# Patient Record
Sex: Male | Born: 1940 | Race: White | Hispanic: No | Smoking: Former smoker
Health system: Southern US, Community
[De-identification: ages and names within clinical notes are randomized; demographics above are authoritative.]

## PROBLEM LIST (undated history)

## (undated) DIAGNOSIS — I2510 Atherosclerotic heart disease of native coronary artery without angina pectoris: Secondary | ICD-10-CM

## (undated) DIAGNOSIS — G609 Hereditary and idiopathic neuropathy, unspecified: Secondary | ICD-10-CM

## (undated) DIAGNOSIS — G4700 Insomnia, unspecified: Secondary | ICD-10-CM

## (undated) DIAGNOSIS — I480 Paroxysmal atrial fibrillation: Secondary | ICD-10-CM

## (undated) DIAGNOSIS — N400 Enlarged prostate without lower urinary tract symptoms: Secondary | ICD-10-CM

## (undated) DIAGNOSIS — I119 Hypertensive heart disease without heart failure: Secondary | ICD-10-CM

## (undated) DIAGNOSIS — E559 Vitamin D deficiency, unspecified: Secondary | ICD-10-CM

## (undated) DIAGNOSIS — E785 Hyperlipidemia, unspecified: Secondary | ICD-10-CM

## (undated) DIAGNOSIS — I10 Essential (primary) hypertension: Secondary | ICD-10-CM

## (undated) HISTORY — DX: Essential (primary) hypertension: I10

## (undated) HISTORY — DX: Insomnia, unspecified: G47.00

## (undated) HISTORY — DX: Hereditary and idiopathic neuropathy, unspecified: G60.9

## (undated) HISTORY — DX: Hypertensive heart disease without heart failure: I11.9

## (undated) HISTORY — DX: Paroxysmal atrial fibrillation: I48.0

## (undated) HISTORY — PX: JOINT REPLACEMENT: SHX530

## (undated) HISTORY — DX: Atherosclerotic heart disease of native coronary artery without angina pectoris: I25.10

## (undated) HISTORY — DX: Hyperlipidemia, unspecified: E78.5

## (undated) HISTORY — DX: Vitamin D deficiency, unspecified: E55.9

## (undated) HISTORY — DX: Benign prostatic hyperplasia without lower urinary tract symptoms: N40.0

## (undated) HISTORY — PX: NECK SURGERY: SHX720

---

## 1998-05-02 ENCOUNTER — Inpatient Hospital Stay (HOSPITAL_COMMUNITY): Admission: EM | Admit: 1998-05-02 | Discharge: 1998-05-04 | Payer: Self-pay | Admitting: Emergency Medicine

## 2001-09-03 HISTORY — PX: CARDIAC CATHETERIZATION: SHX172

## 2009-04-26 ENCOUNTER — Emergency Department: Payer: Self-pay | Admitting: Emergency Medicine

## 2011-09-15 ENCOUNTER — Observation Stay: Payer: Self-pay | Admitting: Internal Medicine

## 2011-09-15 DIAGNOSIS — G609 Hereditary and idiopathic neuropathy, unspecified: Secondary | ICD-10-CM | POA: Diagnosis not present

## 2011-09-15 DIAGNOSIS — Z8249 Family history of ischemic heart disease and other diseases of the circulatory system: Secondary | ICD-10-CM | POA: Diagnosis not present

## 2011-09-15 DIAGNOSIS — S79919A Unspecified injury of unspecified hip, initial encounter: Secondary | ICD-10-CM | POA: Diagnosis not present

## 2011-09-15 DIAGNOSIS — R6889 Other general symptoms and signs: Secondary | ICD-10-CM | POA: Diagnosis not present

## 2011-09-15 DIAGNOSIS — E785 Hyperlipidemia, unspecified: Secondary | ICD-10-CM | POA: Diagnosis not present

## 2011-09-15 DIAGNOSIS — I2510 Atherosclerotic heart disease of native coronary artery without angina pectoris: Secondary | ICD-10-CM | POA: Diagnosis not present

## 2011-09-15 DIAGNOSIS — I10 Essential (primary) hypertension: Secondary | ICD-10-CM | POA: Diagnosis not present

## 2011-09-15 DIAGNOSIS — S7000XA Contusion of unspecified hip, initial encounter: Secondary | ICD-10-CM | POA: Diagnosis not present

## 2011-09-15 DIAGNOSIS — N400 Enlarged prostate without lower urinary tract symptoms: Secondary | ICD-10-CM | POA: Diagnosis not present

## 2011-09-15 DIAGNOSIS — S79929A Unspecified injury of unspecified thigh, initial encounter: Secondary | ICD-10-CM | POA: Diagnosis not present

## 2011-09-15 DIAGNOSIS — Z7902 Long term (current) use of antithrombotics/antiplatelets: Secondary | ICD-10-CM | POA: Diagnosis not present

## 2011-09-15 DIAGNOSIS — R7309 Other abnormal glucose: Secondary | ICD-10-CM | POA: Diagnosis not present

## 2011-09-15 DIAGNOSIS — M79609 Pain in unspecified limb: Secondary | ICD-10-CM | POA: Diagnosis not present

## 2011-09-15 DIAGNOSIS — Z888 Allergy status to other drugs, medicaments and biological substances status: Secondary | ICD-10-CM | POA: Diagnosis not present

## 2011-09-15 DIAGNOSIS — IMO0002 Reserved for concepts with insufficient information to code with codable children: Secondary | ICD-10-CM | POA: Diagnosis not present

## 2011-09-15 DIAGNOSIS — R079 Chest pain, unspecified: Secondary | ICD-10-CM | POA: Diagnosis not present

## 2011-09-15 DIAGNOSIS — Z9861 Coronary angioplasty status: Secondary | ICD-10-CM | POA: Diagnosis not present

## 2011-09-15 DIAGNOSIS — M25559 Pain in unspecified hip: Secondary | ICD-10-CM | POA: Diagnosis not present

## 2011-09-15 DIAGNOSIS — Z79899 Other long term (current) drug therapy: Secondary | ICD-10-CM | POA: Diagnosis not present

## 2011-09-15 LAB — URINALYSIS, COMPLETE
Bacteria: NONE SEEN
Glucose,UR: NEGATIVE mg/dL (ref 0–75)
Ketone: NEGATIVE
Nitrite: NEGATIVE
Protein: NEGATIVE
RBC,UR: 2 /HPF (ref 0–5)
Specific Gravity: 1.019 (ref 1.003–1.030)
Squamous Epithelial: NONE SEEN

## 2011-09-15 LAB — COMPREHENSIVE METABOLIC PANEL
Alkaline Phosphatase: 41 U/L — ABNORMAL LOW (ref 50–136)
Anion Gap: 10 (ref 7–16)
BUN: 18 mg/dL (ref 7–18)
Bilirubin,Total: 1 mg/dL (ref 0.2–1.0)
Creatinine: 1.16 mg/dL (ref 0.60–1.30)
EGFR (African American): >60
EGFR (Non-African Amer.): >60
Glucose: 101 mg/dL — ABNORMAL HIGH (ref 65–99)
Potassium: 4 mmol/L (ref 3.5–5.1)
SGOT(AST): 25 U/L (ref 15–37)
SGPT (ALT): 25 U/L
Sodium: 137 mmol/L (ref 136–145)
Total Protein: 7.8 g/dL (ref 6.4–8.2)

## 2011-09-15 LAB — CK TOTAL AND CKMB (NOT AT ARMC): CK, Total: 133 U/L (ref 35–232)

## 2011-09-15 LAB — CBC
HCT: 41 % (ref 40.0–52.0)
MCH: 32.8 pg (ref 26.0–34.0)
MCV: 96 fL (ref 80–100)
Platelet: 201 10*3/uL (ref 150–440)
RBC: 4.27 10*6/uL — ABNORMAL LOW (ref 4.40–5.90)

## 2011-09-15 LAB — TROPONIN I: Troponin-I: <0.02 ng/mL

## 2011-09-16 DIAGNOSIS — I2510 Atherosclerotic heart disease of native coronary artery without angina pectoris: Secondary | ICD-10-CM | POA: Diagnosis not present

## 2011-09-16 DIAGNOSIS — G609 Hereditary and idiopathic neuropathy, unspecified: Secondary | ICD-10-CM | POA: Diagnosis not present

## 2011-09-16 DIAGNOSIS — M25559 Pain in unspecified hip: Secondary | ICD-10-CM | POA: Diagnosis not present

## 2011-09-16 DIAGNOSIS — I10 Essential (primary) hypertension: Secondary | ICD-10-CM | POA: Diagnosis not present

## 2011-09-17 DIAGNOSIS — M25559 Pain in unspecified hip: Secondary | ICD-10-CM | POA: Diagnosis not present

## 2011-09-17 DIAGNOSIS — I10 Essential (primary) hypertension: Secondary | ICD-10-CM | POA: Diagnosis not present

## 2011-09-17 DIAGNOSIS — I2510 Atherosclerotic heart disease of native coronary artery without angina pectoris: Secondary | ICD-10-CM | POA: Diagnosis not present

## 2011-09-17 DIAGNOSIS — G609 Hereditary and idiopathic neuropathy, unspecified: Secondary | ICD-10-CM | POA: Diagnosis not present

## 2011-09-17 LAB — HEMOGLOBIN A1C: Hemoglobin A1C: 6 % (ref 4.2–6.3)

## 2011-09-17 LAB — HEMOGLOBIN: HGB: 13.2 g/dL (ref 13.0–18.0)

## 2011-09-20 DIAGNOSIS — Z9181 History of falling: Secondary | ICD-10-CM | POA: Diagnosis not present

## 2011-09-20 DIAGNOSIS — M6281 Muscle weakness (generalized): Secondary | ICD-10-CM | POA: Diagnosis not present

## 2011-09-20 DIAGNOSIS — M25559 Pain in unspecified hip: Secondary | ICD-10-CM | POA: Diagnosis not present

## 2011-09-20 DIAGNOSIS — I10 Essential (primary) hypertension: Secondary | ICD-10-CM | POA: Diagnosis not present

## 2011-09-20 DIAGNOSIS — G609 Hereditary and idiopathic neuropathy, unspecified: Secondary | ICD-10-CM | POA: Diagnosis not present

## 2011-09-20 DIAGNOSIS — K5900 Constipation, unspecified: Secondary | ICD-10-CM | POA: Diagnosis not present

## 2011-09-20 DIAGNOSIS — I2510 Atherosclerotic heart disease of native coronary artery without angina pectoris: Secondary | ICD-10-CM | POA: Diagnosis not present

## 2011-09-20 DIAGNOSIS — R269 Unspecified abnormalities of gait and mobility: Secondary | ICD-10-CM | POA: Diagnosis not present

## 2011-09-24 DIAGNOSIS — Z9181 History of falling: Secondary | ICD-10-CM | POA: Diagnosis not present

## 2011-09-24 DIAGNOSIS — I10 Essential (primary) hypertension: Secondary | ICD-10-CM | POA: Diagnosis not present

## 2011-09-24 DIAGNOSIS — I2510 Atherosclerotic heart disease of native coronary artery without angina pectoris: Secondary | ICD-10-CM | POA: Diagnosis not present

## 2011-09-24 DIAGNOSIS — R269 Unspecified abnormalities of gait and mobility: Secondary | ICD-10-CM | POA: Diagnosis not present

## 2011-09-24 DIAGNOSIS — M25559 Pain in unspecified hip: Secondary | ICD-10-CM | POA: Diagnosis not present

## 2011-09-24 DIAGNOSIS — G609 Hereditary and idiopathic neuropathy, unspecified: Secondary | ICD-10-CM | POA: Diagnosis not present

## 2011-09-25 DIAGNOSIS — Z9181 History of falling: Secondary | ICD-10-CM | POA: Diagnosis not present

## 2011-09-25 DIAGNOSIS — I2510 Atherosclerotic heart disease of native coronary artery without angina pectoris: Secondary | ICD-10-CM | POA: Diagnosis not present

## 2011-09-25 DIAGNOSIS — M79609 Pain in unspecified limb: Secondary | ICD-10-CM | POA: Diagnosis not present

## 2011-09-25 DIAGNOSIS — M25559 Pain in unspecified hip: Secondary | ICD-10-CM | POA: Diagnosis not present

## 2011-09-25 DIAGNOSIS — G609 Hereditary and idiopathic neuropathy, unspecified: Secondary | ICD-10-CM | POA: Diagnosis not present

## 2011-09-25 DIAGNOSIS — B351 Tinea unguium: Secondary | ICD-10-CM | POA: Diagnosis not present

## 2011-09-25 DIAGNOSIS — I10 Essential (primary) hypertension: Secondary | ICD-10-CM | POA: Diagnosis not present

## 2011-09-25 DIAGNOSIS — R269 Unspecified abnormalities of gait and mobility: Secondary | ICD-10-CM | POA: Diagnosis not present

## 2011-09-26 DIAGNOSIS — I10 Essential (primary) hypertension: Secondary | ICD-10-CM | POA: Diagnosis not present

## 2011-09-26 DIAGNOSIS — M25559 Pain in unspecified hip: Secondary | ICD-10-CM | POA: Diagnosis not present

## 2011-09-26 DIAGNOSIS — Z9181 History of falling: Secondary | ICD-10-CM | POA: Diagnosis not present

## 2011-09-26 DIAGNOSIS — R269 Unspecified abnormalities of gait and mobility: Secondary | ICD-10-CM | POA: Diagnosis not present

## 2011-09-26 DIAGNOSIS — G609 Hereditary and idiopathic neuropathy, unspecified: Secondary | ICD-10-CM | POA: Diagnosis not present

## 2011-09-26 DIAGNOSIS — I2510 Atherosclerotic heart disease of native coronary artery without angina pectoris: Secondary | ICD-10-CM | POA: Diagnosis not present

## 2011-09-27 DIAGNOSIS — G609 Hereditary and idiopathic neuropathy, unspecified: Secondary | ICD-10-CM | POA: Diagnosis not present

## 2011-09-27 DIAGNOSIS — I10 Essential (primary) hypertension: Secondary | ICD-10-CM | POA: Diagnosis not present

## 2011-09-27 DIAGNOSIS — I2510 Atherosclerotic heart disease of native coronary artery without angina pectoris: Secondary | ICD-10-CM | POA: Diagnosis not present

## 2011-09-27 DIAGNOSIS — R269 Unspecified abnormalities of gait and mobility: Secondary | ICD-10-CM | POA: Diagnosis not present

## 2011-09-27 DIAGNOSIS — Z9181 History of falling: Secondary | ICD-10-CM | POA: Diagnosis not present

## 2011-09-27 DIAGNOSIS — M25559 Pain in unspecified hip: Secondary | ICD-10-CM | POA: Diagnosis not present

## 2011-09-30 DIAGNOSIS — I2510 Atherosclerotic heart disease of native coronary artery without angina pectoris: Secondary | ICD-10-CM | POA: Diagnosis not present

## 2011-09-30 DIAGNOSIS — I10 Essential (primary) hypertension: Secondary | ICD-10-CM | POA: Diagnosis not present

## 2011-09-30 DIAGNOSIS — M25559 Pain in unspecified hip: Secondary | ICD-10-CM | POA: Diagnosis not present

## 2011-09-30 DIAGNOSIS — Z9181 History of falling: Secondary | ICD-10-CM | POA: Diagnosis not present

## 2011-09-30 DIAGNOSIS — R269 Unspecified abnormalities of gait and mobility: Secondary | ICD-10-CM | POA: Diagnosis not present

## 2011-09-30 DIAGNOSIS — G609 Hereditary and idiopathic neuropathy, unspecified: Secondary | ICD-10-CM | POA: Diagnosis not present

## 2011-10-01 DIAGNOSIS — G609 Hereditary and idiopathic neuropathy, unspecified: Secondary | ICD-10-CM | POA: Diagnosis not present

## 2011-10-01 DIAGNOSIS — R269 Unspecified abnormalities of gait and mobility: Secondary | ICD-10-CM | POA: Diagnosis not present

## 2011-10-01 DIAGNOSIS — M25559 Pain in unspecified hip: Secondary | ICD-10-CM | POA: Diagnosis not present

## 2011-10-01 DIAGNOSIS — I2510 Atherosclerotic heart disease of native coronary artery without angina pectoris: Secondary | ICD-10-CM | POA: Diagnosis not present

## 2011-10-01 DIAGNOSIS — Z9181 History of falling: Secondary | ICD-10-CM | POA: Diagnosis not present

## 2011-10-01 DIAGNOSIS — I10 Essential (primary) hypertension: Secondary | ICD-10-CM | POA: Diagnosis not present

## 2011-10-02 DIAGNOSIS — I2510 Atherosclerotic heart disease of native coronary artery without angina pectoris: Secondary | ICD-10-CM | POA: Diagnosis not present

## 2011-10-02 DIAGNOSIS — M25559 Pain in unspecified hip: Secondary | ICD-10-CM | POA: Diagnosis not present

## 2011-10-02 DIAGNOSIS — Z9181 History of falling: Secondary | ICD-10-CM | POA: Diagnosis not present

## 2011-10-02 DIAGNOSIS — R269 Unspecified abnormalities of gait and mobility: Secondary | ICD-10-CM | POA: Diagnosis not present

## 2011-10-02 DIAGNOSIS — I10 Essential (primary) hypertension: Secondary | ICD-10-CM | POA: Diagnosis not present

## 2011-10-02 DIAGNOSIS — G609 Hereditary and idiopathic neuropathy, unspecified: Secondary | ICD-10-CM | POA: Diagnosis not present

## 2011-10-04 DIAGNOSIS — G609 Hereditary and idiopathic neuropathy, unspecified: Secondary | ICD-10-CM | POA: Diagnosis not present

## 2011-10-04 DIAGNOSIS — R269 Unspecified abnormalities of gait and mobility: Secondary | ICD-10-CM | POA: Diagnosis not present

## 2011-10-04 DIAGNOSIS — I10 Essential (primary) hypertension: Secondary | ICD-10-CM | POA: Diagnosis not present

## 2011-10-04 DIAGNOSIS — M25559 Pain in unspecified hip: Secondary | ICD-10-CM | POA: Diagnosis not present

## 2011-10-04 DIAGNOSIS — Z9181 History of falling: Secondary | ICD-10-CM | POA: Diagnosis not present

## 2011-10-04 DIAGNOSIS — I2510 Atherosclerotic heart disease of native coronary artery without angina pectoris: Secondary | ICD-10-CM | POA: Diagnosis not present

## 2011-10-08 DIAGNOSIS — R269 Unspecified abnormalities of gait and mobility: Secondary | ICD-10-CM | POA: Diagnosis not present

## 2011-10-08 DIAGNOSIS — I10 Essential (primary) hypertension: Secondary | ICD-10-CM | POA: Diagnosis not present

## 2011-10-08 DIAGNOSIS — I2510 Atherosclerotic heart disease of native coronary artery without angina pectoris: Secondary | ICD-10-CM | POA: Diagnosis not present

## 2011-10-08 DIAGNOSIS — Z9181 History of falling: Secondary | ICD-10-CM | POA: Diagnosis not present

## 2011-10-08 DIAGNOSIS — G609 Hereditary and idiopathic neuropathy, unspecified: Secondary | ICD-10-CM | POA: Diagnosis not present

## 2011-10-08 DIAGNOSIS — M25559 Pain in unspecified hip: Secondary | ICD-10-CM | POA: Diagnosis not present

## 2011-10-10 DIAGNOSIS — I2510 Atherosclerotic heart disease of native coronary artery without angina pectoris: Secondary | ICD-10-CM | POA: Diagnosis not present

## 2011-10-10 DIAGNOSIS — G609 Hereditary and idiopathic neuropathy, unspecified: Secondary | ICD-10-CM | POA: Diagnosis not present

## 2011-10-10 DIAGNOSIS — M25559 Pain in unspecified hip: Secondary | ICD-10-CM | POA: Diagnosis not present

## 2011-10-10 DIAGNOSIS — Z9181 History of falling: Secondary | ICD-10-CM | POA: Diagnosis not present

## 2011-10-10 DIAGNOSIS — R269 Unspecified abnormalities of gait and mobility: Secondary | ICD-10-CM | POA: Diagnosis not present

## 2011-10-10 DIAGNOSIS — I10 Essential (primary) hypertension: Secondary | ICD-10-CM | POA: Diagnosis not present

## 2011-10-11 DIAGNOSIS — M25559 Pain in unspecified hip: Secondary | ICD-10-CM | POA: Diagnosis not present

## 2011-10-11 DIAGNOSIS — G609 Hereditary and idiopathic neuropathy, unspecified: Secondary | ICD-10-CM | POA: Diagnosis not present

## 2011-10-11 DIAGNOSIS — I2510 Atherosclerotic heart disease of native coronary artery without angina pectoris: Secondary | ICD-10-CM | POA: Diagnosis not present

## 2011-10-11 DIAGNOSIS — I10 Essential (primary) hypertension: Secondary | ICD-10-CM | POA: Diagnosis not present

## 2011-10-11 DIAGNOSIS — R269 Unspecified abnormalities of gait and mobility: Secondary | ICD-10-CM | POA: Diagnosis not present

## 2011-10-11 DIAGNOSIS — Z9181 History of falling: Secondary | ICD-10-CM | POA: Diagnosis not present

## 2011-10-16 DIAGNOSIS — R269 Unspecified abnormalities of gait and mobility: Secondary | ICD-10-CM | POA: Diagnosis not present

## 2011-10-16 DIAGNOSIS — G609 Hereditary and idiopathic neuropathy, unspecified: Secondary | ICD-10-CM | POA: Diagnosis not present

## 2011-10-16 DIAGNOSIS — M25559 Pain in unspecified hip: Secondary | ICD-10-CM | POA: Diagnosis not present

## 2011-10-16 DIAGNOSIS — I2510 Atherosclerotic heart disease of native coronary artery without angina pectoris: Secondary | ICD-10-CM | POA: Diagnosis not present

## 2011-10-16 DIAGNOSIS — I10 Essential (primary) hypertension: Secondary | ICD-10-CM | POA: Diagnosis not present

## 2011-10-16 DIAGNOSIS — Z9181 History of falling: Secondary | ICD-10-CM | POA: Diagnosis not present

## 2011-10-18 DIAGNOSIS — Z9181 History of falling: Secondary | ICD-10-CM | POA: Diagnosis not present

## 2011-10-18 DIAGNOSIS — I2510 Atherosclerotic heart disease of native coronary artery without angina pectoris: Secondary | ICD-10-CM | POA: Diagnosis not present

## 2011-10-18 DIAGNOSIS — R269 Unspecified abnormalities of gait and mobility: Secondary | ICD-10-CM | POA: Diagnosis not present

## 2011-10-18 DIAGNOSIS — M25559 Pain in unspecified hip: Secondary | ICD-10-CM | POA: Diagnosis not present

## 2011-10-18 DIAGNOSIS — I10 Essential (primary) hypertension: Secondary | ICD-10-CM | POA: Diagnosis not present

## 2011-10-18 DIAGNOSIS — G609 Hereditary and idiopathic neuropathy, unspecified: Secondary | ICD-10-CM | POA: Diagnosis not present

## 2011-10-23 DIAGNOSIS — Z9181 History of falling: Secondary | ICD-10-CM | POA: Diagnosis not present

## 2011-10-23 DIAGNOSIS — G609 Hereditary and idiopathic neuropathy, unspecified: Secondary | ICD-10-CM | POA: Diagnosis not present

## 2011-10-23 DIAGNOSIS — R269 Unspecified abnormalities of gait and mobility: Secondary | ICD-10-CM | POA: Diagnosis not present

## 2011-10-23 DIAGNOSIS — I10 Essential (primary) hypertension: Secondary | ICD-10-CM | POA: Diagnosis not present

## 2011-10-23 DIAGNOSIS — I2510 Atherosclerotic heart disease of native coronary artery without angina pectoris: Secondary | ICD-10-CM | POA: Diagnosis not present

## 2011-10-23 DIAGNOSIS — M25559 Pain in unspecified hip: Secondary | ICD-10-CM | POA: Diagnosis not present

## 2011-10-24 DIAGNOSIS — I10 Essential (primary) hypertension: Secondary | ICD-10-CM | POA: Diagnosis not present

## 2011-10-24 DIAGNOSIS — G609 Hereditary and idiopathic neuropathy, unspecified: Secondary | ICD-10-CM | POA: Diagnosis not present

## 2011-10-24 DIAGNOSIS — M25559 Pain in unspecified hip: Secondary | ICD-10-CM | POA: Diagnosis not present

## 2011-10-24 DIAGNOSIS — I2510 Atherosclerotic heart disease of native coronary artery without angina pectoris: Secondary | ICD-10-CM | POA: Diagnosis not present

## 2011-10-24 DIAGNOSIS — R269 Unspecified abnormalities of gait and mobility: Secondary | ICD-10-CM | POA: Diagnosis not present

## 2011-10-24 DIAGNOSIS — Z9181 History of falling: Secondary | ICD-10-CM | POA: Diagnosis not present

## 2011-10-30 DIAGNOSIS — Z9181 History of falling: Secondary | ICD-10-CM | POA: Diagnosis not present

## 2011-10-30 DIAGNOSIS — M25559 Pain in unspecified hip: Secondary | ICD-10-CM | POA: Diagnosis not present

## 2011-10-30 DIAGNOSIS — I10 Essential (primary) hypertension: Secondary | ICD-10-CM | POA: Diagnosis not present

## 2011-10-30 DIAGNOSIS — I2510 Atherosclerotic heart disease of native coronary artery without angina pectoris: Secondary | ICD-10-CM | POA: Diagnosis not present

## 2011-10-30 DIAGNOSIS — R269 Unspecified abnormalities of gait and mobility: Secondary | ICD-10-CM | POA: Diagnosis not present

## 2011-10-30 DIAGNOSIS — G609 Hereditary and idiopathic neuropathy, unspecified: Secondary | ICD-10-CM | POA: Diagnosis not present

## 2011-10-31 DIAGNOSIS — M25559 Pain in unspecified hip: Secondary | ICD-10-CM | POA: Diagnosis not present

## 2011-10-31 DIAGNOSIS — I2510 Atherosclerotic heart disease of native coronary artery without angina pectoris: Secondary | ICD-10-CM | POA: Diagnosis not present

## 2011-10-31 DIAGNOSIS — Z9181 History of falling: Secondary | ICD-10-CM | POA: Diagnosis not present

## 2011-10-31 DIAGNOSIS — G609 Hereditary and idiopathic neuropathy, unspecified: Secondary | ICD-10-CM | POA: Diagnosis not present

## 2011-10-31 DIAGNOSIS — R269 Unspecified abnormalities of gait and mobility: Secondary | ICD-10-CM | POA: Diagnosis not present

## 2011-10-31 DIAGNOSIS — I10 Essential (primary) hypertension: Secondary | ICD-10-CM | POA: Diagnosis not present

## 2011-11-01 DIAGNOSIS — G609 Hereditary and idiopathic neuropathy, unspecified: Secondary | ICD-10-CM | POA: Diagnosis not present

## 2011-11-01 DIAGNOSIS — R269 Unspecified abnormalities of gait and mobility: Secondary | ICD-10-CM | POA: Diagnosis not present

## 2011-11-01 DIAGNOSIS — I10 Essential (primary) hypertension: Secondary | ICD-10-CM | POA: Diagnosis not present

## 2011-11-01 DIAGNOSIS — M25559 Pain in unspecified hip: Secondary | ICD-10-CM | POA: Diagnosis not present

## 2011-11-01 DIAGNOSIS — Z9181 History of falling: Secondary | ICD-10-CM | POA: Diagnosis not present

## 2011-11-01 DIAGNOSIS — I2510 Atherosclerotic heart disease of native coronary artery without angina pectoris: Secondary | ICD-10-CM | POA: Diagnosis not present

## 2011-11-06 DIAGNOSIS — M25559 Pain in unspecified hip: Secondary | ICD-10-CM | POA: Diagnosis not present

## 2011-11-06 DIAGNOSIS — G609 Hereditary and idiopathic neuropathy, unspecified: Secondary | ICD-10-CM | POA: Diagnosis not present

## 2011-11-06 DIAGNOSIS — Z9181 History of falling: Secondary | ICD-10-CM | POA: Diagnosis not present

## 2011-11-06 DIAGNOSIS — I10 Essential (primary) hypertension: Secondary | ICD-10-CM | POA: Diagnosis not present

## 2011-11-06 DIAGNOSIS — I2510 Atherosclerotic heart disease of native coronary artery without angina pectoris: Secondary | ICD-10-CM | POA: Diagnosis not present

## 2011-11-06 DIAGNOSIS — R269 Unspecified abnormalities of gait and mobility: Secondary | ICD-10-CM | POA: Diagnosis not present

## 2011-11-07 DIAGNOSIS — I2510 Atherosclerotic heart disease of native coronary artery without angina pectoris: Secondary | ICD-10-CM | POA: Diagnosis not present

## 2011-11-07 DIAGNOSIS — G609 Hereditary and idiopathic neuropathy, unspecified: Secondary | ICD-10-CM | POA: Diagnosis not present

## 2011-11-07 DIAGNOSIS — Z9181 History of falling: Secondary | ICD-10-CM | POA: Diagnosis not present

## 2011-11-07 DIAGNOSIS — R269 Unspecified abnormalities of gait and mobility: Secondary | ICD-10-CM | POA: Diagnosis not present

## 2011-11-07 DIAGNOSIS — M25559 Pain in unspecified hip: Secondary | ICD-10-CM | POA: Diagnosis not present

## 2011-11-07 DIAGNOSIS — I10 Essential (primary) hypertension: Secondary | ICD-10-CM | POA: Diagnosis not present

## 2011-11-12 DIAGNOSIS — I10 Essential (primary) hypertension: Secondary | ICD-10-CM | POA: Diagnosis not present

## 2011-11-12 DIAGNOSIS — Z9181 History of falling: Secondary | ICD-10-CM | POA: Diagnosis not present

## 2011-11-12 DIAGNOSIS — G609 Hereditary and idiopathic neuropathy, unspecified: Secondary | ICD-10-CM | POA: Diagnosis not present

## 2011-11-12 DIAGNOSIS — R269 Unspecified abnormalities of gait and mobility: Secondary | ICD-10-CM | POA: Diagnosis not present

## 2011-11-12 DIAGNOSIS — M25559 Pain in unspecified hip: Secondary | ICD-10-CM | POA: Diagnosis not present

## 2011-11-12 DIAGNOSIS — I2510 Atherosclerotic heart disease of native coronary artery without angina pectoris: Secondary | ICD-10-CM | POA: Diagnosis not present

## 2011-11-13 DIAGNOSIS — Z9181 History of falling: Secondary | ICD-10-CM | POA: Diagnosis not present

## 2011-11-13 DIAGNOSIS — M25559 Pain in unspecified hip: Secondary | ICD-10-CM | POA: Diagnosis not present

## 2011-11-13 DIAGNOSIS — G609 Hereditary and idiopathic neuropathy, unspecified: Secondary | ICD-10-CM | POA: Diagnosis not present

## 2011-11-13 DIAGNOSIS — I2510 Atherosclerotic heart disease of native coronary artery without angina pectoris: Secondary | ICD-10-CM | POA: Diagnosis not present

## 2011-11-13 DIAGNOSIS — R269 Unspecified abnormalities of gait and mobility: Secondary | ICD-10-CM | POA: Diagnosis not present

## 2011-11-13 DIAGNOSIS — I10 Essential (primary) hypertension: Secondary | ICD-10-CM | POA: Diagnosis not present

## 2011-11-15 DIAGNOSIS — G609 Hereditary and idiopathic neuropathy, unspecified: Secondary | ICD-10-CM | POA: Diagnosis not present

## 2011-11-15 DIAGNOSIS — I10 Essential (primary) hypertension: Secondary | ICD-10-CM | POA: Diagnosis not present

## 2011-11-15 DIAGNOSIS — M25559 Pain in unspecified hip: Secondary | ICD-10-CM | POA: Diagnosis not present

## 2011-11-15 DIAGNOSIS — I2510 Atherosclerotic heart disease of native coronary artery without angina pectoris: Secondary | ICD-10-CM | POA: Diagnosis not present

## 2011-11-15 DIAGNOSIS — R269 Unspecified abnormalities of gait and mobility: Secondary | ICD-10-CM | POA: Diagnosis not present

## 2011-11-15 DIAGNOSIS — Z9181 History of falling: Secondary | ICD-10-CM | POA: Diagnosis not present

## 2012-05-08 DIAGNOSIS — B351 Tinea unguium: Secondary | ICD-10-CM | POA: Diagnosis not present

## 2012-05-08 DIAGNOSIS — M79609 Pain in unspecified limb: Secondary | ICD-10-CM | POA: Diagnosis not present

## 2012-11-06 DIAGNOSIS — M79609 Pain in unspecified limb: Secondary | ICD-10-CM | POA: Diagnosis not present

## 2012-11-06 DIAGNOSIS — B351 Tinea unguium: Secondary | ICD-10-CM | POA: Diagnosis not present

## 2012-11-11 DIAGNOSIS — R079 Chest pain, unspecified: Secondary | ICD-10-CM | POA: Diagnosis not present

## 2012-11-11 DIAGNOSIS — R6889 Other general symptoms and signs: Secondary | ICD-10-CM | POA: Diagnosis not present

## 2012-11-11 LAB — COMPREHENSIVE METABOLIC PANEL
Albumin: 3.2 g/dL — ABNORMAL LOW (ref 3.4–5.0)
BUN: 29 mg/dL — ABNORMAL HIGH (ref 7–18)
Co2: 24 mmol/L (ref 21–32)
Creatinine: 1.34 mg/dL — ABNORMAL HIGH (ref 0.60–1.30)
EGFR (African American): >60
EGFR (Non-African Amer.): 53 — ABNORMAL LOW
SGOT(AST): 26 U/L (ref 15–37)
SGPT (ALT): 27 U/L (ref 12–78)
Sodium: 138 mmol/L (ref 136–145)
Total Protein: 7.4 g/dL (ref 6.4–8.2)

## 2012-11-11 LAB — CBC
HGB: 13 g/dL (ref 13.0–18.0)
MCH: 31.5 pg (ref 26.0–34.0)
MCHC: 34.1 g/dL (ref 32.0–36.0)
Platelet: 212 10*3/uL (ref 150–440)
WBC: 7.1 10*3/uL (ref 3.8–10.6)

## 2012-11-11 LAB — CK TOTAL AND CKMB (NOT AT ARMC)
CK, Total: 280 U/L — ABNORMAL HIGH (ref 35–232)
CK-MB: 4.3 ng/mL — ABNORMAL HIGH (ref 0.5–3.6)
CK-MB: 3.1 ng/mL (ref 0.5–3.6)

## 2012-11-11 LAB — TROPONIN I
Troponin-I: <0.02 ng/mL
Troponin-I: <0.02 ng/mL

## 2012-11-11 LAB — PROTIME-INR: Prothrombin Time: 13.6 secs (ref 11.5–14.7)

## 2012-11-11 LAB — APTT: Activated PTT: 32 secs (ref 23.6–35.9)

## 2012-11-12 ENCOUNTER — Inpatient Hospital Stay: Payer: Self-pay | Admitting: Internal Medicine

## 2012-11-12 DIAGNOSIS — R9431 Abnormal electrocardiogram [ECG] [EKG]: Secondary | ICD-10-CM | POA: Diagnosis not present

## 2012-11-12 DIAGNOSIS — I200 Unstable angina: Secondary | ICD-10-CM | POA: Diagnosis not present

## 2012-11-12 DIAGNOSIS — R079 Chest pain, unspecified: Secondary | ICD-10-CM

## 2012-11-12 DIAGNOSIS — Z7902 Long term (current) use of antithrombotics/antiplatelets: Secondary | ICD-10-CM | POA: Diagnosis not present

## 2012-11-12 DIAGNOSIS — Z6830 Body mass index (BMI) 30.0-30.9, adult: Secondary | ICD-10-CM | POA: Diagnosis not present

## 2012-11-12 DIAGNOSIS — Z886 Allergy status to analgesic agent status: Secondary | ICD-10-CM | POA: Diagnosis not present

## 2012-11-12 DIAGNOSIS — Z79899 Other long term (current) drug therapy: Secondary | ICD-10-CM | POA: Diagnosis not present

## 2012-11-12 DIAGNOSIS — Z8249 Family history of ischemic heart disease and other diseases of the circulatory system: Secondary | ICD-10-CM | POA: Diagnosis not present

## 2012-11-12 DIAGNOSIS — N400 Enlarged prostate without lower urinary tract symptoms: Secondary | ICD-10-CM | POA: Diagnosis present

## 2012-11-12 DIAGNOSIS — I2510 Atherosclerotic heart disease of native coronary artery without angina pectoris: Secondary | ICD-10-CM | POA: Diagnosis not present

## 2012-11-12 DIAGNOSIS — E785 Hyperlipidemia, unspecified: Secondary | ICD-10-CM | POA: Diagnosis not present

## 2012-11-12 DIAGNOSIS — I10 Essential (primary) hypertension: Secondary | ICD-10-CM | POA: Diagnosis not present

## 2012-11-13 ENCOUNTER — Encounter: Payer: Self-pay | Admitting: Cardiovascular Disease

## 2012-11-13 DIAGNOSIS — I2510 Atherosclerotic heart disease of native coronary artery without angina pectoris: Secondary | ICD-10-CM

## 2012-11-13 HISTORY — PX: CARDIAC CATHETERIZATION: SHX172

## 2012-11-13 LAB — BASIC METABOLIC PANEL
BUN: 24 mg/dL — ABNORMAL HIGH (ref 7–18)
Calcium, Total: 8.9 mg/dL (ref 8.5–10.1)
Chloride: 102 mmol/L (ref 98–107)
Creatinine: 1.36 mg/dL — ABNORMAL HIGH (ref 0.60–1.30)
Glucose: 100 mg/dL — ABNORMAL HIGH (ref 65–99)

## 2012-11-13 LAB — CBC WITH DIFFERENTIAL/PLATELET
Basophil %: 0.6 %
HGB: 13.5 g/dL (ref 13.0–18.0)
Lymphocyte %: 28.2 %
MCH: 30.9 pg (ref 26.0–34.0)
Neutrophil #: 5.4 10*3/uL (ref 1.4–6.5)
Neutrophil %: 60 %
WBC: 9 10*3/uL (ref 3.8–10.6)

## 2012-11-25 ENCOUNTER — Encounter: Payer: Self-pay | Admitting: *Deleted

## 2012-12-04 ENCOUNTER — Encounter: Payer: Self-pay | Admitting: Cardiovascular Disease

## 2012-12-04 ENCOUNTER — Ambulatory Visit (INDEPENDENT_AMBULATORY_CARE_PROVIDER_SITE_OTHER): Payer: Medicare Other | Admitting: Cardiovascular Disease

## 2012-12-04 VITALS — BP 167/76 | HR 75 | Ht 70.0 in | Wt 221.2 lb

## 2012-12-04 DIAGNOSIS — E785 Hyperlipidemia, unspecified: Secondary | ICD-10-CM | POA: Diagnosis not present

## 2012-12-04 DIAGNOSIS — I2510 Atherosclerotic heart disease of native coronary artery without angina pectoris: Secondary | ICD-10-CM | POA: Insufficient documentation

## 2012-12-04 DIAGNOSIS — I10 Essential (primary) hypertension: Secondary | ICD-10-CM

## 2012-12-04 NOTE — Assessment & Plan Note (Signed)
Recent cardiac catheterization showed no evidence of obstructive coronary artery disease. The images were reviewed. He does have mild stenosis in the proximal LAD. He continues to have significant inferior T wave changes but reports no further chest pain. It's possible that he might have small vessel disease. I recommend continuing medical therapy. He is on lifelong treatment with Plavix as he is allergic to aspirin.

## 2012-12-04 NOTE — Patient Instructions (Addendum)
Continue same medication.  Follow up in 6 months.

## 2012-12-04 NOTE — Assessment & Plan Note (Signed)
His blood pressure is elevated today but she reports that her readings at the assisted-living facility. Would continue to monitor and consider increasing the dose of lisinopril and/or hydrochlorothiazide.

## 2012-12-04 NOTE — Assessment & Plan Note (Signed)
Continue treatment with simvastatin. He gets all his medications on labs done at the [facility] in [place].

## 2012-12-04 NOTE — Progress Notes (Signed)
HPI  This is a 72 year old man who is here today for followup visit after recent hospitalization at [facility]. He has known history of coronary artery disease status post stenting of the ramus artery in 2003. He also has known history of hypertension and hyperlipidemia. He lives in [facility] assisted [facility] facility and has known history of back pain with previous neck surgery. He has significant neuropathy and balance problems and thus he uses a walker. He presented recently with chest pain associated with T wave changes in the anterior leads. He ruled out for myocardial infarction. He underwent cardiac catheterization by Dr.Gollan which showed no evidence of obstructive coronary artery disease. The stent in the ramus was patent with mild instent restenosis. There was a mild stenosis in the proximal LAD and ejection fraction was normal. Since hospital discharge, the patient has not had any further chest pain. He denies any dyspnea or palpitations.  Allergies  Allergen Reactions  . Aspirin      Current Outpatient Prescriptions on File Prior to Visit  Medication Sig Dispense Refill  . acetaminophen (TYLENOL) 325 MG tablet Take 650 mg by mouth every 6 (six) hours as needed for pain.      . calcium carbonate (OS-CAL) 600 MG TABS Take 600 mg by mouth daily.      . cholecalciferol (VITAMIN D) 1000 UNITS tablet Take 1,000 Units by mouth daily.      . clopidogrel (PLAVIX) 75 MG tablet Take 75 mg by mouth daily.      . finasteride (PROSCAR) 5 MG tablet Take 5 mg by mouth daily.      Marland Kitchen gabapentin (NEURONTIN) 300 MG capsule Take 300 mg by mouth 3 (three) times daily.      . hydrochlorothiazide (HYDRODIURIL) 12.5 MG tablet Take 12.5 mg by mouth daily.      Marland Kitchen lisinopril (PRINIVIL,ZESTRIL) 20 MG tablet Take 20 mg by mouth daily.      . metoprolol tartrate (LOPRESSOR) 25 MG tablet Take 25 mg by mouth 2 (two) times daily.      . nitroGLYCERIN (NITROSTAT) 0.4 MG SL tablet Place 0.4 mg under the tongue every 5  (five) minutes as needed for chest pain.      Marland Kitchen ondansetron (ZOFRAN) 4 MG tablet Take 4 mg by mouth as needed for nausea.      Marland Kitchen oxycodone (OXY-IR) 5 MG capsule Take 5 mg by mouth every 6 (six) hours as needed.      . SENNA CO by Combination route. Takes 2 tablets twice daily.      . simvastatin (ZOCOR) 80 MG tablet Take 40 mg by mouth at bedtime.      . tamsulosin (FLOMAX) 0.4 MG CAPS Take 0.4 mg by mouth daily.       No current facility-administered medications on file prior to visit.     Past Medical History  Diagnosis Date  . Coronary artery disease     Stent in the proximal ramus artery in 2003. He presented in March of 2014 with possible unstable angina. Cardiac catheterization showed no evidence of obstructive coronary artery disease with only mild instent restenosis. There was 30% stenosis in the mid LAD. Ejection fraction was normal.  . Hypertension   . Hyperlipidemia      Past Surgical History  Procedure Laterality Date  . Neck surgery    . Cardiac catheterization  11/13/12    [facility]; EF >55%  . Cardiac catheterization  2003     History reviewed. No pertinent family  history.   History   Social History  . Marital Status: Married    Spouse Name: N/A    Number of Children: N/A  . Years of Education: N/A   Occupational History  . Not on file.   Social History Main Topics  . Smoking status: Never Smoker   . Smokeless tobacco: Not on file  . Alcohol Use: No  . Drug Use: No  . Sexually Active: Not on file   Other Topics Concern  . Not on file   Social History Narrative  . No narrative on file     PHYSICAL EXAM   BP 167/76  Pulse 75  Ht 5\' 10"  (1.778 m)  Wt 221 lb 4 oz (100.358 kg)  BMI 31.75 kg/m2 Constitutional: He is oriented to person, place, and time. He appears well-developed and well-nourished. No distress.  HENT: No nasal discharge.  Head: Normocephalic and atraumatic.  Eyes: Pupils are equal and round. Right eye exhibits no discharge. Left  eye exhibits no discharge.  Neck: Normal range of motion. Neck supple. No JVD present. No thyromegaly present.  Cardiovascular: Normal rate, regular rhythm, normal heart sounds and. Exam reveals no gallop and no friction rub. No murmur heard.  Pulmonary/Chest: Effort normal and breath sounds normal. No stridor. No respiratory distress. He has no wheezes. He has no rales. He exhibits no tenderness.  Abdominal: Soft. Bowel sounds are normal. He exhibits no distension. There is no tenderness. There is no rebound and no guarding.  Musculoskeletal: Normal range of motion. He exhibits no edema and no tenderness.  Neurological: He is alert and oriented to person, place, and time. Coordination normal.  Skin: Skin is warm and dry. No rash noted. He is not diaphoretic. No erythema. No pallor.  Psychiatric: He has a normal mood and affect. His behavior is normal. Judgment and thought content normal.       [id number]  Rhythm  -Nonspecific ST depression   +   T-abnormality  -Nondiagnostic  -Possible  Anterior/lateral  ischemia.   ABNORMAL    ASSESSMENT AND PLAN

## 2013-06-16 DIAGNOSIS — B351 Tinea unguium: Secondary | ICD-10-CM | POA: Diagnosis not present

## 2013-06-16 DIAGNOSIS — M79609 Pain in unspecified limb: Secondary | ICD-10-CM | POA: Diagnosis not present

## 2013-06-30 DIAGNOSIS — Z23 Encounter for immunization: Secondary | ICD-10-CM | POA: Diagnosis not present

## 2013-08-28 DIAGNOSIS — I4891 Unspecified atrial fibrillation: Secondary | ICD-10-CM | POA: Diagnosis not present

## 2013-08-28 DIAGNOSIS — M25559 Pain in unspecified hip: Secondary | ICD-10-CM | POA: Diagnosis not present

## 2013-08-28 DIAGNOSIS — R55 Syncope and collapse: Secondary | ICD-10-CM | POA: Diagnosis not present

## 2013-08-28 DIAGNOSIS — S79919A Unspecified injury of unspecified hip, initial encounter: Secondary | ICD-10-CM | POA: Diagnosis not present

## 2013-08-28 DIAGNOSIS — S7000XA Contusion of unspecified hip, initial encounter: Secondary | ICD-10-CM | POA: Diagnosis not present

## 2013-08-28 LAB — COMPREHENSIVE METABOLIC PANEL
Anion Gap: 8 (ref 7–16)
Bilirubin,Total: 0.6 mg/dL (ref 0.2–1.0)
Calcium, Total: 9.1 mg/dL (ref 8.5–10.1)
Chloride: 104 mmol/L (ref 98–107)
Creatinine: 1.37 mg/dL — ABNORMAL HIGH (ref 0.60–1.30)
EGFR (African American): 59 — ABNORMAL LOW
Glucose: 133 mg/dL — ABNORMAL HIGH (ref 65–99)
Osmolality: 273 (ref 275–301)
SGOT(AST): 24 U/L (ref 15–37)
SGPT (ALT): 26 U/L (ref 12–78)
Total Protein: 7.8 g/dL (ref 6.4–8.2)

## 2013-08-28 LAB — CBC
HCT: 42.1 % (ref 40.0–52.0)
MCH: 32 pg (ref 26.0–34.0)
Platelet: 183 10*3/uL (ref 150–440)
RBC: 4.49 10*6/uL (ref 4.40–5.90)
RDW: 13.8 % (ref 11.5–14.5)

## 2013-08-28 LAB — TROPONIN I: Troponin-I: <0.02 ng/mL

## 2013-08-29 ENCOUNTER — Inpatient Hospital Stay: Payer: Self-pay | Admitting: Internal Medicine

## 2013-08-29 DIAGNOSIS — M25559 Pain in unspecified hip: Secondary | ICD-10-CM | POA: Diagnosis not present

## 2013-08-29 DIAGNOSIS — I658 Occlusion and stenosis of other precerebral arteries: Secondary | ICD-10-CM | POA: Diagnosis not present

## 2013-08-29 DIAGNOSIS — J9819 Other pulmonary collapse: Secondary | ICD-10-CM | POA: Diagnosis not present

## 2013-08-29 DIAGNOSIS — I10 Essential (primary) hypertension: Secondary | ICD-10-CM | POA: Diagnosis not present

## 2013-08-29 DIAGNOSIS — Z87891 Personal history of nicotine dependence: Secondary | ICD-10-CM | POA: Diagnosis not present

## 2013-08-29 DIAGNOSIS — R6510 Systemic inflammatory response syndrome (SIRS) of non-infectious origin without acute organ dysfunction: Secondary | ICD-10-CM | POA: Diagnosis not present

## 2013-08-29 DIAGNOSIS — Z7902 Long term (current) use of antithrombotics/antiplatelets: Secondary | ICD-10-CM | POA: Diagnosis not present

## 2013-08-29 DIAGNOSIS — J111 Influenza due to unidentified influenza virus with other respiratory manifestations: Secondary | ICD-10-CM | POA: Diagnosis not present

## 2013-08-29 DIAGNOSIS — R42 Dizziness and giddiness: Secondary | ICD-10-CM | POA: Diagnosis not present

## 2013-08-29 DIAGNOSIS — Z9861 Coronary angioplasty status: Secondary | ICD-10-CM | POA: Diagnosis not present

## 2013-08-29 DIAGNOSIS — R5381 Other malaise: Secondary | ICD-10-CM | POA: Diagnosis not present

## 2013-08-29 DIAGNOSIS — A419 Sepsis, unspecified organism: Secondary | ICD-10-CM | POA: Diagnosis not present

## 2013-08-29 DIAGNOSIS — S7000XA Contusion of unspecified hip, initial encounter: Secondary | ICD-10-CM | POA: Diagnosis not present

## 2013-08-29 DIAGNOSIS — S79919A Unspecified injury of unspecified hip, initial encounter: Secondary | ICD-10-CM | POA: Diagnosis not present

## 2013-08-29 DIAGNOSIS — R55 Syncope and collapse: Secondary | ICD-10-CM | POA: Diagnosis not present

## 2013-08-29 DIAGNOSIS — J441 Chronic obstructive pulmonary disease with (acute) exacerbation: Secondary | ICD-10-CM | POA: Diagnosis not present

## 2013-08-29 DIAGNOSIS — I517 Cardiomegaly: Secondary | ICD-10-CM | POA: Diagnosis not present

## 2013-08-29 DIAGNOSIS — I4891 Unspecified atrial fibrillation: Secondary | ICD-10-CM | POA: Diagnosis not present

## 2013-08-29 DIAGNOSIS — I2510 Atherosclerotic heart disease of native coronary artery without angina pectoris: Secondary | ICD-10-CM | POA: Diagnosis not present

## 2013-08-29 LAB — CK TOTAL AND CKMB (NOT AT ARMC)
CK, Total: 111 U/L (ref 35–232)
CK, Total: 136 U/L (ref 35–232)
CK-MB: 2.1 ng/mL (ref 0.5–3.6)
CK-MB: 2.1 ng/mL (ref 0.5–3.6)

## 2013-08-29 LAB — URINALYSIS, COMPLETE
Bacteria: NONE SEEN
Blood: NEGATIVE
Leukocyte Esterase: NEGATIVE
Ph: 5 (ref 4.5–8.0)
Protein: NEGATIVE
Specific Gravity: 1.019 (ref 1.003–1.030)

## 2013-08-29 LAB — CBC WITH DIFFERENTIAL/PLATELET
Basophil #: 0 10*3/uL (ref 0.0–0.1)
Basophil %: 0.4 %
HCT: 38.5 % — ABNORMAL LOW (ref 40.0–52.0)
HGB: 13.1 g/dL (ref 13.0–18.0)
Lymphocyte #: 0.7 10*3/uL — ABNORMAL LOW (ref 1.0–3.6)
MCH: 31.7 pg (ref 26.0–34.0)
MCHC: 34.1 g/dL (ref 32.0–36.0)
MCV: 93 fL (ref 80–100)
Monocyte #: 1.1 x10 3/mm — ABNORMAL HIGH (ref 0.2–1.0)
Monocyte %: 13.5 %
Neutrophil %: 76.8 %
Platelet: 160 10*3/uL (ref 150–440)
WBC: 8 10*3/uL (ref 3.8–10.6)

## 2013-08-29 LAB — COMPREHENSIVE METABOLIC PANEL
Alkaline Phosphatase: 30 U/L — ABNORMAL LOW
Anion Gap: 7 (ref 7–16)
Bilirubin,Total: 0.6 mg/dL (ref 0.2–1.0)
Calcium, Total: 8.3 mg/dL — ABNORMAL LOW (ref 8.5–10.1)
Chloride: 107 mmol/L (ref 98–107)
Co2: 25 mmol/L (ref 21–32)
EGFR (Non-African Amer.): 57 — ABNORMAL LOW
Glucose: 120 mg/dL — ABNORMAL HIGH (ref 65–99)
Osmolality: 279 (ref 275–301)
Sodium: 139 mmol/L (ref 136–145)
Total Protein: 6.9 g/dL (ref 6.4–8.2)

## 2013-08-29 LAB — TSH: Thyroid Stimulating Horm: 0.212 u[IU]/mL — ABNORMAL LOW

## 2013-08-29 LAB — T4, FREE: Free Thyroxine: 1.11 ng/dL (ref 0.76–1.46)

## 2013-08-29 LAB — RAPID INFLUENZA A&B ANTIGENS

## 2013-08-29 LAB — LIPID PANEL: HDL Cholesterol: 28 mg/dL — ABNORMAL LOW (ref 40–60)

## 2013-08-29 LAB — TROPONIN I: Troponin-I: 0.04 ng/mL

## 2013-08-29 LAB — APTT: Activated PTT: 147.6 secs — ABNORMAL HIGH (ref 23.6–35.9)

## 2013-08-30 LAB — BASIC METABOLIC PANEL
Anion Gap: 8 (ref 7–16)
BUN: 17 mg/dL (ref 7–18)
Chloride: 106 mmol/L (ref 98–107)
Co2: 24 mmol/L (ref 21–32)
Creatinine: 1 mg/dL (ref 0.60–1.30)
EGFR (Non-African Amer.): >60
Glucose: 111 mg/dL — ABNORMAL HIGH (ref 65–99)
Osmolality: 278 (ref 275–301)

## 2013-08-30 LAB — CBC WITH DIFFERENTIAL/PLATELET
Basophil #: 0 10*3/uL (ref 0.0–0.1)
Eosinophil #: 0 10*3/uL (ref 0.0–0.7)
Eosinophil %: 0.1 %
HCT: 43.1 % (ref 40.0–52.0)
HGB: 14.4 g/dL (ref 13.0–18.0)
Lymphocyte %: 13.1 %
MCH: 31.1 pg (ref 26.0–34.0)
MCHC: 33.3 g/dL (ref 32.0–36.0)
MCV: 93 fL (ref 80–100)
Monocyte #: 0.9 x10 3/mm (ref 0.2–1.0)
Neutrophil #: 5.6 10*3/uL (ref 1.4–6.5)
Neutrophil %: 74.9 %
Platelet: 163 10*3/uL (ref 150–440)
WBC: 7.4 10*3/uL (ref 3.8–10.6)

## 2013-08-30 LAB — APTT: Activated PTT: 39.2 secs — ABNORMAL HIGH (ref 23.6–35.9)

## 2013-08-30 LAB — PROTIME-INR
INR: 1.5
Prothrombin Time: 17.8 secs — ABNORMAL HIGH (ref 11.5–14.7)

## 2013-08-31 ENCOUNTER — Encounter: Payer: Self-pay | Admitting: *Deleted

## 2013-08-31 LAB — PROTIME-INR
INR: 1.3
Prothrombin Time: 16.2 secs — ABNORMAL HIGH (ref 11.5–14.7)

## 2013-08-31 LAB — APTT: Activated PTT: 110.5 secs — ABNORMAL HIGH (ref 23.6–35.9)

## 2013-09-01 DIAGNOSIS — J111 Influenza due to unidentified influenza virus with other respiratory manifestations: Secondary | ICD-10-CM

## 2013-09-01 LAB — PLATELET COUNT: Platelet: 172 10*3/uL (ref 150–440)

## 2013-09-01 LAB — PROTIME-INR: INR: 1.7

## 2013-09-01 LAB — APTT
Activated PTT: 137.2 secs — ABNORMAL HIGH (ref 23.6–35.9)
Activated PTT: 99.7 secs — ABNORMAL HIGH (ref 23.6–35.9)

## 2013-09-01 LAB — HEMOGLOBIN: HGB: 13.9 g/dL (ref 13.0–18.0)

## 2013-09-02 ENCOUNTER — Telehealth: Payer: Self-pay | Admitting: *Deleted

## 2013-09-02 DIAGNOSIS — E119 Type 2 diabetes mellitus without complications: Secondary | ICD-10-CM | POA: Diagnosis not present

## 2013-09-02 DIAGNOSIS — I4891 Unspecified atrial fibrillation: Secondary | ICD-10-CM | POA: Diagnosis not present

## 2013-09-02 DIAGNOSIS — Z5181 Encounter for therapeutic drug level monitoring: Secondary | ICD-10-CM | POA: Diagnosis not present

## 2013-09-02 DIAGNOSIS — I10 Essential (primary) hypertension: Secondary | ICD-10-CM | POA: Diagnosis not present

## 2013-09-02 DIAGNOSIS — R269 Unspecified abnormalities of gait and mobility: Secondary | ICD-10-CM | POA: Diagnosis not present

## 2013-09-02 DIAGNOSIS — Z7901 Long term (current) use of anticoagulants: Secondary | ICD-10-CM | POA: Diagnosis not present

## 2013-09-02 DIAGNOSIS — I2510 Atherosclerotic heart disease of native coronary artery without angina pectoris: Secondary | ICD-10-CM | POA: Diagnosis not present

## 2013-09-02 DIAGNOSIS — G20 Parkinson's disease: Secondary | ICD-10-CM | POA: Diagnosis not present

## 2013-09-02 DIAGNOSIS — J441 Chronic obstructive pulmonary disease with (acute) exacerbation: Secondary | ICD-10-CM | POA: Diagnosis not present

## 2013-09-02 DIAGNOSIS — Z9181 History of falling: Secondary | ICD-10-CM | POA: Diagnosis not present

## 2013-09-02 LAB — LAB REPORT - SCANNED: INR: 5.8

## 2013-09-02 NOTE — Telephone Encounter (Signed)
Patient contacted regarding discharge from [facility] on 09/01/13.  Patient understands to follow up with provider Dr. Rockey Situ on 09/03/13 at 11:30 at [facility]. Patient understands discharge instructions? yes Patient understands medications and regiment? yes Patient understands to bring all medications to this visit? yes

## 2013-09-03 ENCOUNTER — Telehealth: Payer: Self-pay | Admitting: Nurse Practitioner

## 2013-09-03 DIAGNOSIS — J441 Chronic obstructive pulmonary disease with (acute) exacerbation: Secondary | ICD-10-CM | POA: Diagnosis not present

## 2013-09-03 DIAGNOSIS — R269 Unspecified abnormalities of gait and mobility: Secondary | ICD-10-CM | POA: Diagnosis not present

## 2013-09-03 DIAGNOSIS — E119 Type 2 diabetes mellitus without complications: Secondary | ICD-10-CM | POA: Diagnosis not present

## 2013-09-03 DIAGNOSIS — I2510 Atherosclerotic heart disease of native coronary artery without angina pectoris: Secondary | ICD-10-CM | POA: Diagnosis not present

## 2013-09-03 DIAGNOSIS — I4891 Unspecified atrial fibrillation: Secondary | ICD-10-CM | POA: Diagnosis not present

## 2013-09-03 DIAGNOSIS — G20 Parkinson's disease: Secondary | ICD-10-CM | POA: Diagnosis not present

## 2013-09-03 LAB — CULTURE, BLOOD (SINGLE)

## 2013-09-03 NOTE — Telephone Encounter (Signed)
I received a call from Linward Foster, RN with [facility].  She saw Joel Wilson yesterday as a new account and checked an INR.  He was recently started on coumadin following admission for afib at [facility] earlier this week.  I do not have access to those records.  Regardless, she checked an INR late in the day yesterday and this morning discovered it to be 5.8.  I rec that coumadin be held tonight and that repeat INR be drawn in the AM.  She will call that result in during the day tomorrow so that we can have a game plan going into the weekend.

## 2013-09-04 ENCOUNTER — Ambulatory Visit (INDEPENDENT_AMBULATORY_CARE_PROVIDER_SITE_OTHER): Payer: Medicare Other | Admitting: Cardiovascular Disease

## 2013-09-04 ENCOUNTER — Encounter: Payer: Self-pay | Admitting: Cardiovascular Disease

## 2013-09-04 VITALS — BP 154/80 | HR 79 | Ht 70.0 in | Wt 208.5 lb

## 2013-09-04 DIAGNOSIS — R269 Unspecified abnormalities of gait and mobility: Secondary | ICD-10-CM | POA: Diagnosis not present

## 2013-09-04 DIAGNOSIS — Z79899 Other long term (current) drug therapy: Secondary | ICD-10-CM | POA: Diagnosis not present

## 2013-09-04 DIAGNOSIS — I4891 Unspecified atrial fibrillation: Secondary | ICD-10-CM | POA: Diagnosis not present

## 2013-09-04 DIAGNOSIS — I10 Essential (primary) hypertension: Secondary | ICD-10-CM | POA: Diagnosis not present

## 2013-09-04 DIAGNOSIS — G20 Parkinson's disease: Secondary | ICD-10-CM | POA: Diagnosis not present

## 2013-09-04 DIAGNOSIS — E119 Type 2 diabetes mellitus without complications: Secondary | ICD-10-CM | POA: Diagnosis not present

## 2013-09-04 DIAGNOSIS — Z5181 Encounter for therapeutic drug level monitoring: Secondary | ICD-10-CM | POA: Diagnosis not present

## 2013-09-04 DIAGNOSIS — J069 Acute upper respiratory infection, unspecified: Secondary | ICD-10-CM | POA: Insufficient documentation

## 2013-09-04 DIAGNOSIS — E785 Hyperlipidemia, unspecified: Secondary | ICD-10-CM

## 2013-09-04 DIAGNOSIS — R110 Nausea: Secondary | ICD-10-CM

## 2013-09-04 DIAGNOSIS — J441 Chronic obstructive pulmonary disease with (acute) exacerbation: Secondary | ICD-10-CM | POA: Diagnosis not present

## 2013-09-04 DIAGNOSIS — I2510 Atherosclerotic heart disease of native coronary artery without angina pectoris: Secondary | ICD-10-CM

## 2013-09-04 LAB — POCT INR: INR: 4.7

## 2013-09-04 NOTE — Assessment & Plan Note (Signed)
He was changed to Crestor at discharge. We'll restart simvastatin . Uncertain why this was changed in the first place in the hospital. Hospital records at discharge indicate both Lipitor and Crestor on his discharge list.

## 2013-09-04 NOTE — Assessment & Plan Note (Signed)
Appears to be resolving. Continued cough. Scant crackle in the right base. We'll monitor for now

## 2013-09-04 NOTE — Assessment & Plan Note (Signed)
He does report having nausea since he left the hospital. We will decrease the amiodarone dosing. Change several of his medications to evening dosing. Increase  his nausea medication

## 2013-09-04 NOTE — Assessment & Plan Note (Signed)
Blood pressure is well controlled on today's visit. No changes made to the medications. 

## 2013-09-04 NOTE — Assessment & Plan Note (Signed)
Developed atrial fibrillation on Christmas Day 2014. Started on amiodarone with conversion to normal sinus rhythm. Likely from upper respiratory infection. Viral infection appears to be resolving. We'll decrease the amiodarone down to 200 mg daily at nighttime given his nausea in the morning. Continue diltiazem 240 mg daily

## 2013-09-04 NOTE — Patient Instructions (Addendum)
You are doing well. No medication changes were made.  Please call us if you have new issues that need to be addressed before your next appt.  Your physician wants you to follow-up in: 2 months.    1.  Please restart Metoprolol 25 mg PO BID 2.  Decrease Amiodarone down to 200 mg daily (QHS) 3.  Discontinue Combivent 4.  Increase ondansetron to 8 mg PO TID PRN for nausea 5.  If still having nausea into next week, call our office 6.  Discontinue Crestor 7.  Restart Simvastatin 40mg  PO QHS 8.  Take Diltiazem 240 mg PO QHS (change to night)

## 2013-09-04 NOTE — Progress Notes (Signed)
Patient ID: Marl Schork., male    DOB: [birth year], 73 y.o.   MRN: [id number]  HPI Comments: Mr. Redell is a 73 year old man who lives at [facility] with CAD, hypertension, recent admission to the hospital for near syncope found to be in atrial fibrillation . status post stenting of the ramus artery in 2003, hypertension and hyperlipidemia.  known history of back pain with previous neck surgery. He has significant neuropathy and balance problems and thus he uses a walker.  He was admitted to the hospital 08/29/2013. He reported having cough, fever consistent with upper respiratory infection. On Christmas day had tachycardia, worse on 08/28/2013. He had near syncope. In the hospital he was started on amiodarone and converted to normal sinus rhythm. He was febrile on arrival was 100.5 temperature He was given Tamiflu at discharge.  Since hospital discharge, the patient has not had any further chest pain. He denies any dyspnea or palpitations. He does have residual cough. Cardiac enzymes negative to his hospital course  In followup today he reports feeling better but has nausea. It is uncertain if this is one of the new medications. He was discharged on amiodarone 200 mg twice a day, diltiazem 240 mg daily. He takes off his medications in the morning. Takes Zofran low-dose when necessary for nausea  Carotid ultrasound in the hospital 08/31/2013 showing mild plaque bilaterally less than 50%  Echocardiogram 08/29/2013 showing normal ejection fraction greater than 60%, mild LVH, moderate mitral valve calcification   recent with chest pain associated with T wave changes in the anterior leads. He ruled out for myocardial infarction. He underwent cardiac catheterization  which showed no evidence of obstructive coronary artery disease. The stent in the ramus was patent with mild instent restenosis. There was a mild stenosis in the proximal LAD and ejection fraction was normal.  EKG  shows normal sinus rhythm with rate 79 beats per minute, nonspecific ST and T wave abnormality inferior leads     Outpatient Encounter Prescriptions as of 09/04/2013  Medication Sig  . acetaminophen (TYLENOL) 325 MG tablet Take 650 mg by mouth every 6 (six) hours as needed for pain.  Marland Kitchen amiodarone (PACERONE) 200 MG tablet Take 200 mg by mouth 2 (two) times daily.   . calcium carbonate (OS-CAL) 600 MG TABS Take 600 mg by mouth daily.  . cholecalciferol (VITAMIN D) 1000 UNITS tablet Take 1,000 Units by mouth daily.  . clopidogrel (PLAVIX) 75 MG tablet Take 75 mg by mouth daily.  . CRESTOR 10 MG tablet Take 10 mg by mouth daily.   Marland Kitchen diltiazem (CARDIZEM CD) 240 MG 24 hr capsule Take 240 mg by mouth daily.   . finasteride (PROSCAR) 5 MG tablet Take 5 mg by mouth daily.  . Fluticasone-Salmeterol (ADVAIR) 250-50 MCG/DOSE AEPB Inhale 1 puff into the lungs 2 (two) times daily.  Marland Kitchen gabapentin (NEURONTIN) 300 MG capsule Take 300 mg by mouth 3 (three) times daily.  . hydrochlorothiazide (HYDRODIURIL) 12.5 MG tablet Take 12.5 mg by mouth daily.  Marland Kitchen lisinopril (PRINIVIL,ZESTRIL) 20 MG tablet Take 20 mg by mouth daily.  . metoprolol tartrate (LOPRESSOR) 25 MG tablet Take 25 mg by mouth 2 (two) times daily.  . nitroGLYCERIN (NITROSTAT) 0.4 MG SL tablet Place 0.4 mg under the tongue every 5 (five) minutes as needed for chest pain.  Marland Kitchen ondansetron (ZOFRAN) 4 MG tablet Take 4 mg by mouth as needed for nausea.  Marland Kitchen oxycodone (OXY-IR) 5 MG capsule Take 5 mg by mouth  every 6 (six) hours as needed.  . SENNA CO by Combination route. Takes 2 tablets twice daily.  . simvastatin (ZOCOR) 80 MG tablet Take 40 mg by mouth at bedtime.  Marland Kitchen SPIRIVA HANDIHALER 18 MCG inhalation capsule Place 18 mcg into inhaler and inhale daily.   . tamsulosin (FLOMAX) 0.4 MG CAPS Take 0.4 mg by mouth daily.  . Triamcinolone Acetonide (TRIAMCINOLONE 0.1 % CREAM : EUCERIN) CREA Apply 1 application topically daily.  Marland Kitchen warfarin (COUMADIN) 3 MG tablet      Review of Systems  Constitutional: Negative.   HENT: Negative.   Eyes: Negative.   Respiratory: Negative.   Cardiovascular: Negative.   Gastrointestinal: Negative.   Endocrine: Negative.   Musculoskeletal: Negative.   Skin: Negative.   Allergic/Immunologic: Negative.   Neurological: Negative.   Hematological: Negative.   Psychiatric/Behavioral: Negative.   All other systems reviewed and are negative.    BP 154/80  Pulse 79  Ht 5\' 10"  (1.778 m)  Wt 208 lb 8 oz (94.575 kg)  BMI 29.92 kg/m2  Physical Exam  Nursing note and vitals reviewed. Constitutional: He is oriented to person, place, and time. He appears well-developed and well-nourished.  HENT:  Head: Normocephalic.  Nose: Nose normal.  Mouth/Throat: Oropharynx is clear and moist.  Eyes: Conjunctivae are normal. Pupils are equal, round, and reactive to light.  Neck: Normal range of motion. Neck supple. No JVD present.  Cardiovascular: Normal rate, regular rhythm, S1 normal, S2 normal, normal heart sounds and intact distal pulses.  Exam reveals no gallop and no friction rub.   No murmur heard. Pulmonary/Chest: Effort normal and breath sounds normal. No respiratory distress. He has no wheezes. He has no rales. He exhibits no tenderness.  Scant crackles in the right base  Abdominal: Soft. Bowel sounds are normal. He exhibits no distension. There is no tenderness.  Musculoskeletal: Normal range of motion. He exhibits no edema and no tenderness.  Lymphadenopathy:    He has no cervical adenopathy.  Neurological: He is alert and oriented to person, place, and time. Coordination normal.  Skin: Skin is warm and dry. No rash noted. No erythema.  Psychiatric: He has a normal mood and affect. His behavior is normal. Judgment and thought content normal.      Assessment and Plan

## 2013-09-07 ENCOUNTER — Ambulatory Visit (INDEPENDENT_AMBULATORY_CARE_PROVIDER_SITE_OTHER): Payer: Medicare Other | Admitting: Cardiology

## 2013-09-07 ENCOUNTER — Telehealth: Payer: Self-pay | Admitting: *Deleted

## 2013-09-07 DIAGNOSIS — I4891 Unspecified atrial fibrillation: Secondary | ICD-10-CM | POA: Diagnosis not present

## 2013-09-07 DIAGNOSIS — I2510 Atherosclerotic heart disease of native coronary artery without angina pectoris: Secondary | ICD-10-CM | POA: Diagnosis not present

## 2013-09-07 DIAGNOSIS — R269 Unspecified abnormalities of gait and mobility: Secondary | ICD-10-CM | POA: Diagnosis not present

## 2013-09-07 DIAGNOSIS — Z7901 Long term (current) use of anticoagulants: Secondary | ICD-10-CM

## 2013-09-07 DIAGNOSIS — G20 Parkinson's disease: Secondary | ICD-10-CM | POA: Diagnosis not present

## 2013-09-07 DIAGNOSIS — J441 Chronic obstructive pulmonary disease with (acute) exacerbation: Secondary | ICD-10-CM | POA: Diagnosis not present

## 2013-09-07 DIAGNOSIS — E119 Type 2 diabetes mellitus without complications: Secondary | ICD-10-CM | POA: Diagnosis not present

## 2013-09-07 NOTE — Telephone Encounter (Signed)
Called spoke with Kennyth Lose, care coordinator at [facility] facility.  Pt hasn't taken any Coumadin since 09/03/13 holding secondary to INR 5.8.  INR rechecked on 09/04/12 4.7 results have not been addressed.  See anticoagulation note in Epic from 09/07/13 with dosage instructions and details.

## 2013-09-07 NOTE — Telephone Encounter (Signed)
Kennyth Lose, RN w/ [facility] called w/ pt's INR: 4.7. Reports that pt was d/c'd from [facility] w/ orders to have coumadin checked thru our coumadin clinic.  Reports that per Ignacia Bayley, NP, coumadin dose was held 09/03/13. Kennyth Lose would like any changes in orders faxed to her attn at [facility] @ [phone].

## 2013-09-07 NOTE — Telephone Encounter (Signed)
[facility] called and his INR 4.7.

## 2013-09-08 ENCOUNTER — Telehealth: Payer: Self-pay | Admitting: *Deleted

## 2013-09-08 NOTE — Telephone Encounter (Signed)
Please call Joel Wilson w/ [facility] (Care Coordinator) [facility] wants to change his bp meds.

## 2013-09-09 NOTE — Telephone Encounter (Signed)
Would monitor her blood pressure as you have been doing Perhaps we can look at these numbers before restarting lisinopril

## 2013-09-09 NOTE — Telephone Encounter (Signed)
Spoke w/ Kennyth Lose at [facility].  She reports that pt saw his PCP at [facility] yesterday.   States that pt walked from the parking lot of the [place] and his BP was checked shortly after he walked in the door. Reports that based on this reading, [facility] back on lisinopril, but did not sign order. Kennyth Lose wanted to make sure that this was reviewed by Dr. Rockey Situ before starting back on this, as pt's BP has been well controlled since being d/c'd from [facility]. Kennyth Lose states that she will not make any med changes from [place] visit unless she is instructed to do so by Dr. Rockey Situ, as she does not believe any changes are necessary at this point.  Please advise.  Thank you.

## 2013-09-10 ENCOUNTER — Ambulatory Visit: Payer: Medicare Other | Admitting: Cardiovascular Disease

## 2013-09-10 ENCOUNTER — Telehealth: Payer: Self-pay

## 2013-09-10 DIAGNOSIS — I2510 Atherosclerotic heart disease of native coronary artery without angina pectoris: Secondary | ICD-10-CM | POA: Diagnosis not present

## 2013-09-10 DIAGNOSIS — J441 Chronic obstructive pulmonary disease with (acute) exacerbation: Secondary | ICD-10-CM | POA: Diagnosis not present

## 2013-09-10 DIAGNOSIS — G20 Parkinson's disease: Secondary | ICD-10-CM | POA: Diagnosis not present

## 2013-09-10 DIAGNOSIS — E119 Type 2 diabetes mellitus without complications: Secondary | ICD-10-CM | POA: Diagnosis not present

## 2013-09-10 DIAGNOSIS — I4891 Unspecified atrial fibrillation: Secondary | ICD-10-CM | POA: Diagnosis not present

## 2013-09-10 DIAGNOSIS — R269 Unspecified abnormalities of gait and mobility: Secondary | ICD-10-CM | POA: Diagnosis not present

## 2013-09-10 NOTE — Telephone Encounter (Signed)
Spoke w/ Kennyth Lose.  She is agreeable to this.  Reports pt states that he is fine w/ stopping either of the meds.

## 2013-09-10 NOTE — Telephone Encounter (Signed)
Would probably hold off on Advair or Symbicort See how breathing goes over the next several weeks Can restart Symbicort if needed

## 2013-09-10 NOTE — Telephone Encounter (Signed)
Kennyth Lose from [facility] called stating that pt was d/c'd from hospital on Advair. Reports that pt ran out and when he tried to have it refilled, found that it cost $344. Pharmacy recommend that he be switched to Symbicort, which is cheaper. Kennyth Lose does not feel that pt needs it, as he is feeling better. Pt's PCP is at the [place] [facility] and difficult to get ahold of, so Kennyth Lose would like to know if Dr. Rockey Situ could advise on this.

## 2013-09-10 NOTE — Telephone Encounter (Signed)
Left detailed message for Kennyth Lose and asked for her to call w/ any questions or concerns.

## 2013-09-11 ENCOUNTER — Ambulatory Visit (INDEPENDENT_AMBULATORY_CARE_PROVIDER_SITE_OTHER): Payer: Medicare Other | Admitting: Pharmacist

## 2013-09-11 DIAGNOSIS — I4891 Unspecified atrial fibrillation: Secondary | ICD-10-CM | POA: Diagnosis not present

## 2013-09-11 DIAGNOSIS — J441 Chronic obstructive pulmonary disease with (acute) exacerbation: Secondary | ICD-10-CM | POA: Diagnosis not present

## 2013-09-11 DIAGNOSIS — G20 Parkinson's disease: Secondary | ICD-10-CM | POA: Diagnosis not present

## 2013-09-11 DIAGNOSIS — R269 Unspecified abnormalities of gait and mobility: Secondary | ICD-10-CM | POA: Diagnosis not present

## 2013-09-11 DIAGNOSIS — I2510 Atherosclerotic heart disease of native coronary artery without angina pectoris: Secondary | ICD-10-CM | POA: Diagnosis not present

## 2013-09-11 DIAGNOSIS — E119 Type 2 diabetes mellitus without complications: Secondary | ICD-10-CM | POA: Diagnosis not present

## 2013-09-11 DIAGNOSIS — Z7901 Long term (current) use of anticoagulants: Secondary | ICD-10-CM

## 2013-09-11 DIAGNOSIS — Z79899 Other long term (current) drug therapy: Secondary | ICD-10-CM | POA: Diagnosis not present

## 2013-09-11 LAB — LAB REPORT - SCANNED: INR: 1

## 2013-09-11 LAB — POCT INR: INR: 1

## 2013-09-14 DIAGNOSIS — G20 Parkinson's disease: Secondary | ICD-10-CM | POA: Diagnosis not present

## 2013-09-14 DIAGNOSIS — I2510 Atherosclerotic heart disease of native coronary artery without angina pectoris: Secondary | ICD-10-CM | POA: Diagnosis not present

## 2013-09-14 DIAGNOSIS — E119 Type 2 diabetes mellitus without complications: Secondary | ICD-10-CM | POA: Diagnosis not present

## 2013-09-14 DIAGNOSIS — J441 Chronic obstructive pulmonary disease with (acute) exacerbation: Secondary | ICD-10-CM | POA: Diagnosis not present

## 2013-09-14 DIAGNOSIS — I4891 Unspecified atrial fibrillation: Secondary | ICD-10-CM | POA: Diagnosis not present

## 2013-09-14 DIAGNOSIS — R269 Unspecified abnormalities of gait and mobility: Secondary | ICD-10-CM | POA: Diagnosis not present

## 2013-09-18 ENCOUNTER — Encounter: Payer: Self-pay | Admitting: Cardiovascular Disease

## 2013-09-18 ENCOUNTER — Ambulatory Visit (INDEPENDENT_AMBULATORY_CARE_PROVIDER_SITE_OTHER): Payer: Medicare Other | Admitting: Pharmacist

## 2013-09-18 DIAGNOSIS — I2510 Atherosclerotic heart disease of native coronary artery without angina pectoris: Secondary | ICD-10-CM | POA: Diagnosis not present

## 2013-09-18 DIAGNOSIS — R269 Unspecified abnormalities of gait and mobility: Secondary | ICD-10-CM | POA: Diagnosis not present

## 2013-09-18 DIAGNOSIS — E119 Type 2 diabetes mellitus without complications: Secondary | ICD-10-CM | POA: Diagnosis not present

## 2013-09-18 DIAGNOSIS — G20 Parkinson's disease: Secondary | ICD-10-CM | POA: Diagnosis not present

## 2013-09-18 DIAGNOSIS — I4891 Unspecified atrial fibrillation: Secondary | ICD-10-CM

## 2013-09-18 DIAGNOSIS — J441 Chronic obstructive pulmonary disease with (acute) exacerbation: Secondary | ICD-10-CM | POA: Diagnosis not present

## 2013-09-18 DIAGNOSIS — Z7901 Long term (current) use of anticoagulants: Secondary | ICD-10-CM

## 2013-09-18 DIAGNOSIS — Z79899 Other long term (current) drug therapy: Secondary | ICD-10-CM | POA: Diagnosis not present

## 2013-09-18 LAB — PROTIME-INR: INR: 1.4 — AB (ref 0.9–1.1)

## 2013-09-21 DIAGNOSIS — R269 Unspecified abnormalities of gait and mobility: Secondary | ICD-10-CM | POA: Diagnosis not present

## 2013-09-21 DIAGNOSIS — J441 Chronic obstructive pulmonary disease with (acute) exacerbation: Secondary | ICD-10-CM | POA: Diagnosis not present

## 2013-09-21 DIAGNOSIS — E119 Type 2 diabetes mellitus without complications: Secondary | ICD-10-CM | POA: Diagnosis not present

## 2013-09-21 DIAGNOSIS — G20 Parkinson's disease: Secondary | ICD-10-CM | POA: Diagnosis not present

## 2013-09-21 DIAGNOSIS — I2510 Atherosclerotic heart disease of native coronary artery without angina pectoris: Secondary | ICD-10-CM | POA: Diagnosis not present

## 2013-09-21 DIAGNOSIS — I4891 Unspecified atrial fibrillation: Secondary | ICD-10-CM | POA: Diagnosis not present

## 2013-09-22 DIAGNOSIS — E119 Type 2 diabetes mellitus without complications: Secondary | ICD-10-CM | POA: Diagnosis not present

## 2013-09-22 DIAGNOSIS — R269 Unspecified abnormalities of gait and mobility: Secondary | ICD-10-CM | POA: Diagnosis not present

## 2013-09-22 DIAGNOSIS — I4891 Unspecified atrial fibrillation: Secondary | ICD-10-CM | POA: Diagnosis not present

## 2013-09-22 DIAGNOSIS — J441 Chronic obstructive pulmonary disease with (acute) exacerbation: Secondary | ICD-10-CM | POA: Diagnosis not present

## 2013-09-22 DIAGNOSIS — G20 Parkinson's disease: Secondary | ICD-10-CM | POA: Diagnosis not present

## 2013-09-22 DIAGNOSIS — I2510 Atherosclerotic heart disease of native coronary artery without angina pectoris: Secondary | ICD-10-CM | POA: Diagnosis not present

## 2013-09-24 DIAGNOSIS — J441 Chronic obstructive pulmonary disease with (acute) exacerbation: Secondary | ICD-10-CM | POA: Diagnosis not present

## 2013-09-24 DIAGNOSIS — G20 Parkinson's disease: Secondary | ICD-10-CM | POA: Diagnosis not present

## 2013-09-24 DIAGNOSIS — I2510 Atherosclerotic heart disease of native coronary artery without angina pectoris: Secondary | ICD-10-CM | POA: Diagnosis not present

## 2013-09-24 DIAGNOSIS — E119 Type 2 diabetes mellitus without complications: Secondary | ICD-10-CM | POA: Diagnosis not present

## 2013-09-24 DIAGNOSIS — R269 Unspecified abnormalities of gait and mobility: Secondary | ICD-10-CM | POA: Diagnosis not present

## 2013-09-24 DIAGNOSIS — I4891 Unspecified atrial fibrillation: Secondary | ICD-10-CM | POA: Diagnosis not present

## 2013-09-25 ENCOUNTER — Ambulatory Visit (INDEPENDENT_AMBULATORY_CARE_PROVIDER_SITE_OTHER): Payer: Medicare Other | Admitting: Cardiology

## 2013-09-25 DIAGNOSIS — J441 Chronic obstructive pulmonary disease with (acute) exacerbation: Secondary | ICD-10-CM | POA: Diagnosis not present

## 2013-09-25 DIAGNOSIS — E119 Type 2 diabetes mellitus without complications: Secondary | ICD-10-CM | POA: Diagnosis not present

## 2013-09-25 DIAGNOSIS — Z7901 Long term (current) use of anticoagulants: Secondary | ICD-10-CM

## 2013-09-25 DIAGNOSIS — I4891 Unspecified atrial fibrillation: Secondary | ICD-10-CM | POA: Diagnosis not present

## 2013-09-25 DIAGNOSIS — I2510 Atherosclerotic heart disease of native coronary artery without angina pectoris: Secondary | ICD-10-CM | POA: Diagnosis not present

## 2013-09-25 DIAGNOSIS — G20 Parkinson's disease: Secondary | ICD-10-CM | POA: Diagnosis not present

## 2013-09-25 DIAGNOSIS — Z79899 Other long term (current) drug therapy: Secondary | ICD-10-CM | POA: Diagnosis not present

## 2013-09-25 DIAGNOSIS — R269 Unspecified abnormalities of gait and mobility: Secondary | ICD-10-CM | POA: Diagnosis not present

## 2013-09-25 LAB — PROTIME-INR: INR: 2.3 — AB (ref 0.9–1.1)

## 2013-09-28 DIAGNOSIS — R269 Unspecified abnormalities of gait and mobility: Secondary | ICD-10-CM | POA: Diagnosis not present

## 2013-09-28 DIAGNOSIS — I4891 Unspecified atrial fibrillation: Secondary | ICD-10-CM | POA: Diagnosis not present

## 2013-09-28 DIAGNOSIS — J441 Chronic obstructive pulmonary disease with (acute) exacerbation: Secondary | ICD-10-CM | POA: Diagnosis not present

## 2013-09-28 DIAGNOSIS — G20 Parkinson's disease: Secondary | ICD-10-CM | POA: Diagnosis not present

## 2013-09-28 DIAGNOSIS — I2510 Atherosclerotic heart disease of native coronary artery without angina pectoris: Secondary | ICD-10-CM | POA: Diagnosis not present

## 2013-09-28 DIAGNOSIS — E119 Type 2 diabetes mellitus without complications: Secondary | ICD-10-CM | POA: Diagnosis not present

## 2013-09-29 DIAGNOSIS — G20 Parkinson's disease: Secondary | ICD-10-CM | POA: Diagnosis not present

## 2013-09-29 DIAGNOSIS — J441 Chronic obstructive pulmonary disease with (acute) exacerbation: Secondary | ICD-10-CM | POA: Diagnosis not present

## 2013-09-29 DIAGNOSIS — E119 Type 2 diabetes mellitus without complications: Secondary | ICD-10-CM | POA: Diagnosis not present

## 2013-09-29 DIAGNOSIS — I2510 Atherosclerotic heart disease of native coronary artery without angina pectoris: Secondary | ICD-10-CM | POA: Diagnosis not present

## 2013-09-29 DIAGNOSIS — R269 Unspecified abnormalities of gait and mobility: Secondary | ICD-10-CM | POA: Diagnosis not present

## 2013-09-29 DIAGNOSIS — I4891 Unspecified atrial fibrillation: Secondary | ICD-10-CM | POA: Diagnosis not present

## 2013-09-30 DIAGNOSIS — I4891 Unspecified atrial fibrillation: Secondary | ICD-10-CM | POA: Diagnosis not present

## 2013-09-30 DIAGNOSIS — I2510 Atherosclerotic heart disease of native coronary artery without angina pectoris: Secondary | ICD-10-CM | POA: Diagnosis not present

## 2013-09-30 DIAGNOSIS — I10 Essential (primary) hypertension: Secondary | ICD-10-CM | POA: Diagnosis not present

## 2013-09-30 DIAGNOSIS — E785 Hyperlipidemia, unspecified: Secondary | ICD-10-CM

## 2013-09-30 DIAGNOSIS — R110 Nausea: Secondary | ICD-10-CM

## 2013-09-30 DIAGNOSIS — J069 Acute upper respiratory infection, unspecified: Secondary | ICD-10-CM

## 2013-10-01 DIAGNOSIS — J441 Chronic obstructive pulmonary disease with (acute) exacerbation: Secondary | ICD-10-CM | POA: Diagnosis not present

## 2013-10-01 DIAGNOSIS — I2510 Atherosclerotic heart disease of native coronary artery without angina pectoris: Secondary | ICD-10-CM | POA: Diagnosis not present

## 2013-10-01 DIAGNOSIS — R269 Unspecified abnormalities of gait and mobility: Secondary | ICD-10-CM | POA: Diagnosis not present

## 2013-10-01 DIAGNOSIS — I4891 Unspecified atrial fibrillation: Secondary | ICD-10-CM | POA: Diagnosis not present

## 2013-10-01 DIAGNOSIS — E119 Type 2 diabetes mellitus without complications: Secondary | ICD-10-CM | POA: Diagnosis not present

## 2013-10-01 DIAGNOSIS — G20 Parkinson's disease: Secondary | ICD-10-CM | POA: Diagnosis not present

## 2013-10-02 ENCOUNTER — Ambulatory Visit (INDEPENDENT_AMBULATORY_CARE_PROVIDER_SITE_OTHER): Payer: Medicare Other | Admitting: Pharmacist

## 2013-10-02 ENCOUNTER — Telehealth: Payer: Self-pay

## 2013-10-02 DIAGNOSIS — J441 Chronic obstructive pulmonary disease with (acute) exacerbation: Secondary | ICD-10-CM | POA: Diagnosis not present

## 2013-10-02 DIAGNOSIS — I4891 Unspecified atrial fibrillation: Secondary | ICD-10-CM | POA: Diagnosis not present

## 2013-10-02 DIAGNOSIS — G20 Parkinson's disease: Secondary | ICD-10-CM | POA: Diagnosis not present

## 2013-10-02 DIAGNOSIS — I2510 Atherosclerotic heart disease of native coronary artery without angina pectoris: Secondary | ICD-10-CM | POA: Diagnosis not present

## 2013-10-02 DIAGNOSIS — Z7901 Long term (current) use of anticoagulants: Secondary | ICD-10-CM

## 2013-10-02 DIAGNOSIS — R269 Unspecified abnormalities of gait and mobility: Secondary | ICD-10-CM | POA: Diagnosis not present

## 2013-10-02 DIAGNOSIS — E119 Type 2 diabetes mellitus without complications: Secondary | ICD-10-CM | POA: Diagnosis not present

## 2013-10-02 DIAGNOSIS — Z79899 Other long term (current) drug therapy: Secondary | ICD-10-CM | POA: Diagnosis not present

## 2013-10-02 LAB — PROTIME-INR: INR: 3 — AB (ref 0.9–1.1)

## 2013-10-02 NOTE — Telephone Encounter (Signed)
Faxed order to [facility] to [phone]:  09/18/13:  "D/C previous coumadin orders Increase to 4.5 mg po daily Recheck PT/INR on 09/25/13"

## 2013-10-05 ENCOUNTER — Telehealth: Payer: Self-pay

## 2013-10-05 DIAGNOSIS — J441 Chronic obstructive pulmonary disease with (acute) exacerbation: Secondary | ICD-10-CM | POA: Diagnosis not present

## 2013-10-05 DIAGNOSIS — I2510 Atherosclerotic heart disease of native coronary artery without angina pectoris: Secondary | ICD-10-CM | POA: Diagnosis not present

## 2013-10-05 DIAGNOSIS — G20 Parkinson's disease: Secondary | ICD-10-CM | POA: Diagnosis not present

## 2013-10-05 DIAGNOSIS — E119 Type 2 diabetes mellitus without complications: Secondary | ICD-10-CM | POA: Diagnosis not present

## 2013-10-05 DIAGNOSIS — R269 Unspecified abnormalities of gait and mobility: Secondary | ICD-10-CM | POA: Diagnosis not present

## 2013-10-05 DIAGNOSIS — I4891 Unspecified atrial fibrillation: Secondary | ICD-10-CM | POA: Diagnosis not present

## 2013-10-05 NOTE — Telephone Encounter (Signed)
Faxed signed order to [facility] at [phone]:  "D/c previous coumadin orders Increase to 4.5mg  po daily Recheck PT/INR on 09/25/13"

## 2013-10-06 ENCOUNTER — Encounter: Payer: Self-pay | Admitting: Cardiovascular Disease

## 2013-10-08 DIAGNOSIS — E119 Type 2 diabetes mellitus without complications: Secondary | ICD-10-CM | POA: Diagnosis not present

## 2013-10-08 DIAGNOSIS — G20 Parkinson's disease: Secondary | ICD-10-CM | POA: Diagnosis not present

## 2013-10-08 DIAGNOSIS — R269 Unspecified abnormalities of gait and mobility: Secondary | ICD-10-CM | POA: Diagnosis not present

## 2013-10-08 DIAGNOSIS — J441 Chronic obstructive pulmonary disease with (acute) exacerbation: Secondary | ICD-10-CM | POA: Diagnosis not present

## 2013-10-08 DIAGNOSIS — I2510 Atherosclerotic heart disease of native coronary artery without angina pectoris: Secondary | ICD-10-CM | POA: Diagnosis not present

## 2013-10-08 DIAGNOSIS — I4891 Unspecified atrial fibrillation: Secondary | ICD-10-CM | POA: Diagnosis not present

## 2013-10-09 ENCOUNTER — Ambulatory Visit (INDEPENDENT_AMBULATORY_CARE_PROVIDER_SITE_OTHER): Payer: Medicare Other | Admitting: Pharmacist

## 2013-10-09 ENCOUNTER — Encounter: Payer: Self-pay | Admitting: Cardiovascular Disease

## 2013-10-09 DIAGNOSIS — Z79899 Other long term (current) drug therapy: Secondary | ICD-10-CM | POA: Diagnosis not present

## 2013-10-09 DIAGNOSIS — I4891 Unspecified atrial fibrillation: Secondary | ICD-10-CM | POA: Diagnosis not present

## 2013-10-09 DIAGNOSIS — E119 Type 2 diabetes mellitus without complications: Secondary | ICD-10-CM | POA: Diagnosis not present

## 2013-10-09 DIAGNOSIS — Z7901 Long term (current) use of anticoagulants: Secondary | ICD-10-CM

## 2013-10-09 DIAGNOSIS — G20 Parkinson's disease: Secondary | ICD-10-CM | POA: Diagnosis not present

## 2013-10-09 DIAGNOSIS — R269 Unspecified abnormalities of gait and mobility: Secondary | ICD-10-CM | POA: Diagnosis not present

## 2013-10-09 DIAGNOSIS — J441 Chronic obstructive pulmonary disease with (acute) exacerbation: Secondary | ICD-10-CM | POA: Diagnosis not present

## 2013-10-09 DIAGNOSIS — I2510 Atherosclerotic heart disease of native coronary artery without angina pectoris: Secondary | ICD-10-CM | POA: Diagnosis not present

## 2013-10-09 LAB — PROTIME-INR: INR: 3 — AB (ref 0.9–1.1)

## 2013-10-13 DIAGNOSIS — E119 Type 2 diabetes mellitus without complications: Secondary | ICD-10-CM | POA: Diagnosis not present

## 2013-10-13 DIAGNOSIS — G20 Parkinson's disease: Secondary | ICD-10-CM | POA: Diagnosis not present

## 2013-10-13 DIAGNOSIS — I2510 Atherosclerotic heart disease of native coronary artery without angina pectoris: Secondary | ICD-10-CM | POA: Diagnosis not present

## 2013-10-13 DIAGNOSIS — J441 Chronic obstructive pulmonary disease with (acute) exacerbation: Secondary | ICD-10-CM | POA: Diagnosis not present

## 2013-10-13 DIAGNOSIS — R269 Unspecified abnormalities of gait and mobility: Secondary | ICD-10-CM | POA: Diagnosis not present

## 2013-10-13 DIAGNOSIS — I4891 Unspecified atrial fibrillation: Secondary | ICD-10-CM | POA: Diagnosis not present

## 2013-10-15 DIAGNOSIS — I4891 Unspecified atrial fibrillation: Secondary | ICD-10-CM | POA: Diagnosis not present

## 2013-10-15 DIAGNOSIS — E119 Type 2 diabetes mellitus without complications: Secondary | ICD-10-CM | POA: Diagnosis not present

## 2013-10-15 DIAGNOSIS — R269 Unspecified abnormalities of gait and mobility: Secondary | ICD-10-CM | POA: Diagnosis not present

## 2013-10-15 DIAGNOSIS — I2510 Atherosclerotic heart disease of native coronary artery without angina pectoris: Secondary | ICD-10-CM | POA: Diagnosis not present

## 2013-10-15 DIAGNOSIS — J441 Chronic obstructive pulmonary disease with (acute) exacerbation: Secondary | ICD-10-CM | POA: Diagnosis not present

## 2013-10-15 DIAGNOSIS — G20 Parkinson's disease: Secondary | ICD-10-CM | POA: Diagnosis not present

## 2013-10-19 DIAGNOSIS — G20 Parkinson's disease: Secondary | ICD-10-CM | POA: Diagnosis not present

## 2013-10-19 DIAGNOSIS — I4891 Unspecified atrial fibrillation: Secondary | ICD-10-CM | POA: Diagnosis not present

## 2013-10-19 DIAGNOSIS — J441 Chronic obstructive pulmonary disease with (acute) exacerbation: Secondary | ICD-10-CM | POA: Diagnosis not present

## 2013-10-19 DIAGNOSIS — E119 Type 2 diabetes mellitus without complications: Secondary | ICD-10-CM | POA: Diagnosis not present

## 2013-10-19 DIAGNOSIS — R269 Unspecified abnormalities of gait and mobility: Secondary | ICD-10-CM | POA: Diagnosis not present

## 2013-10-19 DIAGNOSIS — I2510 Atherosclerotic heart disease of native coronary artery without angina pectoris: Secondary | ICD-10-CM | POA: Diagnosis not present

## 2013-10-23 ENCOUNTER — Ambulatory Visit (INDEPENDENT_AMBULATORY_CARE_PROVIDER_SITE_OTHER): Payer: Medicare Other | Admitting: Pharmacist

## 2013-10-23 DIAGNOSIS — I4891 Unspecified atrial fibrillation: Secondary | ICD-10-CM | POA: Diagnosis not present

## 2013-10-23 DIAGNOSIS — Z7901 Long term (current) use of anticoagulants: Secondary | ICD-10-CM | POA: Diagnosis not present

## 2013-10-23 DIAGNOSIS — E119 Type 2 diabetes mellitus without complications: Secondary | ICD-10-CM | POA: Diagnosis not present

## 2013-10-23 DIAGNOSIS — I2510 Atherosclerotic heart disease of native coronary artery without angina pectoris: Secondary | ICD-10-CM | POA: Diagnosis not present

## 2013-10-23 DIAGNOSIS — J441 Chronic obstructive pulmonary disease with (acute) exacerbation: Secondary | ICD-10-CM | POA: Diagnosis not present

## 2013-10-23 DIAGNOSIS — R269 Unspecified abnormalities of gait and mobility: Secondary | ICD-10-CM | POA: Diagnosis not present

## 2013-10-23 DIAGNOSIS — G20 Parkinson's disease: Secondary | ICD-10-CM | POA: Diagnosis not present

## 2013-10-23 LAB — PROTIME-INR: INR: 2.8 — AB (ref 0.9–1.1)

## 2013-10-27 DIAGNOSIS — E119 Type 2 diabetes mellitus without complications: Secondary | ICD-10-CM | POA: Diagnosis not present

## 2013-10-27 DIAGNOSIS — G20 Parkinson's disease: Secondary | ICD-10-CM | POA: Diagnosis not present

## 2013-10-27 DIAGNOSIS — R269 Unspecified abnormalities of gait and mobility: Secondary | ICD-10-CM | POA: Diagnosis not present

## 2013-10-27 DIAGNOSIS — I4891 Unspecified atrial fibrillation: Secondary | ICD-10-CM | POA: Diagnosis not present

## 2013-10-27 DIAGNOSIS — I2510 Atherosclerotic heart disease of native coronary artery without angina pectoris: Secondary | ICD-10-CM | POA: Diagnosis not present

## 2013-10-27 DIAGNOSIS — J441 Chronic obstructive pulmonary disease with (acute) exacerbation: Secondary | ICD-10-CM | POA: Diagnosis not present

## 2013-10-28 DIAGNOSIS — J441 Chronic obstructive pulmonary disease with (acute) exacerbation: Secondary | ICD-10-CM | POA: Diagnosis not present

## 2013-10-28 DIAGNOSIS — R269 Unspecified abnormalities of gait and mobility: Secondary | ICD-10-CM | POA: Diagnosis not present

## 2013-10-28 DIAGNOSIS — G20 Parkinson's disease: Secondary | ICD-10-CM | POA: Diagnosis not present

## 2013-10-28 DIAGNOSIS — I4891 Unspecified atrial fibrillation: Secondary | ICD-10-CM | POA: Diagnosis not present

## 2013-10-28 DIAGNOSIS — I2510 Atherosclerotic heart disease of native coronary artery without angina pectoris: Secondary | ICD-10-CM | POA: Diagnosis not present

## 2013-10-28 DIAGNOSIS — E119 Type 2 diabetes mellitus without complications: Secondary | ICD-10-CM | POA: Diagnosis not present

## 2013-11-06 ENCOUNTER — Ambulatory Visit (INDEPENDENT_AMBULATORY_CARE_PROVIDER_SITE_OTHER): Payer: Medicare Other | Admitting: Cardiovascular Disease

## 2013-11-06 ENCOUNTER — Encounter: Payer: Self-pay | Admitting: Cardiovascular Disease

## 2013-11-06 VITALS — BP 136/72 | HR 62 | Ht 70.0 in | Wt 217.2 lb

## 2013-11-06 DIAGNOSIS — I2510 Atherosclerotic heart disease of native coronary artery without angina pectoris: Secondary | ICD-10-CM | POA: Diagnosis not present

## 2013-11-06 DIAGNOSIS — I10 Essential (primary) hypertension: Secondary | ICD-10-CM

## 2013-11-06 DIAGNOSIS — Z7901 Long term (current) use of anticoagulants: Secondary | ICD-10-CM | POA: Diagnosis not present

## 2013-11-06 DIAGNOSIS — E785 Hyperlipidemia, unspecified: Secondary | ICD-10-CM

## 2013-11-06 DIAGNOSIS — I4891 Unspecified atrial fibrillation: Secondary | ICD-10-CM

## 2013-11-06 NOTE — Patient Instructions (Signed)
You are doing well. Please hold the warfarin OK to d/c the advair and spiriva  Please call us if you have new issues that need to be addressed before your next appt.  Your physician wants you to follow-up in: 6 months.  You will receive a reminder letter in the mail two months in advance. If you don't receive a letter, please call our office to schedule the follow-up appointment.

## 2013-11-06 NOTE — Progress Notes (Signed)
Patient ID: Joel Chalifoux., male    DOB: 1940/12/02, 73 y.o.   MRN: [id number]  HPI Comments: Joel Wilson is a 73 year old man who lives at [facility] with CAD, hypertension, admission to the hospital for near syncope found to be in atrial fibrillation, status post stenting of the ramus artery in 2003, hypertension and hyperlipidemia.  known history of back pain with previous neck surgery. He has significant neuropathy and balance problems and thus he uses a walker.   admitted to the hospital 08/29/2013 with upper respiratory infection. On Christmas day had tachycardia, worse on 08/28/2013. He had near syncope. In the hospital he was started on amiodarone and converted to normal sinus rhythm. He was febrile on arrival was 100.5 temperature He was given Tamiflu at discharge.  Since hospital discharge, the patient has not had any further chest pain.  Cardiac enzymes negative to his hospital course He was discharged on amiodarone 200 mg twice a day, diltiazem 240 mg daily.   On his last clinic visit, we decrease the dose of amiodarone given symptoms of nausea. Symptoms have resolved and he feels well on today's visit. He denies any tachycardia or symptoms concerning for arrhythmia. No lightheadedness or dizziness. Gait is unsteady and he uses a walker. No recent falls. The nursing home is no longer checking his warfarin and he needs to transport to our clinic.  Carotid ultrasound in the hospital 08/31/2013 showing mild plaque bilaterally less than 50%  Echocardiogram 08/29/2013 showing normal ejection fraction greater than 60%, mild LVH, moderate mitral valve calcification   Prior cardiac catheterization  which showed no evidence of obstructive coronary artery disease. The stent in the ramus was patent with mild instent restenosis. There was a mild stenosis in the proximal LAD and ejection fraction was normal.  EKG shows normal sinus rhythm with rate 62 beats per minute,  nonspecific ST and T wave abnormality anterior precordial leads     Outpatient Encounter Prescriptions as of 11/06/2013  Medication Sig  . acetaminophen (TYLENOL) 325 MG tablet Take 650 mg by mouth every 6 (six) hours as needed for pain.  Marland Kitchen amiodarone (PACERONE) 200 MG tablet Take 200 mg by mouth daily.   . calcium carbonate (OS-CAL) 600 MG TABS Take 600 mg by mouth daily.  . cholecalciferol (VITAMIN D) 1000 UNITS tablet Take 1,000 Units by mouth daily.  . clopidogrel (PLAVIX) 75 MG tablet Take 75 mg by mouth daily.  Marland Kitchen diltiazem (CARDIZEM CD) 240 MG 24 hr capsule Take 240 mg by mouth daily.   . finasteride (PROSCAR) 5 MG tablet Take 5 mg by mouth daily.  Marland Kitchen gabapentin (NEURONTIN) 300 MG capsule Take 300 mg by mouth 3 (three) times daily.  . metoprolol tartrate (LOPRESSOR) 25 MG tablet Take 25 mg by mouth 2 (two) times daily.  . nitroGLYCERIN (NITROSTAT) 0.4 MG SL tablet Place 0.4 mg under the tongue every 5 (five) minutes as needed for chest pain.  Marland Kitchen ondansetron (ZOFRAN) 4 MG tablet Take 4 mg by mouth as needed for nausea.  Marland Kitchen oxycodone (OXY-IR) 5 MG capsule Take 5 mg by mouth every 6 (six) hours as needed.  . SENNA CO by Combination route. Takes 2 tablets twice daily.  . simvastatin (ZOCOR) 80 MG tablet Take 40 mg by mouth at bedtime.  Marland Kitchen SPIRIVA HANDIHALER 18 MCG inhalation capsule Place 18 mcg into inhaler and inhale daily.   . tamsulosin (FLOMAX) 0.4 MG CAPS Take 0.4 mg by mouth daily.  . Triamcinolone Acetonide (  TRIAMCINOLONE 0.1 % CREAM : EUCERIN) CREA Apply 1 application topically daily.  Marland Kitchen warfarin (COUMADIN) 3 MG tablet     Review of Systems  Constitutional: Negative.   HENT: Negative.   Eyes: Negative.   Respiratory: Negative.   Cardiovascular: Negative.   Gastrointestinal: Negative.   Endocrine: Negative.   Musculoskeletal: Positive for gait problem.  Skin: Negative.   Allergic/Immunologic: Negative.   Neurological: Negative.   Hematological: Negative.    Psychiatric/Behavioral: Negative.   All other systems reviewed and are negative.    BP 136/72  Pulse 62  Ht 5\' 10"  (1.778 m)  Wt 217 lb 4 oz (98.544 kg)  BMI 31.17 kg/m2  Physical Exam  Nursing note and vitals reviewed. Constitutional: He is oriented to person, place, and time. He appears well-developed and well-nourished.  HENT:  Head: Normocephalic.  Nose: Nose normal.  Mouth/Throat: Oropharynx is clear and moist.  Eyes: Conjunctivae are normal. Pupils are equal, round, and reactive to light.  Neck: Normal range of motion. Neck supple. No JVD present.  Cardiovascular: Normal rate, regular rhythm, S1 normal, S2 normal, normal heart sounds and intact distal pulses.  Exam reveals no gallop and no friction rub.   No murmur heard. Pulmonary/Chest: Effort normal and breath sounds normal. No respiratory distress. He has no wheezes. He has no rales. He exhibits no tenderness.  Scant crackles in the right base  Abdominal: Soft. Bowel sounds are normal. He exhibits no distension. There is no tenderness.  Musculoskeletal: Normal range of motion. He exhibits no edema and no tenderness.  Lymphadenopathy:    He has no cervical adenopathy.  Neurological: He is alert and oriented to person, place, and time. Coordination normal.  Skin: Skin is warm and dry. No rash noted. No erythema.  Psychiatric: He has a normal mood and affect. His behavior is normal. Judgment and thought content normal.      Assessment and Plan

## 2013-11-06 NOTE — Assessment & Plan Note (Signed)
No further arrhythmia. Maintaining normal sinus rhythm. We will hold the warfarin as recent episode was his first in the setting of upper respiratory infection. No further arrhythmia since that time. We'll continue his other medications including low-dose amiodarone.

## 2013-11-06 NOTE — Assessment & Plan Note (Signed)
Currently with no symptoms of angina. No further workup at this time. Continue current medication regimen. 

## 2013-11-06 NOTE — Assessment & Plan Note (Signed)
Blood pressure is well controlled on today's visit. No changes made to the medications. 

## 2013-11-06 NOTE — Assessment & Plan Note (Signed)
Recommended he stay on simvastatin. Goal LDL less than 70

## 2013-11-06 NOTE — Assessment & Plan Note (Signed)
Warfarin has been held. high fall risk. No recurrent arrhythmia

## 2013-11-11 ENCOUNTER — Ambulatory Visit (INDEPENDENT_AMBULATORY_CARE_PROVIDER_SITE_OTHER): Payer: Medicare Other

## 2013-11-11 DIAGNOSIS — Z7901 Long term (current) use of anticoagulants: Secondary | ICD-10-CM | POA: Diagnosis not present

## 2013-11-11 DIAGNOSIS — I4891 Unspecified atrial fibrillation: Secondary | ICD-10-CM

## 2013-11-11 LAB — POCT INR: INR: 1.1

## 2013-12-22 DIAGNOSIS — B351 Tinea unguium: Secondary | ICD-10-CM | POA: Diagnosis not present

## 2013-12-22 DIAGNOSIS — M79609 Pain in unspecified limb: Secondary | ICD-10-CM | POA: Diagnosis not present

## 2014-03-03 NOTE — Telephone Encounter (Signed)
This encounter was created in error - please disregard.

## 2014-03-10 DIAGNOSIS — B351 Tinea unguium: Secondary | ICD-10-CM | POA: Diagnosis not present

## 2014-03-10 DIAGNOSIS — M79609 Pain in unspecified limb: Secondary | ICD-10-CM | POA: Diagnosis not present

## 2014-07-22 DIAGNOSIS — M79672 Pain in left foot: Secondary | ICD-10-CM | POA: Diagnosis not present

## 2014-07-22 DIAGNOSIS — B351 Tinea unguium: Secondary | ICD-10-CM | POA: Diagnosis not present

## 2014-07-22 DIAGNOSIS — M79675 Pain in left toe(s): Secondary | ICD-10-CM | POA: Diagnosis not present

## 2014-11-05 ENCOUNTER — Ambulatory Visit: Payer: Medicare Other | Admitting: Cardiovascular Disease

## 2014-11-16 DIAGNOSIS — M79675 Pain in left toe(s): Secondary | ICD-10-CM | POA: Diagnosis not present

## 2014-11-16 DIAGNOSIS — B351 Tinea unguium: Secondary | ICD-10-CM | POA: Diagnosis not present

## 2014-11-16 DIAGNOSIS — M79674 Pain in right toe(s): Secondary | ICD-10-CM | POA: Diagnosis not present

## 2014-12-03 ENCOUNTER — Ambulatory Visit: Payer: Medicare Other | Admitting: Cardiovascular Disease

## 2014-12-03 ENCOUNTER — Encounter: Payer: Self-pay | Admitting: *Deleted

## 2014-12-24 NOTE — H&P (Signed)
PATIENT NAME:  Joel Wilson, Joel Wilson MR#:  [id number] DATE OF BIRTH:  [birth year]  DATE OF ADMISSION:  11/12/2012  PRESENTING COMPLAINT:  Chest pain.   PRIMARY CARE PHYSICIAN:  [facility] [place]: [place], [place].   The patient is a 74 year old morbidly obese Caucasian gentleman with a history of CAD, status post stent in 2003, history of hypertension, who comes to the Emergency Room from  [place] complaining of chest pain on-and-off for the past two weeks. The patient said it got worse yesterday. It woke him up in the middle of the day, and he decided to come to the Emergency Room. He was kept in the Emergency Room. Three sets of cardiac enzymes were negative, and his EKG was normal. He was supposed to get a Myoview stress test from the ER; however, he started having chest pain this morning which radiated to his left arm, and a repeat EKG was done which showed flipped T waves in the anterolateral leads. Internal Medicine was consulted for further evaluation and management. The patient is hemodynamically stable, is being admitted for chest pain, rule out MI.   PAST MEDICAL HISTORY: 1.  Hypertension.  2.  History of coronary artery disease, status post stent in 2003; details not known. [facility]. 3.  Neck surgery.   MEDICATIONS: 1.  Zofran 4 mg as needed.  2.  Vitamin D3 1000 international units daily.  3.  Tylenol 325 two tablets every 6 hours.  4.  Tamsulosin 0.4 mg 1 capsule daily.  5.  Simvastatin 40 mg daily.  6.  Senna 2 tablets daily.  7.  Plavix 75 mg daily.  8.  Oxycodone 5 mg 1 tablet every 6  hours.  9.  Nitrostat 0.4 mg sublingual every 5 minutes.  10.  Metoprolol 25 mg b.i.d.  11. Lisinopril 20 mg daily.  12.  Hydrochlorothiazide 12.5 p.o. daily.  13.  Gabapentin 300 mg, 3 capsules 3 times a day.  14.  Finasteride 5 mg daily.  15.  Calcium with vitamin D, 1 tablet daily.   FAMILY HISTORY: Positive for coronary artery disease.   SOCIAL  HISTORY: A resident at [facility]. No smoking or alcohol.   ALLERGIES: TO ASPIRIN.   REVIEW OF SYSTEMS:  CONSTITUTIONAL: No fever, fatigue, weakness.  EYES: No blurred or double vision.  ENT: No tinnitus, ear pain, hearing loss.  RESPIRATORY: No cough, wheeze, hemoptysis.  CARDIOVASCULAR: Positive for chest pain and hypertension. No arrhythmia or palpitations. No syncope.  GASTROINTESTINAL: No nausea, vomiting, diarrhea or abdominal pain.  GENITOURINARY: No dysuria or hematuria.  ENDOCRINE: No polyuria or nocturia.  HEMATOLOGY: No anemia or easy bruising.  SKIN: No acne or rash.  MUSCULOSKELETAL: Positive for arthritis.  NEUROLOGIC: No CVA or TIA.  PSYCHIATRIC: No anxiety or depression.   All other systems reviewed and negative.   EKG shows normal sinus rhythm. Repeat EKG from today shows flipped T waves in anterolateral leads. Cardiac enzymes x3 negative. CK total is 280, MB is 4.3.   CBC within normal limits.   Comprehensive metabolic panel within normal limits except creatinine of 1.3, glucose of 125, BUN of 29, and LFTs within normal limits.   CHEST X-RAY: No acute cardiopulmonary abnormality.   ASSESSMENT: 74 year-old patient with history of coronary artery disease, status post stent in 2003, with history of hypertension, comes in with:   1.  Unstable angina, with history of coronary artery disease status post stent x 1 in 2003: The  patient has abnormal EKG with flipped T waves and ongoing chest pain. Will continue aspirin, beta blockers, nitroglycerin paste. Added Lovenox, treatment-dose. Dr. Rockey Situ plans to do a catheterization given continued symptoms and risk factors. The patient will be admitted on telemetry floor. Will  give him prudent diet.  2.  Hypertension: Continue beta blockers.  3.  Hyperlipidemia: On statins.  4.  Benign prostatic hypertrophy: Continue Tamsulosin.  5.  Deep vein thrombosis prophylaxis. Is on Plavix and Lovenox.  6.  Further workup according to the patient's clinical course.   Hospital admission plan was discussed with the patient.   Plan was also discussed with Dr. Rockey Situ, who is going to be consulted from a Cardiology standpoint.    Time spent: 50 minutes.     ____________________________ Hart Rochester Posey Pronto, MD sap:dm D: 11/12/2012 15:15:00 ET T: 11/12/2012 15:40:40 ET JOB#: [id number]  cc: [facility]  [facility]) Sona A. Posey Pronto, MD, <Dictator>  Ilda Basset MD ELECTRONICALLY SIGNED 11/13/2012 15:12

## 2014-12-24 NOTE — Consult Note (Signed)
General Aspect 74 year old Caucasian male residing in [facility] assisted living with a past medical history of coronary artery disease, hypertension,  presenting with a chief complaint of near syncope.Cardiology consulted placed for atrial fibrillation.    For the past two days he has been feeling dizzy with tachycardia, also with some dry cough associated with low-grade fever for the past two days.  racing of his heart started noon on Chrostmas, continued since then. Friday 08/28/13, heartv racing got worse and he almost fell, but denies any loss of consciousness.  Assisted living staff called EMS and the patient is brought into the ER.  The patient denies any chest pain or shortness of breath.  No other complaints.   The patient is from [facility].    In the ER, the patient was found to be in atrial fibrillation with RVR. he was given Cardizem 20 mg IV x 2 and diltiazem 30 mg by mouth once with no significant improvement, then started on Cardizem drip.  The patient also received 1 liter of fluid bolus. He was febrile on arrival with 100.5 degrees. Chest x-ray revealed no acute findings.   Physical Exam:  GEN well developed, well nourished, no acute distress   HEENT hearing intact to voice, moist oral mucosa   NECK supple    RESP normal resp effort  clear BS    CARD Irregular rate and rhythm  Murmur    Murmur Systolic    Systolic Murmur Out flow    ABD soft  normal BS    LYMPH negative neck   EXTR negative edema   SKIN normal to palpation   NEURO motor/sensory function intact   PSYCH alert, A+O to time, place, person, good insight   Review of Systems:  Subjective/Chief Complaint heart5 racing, dizzy/near syncope   General: Fatigue  Weakness    Skin: No Complaints    ENT: No Complaints    Eyes: No Complaints    Neck: No Complaints    Respiratory: No Complaints    Cardiovascular: Palpitations    Gastrointestinal: No Complaints    Genitourinary: No  Complaints    Vascular: No Complaints    Musculoskeletal: No Complaints    Neurologic: Dizzness  Fainting    Hematologic: No Complaints    Endocrine: No Complaints    Psychiatric: No Complaints    Review of Systems: All other systems were reviewed and found to be negative    Medications/Allergies Reviewed Medications/Allergies reviewed      HTN:    cardiac stents:    neck surgery:        Admit Diagnosis:   NEAR SYNCOPE: Onset Date: 29-Aug-2013, Status: Active, Description: NEAR SYNCOPE  Home Medications: Medication Instructions Status  gabapentin 300 mg oral capsule 1 cap(s) orally 3 times a day Active  Plavix 75 mg oral tablet once a day  Active  lisinopril 20 mg oral tablet 1 tab(s) orally once a day Active  Vitamin D3 1000 intl units oral capsule 1 cap(s) orally once a day Active  Tylenol 325 mg oral tablet 2 tab(s) orally every 6 hours, As Needed - for Pain Active  Zofran 4 mg oral tablet 1 tab(s) orally once a day, As Needed - for Nausea, Vomiting Active  Senna 1.2  mg orally 3 times a day Active  finasteride 5 mg oral tablet 1 tab(s) orally once a day Active  Nitrostat 0.4 mg sublingual tablet 1 tab(s) sublingual every 5 minutes, As Needed- for Chest Pain  Active  metoprolol tartrate 25 mg oral tablet 1 tab(s) orally 2 times a day Active  simvastatin 80 mg oral tablet 0.5 tab(s) orally once a day (at bedtime) Active  tamsulosin 0.4 mg oral capsule 1 cap(s) orally once a day Active  oxycodone 5 mg oral tablet 1 tab(s) orally every 6 hours Active  calcium 600+ D 400 1  orally once a day Active  hydrochlorothiazide 12.5 mg oral tablet 1 tab(s) orally once a day Active   Lab Results:  Thyroid:  27-Dec-14 01:15   Thyroid Stimulating Hormone  0.212 (0.45-4.50 (International Unit)  ----------------------- Pregnant patients have  different reference  ranges for TSH:  - - - - - - - - - -  Pregnant, first trimetser:  0.36 - 2.50 uIU/mL)  Hepatic:  27-Dec-14  01:15   Bilirubin, Total 0.6  Alkaline Phosphatase  30 (45-117 NOTE: New Reference Range 07/24/13)  SGPT (ALT) 22  SGOT (AST) 22  Total Protein, Serum 6.9  Albumin, Serum  3.2  Routine Chem:  27-Dec-14 01:15   Glucose, Serum  120  BUN 14  Creatinine (comp) 1.25  Sodium, Serum 139  Potassium, Serum 3.7  Chloride, Serum 107  CO2, Serum 25  Calcium (Total), Serum  8.3  Osmolality (calc) 279  eGFR (African American) >60  eGFR (Non-African American)  57 (eGFR values <50m/min/1.73 m2 may be an indication of chronic kidney disease (CKD). Calculated eGFR is useful in patients with stable renal function. The eGFR calculation will not be reliable in acutely ill patients when serum creatinine is changing rapidly. It is not useful in  patients on dialysis. The eGFR calculation may not be applicable to patients at the low and high extremes of body sizes, pregnant women, and vegetarians.)  Anion Gap 7  Cholesterol, Serum 98  Triglycerides, Serum 163  HDL (INHOUSE)  28  VLDL Cholesterol Calculated 33  LDL Cholesterol Calculated 37 (Result(s) reported on 29 Aug 2013 at 02:06AM.)  Cardiac:  27-Dec-14 01:15   Troponin I 0.04 (0.00-0.05 0.05 ng/mL or less: NEGATIVE  Repeat testing in 3-6 hrs  if clinically indicated. >0.05 ng/mL: POTENTIAL  MYOCARDIAL INJURY. Repeat  testing in 3-6 hrs if  clinically indicated. NOTE: An increase or decrease  of 30% or more on serial  testing suggests a  clinically important change)  CK, Total 111  CPK-MB, Serum 2.1 (Result(s) reported on 29 Aug 2013 at 02:18AM.)  Routine Coag:  27-Dec-14 01:15   Activated PTT (APTT) 29.7 (A HCT value >55% may artifactually increase the APTT. In one study, the increase was an average of 19%. Reference: "Effect on Routine and Special Coagulation Testing Values of Citrate Anticoagulant Adjustment in Patients with High HCT Values." American Journal of Clinical Pathology 2006;126:400-405.)  Routine Hem:  27-Dec-14  01:15   WBC (CBC) 8.0  RBC (CBC)  4.13  Hemoglobin (CBC) 13.1  Hematocrit (CBC)  38.5  Platelet Count (CBC) 160  MCV 93  MCH 31.7  MCHC 34.1  RDW 13.7  Neutrophil % 76.8  Lymphocyte % 9.1  Monocyte % 13.5  Eosinophil % 0.2  Basophil % 0.4  Neutrophil # 6.2  Lymphocyte #  0.7  Monocyte #  1.1  Eosinophil # 0.0  Basophil # 0.0 (Result(s) reported on 29 Aug 2013 at 01:51AM.)   EKG:  Interpretation EKG shows atrial fibrillation at 104 bpm, no significant ST or T wave changes   Radiology Results: XRay:    26-Dec-14 19:22, Chest Portable Single View  Chest Portable Single View  REASON FOR EXAM:    New A-Fib, diaphoresis  COMMENTS:       PROCEDURE: DXR - DXR PORTABLE CHEST SINGLE VIEW  - Aug 28 2013  7:22PM     CLINICAL DATA:  Will atrial fibrillation.  Diaphoresis    EXAM:  PORTABLE CHEST - 1 VIEW    COMPARISON:  11/11/2012    FINDINGS:  The heart size and mediastinal contours are within normal limits.  Both lungs are clear. The visualized skeletal structures are  unremarkable.     IMPRESSION:  No active disease.      Electronically Signed    By: Kerby Moors M.D.    On: 08/28/2013 19:27         Verified By: Angelita Ingles, M.D.,    ASA: Anaphylaxis  ASA: Rash  Vital Signs/Nurse's Notes: **Vital Signs.:   27-Dec-14 05:00  Vital Signs Type Routine  Temperature Temperature (F) 101.3  Celsius 38.5  Temperature Source oral  Pulse Pulse 82  Respirations Respirations 25  Systolic BP Systolic BP 166  Diastolic BP (mmHg) Diastolic BP (mmHg) 59  Mean BP 82  Pulse Ox % Pulse Ox % 97  Pulse Ox Activity Level  At rest  Oxygen Delivery 3L; Nasal Cannula  Pulse Ox Heart Rate 39    Impression 74 year old Caucasian male residing in [place] assisted living with a past medical history of coronary artery disease, hypertension,  presenting with a chief complaint of near syncope.Cardiology consulted placed for atrial fibrillation.    1) atrial  fibrillation: new onset on christmas day at around noon elevated rate on arrival, symptoms at home. Started non anticoagulation on arrival evening of 12/26 (under 48 hour time) this am still in atrial fib with better rate control on metoprolol and diltiazem --will add amiodarone in attempt to convert to NSR, 400 mg po q6 hrs --could change heparin to eliquis 5 mg po BID --echo pending to rule out structural heart disease  2) Near Syncope From atrial fibrillation with RVR on arrival Rate improved,  will try to restore NSR while an inpatient  3)CAD cardiac enz neg continue plavic, no recent chest pain concverning for angina  4)HTN: on diltiazem, b-blocker,  could hold HCTZ   Electronic Signatures: Ida Rogue (MD)  (Signed 27-Dec-14 09:42)  Authored: General Aspect/Present Illness, History and Physical Exam, Review of System, Past Medical History, Health Issues, Home Medications, Labs, EKG , Radiology, Allergies, Vital Signs/Nurse's Notes, Impression/Plan   Last Updated: 27-Dec-14 09:42 by Ida Rogue (MD)

## 2014-12-24 NOTE — Consult Note (Signed)
General Aspect 74 year old morbidly obese Caucasian gentleman with a history of CAD, status post stent in 2003, history of hypertension, who comes to the Emergency Room from  [facility] complaining of chest pain on-and-off for te past month. Cardiology was consulted for chest pain, change in EKG while in the ER.  The patien reports pain got worse yesterday. It woke him up in the middle of the day, and he decided to come to the Emergency Room. Pain has been stuttering on and off with exertion over the past month. Feels similar to when he had a stent placed ten years ago. He was kept in the Emergency Room through the day.   Three sets of cardiac enzymes were negative, and his EKG initially showed nonspecific T wave ABN.  He continued to have chest pain in the ER despite negative cardiac enz and EKG showed progression of his T wave inversions, and he was admitted. He also reported  radiation to his left arm.    PAST MEDICAL HISTORY: 1.  Hypertension.  2.  History of coronary artery disease, status post stent in 2003; details not known. [facility]. 3.  Neck surgery.   Present Illness . FAMILY HISTORY: Positive for coronary artery disease.   SOCIAL HISTORY: A resident at [facility]. No smoking or alcohol.   ALLERGIES: TO ASPIRIN.   Physical Exam:  GEN well developed, well nourished, no acute distress   HEENT red conjunctivae   NECK supple  No masses   RESP normal resp effort  clear BS   CARD Regular rate and rhythm  No murmur   ABD denies tenderness  soft   EXTR negative edema   SKIN normal to palpation   NEURO motor/sensory function intact   PSYCH alert, A+O to time, place, person, good insight   Review of Systems:  Subjective/Chief Complaint chest pain, dull, waxing and waning   General: Fatigue  Weakness   Skin: No Complaints   ENT: No Complaints   Eyes: No Complaints   Neck: No Complaints   Respiratory: No  Complaints   Cardiovascular: Chest pain or discomfort   Gastrointestinal: No Complaints   Genitourinary: No Complaints   Vascular: No Complaints   Musculoskeletal: No Complaints   Neurologic: No Complaints   Hematologic: No Complaints   Endocrine: No Complaints   Psychiatric: No Complaints   Review of Systems: All other systems were reviewed and found to be negative   Medications/Allergies Reviewed Medications/Allergies reviewed     HTN:    cardiac stents:    neck surgery:        Admit Diagnosis:   UNSTABLE ANGINA: Onset Date: 12-Nov-2012, Status: Active, Description: UNSTABLE ANGINA  Home Medications: Medication Instructions Status  Plavix 75 mg oral tablet once a day  Active  Tylenol 325 mg oral tablet 2  orally every 6 hours, As Needed Active  Zofran 4 mg oral tablet 1  orally once a day, As Needed Active  finasteride 5 mg oral tablet 1 tab(s) orally once a day Active  Nitrostat 0.4 mg sublingual tablet 1 tab(s) sublingual every 5 minutes, As Needed- for Chest Pain  Active  metoprolol tartrate 25 mg oral tablet 1 tab(s) orally 2 times a day Active  simvastatin 80 mg oral tablet 0.5 tab(s) orally once a day (at bedtime) Active  tamsulosin 0.4 mg oral capsule 1 cap(s) orally once a day Active  gabapentin 300 mg oral capsule 3 cap(s) orally 3 times a day Active  oxycodone 5 mg oral tablet 1 tab(s) orally every 6 hours Active  calcium 600+ D 400 1  orally once a day Active  hydrochlorothiazide 12.5 mg oral tablet 1 tab(s) orally once a day Active  lisinopril 20 mg oral tablet 1 tab(s) orally once a day Active  Vitamin D3 1000 intl units oral tablet 1 tab(s) orally once a day Active  Senna - oral tablet 0.6 2  orally 2 times a day Active   Lab Results:  Hepatic:  11-Mar-14 05:05   Bilirubin, Total 0.7  Alkaline Phosphatase  40  SGPT (ALT) 27  SGOT (AST) 26  Total Protein, Serum 7.4  Albumin, Serum  3.2  Routine Chem:  11-Mar-14 05:05   Glucose, Serum  125   BUN  29  Creatinine (comp)  1.34  Sodium, Serum 138  Potassium, Serum 3.9  Chloride, Serum 107  CO2, Serum 24  Calcium (Total), Serum 8.8  Osmolality (calc) 283  eGFR (African American) >60  eGFR (Non-African American)  53 (eGFR values <16mL/min/1.73 m2 may be an indication of chronic kidney disease (CKD). Calculated eGFR is useful in patients with stable renal function. The eGFR calculation will not be reliable in acutely ill patients when serum creatinine is changing rapidly. It is not useful in  patients on dialysis. The eGFR calculation may not be applicable to patients at the low and high extremes of body sizes, pregnant women, and vegetarians.)  Anion Gap 7  Cardiac:  11-Mar-14 05:05   Troponin I < 0.02 (0.00-0.05 0.05 ng/mL or less: NEGATIVE  Repeat testing in 3-6 hrs  if clinically indicated. >0.05 ng/mL: POTENTIAL  MYOCARDIAL INJURY. Repeat  testing in 3-6 hrs if  clinically indicated. NOTE: An increase or decrease  of 30% or more on serial  testing suggests a  clinically important change)  CK, Total  280  CPK-MB, Serum  4.3 (Result(s) reported on 11 Nov 2012 at 05:30AM.)  Routine Coag:  11-Mar-14 05:05   Prothrombin 13.6  INR 1.0 (INR reference interval applies to patients on anticoagulant therapy. A single INR therapeutic range for coumarins is not optimal for all indications; however, the suggested range for most indications is 2.0 - 3.0. Exceptions to the INR Reference Range may include: Prosthetic heart valves, acute myocardial infarction, prevention of myocardial infarction, and combinations of aspirin and anticoagulant. The need for a higher or lower target INR must be assessed individually. Reference: The Pharmacology and Management of the Vitamin K  antagonists: the seventh ACCP Conference on Antithrombotic and Thrombolytic Therapy. PIRJJ.8841 Sept:126 (3suppl): [id number]. A HCT value >55% may artifactually increase the PT.  In one study,  the  increase was an average of 25%. Reference:  "Effect on Routine and Special Coagulation Testing Values of Citrate Anticoagulant Adjustment in Patients with High HCT Values." American Journal of Clinical Pathology 2006;126:400-405.)  Activated PTT (APTT) 32.0 (A HCT value >55% may artifactually increase the APTT. In one study, the increase was an average of 19%. Reference: "Effect on Routine and Special Coagulation Testing Values of Citrate Anticoagulant Adjustment in Patients with High HCT Values." American Journal of Clinical Pathology 2006;126:400-405.)  Routine Hem:  11-Mar-14 05:05   WBC (CBC) 7.1  RBC (CBC)  4.14  Hemoglobin (CBC) 13.0  Hematocrit (CBC)  38.2  Platelet Count (CBC) 212 (Result(s) reported on 11 Nov 2012 at 05:20AM.)  MCV 92  MCH 31.5  MCHC 34.1  RDW 13.7   EKG:  Interpretation EKG shows NSR with T wave inversion in V2 to V6,  I an AVL   Radiology Results: XRay:    11-Mar-14 04:45, Chest Portable Single View  Chest Portable Single View   REASON FOR EXAM:    Chest Pain  COMMENTS:       PROCEDURE: DXR - DXR PORTABLE CHEST SINGLE VIEW  - Nov 11 2012  4:45AM     RESULT: Comparison is made to the study of September 15, 2011.    The lungs are adequately inflated. The interstitial markings are somewhat   coarse at the lung bases. There is no alveolar infiltrate. The pulmonary   vascularity is not clearly engorged. The cardiac silhouette is normal in   size. There is mild tortuosity of the descending thoracic aorta. The bony   thorax exhibits no acute abnormality where visualized.    IMPRESSION:  There is no definite evidence of acute cardiopulmonary   abnormality. I cannot exclude minimal basilar atelectasis especially on     the left. A followup PA and lateral chest x-ray would be ofvalue.     Dictation Site: 1        Verified By: DAVID A. Martinique, M.D., MD    ASA: Anaphylaxis  ASA: Rash  Vital Signs/Nurse's Notes: **Vital Signs.:   12-Mar-14 19:44   Vital Signs Type Routine  Temperature Temperature (F) 97.8  Celsius 36.5  Temperature Source oral  Pulse Pulse 77  Respirations Respirations 18  Systolic BP Systolic BP 355  Diastolic BP (mmHg) Diastolic BP (mmHg) 63  Mean BP 78  Pulse Ox % Pulse Ox % 91  Pulse Ox Activity Level  At rest  Oxygen Delivery Room Air/ 21 %    Impression 74 year old morbidly obese Caucasian gentleman with a history of CAD, status post stent in 2003, history of hypertension and falls, who comes to the Emergency Room from  [place] complaining of chest pain on-and-off for the past month. Cardiology was consulted for chest pain, change in EKG while in the ER.  1) Chest pain/abn ekg Concerning for angina, initial CK and MB mildly elevated After a long discussion with the patient, he would like to have a cardiac cath as he reports sx are similar to prior angina episodes 10 years ago. -Dramatic T wave inversions -Would continue on lovenox, hold at 3 am tomorrw AM for cath -Continue asa, b-blocker if tolerated., statin as he is taking as an outpt  2.  Hypertension:  Continue beta blockers.   3.  Hyperlipidemia:  On statins.   4.  Benign prostatic hypertrophy:   5.  Deep vein thrombosis prophylaxis.   on Plavix and Lovenox.   Electronic Signatures: Ida Rogue (MD)  (Signed 12-Mar-14 20:15)  Authored: General Aspect/Present Illness, History and Physical Exam, Review of System, Past Medical History, Health Issues, Home Medications, Labs, EKG , Radiology, Allergies, Vital Signs/Nurse's Notes, Impression/Plan   Last Updated: 12-Mar-14 20:15 by Ida Rogue (MD)

## 2014-12-24 NOTE — H&P (Signed)
PATIENT NAME:  Joel Wilson, Joel Wilson MR#:  [id number] DATE OF BIRTH:  1941-08-24  DATE OF ADMISSION:  08/29/2013  PRIMARY CARE PHYSICIAN:  At  [place]:  Dizziness and racing of the heart.   HISTORY OF PRESENT ILLNESS:  The patient is a 74 year old Caucasian male residing in [place] assisted living with a past medical history of coronary artery disease, hypertension, is presenting to the ER with a chief complaint of near syncope.  The patient is reporting that for the past two days he has been feeling dizzy and his heart was racing up.  Also, he has been having some dry cough associated with low-grade fever for the past two days.  Denies any sick contacts, but he lives in assisted living.  Eventually racing of his heart has gotten worse and today he almost fell, but denies any loss of consciousness.  Assisted living staff has called EMS and the patient is brought into the ER.  The patient's brother initially came into the ER to help the patient.  In the ER, the patient was found to be in atrial fibrillation with RVR.  The patient was given Cardizem 20 mg IV x 2 and diltiazem 30 mg by mouth once with no significant improvement.  Eventually the patient is started on Cardizem drip.  The patient has received 1 liter of fluid bolus also.  As the patient was febrile with 100.5 degrees Fahrenheit Tylenol was given and pan cultures were ordered.  Chest x-ray has revealed no acute findings.  The patient denies any chest pain or shortness of breath during my examination.  No other complaints.  No family members at bedside.  The patient is actually from [facility].  The ER physician tried to transfer the patient from the ER, but [facility] physician has recommended to keep the patient here as he is unstable.   PAST MEDICAL HISTORY:  Hypertension, history of coronary artery disease status post stent in 2003 done in [place].  The patient officially meets criteria for COPD as he had a 40 pack-year  smoking history.   PAST SURGICAL HISTORY:  Coronary artery disease status post stent placement, neck surgery.   ALLERGIES:  HE IS ALLERGIC TO ASPIRIN.   PSYCHOSOCIAL HISTORY:  Lives at assisted living.  Quit smoking, but smoked for 40 years and he used to smoke 1 pack a day.  Denies alcohol or illicit drug use.   FAMILY HISTORY:  Coronary artery disease runs in his family.   HOME MEDICATIONS:  Zofran 4 mg by mouth once a day as needed, Tylenol 325 mg by mouth q. 6 hours, tamsulosin 0.4 mg by mouth once daily, simvastatin 80 mg one half once a day, senna 1.2 mg 3 times a day, Plavix 75 mg once daily, oxycodone 5 mg by mouth q. 6 hours, Nitrostat as needed, metoprolol 25 mg 2 times a day, lisinopril 20 mg by mouth once daily, hydrochlorothiazide 12.5 mg once daily, gabapentin 300 mg 3 times a day, calcium vitamin D, once daily.   REVIEW OF SYSTEMS: CONSTITUTIONAL:  Denies any weight loss or weight gain, but complaining of fever and fatigue.  EYES:  Denies blurry vision, double vision.  EARS, NOSE, THROAT:  Denies epistaxis, discharge.  RESPIRATORY:  Denies hemoptysis, but complaining of dry cough for the past two days.  CARDIOVASCULAR:  Denies any chest pain, but had palpitations.  GASTROINTESTINAL:  Denies nausea, vomiting, diarrhea, abdominal pain.  GENITOURINARY:  No dysuria or hematuria.   ENDOCRINE:  Denies polyuria, nocturia or thyroid problems. HEMATOLOGIC AND LYMPHATIC:  No anemia, easy bruising, bleeding.  INTEGUMENT:  Denies rash, lesions.  MUSCULOSKELETAL:  Denies any neck pain, back pain, shoulder pain.  NEUROLOGIC:  Denies any vertigo, ataxia.  PSYCHIATRIC:  No ADD, OCD.   PHYSICAL EXAMINATION: VITAL SIGNS:  Temperature 100.5, pulse 84, respirations 18, blood pressure 114/64, pulse ox is 95% on 2 liters.  GENERAL APPEARANCE:  Not under acute distress.  Moderately built and nourished.  HEENT:  Normocephalic, atraumatic.  Pupils are equal, reacting light and accommodation.  No  scleral icterus.  No conjunctival injection.  No sinus tenderness.  Nares are congested.  No sinus tenderness.  Moist mucous membranes.  No postnasal drip.  NECK:  Supple.  No JVD.  Range of motion is intact.  LUNGS:  Minimal end expiratory wheezing is present.  No accessory muscle usage.  Moderate air entry is present.  No crackles.  CARDIOVASCULAR:  Irregularly irregular.  No murmurs.  GASTROINTESTINAL:  Soft.  Bowel sounds are positive in all four quadrants.  Nontender, nondistended.  No hepatosplenomegaly.  No masses felt.  NEUROLOGIC:  Awake, alert, oriented x 3.  Cranial nerves II through XII are intact.  Motor and sensory are grossly intact.  Reflexes are 2+.  EXTREMITIES:  No edema.  No cyanosis.  No clubbing.  SKIN:  Warm to touch.  Normal turgor.  No rashes.  No lesions.  PSYCHIATRIC:  Normal mood and affect.   LABORATORY AND IMAGING STUDIES:  Glucose is 133, BUN 15, creatinine 1.37, sodium 135, potassium 3.6, chloride 104, CO2 23, GFR 50.  Anion gap 8, serum osmolality 273.  Calcium is 9.1.  LFTs are normal except alkaline phosphatase which is low.  Troponin less than 0.02.  CBC is normal.  Left hip x-ray, no fracture or dislocation or acute findings.  Chest x-ray, portable:  No active disease.  Influenza test is ordered which is pending at this time.  A 12 lead EKG:  Atrial fibrillation with RVR at 104 beats per minute, nonspecific ST-T wave changes.   ASSESSMENT AND PLAN:  A 74 year old Caucasian male presenting to the ER with a two day history of dizziness and palpitations associated with low-grade fever will be admitted with the following assessment and plan.  1.  Near-syncope from new onset atrial fibrillation with rapid ventricular response.  We will admit him to CCU stepdown.  Continue Cardizem drip.  We will provide him heparin bolus and start him on heparin drip.  The patient will be started on Cardizem by mouth also.  Cardiology consult is placed to Dr. Rockey Situ as the patient was  seen by Dr. Rockey Situ in the past.   Cycle cardiac biomarkers.  THE PATIENT IS ALLERGIC TO ASPIRIN.  We will resume his home medications Plavix and metoprolol.  2.  Acute viral syndrome.  Pan cultures were obtained.  We will provide symptomatic treatment.  White count is not elevated.  At this point I am not considering any prophylactic antibiotics.  3.  Hypertension.  Resume his home medication.  Blood pressure is stable.  4.  Coronary artery disease.  THE PATIENT BEING ALLERGIC TO ASPIRIN, we will give him Plavix. 5.  We will provide him gastrointestinal and deep vein thrombosis prophylaxis with heparin.  6.  HE IS FULL CODE.  Brother is medical power of attorney.   Diagnosis and plan of care was discussed in detail with the patient.  He is aware of the plan.   Total critical care  time spent  is 50 minutes.     ____________________________ Nicholes Mango, MD ag:ea D: 08/29/2013 00:46:34 ET T: 08/29/2013 03:59:24 ET JOB#: [id number]  cc: Nicholes Mango, MD, <Dictator> Nicholes Mango MD ELECTRONICALLY SIGNED 09/02/2013 3:01

## 2014-12-24 NOTE — Discharge Summary (Signed)
PATIENT NAME:  Joel Wilson, WHELAN MR#:  [id number] DATE OF BIRTH:  1940-11-17  DATE OF ADMISSION:  11/12/2012 DATE OF DISCHARGE:  11/13/2012  PRIMARY CARE PHYSICIAN:  [facility].  CARDIOLOGY:  Dr. Rockey Situ.  CHIEF COMPLAINT: Chest pain.   DISCHARGE DIAGNOSES: 1.  Unstable angina, resolved.  2.  Coronary artery disease, status post stent x1 in 2003.  3.  Hypertension.  4.  Obesity.   CONDITION ON DISCHARGE: Fair.   VITALS: Stable.   PROCEDURES: Cardiac cath done by Dr. Rockey Situ showed a patent ramus stent with mild-to-moderate in-stent restenosis estimated at 40%, otherwise mild luminal irregularities in the LAD and left circumflex. Normal EF estimated more than 55%. No MR or AS.  Cardiac enzymes x3 negative.   BUN 24, creatinine 1.36. Troponin x3 negative.   MEDICATIONS AT DISCHARGE: 1.  Plavix 75 mg daily.  2.  Finasteride 5 mg daily.  3.  Nitrostat 0.4 mg sublingual, as needed.  4.  Metoprolol 25 mg b.i.d.  5.  Simvastatin 40 mg daily at bedtime.  6.  Tamsulosin 0.4 mg daily.  7.  Gabapentin 300 mg, 3 capsules 3 times a day.  8.  Oxycodone 5 mg 1 tablet every six hours.  9.  Calcium with vitamin D, 1 tablet daily.  10.  Hydrochlorothiazide 12.5, one tablet daily.  11.  Lisinopril 20 mg daily.  12.  Vitamin D 3000 international units daily.  13.  Senna 2 tablets twice a day.  14.  Tylenol 325, two tablets every 6 hours as needed.  15.  Zofran 4 mg 1 tablet as needed.  16.  Plavix 75 mg daily.   DIET: Low sodium.   FOLLOWUP: With [facility]ologist at [facility] or Dr. Rockey Situ in 2 to 3 weeks. Follow up with your PCP in [facility].   [place] COURSE: The patient is a 74 year old Caucasian gentleman with a history of coronary artery disease, comes in with ongoing chest pain for the last 2 weeks, more so in the last couple of days. He was admitted with:   1.  Unstable angina, with history of CAD, stent x1 in 2003. He did have 3 sets of negative cardiac enzymes.  However, in the Emergency Room the patient had a repeat EKG with chest pain episode which showed flipped T waves and ongoing chest pain. He was started on Lovenox treatment dose, continue aspirin, beta blockers and nitroglycerin paste. Dr. Rockey Situ performed cardiac cath, and it did not show any significant CAD. The patient tolerated the procedure well. He was hemodynamically stable.  2.  Hyperlipidemia: Statins were continued.  3.  BPH: On Tamsulosin.   The patient will be discharged to [facility], and will follow up with  [facility] [facility] in 1 to 2 weeks.   Time spent: 40 minutes.    ____________________________ Hart Rochester Posey Pronto, MD sap:dm D: 11/13/2012 14:41:00 ET T: 11/13/2012 15:20:18 ET JOB#: [id number]  cc: [facility]  [facility]) Sona A. Posey Pronto, MD, <Dictator>   Ilda Basset MD ELECTRONICALLY SIGNED 11/29/2012 13:03

## 2014-12-25 NOTE — Discharge Summary (Signed)
PATIENT NAME:  Joel Wilson, Joel Wilson MR#:  [id number] DATE OF BIRTH:  [birth year]  DATE OF ADMISSION:  08/29/2013 DATE OF DISCHARGE:  09/01/2013  ADMITTING DIAGNOSIS: Near syncope.   DISCHARGE DIAGNOSIS:   1.  Systemic inflammatory response syndrome.  2.  Influenza type A.  3.  Atrial fibrillation with rapid ventricular response, normal sinus rhythm.  4.  Chronic obstructive pulmonary disease exacerbation due to influenza.  5.  Generalized weakness.     DISCHARGE CONDITION: Stable.   DISCHARGE MEDICATIONS: The patient is to resume her Maxzide 5 mg p.o. daily, Nitrostat 0.4 mg sublingually every 5 minutes as needed, tamsulosin 0.4 mg daily, oxycodone 5 mg every six hours as needed, calcium with vitamin D 1 tablet daily, gabapentin 300 mg 3 times daily, vitamin D31000 units once daily, acetaminophen 325 mg 2 tablets every six hours as needed, Zofran 4 mg p.o. daily as needed, Senna 1to 2 tablets 3 times daily, amiodarone 200 mg p.o. twice daily this is a new medication, Coumadin 3 mg p.o. daily, atorvastatin 10 mg p.o. daily, Plavix 75 mg orally daily, Tamiflu 75 mg p.o. twice daily for three more doses, fluticasone salmeterol 250/50 one puff twice daily, ipratropium one inhalation once daily, Combivent Respimat  100/20, one puff 4 times daily, diltiazem 240 mg p.o. daily, guaifenesin 300 mg p.o. twice daily, Tussionex 5 ml twice daily, dexamethasone/guaifenesin 10 mL every 8 hours as needed.   HOME HEALTH: Physical therapy, nurse as well as nurse aide.   Home Oxygen: None.   DIET: 2 grams salt, low fat, low cholesterol, regular consistency.   ACTIVITY LIMITATIONS: As tolerated.   REFERRAL: To home health,  physical therapy, nurse as well as nurse aid.    FOLLOWUP APPOINTMENT: With Dr. Rockey Situ in two days after discharge. The patient was also advised to have his pro-time and INR checked in one day and report this to [facility].    CONSULTANTS: Dr. Rockey Situ,  Dr. Acie Fredrickson,   care management,  social work, Dr. Virl Axe.    RADIOLOGIC STUDIES: Chest x-ray, portable, single view on the 12/26/204, showed no active disease.   Left hip complete x-ray, the 08/28/2013, revealed no fracture or dislocation or any other acute finding.   Chest x-ray PA and lateral, 08/31/2013, revealed subsegmental atelectasis in the left lung base and thoracic spondylosis.   Carotid artery  Doppler 08/31/2013, showed mild partially calcified plaque noted in both proximal internal carotid arteries consistent with less than 50% stenosis based on Doppler criteria.    Echocardiogram 08/29/2013, showed ejection fraction by visual estimation 70% to 75%, normal global left ventricular systolic function, mild left ventricular hypertrophy, mildly increased left ventricular septal thickness, moderate mitral calcification, mildly increased left ventricular posterior wall thickness.   HOSPITAL COURSE:  The patient is a 74 year old Caucasian male with past medical history significant for history of coronary artery disease, history of hypertension, who presents to the hospital with complaints of dizziness as well as racing heart and presents with near syncopal episode. He was also complaining of cough and mild fevers.  On arrival to the Emergency Room his temperature was 100.5, pulse 84, respiratory rate was 18, blood pressure 114/64, saturation was 95% on 2 liters of oxygen through nasal cannula. Physical exam was unremarkable. The patient's EKG showed atrial fibrillation with RVR, at 104 beats per minute and non-specific ST-T changes. Lab data done in the Emergency Room 08/28/2013, revealed creatinine of 1.37, glucose 133, sodium 135, otherwise BMP was unremarkable. The patient's liver enzymes were  unremarkable. Cardiac enzymes x 3 were within normal limits. TSH was low at 0.212; however, the patient's free thyroxine was normal at 1.11. CBC: White blood cell count was normal at 9.4. Hemoglobin was 14.3, platelet count was 183,  absolute neutrophil count was unremarkable. The patient's blood cultures taken on the 08/29/2013, showed no growth. Urinalysis was normal.  EKG as mentioned above revealed atrial fibrillation at rate of 104. The patient was admitted to the hospital for further evaluation. He was initiated on IV Cardizem drip as well as metoprolol orally and then later on the amiodarone. He converted to sinus rhythm; however, after some period of time, he converted back into atrial fibrillation and with addition of high dose of Cardizem, he converted him back to sinus rhythm. He was continued on a heparin IV drip because of risks of thromboembolic process. He was evaluated by cardiologist, Dr. Virl Axe Dr. Rockey Situ as well as Dr.  Acie Fredrickson, who followed the patient along daily. He was loaded with Coumadin. He was also loaded with amiodarone. On the day of discharge, he felt satisfactory, did not complain of any significant discomfort. He was in sinus rhythm. He is to continue on amiodarone orally as well as Cardizem orally.  He is also to continue Coumadin therapy. On the day of discharge, 09/01/2013, his pro-time are 19.7 and 1.7 respectively. The patient was advised to continue Coumadin therapy at 3 mg daily dose. He is to follow up with his cardiologist, Dr. Rockey Situ in the next few days after discharge and have his Coumadin adjusted according to the need. In regards to a near syncope, it was felt that the patient's near syncope could have been related to atrial fibrillation, rapid ventricular response. The patient had ultrasound of his carotids done, which was unremarkable, as well as echocardiogram most of them were unremarkable. No further interventions were done. For influenza, the patient as mentioned above, was febrile when he presented to the hospital. Influenza testing was positive for influenza type A. He was initiated on Tamiflu and was continued on Tamiflu it while he was in the hospital. It was felt that the patient had  systemic inflammatory response reaction due to influenza. He is to continue Tamiflu for the next three doses, through 9:00 p.m. on 09/02/2013 and then stop. This would complete his five-day course. The patient was noted to be in chronic obstructive pulmonary disease exacerbation. It was felt to be due to the flu.  The patient was continued on inhalation therapy while he was in the hospital, which helped him significantly.  The patient was complaining of some generalized weakness while he was in the hospital. Physical therapy recommended for home. Physical therapy saw him in the hospital and although, the patient was concerned about his abilities, still home health physical therapy was only recommended. He did not qualify for rehabilitation placement. He was agreeable to return to home. He is being discharged in stable condition with the above-mentioned medications and follow-up. On the day of discharge temperature is 98.3, pulse was 74, respiratory rate was 18 to 20, blood pressure 138/76, saturation was 93% to 94% up to 96% on room air at rest as well as on exertion.   TIME SPENT: 40 minutes on this patient.   ____________________________ Theodoro Grist, MD rv:cc D: 09/01/2013 18:26:01 ET T: 09/01/2013 23:39:01 ET JOB#: [id number]  cc: Theodoro Grist, MD, <Dictator> Minna Merritts, MD [place] MD ELECTRONICALLY SIGNED 09/15/2013 13:07

## 2014-12-26 NOTE — H&P (Signed)
PATIENT NAME:  Joel Wilson, Joel Wilson MR#:  [id number] DATE OF BIRTH:  [birth year]  DATE OF ADMISSION:  09/15/2011  PRIMARY CARE PHYSICIAN: [place]    CHIEF COMPLAINT: Fall with right hip pain last night.   HISTORY OF PRESENT ILLNESS: Joel Wilson is a 74 year old Caucasian gentleman has history of coronary artery disease status post stent, hypertension, and history of peripheral neuropathy who comes to the Emergency Room after he had a mechanical fall at home while returning back to his bed after using the bathroom in the middle of the night around 1:30 a.m. The patient says he uses a walker at home because of his peripheral neuropathy which is chronic and his right leg gave out. Before he could hold the walker, he landed on his right hip complaining of right hip pain. He received some IV pain medicine. He is not able to ambulate. Neither is he able to move his left hip and is being admitted for further evaluation and management. X-ray of the right hip and pelvis have been negative for any acute fracture.   ALLERGIES: Aspirin causes rash.   MEDICATIONS:  1. Calcium 600 with Vitamin D daily.  2. Finasteride 5 mg daily.  3. Gabapentin 300 mg 3 capsules 3 times a day.  4. Hydrochlorothiazide/lisinopril 12.5/20 p.o. daily.  5. Metoprolol 25 mg b.i.d.  6. Nitrostat 0.4 mg sublingual as needed.  7. Oxycodone 5 mg every six hours.  8. Plavix 75 mg daily.  9. Promethazine 25 mg every eight hours as needed.  10. Simvastatin 40 mg at bedtime.  11. Tamsulosin 0.4 mg p.o. daily.   SOCIAL HISTORY: The patient lives at home by himself. His wife recently passed away. He uses a walker to get around. Nonsmoker. He drives intermittently.    FAMILY HISTORY: Positive for hypertension.   REVIEW OF SYSTEMS: CONSTITUTIONAL: No fever. Positive for weakness. EYES: No blurred or double vision. ENT: No tinnitus, ear pain, hearing loss. RESPIRATORY: No cough, wheeze, hemoptysis. CARDIOVASCULAR: No chest pain, orthopnea,  edema. GI: No nausea, vomiting, diarrhea, abdominal pain. GU: No dysuria or hematuria. ENDOCRINE: No polyuria or nocturia. HEMATOLOGY: No anemia or easy bruising. SKIN: No acne or rash. MUSCULOSKELETAL: Positive for pain in the right hip. NEUROLOGIC: No cerebrovascular accident or transient ischemic attack. The patient does have peripheral neuropathy which is chronic. PSYCH: No anxiety or depression. All other systems reviewed and negative.   PHYSICAL EXAMINATION:   GENERAL: The patient is awake, alert, and oriented x3 not in acute distress.   VITAL SIGNS: Afebrile, pulse 77, blood pressure 137/66. Sats are 96% on room air.   HEENT: Atraumatic, normocephalic. Pupils equal, round, and reactive to light and accommodation. Extraocular movements intact. Oral mucosa is moist.   NECK: Supple. No JVD. No carotid bruit.   RESPIRATORY: Clear to auscultation bilaterally. No rales, rhonchi, respiratory distress, or labored breathing.   CARDIOVASCULAR: Both the heart sounds are normal. Rate, rhythm is regular. PMI not lateralized. Chest nontender.   EXTREMITIES: Good pedal pulses. Good femoral pulses. No lower extremity edema.   ABDOMEN: Soft, benign, nontender. No organomegaly.   MUSCULOSKELETAL: The patient is unable to flex his right hip. It is limited secondary to pain/contusion from the fall. Left lower extremity normal flexion and extended at the hip and knee joint.   SKIN: Warm and dry.   PSYCH: The patient is awake, alert, oriented x3.   LABORATORY, DIAGNOSTIC, AND RADIOLOGICAL DATA: X-ray of the pelvis, AP and right hip, are negative for any fracture. X-ray  of the chest no acute cardiopulmonary disease. Cardiac enzymes negative. Comprehensive metabolic panel within normal limits. CBC within normal limits.   ASSESSMENT: 74 year old Joel Wilson with: 1. Mechanical fall at home with right hip pain, unable to walk, failure to thrive. X-rays in the ER negative for fracture.  2. Right hip  contusion status post fall.  3. Hypertension.  4. Coronary artery disease, status post stent, on Plavix. 5. Suspected benign prostatic hypertrophy.  6. Bilateral peripheral neuropathy, chronic, uses walker to ambulate at home. The patient lives alone.   PLAN:  1. Admit patient to orthopedic floor.  2. Two gram sodium diet.  3. Will continue all home medications.  4. P.r.n. morphine injection q.6 along with oxycodone for pain.  5. Consider Valium for muscle relaxant if needed.  6. Physical therapy for gait ambulation and safety.  7. If the patient's pain does not improve, consider CT of the right hip.  8. Heparin for DVT prophylaxis.  9. Care Management for discharge planning. The patient will benefit from rehab.  10. Further work-up according to the patient's clinical course.   Hospital admission plan was discussed with the patient.   CODE STATUS: The patient is a FULL CODE. No family members are present in the ER.   TIME SPENT: 45 minutes.   ____________________________ Hart Rochester Posey Pronto, MD sap:drc D: 09/15/2011 11:40:34 ET T: 09/15/2011 12:07:09 ET JOB#: [id number]  cc: Shontavia Mickel A. Posey Pronto, MD, <Dictator>, [facility] Ilda Basset MD ELECTRONICALLY SIGNED 09/18/2011 15:25

## 2015-07-19 ENCOUNTER — Emergency Department: Payer: Medicare Other

## 2015-07-19 ENCOUNTER — Encounter: Payer: Self-pay | Admitting: Emergency Medicine

## 2015-07-19 ENCOUNTER — Emergency Department
Admission: EM | Admit: 2015-07-19 | Discharge: 2015-07-19 | Disposition: A | Discharge status: Home / Self Care | Payer: Medicare Other | Attending: Student | Admitting: Student

## 2015-07-19 DIAGNOSIS — R51 Headache: Secondary | ICD-10-CM | POA: Diagnosis not present

## 2015-07-19 DIAGNOSIS — W1839XA Other fall on same level, initial encounter: Secondary | ICD-10-CM | POA: Diagnosis not present

## 2015-07-19 DIAGNOSIS — Z7902 Long term (current) use of antithrombotics/antiplatelets: Secondary | ICD-10-CM | POA: Insufficient documentation

## 2015-07-19 DIAGNOSIS — S0990XA Unspecified injury of head, initial encounter: Secondary | ICD-10-CM | POA: Diagnosis not present

## 2015-07-19 DIAGNOSIS — M542 Cervicalgia: Secondary | ICD-10-CM | POA: Diagnosis not present

## 2015-07-19 DIAGNOSIS — W19XXXA Unspecified fall, initial encounter: Secondary | ICD-10-CM | POA: Diagnosis not present

## 2015-07-19 DIAGNOSIS — Y9389 Activity, other specified: Secondary | ICD-10-CM | POA: Diagnosis not present

## 2015-07-19 DIAGNOSIS — M545 Low back pain: Secondary | ICD-10-CM | POA: Diagnosis not present

## 2015-07-19 DIAGNOSIS — Y9289 Other specified places as the place of occurrence of the external cause: Secondary | ICD-10-CM | POA: Insufficient documentation

## 2015-07-19 DIAGNOSIS — Y998 Other external cause status: Secondary | ICD-10-CM | POA: Diagnosis not present

## 2015-07-19 DIAGNOSIS — S34139A Unspecified injury to sacral spinal cord, initial encounter: Secondary | ICD-10-CM | POA: Diagnosis not present

## 2015-07-19 DIAGNOSIS — M533 Sacrococcygeal disorders, not elsewhere classified: Secondary | ICD-10-CM | POA: Diagnosis not present

## 2015-07-19 DIAGNOSIS — T148XXA Other injury of unspecified body region, initial encounter: Secondary | ICD-10-CM

## 2015-07-19 DIAGNOSIS — Z79899 Other long term (current) drug therapy: Secondary | ICD-10-CM | POA: Diagnosis not present

## 2015-07-19 DIAGNOSIS — S300XXA Contusion of lower back and pelvis, initial encounter: Secondary | ICD-10-CM | POA: Diagnosis not present

## 2015-07-19 DIAGNOSIS — I10 Essential (primary) hypertension: Secondary | ICD-10-CM | POA: Diagnosis not present

## 2015-07-19 DIAGNOSIS — T148 Other injury of unspecified body region: Secondary | ICD-10-CM | POA: Insufficient documentation

## 2015-07-19 DIAGNOSIS — R52 Pain, unspecified: Secondary | ICD-10-CM | POA: Diagnosis not present

## 2015-07-19 MED ORDER — MORPHINE SULFATE (PF) 4 MG/ML IV SOLN
4.0000 mg | Freq: Once | INTRAVENOUS | Status: AC
  Administered 2015-07-19: 4 mg via INTRAMUSCULAR
  Filled 2015-07-19: qty 1

## 2015-07-19 MED ORDER — OXYCODONE HCL 5 MG PO TABS: 5.0000 mg | ORAL_TABLET | Freq: Four times a day (QID) | ORAL | Status: DC | PRN

## 2015-07-19 MED ORDER — ONDANSETRON 4 MG PO TBDP
4.0000 mg | ORAL_TABLET | Freq: Once | ORAL | Status: AC
  Administered 2015-07-19: 4 mg via ORAL
  Filled 2015-07-19: qty 1

## 2015-07-19 NOTE — ED Notes (Addendum)
Called and spoke to [place], will call back if they are able to pick up the patient.

## 2015-07-19 NOTE — ED Provider Notes (Signed)
[facility] Emergency Department Provider Note  ____________________________________________  Time seen: Approximately 8:37 AM  I have reviewed the triage vital signs and the nursing notes.   HISTORY  Chief Complaint Fall and Tailbone Pain    HPI Joel Wilson. is a 74 y.o. male with coronary artery disease on Plavix, hypertension, hyperlipidemia results for evaluation of head pain as well as back and coccyx pain sudden onset, constant since onset, worsening, worse with movement x 3 days. Patient reports that he was ambulating with his walker 3 days ago when he suddenly lost his balance and fell backwards hitting his head. No loss of consciousness. He reports the walker landed on top of him. He has been having pain that is only intermittently improved with muscle relaxants. No chest pain or difficulty breathing. No vomiting, diarrhea. No numbness or weakness.   Past Medical History  Diagnosis Date  . Coronary artery disease     Stent in the proximal ramus artery in 2003. He presented in March of 2014 with possible unstable angina. Cardiac catheterization showed no evidence of obstructive coronary artery disease with only mild instent restenosis. There was 30% stenosis in the mid LAD. Ejection fraction was normal.  . Hypertension   . Hyperlipidemia     Patient Active Problem List   Diagnosis Date Noted  . Long term (current) use of anticoagulants 09/07/2013  . Atrial fibrillation ([place]) 09/04/2013  . Viral URI 09/04/2013  . Nausea 09/04/2013  . Coronary artery disease   . Hypertension   . Hyperlipidemia     Past Surgical History  Procedure Laterality Date  . Neck surgery    . Cardiac catheterization  11/13/12    [facility]; EF >55%  . Cardiac catheterization  2003  . Joint replacement      left hip    Current Outpatient Rx  Name  Route  Sig  Dispense  Refill  . acetaminophen (TYLENOL) 325 MG tablet   Oral   Take 650 mg by mouth every 6 (six)  hours as needed for mild pain or moderate pain.          Marland Kitchen amiodarone (PACERONE) 200 MG tablet   Oral   Take 200 mg by mouth at bedtime.         Marland Kitchen atorvastatin (LIPITOR) 80 MG tablet   Oral   Take 80 mg by mouth at bedtime.         . Calcium Carbonate-Vitamin D (CALCIUM 600+D) 600-400 MG-UNIT tablet   Oral   Take 1 tablet by mouth daily.         . chlorpheniramine-HYDROcodone (TUSSIONEX PENNKINETIC ER) 10-8 MG/5ML SUER   Oral   Take 5 mLs by mouth every 12 (twelve) hours as needed for cough.         . cholecalciferol (VITAMIN D) 1000 UNITS tablet   Oral   Take 1,000 Units by mouth daily.         . clopidogrel (PLAVIX) 75 MG tablet   Oral   Take 75 mg by mouth daily.         . cyclobenzaprine (FLEXERIL) 5 MG tablet   Oral   Take 5 mg by mouth at bedtime as needed for muscle spasms.         Marland Kitchen diltiazem (DILACOR XR) 240 MG 24 hr capsule   Oral   Take 240 mg by mouth at bedtime.         . finasteride (PROSCAR) 5 MG tablet  Oral   Take 5 mg by mouth daily.         . fluticasone (FLONASE) 50 MCG/ACT nasal spray   Each Nare   Place 1 spray into both nostrils daily.         Marland Kitchen gabapentin (NEURONTIN) 600 MG tablet   Oral   Take 1,200 mg by mouth 3 (three) times daily.         Marland Kitchen guaiFENesin-dextromethorphan (ROBITUSSIN DM) 100-10 MG/5ML syrup   Oral   Take 10 mLs by mouth every 8 (eight) hours as needed for cough.         Marland Kitchen lisinopril (PRINIVIL,ZESTRIL) 20 MG tablet   Oral   Take 20 mg by mouth daily.         . magnesium hydroxide (MILK OF MAGNESIA) 400 MG/5ML suspension   Oral   Take 30 mLs by mouth daily as needed for mild constipation or moderate constipation.         . metoprolol tartrate (LOPRESSOR) 25 MG tablet   Oral   Take 25 mg by mouth 2 (two) times daily.         . nitroGLYCERIN (NITROSTAT) 0.4 MG SL tablet   Sublingual   Place 0.4 mg under the tongue every 5 (five) minutes as needed for chest pain.         Marland Kitchen  ondansetron (ZOFRAN) 8 MG tablet   Oral   Take 8 mg by mouth 3 (three) times daily as needed for nausea.         Marland Kitchen oxyCODONE (OXY IR/ROXICODONE) 5 MG immediate release tablet   Oral   Take 5 mg by mouth every 6 (six) hours as needed for moderate pain or severe pain.         Marland Kitchen senna (SENOKOT) 8.6 MG tablet   Oral   Take 2 tablets by mouth 2 (two) times daily.         . Skin Protectants, Misc. (MINERIN) CREA   Topical   Apply 1 application topically daily. To legs and arms         . tamsulosin (FLOMAX) 0.4 MG CAPS   Oral   Take 0.4 mg by mouth daily.           Allergies Aspirin  Family History  Problem Relation Age of Onset  . Family history unknown: Yes    Social History Social History  Substance Use Topics  . Smoking status: Never Smoker   . Smokeless tobacco: None  . Alcohol Use: No    Review of Systems Constitutional: No fever/chills Eyes: No visual changes. ENT: No sore throat. Cardiovascular: Denies chest pain. Respiratory: Denies shortness of breath. Gastrointestinal: No abdominal pain.  No nausea, no vomiting.  No diarrhea.  No constipation. Genitourinary: Negative for dysuria. Musculoskeletal: + coccyx pain Skin: Negative for rash. Neurological: Negative for headaches, focal weakness or numbness.  10-point ROS otherwise negative.  ____________________________________________   PHYSICAL EXAM:  Filed Vitals:   07/19/15 0840  BP: 171/85  Pulse: 73  Temp: 97.4 F (36.3 C)  TempSrc: Oral  Resp: 20  Height: 5\' 11"  (1.803 m)  Weight: 210 lb (95.255 kg)  SpO2: 99%       Constitutional: Alert and oriented. In mild distress secondary to pain.  Eyes: Conjunctivae are normal. PERRL. EOMI. Head: Atraumatic. No bony step-off or deformity. Mild tenderness at the occiput. Nose: No congestion/rhinnorhea. Mouth/Throat: Mucous membranes are moist.  Oropharynx non-erythematous. Neck: No stridor. No midline C-spine tenderness to  palpation. Cardiovascular:  Normal rate, regular rhythm. Grossly normal heart sounds.  Good peripheral circulation. Respiratory: Normal respiratory effort.  No retractions. Lungs CTAB. Gastrointestinal: Soft and nontender. No distention. No abdominal bruits. No CVA tenderness. Genitourinary: deferred Musculoskeletal: No lower extremity tenderness nor edema.  No joint effusions. Moderate tenderness to palpation at the midline of the L-spine, sacrum and the coccyx. Neurologic:  Normal speech and language. No gross focal neurologic deficits are appreciated. No gait instability. Skin:  Skin is warm, dry and intact. No rash noted. Psychiatric: Mood and affect are normal. Speech and behavior are normal.  ____________________________________________   LABS (all labs ordered are listed, but only abnormal results are displayed)  Labs Reviewed - No data to display ____________________________________________  EKG  none ____________________________________________  RADIOLOGY  CT head and c-spine  IMPRESSION: No acute abnormality head or cervical spine.  Mild atrophy and chronic microvascular ischemic change.  Status post C4-5 and C6-7 fusion. Degenerative disease C3-4 and C5-6 does not appear changed.  Xray coccyx  IMPRESSION: Negative.  Xray lumbar spine  IMPRESSION: Multilevel degenerative disc disease. No acute bone abnormality.   ____________________________________________   PROCEDURES  Procedure(s) performed: None  Critical Care performed: No  ____________________________________________   INITIAL IMPRESSION / ASSESSMENT AND PLAN / ED COURSE  Pertinent labs & imaging results that were available during my care of the patient were reviewed by me and considered in my medical decision making (see chart for details).   Joel Wilson. is a 74 y.o. male with coronary artery disease on Plavix, hypertension, hyperlipidemia results for evaluation of head pain as  well as back and coccyx pain sudden onset, constant since onset, worsening, worse with movement x 3 days. On arrival, he appeared to be in pain however this is improved significantly with one dose of intramuscular morphine. He is neurologically intact. Imaging negative for any acute traumatic pathology. Suspect contusion. He reports he feels much better at this time. I discussed return precautions and need for close PCP follow-up and he is comfortable with the discharge plan. Will discharge with very short course of oxycodone which he takes chronically but has not received his mail-order prescription from the [place] yet. ____________________________________________   FINAL CLINICAL IMPRESSION(S) / ED DIAGNOSES  Final diagnoses:  Fall, initial encounter  Contusion  Pain, coccyx  Head injury, closed, initial encounter      Joanne Gavel, MD 07/19/15 1031

## 2015-07-19 NOTE — ED Notes (Signed)
Pt to CT/x-ray.

## 2015-07-19 NOTE — ED Notes (Signed)
Pt here via [facility] from [place], reports fell backwards on Saturday, hit back of head and landed on bottom. Pt denies LOC, reports he lost his balance. Pt alert and oriented, reports pain to lower back and bottom of neck.

## 2015-08-22 ENCOUNTER — Telehealth: Payer: Self-pay | Admitting: Cardiovascular Disease

## 2015-08-22 ENCOUNTER — Other Ambulatory Visit: Payer: Self-pay | Admitting: *Deleted

## 2015-08-22 MED ORDER — AMIODARONE HCL 200 MG PO TABS: 200.0000 mg | ORAL_TABLET | Freq: Every day | ORAL | Status: DC

## 2015-08-22 NOTE — Telephone Encounter (Signed)
°*  STAT* If patient is at the pharmacy, call can be transferred to refill team.   1. Which medications need to be refilled? Amiodarone 200 mg po daily at bedtime  2. Which pharmacy/location (including street and city if local pharmacy) is medication to be sent to?  [place]. [phone]  3. Do they need a 30 day or 90 day supply? [place]

## 2015-08-22 NOTE — Telephone Encounter (Signed)
Requested Prescriptions   Signed Prescriptions Disp Refills  . amiodarone (PACERONE) 200 MG tablet 90 tablet 0    Sig: Take 1 tablet (200 mg total) by mouth at bedtime.    Authorizing Provider: Minna Merritts    Ordering User: Britt Bottom

## 2015-08-22 NOTE — Telephone Encounter (Signed)
*  STAT* If patient is at the pharmacy, call can be transferred to refill team.   1. Which medications need to be refilled? Amiodarone 200 mg po daily at bedtime  2. Which pharmacy/location (including street and city if local pharmacy) is medication to be sent to? [place]. [phone]  3. Do they need a 30 day or 90 day supply? [place]

## 2015-08-23 DIAGNOSIS — B351 Tinea unguium: Secondary | ICD-10-CM | POA: Diagnosis not present

## 2015-08-23 DIAGNOSIS — M79675 Pain in left toe(s): Secondary | ICD-10-CM | POA: Diagnosis not present

## 2015-08-23 DIAGNOSIS — M79674 Pain in right toe(s): Secondary | ICD-10-CM | POA: Diagnosis not present

## 2015-09-30 ENCOUNTER — Encounter: Payer: Self-pay | Admitting: Nurse Practitioner

## 2015-09-30 ENCOUNTER — Ambulatory Visit (INDEPENDENT_AMBULATORY_CARE_PROVIDER_SITE_OTHER): Payer: Medicare Other | Admitting: Nurse Practitioner

## 2015-09-30 VITALS — BP 140/58 | HR 71 | Ht 71.0 in | Wt 216.5 lb

## 2015-09-30 DIAGNOSIS — I159 Secondary hypertension, unspecified: Secondary | ICD-10-CM | POA: Diagnosis not present

## 2015-09-30 DIAGNOSIS — E785 Hyperlipidemia, unspecified: Secondary | ICD-10-CM | POA: Diagnosis not present

## 2015-09-30 DIAGNOSIS — I4891 Unspecified atrial fibrillation: Secondary | ICD-10-CM | POA: Diagnosis not present

## 2015-09-30 DIAGNOSIS — I119 Hypertensive heart disease without heart failure: Secondary | ICD-10-CM | POA: Diagnosis not present

## 2015-09-30 DIAGNOSIS — I25119 Atherosclerotic heart disease of native coronary artery with unspecified angina pectoris: Secondary | ICD-10-CM

## 2015-09-30 DIAGNOSIS — I2510 Atherosclerotic heart disease of native coronary artery without angina pectoris: Secondary | ICD-10-CM

## 2015-09-30 DIAGNOSIS — I480 Paroxysmal atrial fibrillation: Secondary | ICD-10-CM | POA: Diagnosis not present

## 2015-09-30 MED ORDER — AMIODARONE HCL 200 MG PO TABS: 200.0000 mg | ORAL_TABLET | Freq: Every day | ORAL | Status: DC

## 2015-09-30 NOTE — Patient Instructions (Signed)
Medication Instructions:  Please continue your current medications  Labwork: None  Testing/Procedures: None  Follow-Up: Your physician wants you to follow-up in: 1 year with Dr. Cathie Hoops will receive a reminder letter in the mail two months in advance.  If you don't receive a letter, please call our office to schedule the follow-up appointment.   If you need a refill on your cardiac medications before your next appointment, please call your pharmacy.

## 2015-09-30 NOTE — Progress Notes (Signed)
Office Visit    Patient Name: Joel Wilson. Date of Encounter: 09/30/2015  Primary Care Provider:  [facility] Primary Cardiologist:  Johnny Bridge, MD   Chief Complaint    75 year old male with history of CAD and paroxysmal atrial fibrillation who presents for follow-up.  Past Medical History    Past Medical History  Diagnosis Date  . Coronary artery disease     a. 2003 PCI to Ramus;  b. 11/2012 Cath: LM nl, LAD 30, LCX nl, RI 40 ISR, RCA nl.  . Hypertensive heart disease     a. 08/2013 Echo: EF 70-75%, mild LVH.  Marland Kitchen Hyperlipidemia   . Paroxysmal atrial fibrillation ([facility])     a.08/2013-->amio;  b. CHA2DS2VASc = 4-->no longer on coumadin.   Past Surgical History  Procedure Laterality Date  . Neck surgery    . Cardiac catheterization  11/13/12    [facility]; EF >55%  . Cardiac catheterization  2003  . Joint replacement      left hip    Allergies  Allergies  Allergen Reactions  . Aspirin     History of Present Illness    75 year old male with the above complex past medical history. He has a history of CAD status post stenting of the ramus intermedius 2003. Subsequent catheterization in 2014 showed patency of that stent with otherwise nonobstructive disease. He also has a history of paroxysmal atrial fibrillation dating back to December 2014. He has been managed with amiodarone and has been in sinus rhythm since then. He had previously been on Coumadin but this was discontinued in 2015 as transportation to and from his assisted living for INR checks was an issue.  Since his last visit in 2015, he reports doing well. He continues to live in an assisted living and is sedentary. He says he spends most of his day in his recliner with his feet up and watching sports. He uses a walker to walk and notes that this is done on a limited basis. He does not express chest pain or dyspnea and further denies PND, orthopnea, dizziness, syncope, edema, or early satiety.  Home  Medications    Prior to Admission medications   Medication Sig Start Date End Date Taking? Authorizing Provider  acetaminophen (TYLENOL) 325 MG tablet Take 650 mg by mouth every 6 (six) hours as needed for mild pain or moderate pain.    Yes Historical Provider, MD  amiodarone (PACERONE) 200 MG tablet Take 1 tablet (200 mg total) by mouth at bedtime. 09/30/15  Yes Rogelia Mire, NP  atorvastatin (LIPITOR) 80 MG tablet Take 80 mg by mouth at bedtime.   Yes Historical Provider, MD  Calcium Carbonate-Vitamin D (CALCIUM 600+D) 600-400 MG-UNIT tablet Take 1 tablet by mouth daily.   Yes Historical Provider, MD  chlorpheniramine-HYDROcodone (TUSSIONEX PENNKINETIC ER) 10-8 MG/5ML SUER Take 5 mLs by mouth every 12 (twelve) hours as needed for cough.   Yes Historical Provider, MD  cholecalciferol (VITAMIN D) 1000 UNITS tablet Take 1,000 Units by mouth daily.   Yes Historical Provider, MD  clopidogrel (PLAVIX) 75 MG tablet Take 75 mg by mouth daily.   Yes Historical Provider, MD  diltiazem (DILACOR XR) 240 MG 24 hr capsule Take 240 mg by mouth at bedtime.   Yes Historical Provider, MD  finasteride (PROSCAR) 5 MG tablet Take 5 mg by mouth daily.   Yes Historical Provider, MD  fluticasone (FLONASE) 50 MCG/ACT nasal spray Place 1 spray into both nostrils daily.  Yes Historical Provider, MD  gabapentin (NEURONTIN) 600 MG tablet Take 1,200 mg by mouth 3 (three) times daily.   Yes Historical Provider, MD  guaiFENesin-dextromethorphan (ROBITUSSIN DM) 100-10 MG/5ML syrup Take 10 mLs by mouth every 8 (eight) hours as needed for cough.   Yes Historical Provider, MD  lisinopril (PRINIVIL,ZESTRIL) 20 MG tablet Take 20 mg by mouth daily.   Yes Historical Provider, MD  magnesium hydroxide (MILK OF MAGNESIA) 400 MG/5ML suspension Take 30 mLs by mouth daily as needed for mild constipation or moderate constipation.   Yes Historical Provider, MD  metoprolol tartrate (LOPRESSOR) 25 MG tablet Take 25 mg by mouth 2 (two)  times daily.   Yes Historical Provider, MD  nitroGLYCERIN (NITROSTAT) 0.4 MG SL tablet Place 0.4 mg under the tongue every 5 (five) minutes as needed for chest pain.   Yes Historical Provider, MD  ondansetron (ZOFRAN) 8 MG tablet Take 8 mg by mouth 3 (three) times daily as needed for nausea.   Yes Historical Provider, MD  oxyCODONE (OXY IR/ROXICODONE) 5 MG immediate release tablet Take 5 mg by mouth every 6 (six) hours as needed for moderate pain or severe pain.   Yes Historical Provider, MD  senna (SENOKOT) 8.6 MG tablet Take 2 tablets by mouth 2 (two) times daily.   Yes Historical Provider, MD  tamsulosin (FLOMAX) 0.4 MG CAPS Take 0.4 mg by mouth daily.   Yes Historical Provider, MD    Review of Systems    Overall does well though activity is limited. He denies chest pain, palpitations, dyspnea, pnd, orthopnea, n, v, dizziness, syncope, edema, weight gain, or early satiety. .  All other systems reviewed and are otherwise negative except as noted above.  Physical Exam    VS:  BP 140/58 mmHg  Pulse 71  Ht 5\' 11"  (1.803 m)  Wt 216 lb 8 oz (98.204 kg)  BMI 30.21 kg/m2 , BMI Body mass index is 30.21 kg/(m^2). GEN: Well nourished, well developed, in no acute distress. HEENT: normal.Poor dentition. Neck: Supple, no JVD, carotid bruits, or masses. Cardiac: RRR, no murmurs, rubs, or gallops. No clubbing, cyanosis, trace bilateral ankle edema.  Radials/DP/PT 2+ and equal bilaterally.  Respiratory:  Respirations regular and unlabored, clear to auscultation bilaterally. GI: Soft, nontender, nondistended, BS + x 4. MS: no deformity or atrophy. Skin: warm and dry, no rash. Neuro:  Strength and sensation are intact. Psych: Normal affect.  Accessory Clinical Findings    ECG - regular sinus rhythm, first-degree AV block, borderline LVH. No acute ST or T changes.  Assessment & Plan    1.  Coronary artery disease: Status post prior stenting of the ramus intermedius in 2003 with patency of that  stent in 2014. He otherwise has nonobstructive disease. He has not been having any chest pain or dyspnea on exertion. He remains on statin, Plavix, beta blocker, and when necessary nitrate therapy. He is not on aspirin secondary to allergy.  2. Paroxysmal atrial fibrillation: This was noted during admission in 2014. He has been on amiodarone since and this has been quiescent.  Continue beta blocker and calcium channel blocker. He is no longer on anticoagulation as this was stopped in 2015. We discussed that he will require surveillance LFTs, thyroid function testing, pulmonary function testing, and eye exams. He says that these are carried out at the [place] in [place].  3. Hypertensive heart disease: Blood pressure is moderately elevated today at 140/58. The assistant from the assisted living facility is here today and says  that pressures are typically in the 130s there. No changes today.  4. Hyperlipidemia: On high potency Lipitor therapy. Lipids are followed at the [place].  5. Disposition: Follow-up with Dr. Rockey Situ in one year or sooner if necessary.   Murray Hodgkins, NP 09/30/2015, 2:47 PM

## 2015-10-10 ENCOUNTER — Emergency Department: Payer: Medicare Other

## 2015-10-10 ENCOUNTER — Observation Stay
Admission: EM | Admit: 2015-10-10 | Discharge: 2015-10-12 | Disposition: A | Discharge status: Skilled Nursing Facility | Payer: Medicare Other | Attending: Internal Medicine | Admitting: Internal Medicine

## 2015-10-10 ENCOUNTER — Encounter: Payer: Self-pay | Admitting: Emergency Medicine

## 2015-10-10 DIAGNOSIS — R51 Headache: Secondary | ICD-10-CM | POA: Diagnosis not present

## 2015-10-10 DIAGNOSIS — M549 Dorsalgia, unspecified: Secondary | ICD-10-CM | POA: Insufficient documentation

## 2015-10-10 DIAGNOSIS — M4802 Spinal stenosis, cervical region: Secondary | ICD-10-CM | POA: Insufficient documentation

## 2015-10-10 DIAGNOSIS — R531 Weakness: Secondary | ICD-10-CM | POA: Insufficient documentation

## 2015-10-10 DIAGNOSIS — R918 Other nonspecific abnormal finding of lung field: Secondary | ICD-10-CM | POA: Diagnosis not present

## 2015-10-10 DIAGNOSIS — G319 Degenerative disease of nervous system, unspecified: Secondary | ICD-10-CM | POA: Insufficient documentation

## 2015-10-10 DIAGNOSIS — I160 Hypertensive urgency: Principal | ICD-10-CM | POA: Diagnosis present

## 2015-10-10 DIAGNOSIS — N400 Enlarged prostate without lower urinary tract symptoms: Secondary | ICD-10-CM | POA: Diagnosis not present

## 2015-10-10 DIAGNOSIS — Z886 Allergy status to analgesic agent status: Secondary | ICD-10-CM | POA: Diagnosis not present

## 2015-10-10 DIAGNOSIS — I440 Atrioventricular block, first degree: Secondary | ICD-10-CM | POA: Insufficient documentation

## 2015-10-10 DIAGNOSIS — M79605 Pain in left leg: Secondary | ICD-10-CM | POA: Insufficient documentation

## 2015-10-10 DIAGNOSIS — M542 Cervicalgia: Secondary | ICD-10-CM | POA: Diagnosis not present

## 2015-10-10 DIAGNOSIS — E785 Hyperlipidemia, unspecified: Secondary | ICD-10-CM | POA: Diagnosis not present

## 2015-10-10 DIAGNOSIS — M4806 Spinal stenosis, lumbar region: Secondary | ICD-10-CM | POA: Insufficient documentation

## 2015-10-10 DIAGNOSIS — I480 Paroxysmal atrial fibrillation: Secondary | ICD-10-CM | POA: Diagnosis not present

## 2015-10-10 DIAGNOSIS — Z7901 Long term (current) use of anticoagulants: Secondary | ICD-10-CM | POA: Diagnosis not present

## 2015-10-10 DIAGNOSIS — M5127 Other intervertebral disc displacement, lumbosacral region: Secondary | ICD-10-CM | POA: Insufficient documentation

## 2015-10-10 DIAGNOSIS — Z96642 Presence of left artificial hip joint: Secondary | ICD-10-CM | POA: Diagnosis not present

## 2015-10-10 DIAGNOSIS — M2578 Osteophyte, vertebrae: Secondary | ICD-10-CM | POA: Insufficient documentation

## 2015-10-10 DIAGNOSIS — M79602 Pain in left arm: Secondary | ICD-10-CM | POA: Insufficient documentation

## 2015-10-10 DIAGNOSIS — R29898 Other symptoms and signs involving the musculoskeletal system: Secondary | ICD-10-CM | POA: Diagnosis not present

## 2015-10-10 DIAGNOSIS — I119 Hypertensive heart disease without heart failure: Secondary | ICD-10-CM | POA: Insufficient documentation

## 2015-10-10 DIAGNOSIS — R110 Nausea: Secondary | ICD-10-CM | POA: Diagnosis not present

## 2015-10-10 DIAGNOSIS — G629 Polyneuropathy, unspecified: Secondary | ICD-10-CM | POA: Diagnosis not present

## 2015-10-10 DIAGNOSIS — I2510 Atherosclerotic heart disease of native coronary artery without angina pectoris: Secondary | ICD-10-CM | POA: Diagnosis not present

## 2015-10-10 DIAGNOSIS — M5489 Other dorsalgia: Secondary | ICD-10-CM

## 2015-10-10 DIAGNOSIS — Z8673 Personal history of transient ischemic attack (TIA), and cerebral infarction without residual deficits: Secondary | ICD-10-CM | POA: Diagnosis not present

## 2015-10-10 DIAGNOSIS — Z79899 Other long term (current) drug therapy: Secondary | ICD-10-CM | POA: Insufficient documentation

## 2015-10-10 DIAGNOSIS — M25512 Pain in left shoulder: Secondary | ICD-10-CM | POA: Diagnosis not present

## 2015-10-10 LAB — CBC AND DIFFERENTIAL: WBC: 11.6 10*3/mL

## 2015-10-10 LAB — URINALYSIS COMPLETE WITH MICROSCOPIC (ARMC ONLY)
BILIRUBIN URINE: NEGATIVE
Bacteria, UA: NONE SEEN
GLUCOSE, UA: NEGATIVE mg/dL
HGB URINE DIPSTICK: NEGATIVE
Ketones, ur: NEGATIVE mg/dL
LEUKOCYTES UA: NEGATIVE
NITRITE: NEGATIVE
Protein, ur: NEGATIVE mg/dL
RBC / HPF: NONE SEEN RBC/hpf (ref 0–5)
SPECIFIC GRAVITY, URINE: 1.006 (ref 1.005–1.030)
pH: 5 (ref 5.0–8.0)

## 2015-10-10 LAB — CK: CK TOTAL: 508 U/L — AB (ref 49–397)

## 2015-10-10 LAB — COMPREHENSIVE METABOLIC PANEL
ALBUMIN: 4.2 g/dL (ref 3.5–5.0)
ALT: 38 U/L (ref 17–63)
ANION GAP: 7 (ref 5–15)
AST: 31 U/L (ref 15–41)
Alkaline Phosphatase: 45 U/L (ref 38–126)
BILIRUBIN TOTAL: 0.8 mg/dL (ref 0.3–1.2)
BUN: 12 mg/dL (ref 6–20)
CO2: 26 mmol/L (ref 22–32)
Calcium: 9.3 mg/dL (ref 8.9–10.3)
Chloride: 106 mmol/L (ref 101–111)
Creatinine, Ser: 1.09 mg/dL (ref 0.61–1.24)
Glucose, Bld: 105 mg/dL — ABNORMAL HIGH (ref 65–99)
POTASSIUM: 4.2 mmol/L (ref 3.5–5.1)
Sodium: 139 mmol/L (ref 135–145)
TOTAL PROTEIN: 8 g/dL (ref 6.5–8.1)

## 2015-10-10 LAB — CBC WITH DIFFERENTIAL/PLATELET
BASOS PCT: 1 %
Basophils Absolute: 0.1 10*3/uL (ref 0–0.1)
Eosinophils Absolute: 0.1 10*3/uL (ref 0–0.7)
Eosinophils Relative: 1 %
HEMATOCRIT: 45.7 % (ref 40.0–52.0)
Hemoglobin: 15.4 g/dL (ref 13.0–18.0)
Lymphocytes Relative: 19 %
Lymphs Abs: 2.2 10*3/uL (ref 1.0–3.6)
MCH: 32.1 pg (ref 26.0–34.0)
MCHC: 33.7 g/dL (ref 32.0–36.0)
MCV: 95.3 fL (ref 80.0–100.0)
MONO ABS: 0.9 10*3/uL (ref 0.2–1.0)
MONOS PCT: 8 %
NEUTROS ABS: 8.4 10*3/uL — AB (ref 1.4–6.5)
Neutrophils Relative %: 71 %
Platelets: 207 10*3/uL (ref 150–440)
RBC: 4.79 MIL/uL (ref 4.40–5.90)
RDW: 14 % (ref 11.5–14.5)
WBC: 11.6 10*3/uL — ABNORMAL HIGH (ref 3.8–10.6)

## 2015-10-10 LAB — TSH: TSH: 1.246 u[IU]/mL (ref 0.350–4.500)

## 2015-10-10 LAB — BASIC METABOLIC PANEL
BUN: 12 mg/dL (ref 4–21)
CREATININE: 1.1 mg/dL (ref 0.6–1.3)
GLUCOSE: 105 mg/dL
SODIUM: 139 mmol/L (ref 137–147)

## 2015-10-10 LAB — TROPONIN I: Troponin I: <0.03 ng/mL (ref <0.031)

## 2015-10-10 LAB — MRSA PCR SCREENING: MRSA BY PCR: NEGATIVE

## 2015-10-10 LAB — LIPASE, BLOOD: LIPASE: 29 U/L (ref 11–51)

## 2015-10-10 LAB — HEPATIC FUNCTION PANEL: Bilirubin, Total: 0.8 mg/dL

## 2015-10-10 MED ORDER — METOPROLOL TARTRATE 25 MG PO TABS
25.0000 mg | ORAL_TABLET | Freq: Two times a day (BID) | ORAL | Status: DC
  Administered 2015-10-10 – 2015-10-12 (×4): 25 mg via ORAL
  Filled 2015-10-10 (×4): qty 1

## 2015-10-10 MED ORDER — OXYCODONE-ACETAMINOPHEN 5-325 MG PO TABS
1.0000 | ORAL_TABLET | Freq: Once | ORAL | Status: AC
Start: 2015-10-10 — End: 2015-10-10
  Administered 2015-10-10: 1 via ORAL
  Filled 2015-10-10: qty 1

## 2015-10-10 MED ORDER — MAGNESIUM HYDROXIDE 400 MG/5ML PO SUSP: 30.0000 mL | Freq: Every day | ORAL | Status: DC | PRN

## 2015-10-10 MED ORDER — GUAIFENESIN-DM 100-10 MG/5ML PO SYRP: 10.0000 mL | ORAL_SOLUTION | Freq: Three times a day (TID) | ORAL | Status: DC | PRN

## 2015-10-10 MED ORDER — AMIODARONE HCL 200 MG PO TABS
200.0000 mg | ORAL_TABLET | Freq: Every day | ORAL | Status: DC
  Administered 2015-10-10 – 2015-10-11 (×2): 200 mg via ORAL
  Filled 2015-10-10 (×2): qty 1

## 2015-10-10 MED ORDER — LISINOPRIL 20 MG PO TABS
20.0000 mg | ORAL_TABLET | Freq: Every day | ORAL | Status: DC
  Administered 2015-10-10 – 2015-10-12 (×3): 20 mg via ORAL
  Filled 2015-10-10 (×3): qty 1

## 2015-10-10 MED ORDER — CLOPIDOGREL BISULFATE 75 MG PO TABS
75.0000 mg | ORAL_TABLET | Freq: Every day | ORAL | Status: DC
  Administered 2015-10-10 – 2015-10-11 (×2): 75 mg via ORAL
  Filled 2015-10-10 (×2): qty 1

## 2015-10-10 MED ORDER — ONDANSETRON HCL 4 MG/2ML IJ SOLN
4.0000 mg | Freq: Four times a day (QID) | INTRAMUSCULAR | Status: DC | PRN
Start: 2015-10-10 — End: 2015-10-12
  Administered 2015-10-10 – 2015-10-11 (×2): 4 mg via INTRAVENOUS
  Filled 2015-10-10 (×2): qty 2

## 2015-10-10 MED ORDER — FINASTERIDE 5 MG PO TABS
5.0000 mg | ORAL_TABLET | Freq: Every day | ORAL | Status: DC
  Administered 2015-10-10 – 2015-10-12 (×3): 5 mg via ORAL
  Filled 2015-10-10 (×3): qty 1

## 2015-10-10 MED ORDER — DILTIAZEM HCL ER COATED BEADS 240 MG PO CP24
240.0000 mg | ORAL_CAPSULE | Freq: Every day | ORAL | Status: DC
  Administered 2015-10-10 – 2015-10-11 (×2): 240 mg via ORAL
  Filled 2015-10-10 (×5): qty 1

## 2015-10-10 MED ORDER — VITAMIN D 1000 UNITS PO TABS
1000.0000 [IU] | ORAL_TABLET | Freq: Every day | ORAL | Status: DC
  Administered 2015-10-10 – 2015-10-12 (×3): 1000 [IU] via ORAL
  Filled 2015-10-10 (×3): qty 1

## 2015-10-10 MED ORDER — ONDANSETRON HCL 4 MG PO TABS
4.0000 mg | ORAL_TABLET | Freq: Four times a day (QID) | ORAL | Status: DC | PRN
  Administered 2015-10-11: 4 mg via ORAL
  Filled 2015-10-10: qty 1

## 2015-10-10 MED ORDER — GABAPENTIN 400 MG PO CAPS
1200.0000 mg | ORAL_CAPSULE | Freq: Once | ORAL | Status: AC
  Administered 2015-10-10: 1200 mg via ORAL
  Filled 2015-10-10: qty 3

## 2015-10-10 MED ORDER — ACETAMINOPHEN 325 MG PO TABS
650.0000 mg | ORAL_TABLET | Freq: Four times a day (QID) | ORAL | Status: DC | PRN
  Administered 2015-10-10 – 2015-10-11 (×4): 650 mg via ORAL
  Filled 2015-10-10 (×4): qty 2

## 2015-10-10 MED ORDER — HYDRALAZINE HCL 20 MG/ML IJ SOLN
10.0000 mg | INTRAMUSCULAR | Status: DC | PRN
  Administered 2015-10-10 – 2015-10-12 (×2): 10 mg via INTRAVENOUS
  Filled 2015-10-10 (×3): qty 1

## 2015-10-10 MED ORDER — OXYCODONE HCL 5 MG PO TABS
5.0000 mg | ORAL_TABLET | ORAL | Status: DC | PRN
  Administered 2015-10-10 – 2015-10-12 (×8): 5 mg via ORAL
  Filled 2015-10-10 (×8): qty 1

## 2015-10-10 MED ORDER — MORPHINE SULFATE (PF) 2 MG/ML IV SOLN: 2.0000 mg | INTRAVENOUS | Status: DC | PRN

## 2015-10-10 MED ORDER — SENNA 8.6 MG PO TABS
2.0000 | ORAL_TABLET | Freq: Two times a day (BID) | ORAL | Status: DC
Start: 2015-10-10 — End: 2015-10-12
  Administered 2015-10-10 – 2015-10-12 (×4): 17.2 mg via ORAL
  Filled 2015-10-10 (×6): qty 2

## 2015-10-10 MED ORDER — TAMSULOSIN HCL 0.4 MG PO CAPS
0.4000 mg | ORAL_CAPSULE | Freq: Every day | ORAL | Status: DC
  Administered 2015-10-10 – 2015-10-12 (×3): 0.4 mg via ORAL
  Filled 2015-10-10 (×3): qty 1

## 2015-10-10 MED ORDER — GABAPENTIN 600 MG PO TABS
1200.0000 mg | ORAL_TABLET | Freq: Three times a day (TID) | ORAL | Status: DC
  Administered 2015-10-10 – 2015-10-12 (×5): 1200 mg via ORAL
  Filled 2015-10-10 (×6): qty 2

## 2015-10-10 MED ORDER — CALCIUM CARBONATE-VITAMIN D 500-200 MG-UNIT PO TABS
1.0000 | ORAL_TABLET | Freq: Every day | ORAL | Status: DC
  Administered 2015-10-10 – 2015-10-12 (×3): 1 via ORAL
  Filled 2015-10-10 (×5): qty 1

## 2015-10-10 MED ORDER — OXYCODONE-ACETAMINOPHEN 5-325 MG PO TABS
1.0000 | ORAL_TABLET | Freq: Once | ORAL | Status: AC
  Administered 2015-10-10: 1 via ORAL
  Filled 2015-10-10: qty 1

## 2015-10-10 MED ORDER — HEPARIN SODIUM (PORCINE) 5000 UNIT/ML IJ SOLN
5000.0000 [IU] | Freq: Three times a day (TID) | INTRAMUSCULAR | Status: DC
  Administered 2015-10-10 – 2015-10-12 (×5): 5000 [IU] via SUBCUTANEOUS
  Filled 2015-10-10 (×7): qty 1

## 2015-10-10 MED ORDER — NITROGLYCERIN 0.4 MG SL SUBL: 0.4000 mg | SUBLINGUAL_TABLET | SUBLINGUAL | Status: DC | PRN

## 2015-10-10 MED ORDER — SODIUM CHLORIDE 0.9% FLUSH
3.0000 mL | Freq: Two times a day (BID) | INTRAVENOUS | Status: DC
  Administered 2015-10-10 – 2015-10-12 (×5): 3 mL via INTRAVENOUS

## 2015-10-10 MED ORDER — FLUTICASONE PROPIONATE 50 MCG/ACT NA SUSP
1.0000 | Freq: Every day | NASAL | Status: DC
  Administered 2015-10-10 – 2015-10-12 (×3): 1 via NASAL
  Filled 2015-10-10: qty 16

## 2015-10-10 MED ORDER — ATORVASTATIN CALCIUM 20 MG PO TABS
80.0000 mg | ORAL_TABLET | Freq: Every day | ORAL | Status: DC
  Administered 2015-10-10: 80 mg via ORAL
  Filled 2015-10-10: qty 4

## 2015-10-10 MED ORDER — ACETAMINOPHEN 650 MG RE SUPP: 650.0000 mg | Freq: Four times a day (QID) | RECTAL | Status: DC | PRN

## 2015-10-10 NOTE — ED Notes (Signed)
Pt to MRI

## 2015-10-10 NOTE — ED Notes (Signed)
Spoke with son, Spenser Moczygemba.

## 2015-10-10 NOTE — ED Notes (Signed)
Pt here from [facility] assisted living, c/o neck pain, left arm pain and left leg pain. Pt reports pain started 45 min PTA. Pt weak on left side, woke up with weakness this morning.

## 2015-10-10 NOTE — ED Notes (Addendum)
Pt back from CT

## 2015-10-10 NOTE — H&P (Signed)
[facility] NAME: Joel Wilson    MR#:  [id number]  DATE OF BIRTH:  [birth year]   DATE OF ADMISSION:  10/10/2015  PRIMARY CARE PHYSICIAN: [place]   REQUESTING/REFERRING PHYSICIAN: [place]  CHIEF COMPLAINT:   Leg weakness  HISTORY OF PRESENT ILLNESS:  Joel Wilson  is a 75 y.o. male with a known history of coronary artery disease, essential hypertension, known cervical stenosis presenting with leg weakness. Patient states he has progressively worsening bilateral leg weakness with associated neuropathy. He also complains of neck pain left shoulder pain which are chronic. States that when he is attempting to use the bathroom at the assisted living he was so weak he could not pull his pants back on, or ambulate. Given the symptoms of weakness sent present to Hospital further workup and evaluation. Emergency department course CT as well as MRI performed multiple areas of spinal stenosis noted case discussed with neurosurgery no acute findings/no acute intervention required.  PAST MEDICAL HISTORY:   Past Medical History  Diagnosis Date  . Coronary artery disease     a. 2003 PCI to Ramus;  b. 11/2012 Cath: LM nl, LAD 30, LCX nl, RI 40 ISR, RCA nl.  . Hypertensive heart disease     a. 08/2013 Echo: EF 70-75%, mild LVH.  Marland Kitchen Hyperlipidemia   . Paroxysmal atrial fibrillation (HCC)     a.08/2013-->amio;  b. CHA2DS2VASc = 4-->no longer on coumadin.    PAST SURGICAL HISTORY:   Past Surgical History  Procedure Laterality Date  . Neck surgery    . Cardiac catheterization  11/13/12    [facility]; EF >55%  . Cardiac catheterization  2003  . Joint replacement      left hip    SOCIAL HISTORY:   Social History  Substance Use Topics  . Smoking status: Never Smoker   . Smokeless tobacco: Not on file  . Alcohol Use: No    FAMILY HISTORY:   Family History  Problem Relation Age of Onset  . Diabetes Neg Hx      DRUG ALLERGIES:   Allergies  Allergen Reactions  . Aspirin     REVIEW OF SYSTEMS:  REVIEW OF SYSTEMS:  CONSTITUTIONAL: Denies fevers, chills, fatigue, weakness.  EYES: Denies blurred vision, double vision, or eye pain.  EARS, NOSE, THROAT: Denies tinnitus, ear pain, hearing loss.  RESPIRATORY: denies cough, shortness of breath, wheezing  CARDIOVASCULAR: Denies chest pain, palpitations, edema.  GASTROINTESTINAL: Denies nausea, vomiting, diarrhea, abdominal pain.  GENITOURINARY: Denies dysuria, hematuria.  ENDOCRINE: Denies nocturia or thyroid problems. HEMATOLOGIC AND LYMPHATIC: Denies easy bruising or bleeding.  SKIN: Denies rash or lesions.  MUSCULOSKELETAL: Denies pain in neck, back, shoulder, knees, hips, or further arthritic symptoms. Generalized weakness most prominent bilateral legs NEUROLOGIC: Denies paralysis, paresthesias.  PSYCHIATRIC: Denies anxiety or depressive symptoms. Otherwise full review of systems performed by me is negative.   MEDICATIONS AT HOME:   Prior to Admission medications   Medication Sig Start Date End Date Taking? Authorizing Provider  acetaminophen (TYLENOL) 325 MG tablet Take 650 mg by mouth every 6 (six) hours as needed for mild pain or moderate pain.    Yes Historical Provider, MD  amiodarone (PACERONE) 200 MG tablet Take 1 tablet (200 mg total) by mouth at bedtime. 09/30/15  Yes Rogelia Mire, NP  atorvastatin (LIPITOR) 80 MG tablet Take 80 mg by mouth at bedtime.   Yes Historical Provider, MD  Calcium Carbonate-Vitamin D (  CALCIUM 600+D) 600-400 MG-UNIT tablet Take 1 tablet by mouth daily.   Yes Historical Provider, MD  chlorpheniramine-HYDROcodone (TUSSIONEX PENNKINETIC ER) 10-8 MG/5ML SUER Take 5 mLs by mouth every 12 (twelve) hours as needed for cough.   Yes Historical Provider, MD  cholecalciferol (VITAMIN D) 1000 UNITS tablet Take 1,000 Units by mouth daily.   Yes Historical Provider, MD  clopidogrel (PLAVIX) 75 MG tablet Take 75  mg by mouth daily.   Yes Historical Provider, MD  diltiazem (DILACOR XR) 240 MG 24 hr capsule Take 240 mg by mouth at bedtime.   Yes Historical Provider, MD  finasteride (PROSCAR) 5 MG tablet Take 5 mg by mouth daily.   Yes Historical Provider, MD  fluticasone (FLONASE) 50 MCG/ACT nasal spray Place 1 spray into both nostrils daily.   Yes Historical Provider, MD  gabapentin (NEURONTIN) 600 MG tablet Take 1,200 mg by mouth 3 (three) times daily.   Yes Historical Provider, MD  guaiFENesin-dextromethorphan (ROBITUSSIN DM) 100-10 MG/5ML syrup Take 10 mLs by mouth every 8 (eight) hours as needed for cough.   Yes Historical Provider, MD  lisinopril (PRINIVIL,ZESTRIL) 20 MG tablet Take 20 mg by mouth daily.   Yes Historical Provider, MD  magnesium hydroxide (MILK OF MAGNESIA) 400 MG/5ML suspension Take 30 mLs by mouth daily as needed for mild constipation or moderate constipation.   Yes Historical Provider, MD  metoprolol tartrate (LOPRESSOR) 25 MG tablet Take 25 mg by mouth 2 (two) times daily.   Yes Historical Provider, MD  nitroGLYCERIN (NITROSTAT) 0.4 MG SL tablet Place 0.4 mg under the tongue every 5 (five) minutes as needed for chest pain.   Yes Historical Provider, MD  ondansetron (ZOFRAN) 8 MG tablet Take 8 mg by mouth 3 (three) times daily as needed for nausea.   Yes Historical Provider, MD  oxyCODONE (OXY IR/ROXICODONE) 5 MG immediate release tablet Take 5 mg by mouth every 6 (six) hours as needed for moderate pain or severe pain.   Yes Historical Provider, MD  senna (SENOKOT) 8.6 MG tablet Take 2 tablets by mouth 2 (two) times daily.   Yes Historical Provider, MD  tamsulosin (FLOMAX) 0.4 MG CAPS Take 0.4 mg by mouth daily.   Yes Historical Provider, MD      VITAL SIGNS:  Blood pressure 178/73, pulse 66, temperature 98 F (36.7 C), temperature source Oral, resp. rate 16, weight 216 lb (97.977 kg), SpO2 97 %.  PHYSICAL EXAMINATION:  VITAL SIGNS: Filed Vitals:   10/10/15 1245 10/10/15 1300   BP:  178/73  Pulse: 65 66  Temp:    Resp: 14 16   GENERAL:74 y.o.male currently in no acute distress.  HEAD: Normocephalic, atraumatic.  EYES: Pupils equal, round, reactive to light. Extraocular muscles intact. No scleral icterus.  MOUTH: Moist mucosal membrane. Dentition poor. No abscess noted.  EAR, NOSE, THROAT: Clear without exudates. No external lesions.  NECK: Supple. No thyromegaly. No nodules. No JVD.  PULMONARY: Clear to ascultation, without wheeze rails or rhonci. No use of accessory muscles, Good respiratory effort. good air entry bilaterally CHEST: Nontender to palpation.  CARDIOVASCULAR: S1 and S2. Regular rate and rhythm. No murmurs, rubs, or gallops. No edema. Pedal pulses 2+ bilaterally.  GASTROINTESTINAL: Soft, nontender, nondistended. No masses. Positive bowel sounds. No hepatosplenomegaly.  MUSCULOSKELETAL: No swelling, clubbing, or edema. Range of motion limited in lower extremities secondary to weakness.  NEUROLOGIC: Cranial nerves II through XII are intact. Pronator drift within normal limits strength 4/5 upper extremities bilaterally including proximal distal flexion and  extension lower extremities 3/5 proximal 4/5 distal flexion and extension.  Sensation intact. Reflexes intact.  SKIN: No ulceration, lesions, rashes, or cyanosis. Skin warm and dry. Turgor intact.  PSYCHIATRIC: Mood, affect within normal limits. The patient is awake, alert and oriented x 3. Insight, judgment intact.    LABORATORY PANEL:   CBC  Recent Labs Lab 10/10/15 0849  WBC 11.6*  HGB 15.4  HCT 45.7  PLT 207   ------------------------------------------------------------------------------------------------------------------  Chemistries   Recent Labs Lab 10/10/15 0849  NA 139  K 4.2  CL 106  CO2 26  GLUCOSE 105*  BUN 12  CREATININE 1.09  CALCIUM 9.3  AST 31  ALT 38  ALKPHOS 45  BILITOT 0.8    ------------------------------------------------------------------------------------------------------------------  Cardiac Enzymes  Recent Labs Lab 10/10/15 0849  TROPONINI <0.03   ------------------------------------------------------------------------------------------------------------------  RADIOLOGY:  Dg Chest 2 View  10/10/2015  CLINICAL DATA:  Sudden onset of generalized weakness last night; history of coronary artery disease, hypertension, hyperlipidemia. EXAM: CHEST  2 VIEW COMPARISON:  PA and lateral chest x-ray of February 29, 2014 FINDINGS: The lungs are adequately inflated. There is no focal infiltrate. There are minimally increased interstitial markings in the left lower hemi thorax which are not new. The heart and pulmonary vascularity are normal. The mediastinum is normal in width. There is no pleural effusion or pneumothorax. There is mild tortuosity of the descending thoracic aorta. There are mild degenerative changes of both shoulders. The thoracic vertebral bodies are preserved in height. IMPRESSION: There is no active cardiopulmonary disease. Stable scarring in the left lower lobe. Electronically Signed   By: Jia Mohamed  [place] M.D.   On: 10/10/2015 09:23   Ct Head Wo Contrast  10/10/2015  CLINICAL DATA:  Neck pain and headache, left arm pain and left leg pain. Weakness. EXAM: CT HEAD WITHOUT CONTRAST TECHNIQUE: Contiguous axial images were obtained from the base of the skull through the vertex without intravenous contrast. COMPARISON:  Head CT dated 07/19/2015. FINDINGS: Again noted is mild generalized brain atrophy with commensurate dilatation of the ventricles and sulci. Tiny old lacunar infarct again noted within the left basal ganglia. Minimal chronic small vessel ischemic change again appreciated within the deep periventricular white matter. There is no mass, hemorrhage, edema or other evidence of acute parenchymal abnormality. No extra-axial hemorrhage. Osseous structures  are unremarkable. Visualized upper paranasal sinuses are clear. Superficial soft tissues are unremarkable. IMPRESSION: No acute findings.  No intracranial mass, hemorrhage or edema. Electronically Signed   By: Franki Cabot M.D.   On: 10/10/2015 11:38   Mr Cervical Spine Wo Contrast  10/10/2015  CLINICAL DATA:  Neck pain. Left arm pain. Left leg pain. Unable to walk. Symptoms began today. EXAM: MRI CERVICAL SPINE WITHOUT CONTRAST TECHNIQUE: Multiplanar, multisequence MR imaging of the cervical spine was performed. No intravenous contrast was administered. COMPARISON:  CT of the new cervical spine 07/19/2015. FINDINGS: Increased T2 signal is present within the cervical spine from C3-4 through C5-6. Normal cord signal is evident above and below these levels. Remote anterior fusion is present at C4-5 and C5-6. There is straightening of the normal cervical lordosis. There is diffuse fatty infiltration of the marrow. Craniocervical junction is within normal limits. The visualized intracranial contents are normal. C2-3: Asymmetric right-sided uncovertebral spurring and mild right foraminal narrowing is present. C3-4: A moderate right paramedian disc protrusion is present. This distorts the cord and narrows the canal to 7 mm. Moderate foraminal stenosis is worse on the right. C4-5: Solid osseous fusion is present.  T2 signal in the cord is evident within the gray matter. Moderate residual uncovertebral spurring is present bilaterally with moderate foraminal stenosis bilaterally. C5-6: A leftward disc osteophyte complex is present. This results in severe left central and left foraminal stenosis. Moderate right foraminal narrowing is present as well. C6-7: Solid anterior fusion is present. There is no significant residual or recurrent stenosis. C7-T1: Mild facet hypertrophy is present bilaterally. There is no significant stenosis. IMPRESSION: 1. Solid anterior fusion at C4-5 and C6-7 2. Moderate foraminal stenosis remains  bilaterally at C4-5 secondary to moderate residual uncovertebral disease. 3. Leftward disc osteophyte complex with severe left central and foraminal stenosis at C5-6. Moderate right foraminal narrowing is present as well. 4. Moderate to severe central and moderate foraminal stenosis bilaterally at C3-4, worse on the right. 5. Mild facet hypertrophy bilaterally at C7-T1 without significant focal protrusion or stenosis. Electronically Signed   By: San Morelle M.D.   On: 10/10/2015 14:24   Mr Lumbar Spine Wo Contrast  10/10/2015  CLINICAL DATA:  Severe left leg pain. Unable to walk since this morning. EXAM: MRI LUMBAR SPINE WITHOUT CONTRAST TECHNIQUE: Multiplanar, multisequence MR imaging of the lumbar spine was performed. No intravenous contrast was administered. COMPARISON:  Radiography 07/19/2015 FINDINGS: Anatomy is transitional. S1 is being described as a transitional vertebra. Correlation with this numbering scheme would be important should intervention be contemplated. T12-L1:  Mild bulging of the disc.  No stenosis.  Conus tip at L1. L1-2: Endplate osteophytes and mild bulging of the disc. No significant stenosis. L2-3:  Normal interspace. L3-4: Shallow protrusion of disc material and both posterior lateral directions. Mild stenosis of both lateral recesses. Some potential to affect the L4 nerve roots in this location. L4-5: Severe multifactorial stenosis because of endplate osteophytes and chronic broad-based protrusion of disc material with slight caudal down turning. Facet and ligamentous hypertrophy. Neural compression could occur on either side at this level. L5-S1: Broad-based disc herniation more prominent in the right posterior lateral direction. Facet and ligamentous hypertrophy. Stenosis of the subarticular lateral recesses right more than left. Foraminal narrowing right more than left. Neural compression is possible at this level, particularly affecting the right L5 and S1 nerve roots.  S1-2: Transitional and normal. 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 L3-4: Shallow bilateral posterior lateral disc protrusions. Narrowing of both lateral recesses that would have some potential to affect the L4 nerve roots. L4-5: Severe multifactorial spinal stenosis because of endplate osteophytes and circumferential protrusion of disc material with caudal down turning. Facet and ligamentous hypertrophy. Neural compression is likely at this level. L5-S1: Shallow broad-based disc protrusion more prominent in the right posterior lateral direction. Stenosis of both subarticular lateral recesses and foramina, right more than left. Neural compression is possible this level, particularly affecting the right L5 and S1 nerve roots. Electronically Signed   By: Nelson Chimes M.D.   On: 10/10/2015 14:22    EKG:   Orders placed or performed during the hospital encounter of 10/10/15  . ED EKG  . ED EKG  . EKG 12-Lead  . EKG 12-Lead    IMPRESSION AND PLAN:   75 year old Caucasian gentleman history of hyperlipidemia unspecified coronary artery disease without angina present weakness.  1. Hypertensive urgency: Restart home medications add as needed hydralazine: Blood pressure less than 180 2. Lower extremity weakness: Physical therapy consult will check CK levels given Mrs. proximal weakness and he is on a high-dose statin, check vitamin D as well 3. Hyperlipidemia unspecified: She is statin therapy  check CK level IV. Paroxysmal atrial fibrillation: Amiodarone, currently normal sinus rhythm 5. Venous thrombus embolism prophylactic: Heparin subcutaneous    All the records are reviewed and case discussed with ED provider. Management plans discussed with the patient, family and they are in agreement.  CODE STATUS: Full  TOTAL TIME TAKING CARE OF THIS PATIENT: 35 minutes.    Eligh Rybacki,  Karenann Cai.D on 10/10/2015 at 3:41 PM  Between 7am to 6pm - Pager - [phone]  After 6pm: House Pager: -  [phone]  [facility]  Office  [phone]  CC: Primary care physician; [facility]

## 2015-10-10 NOTE — ED Notes (Signed)
Pt to xray

## 2015-10-10 NOTE — ED Notes (Signed)
Pt to CT

## 2015-10-10 NOTE — ED Provider Notes (Addendum)
[facility] Emergency Department Provider Note  ____________________________________________  Time seen: Seen upon arrival to the emergency department  I have reviewed the triage vital signs and the nursing notes.   HISTORY  Chief Complaint Neck Pain; Arm Pain; and Leg Pain    HPI Joel Wilson. is a 75 y.o. male with a history of coronary artery disease and atrial fibrillation who is presenting today with diffuse weakness which is worsening since yesterday. He also has left-sided neck and arm pain which is chronic. Triage note does report weakness on the left side but the patient denies any focal weakness and just says that he feels weak all over. He says that he has gotten weak to the point where he is having difficulty walking. Says that the left-sided arm pain as well as leg pain is chronic and has been ongoing for years ever since he had a surgery on his neck. Denies any difficulty urinating or burning with urination. Says the pain to his left arm is worse with motion. Denies any nausea or vomiting or diarrhea. Denies any chest pain.   Past Medical History  Diagnosis Date  . Coronary artery disease     a. 2003 PCI to Ramus;  b. 11/2012 Cath: LM nl, LAD 30, LCX nl, RI 40 ISR, RCA nl.  . Hypertensive heart disease     a. 08/2013 Echo: EF 70-75%, mild LVH.  Marland Kitchen Hyperlipidemia   . Paroxysmal atrial fibrillation (HCC)     a.08/2013-->amio;  b. CHA2DS2VASc = 4-->no longer on coumadin.    Patient Active Problem List   Diagnosis Date Noted  . Hypertensive heart disease   . Paroxysmal atrial fibrillation (HCC)   . Long term (current) use of anticoagulants 09/07/2013  . Atrial fibrillation ([place]) 09/04/2013  . Viral URI 09/04/2013  . Nausea 09/04/2013  . Coronary artery disease   . Hypertension   . Hyperlipidemia     Past Surgical History  Procedure Laterality Date  . Neck surgery    . Cardiac catheterization  11/13/12    [facility]; EF >55%  . Cardiac  catheterization  2003  . Joint replacement      left hip    Current Outpatient Rx  Name  Route  Sig  Dispense  Refill  . acetaminophen (TYLENOL) 325 MG tablet   Oral   Take 650 mg by mouth every 6 (six) hours as needed for mild pain or moderate pain.          Marland Kitchen amiodarone (PACERONE) 200 MG tablet   Oral   Take 1 tablet (200 mg total) by mouth at bedtime.   90 tablet   3   . atorvastatin (LIPITOR) 80 MG tablet   Oral   Take 80 mg by mouth at bedtime.         . Calcium Carbonate-Vitamin D (CALCIUM 600+D) 600-400 MG-UNIT tablet   Oral   Take 1 tablet by mouth daily.         . chlorpheniramine-HYDROcodone (TUSSIONEX PENNKINETIC ER) 10-8 MG/5ML SUER   Oral   Take 5 mLs by mouth every 12 (twelve) hours as needed for cough.         . cholecalciferol (VITAMIN D) 1000 UNITS tablet   Oral   Take 1,000 Units by mouth daily.         . clopidogrel (PLAVIX) 75 MG tablet   Oral   Take 75 mg by mouth daily.         Marland Kitchen diltiazem (  DILACOR XR) 240 MG 24 hr capsule   Oral   Take 240 mg by mouth at bedtime.         . finasteride (PROSCAR) 5 MG tablet   Oral   Take 5 mg by mouth daily.         . fluticasone (FLONASE) 50 MCG/ACT nasal spray   Each Nare   Place 1 spray into both nostrils daily.         Marland Kitchen gabapentin (NEURONTIN) 600 MG tablet   Oral   Take 1,200 mg by mouth 3 (three) times daily.         Marland Kitchen guaiFENesin-dextromethorphan (ROBITUSSIN DM) 100-10 MG/5ML syrup   Oral   Take 10 mLs by mouth every 8 (eight) hours as needed for cough.         Marland Kitchen lisinopril (PRINIVIL,ZESTRIL) 20 MG tablet   Oral   Take 20 mg by mouth daily.         . magnesium hydroxide (MILK OF MAGNESIA) 400 MG/5ML suspension   Oral   Take 30 mLs by mouth daily as needed for mild constipation or moderate constipation.         . metoprolol tartrate (LOPRESSOR) 25 MG tablet   Oral   Take 25 mg by mouth 2 (two) times daily.         . nitroGLYCERIN (NITROSTAT) 0.4 MG SL tablet    Sublingual   Place 0.4 mg under the tongue every 5 (five) minutes as needed for chest pain.         Marland Kitchen ondansetron (ZOFRAN) 8 MG tablet   Oral   Take 8 mg by mouth 3 (three) times daily as needed for nausea.         Marland Kitchen oxyCODONE (OXY IR/ROXICODONE) 5 MG immediate release tablet   Oral   Take 5 mg by mouth every 6 (six) hours as needed for moderate pain or severe pain.         Marland Kitchen senna (SENOKOT) 8.6 MG tablet   Oral   Take 2 tablets by mouth 2 (two) times daily.         . tamsulosin (FLOMAX) 0.4 MG CAPS   Oral   Take 0.4 mg by mouth daily.           Allergies Aspirin  Family History  Problem Relation Age of Onset  . Family history unknown: Yes    Social History Social History  Substance Use Topics  . Smoking status: Never Smoker   . Smokeless tobacco: None  . Alcohol Use: No    Review of Systems Constitutional: No fever/chills Eyes: No visual changes. ENT: No sore throat. Cardiovascular: Denies chest pain. Respiratory: Denies shortness of breath. Gastrointestinal: No abdominal pain.  No nausea, no vomiting.  No diarrhea.  No constipation. Genitourinary: Negative for dysuria. Musculoskeletal: Negative for back pain. Skin: Negative for rash. Neurological: Negative for headaches, focal weakness or numbness.  10-point ROS otherwise negative.  ____________________________________________   PHYSICAL EXAM:  VITAL SIGNS: ED Triage Vitals  Enc Vitals Group     BP 10/10/15 0849 190/78 mmHg     Pulse Rate 10/10/15 0847 68     Resp 10/10/15 0847 20     Temp 10/10/15 0847 98 F (36.7 C)     Temp Source 10/10/15 0847 Oral     SpO2 10/10/15 0847 97 %     Weight 10/10/15 0847 216 lb (97.977 kg)     Height --      Head Cir --  Peak Flow --      Pain Score 10/10/15 0848 9     Pain Loc --      Pain Edu? --      Excl. in [place]? --     Constitutional: Alert and oriented. Well appearing and in no acute distress. Eyes: Conjunctivae are normal. PERRL.  EOMI. Head: Atraumatic. Nose: No congestion/rhinnorhea. Mouth/Throat: Mucous membranes are moist.  Oropharynx non-erythematous. Neck: No stridor.  Left-sided trapezius tenderness to palpation. No midline cervical spine tenderness to palpation or step-off or deformity. Cardiovascular: Normal rate, regular rhythm. Grossly normal heart sounds.  Good peripheral circulation with equal and bilateral radial as well as dorsalis pedis pulses.  Respiratory: Normal respiratory effort.  No retractions. Lungs CTAB. Gastrointestinal: Soft with mild diffuse tenderness. No distention. No abdominal bruits. No CVA tenderness. Musculoskeletal: No lower extremity tenderness nor edema.  No joint effusions. Neurologic:  Normal speech and language. 3/5 strength to the bilateral lower extremities. 5 out of 5 strength to bilateral upper extremities but with painful range of motion to the left upper extremity due to the pain in the left trapezius. Skin:  Skin is warm, dry and intact. No rash noted. Psychiatric: Mood and affect are normal. Speech and behavior are normal.  ____________________________________________   LABS (all labs ordered are listed, but only abnormal results are displayed)  Labs Reviewed  CBC WITH DIFFERENTIAL/PLATELET - Abnormal; Notable for the following:    WBC 11.6 (*)    Neutro Abs 8.4 (*)    All other components within normal limits  COMPREHENSIVE METABOLIC PANEL - Abnormal; Notable for the following:    Glucose, Bld 105 (*)    All other components within normal limits  URINALYSIS COMPLETEWITH MICROSCOPIC ([facility] ONLY) - Abnormal; Notable for the following:    Color, Urine YELLOW (*)    APPearance CLEAR (*)    Squamous Epithelial / LPF 0-5 (*)    All other components within normal limits  TSH  TROPONIN I  LIPASE, BLOOD   ____________________________________________  EKG  ED ECG REPORT I, Doran Stabler, the attending physician, personally viewed and interpreted this  ECG.   Date: 10/10/2015  EKG Time: 846  Rate: 69  Rhythm: normal sinus rhythm  Axis: Normal axis  Intervals:first-degree A-V block   ST&T Change: No ST segment elevation or depression. No abnormal T-wave inversion.  ____________________________________________  RADIOLOGY   ____________________________________________   PROCEDURES ____________________________________________   INITIAL IMPRESSION / ASSESSMENT AND PLAN / ED COURSE  Pertinent labs & imaging results that were available during my care of the patient were reviewed by me and considered in my medical decision making (see chart for details).  ----------------------------------------- 3:06 PM on 10/10/2015 -----------------------------------------  Discussed MRI findings with Earle Gell, the neurosurgeon from [facility]. Dr. Arnoldo Morale does not believe there is any acute finding to cause the patient's weakness on the MRIs. Unclear cause of the patient's weakness at this time. I asked him specifically about any recent viral illnesses such as runny nose cough or diarrhea and he denies. He says that he also has not had any recent vaccinations. He says that he even perfuse a flu shot this year because of having vomiting last year afterwards. This makes for a much lower suspicion for this to [facility].  The patient will be admitted to the hospital. He understands the need for [place] and is willing to comply. Signed out to Dr. Anselm Jungling. ____________________________________________   FINAL CLINICAL IMPRESSION(S) / ED DIAGNOSES  Final diagnoses:  Weakness   back pain.    Orbie Pyo, MD 10/10/15 1511  IMPRESSION: 1. Solid anterior fusion at C4-5 and C6-7 2. Moderate foraminal stenosis remains bilaterally at C4-5 secondary to moderate residual uncovertebral disease. 3. Leftward disc osteophyte complex with severe left central and foraminal stenosis at C5-6. Moderate right foraminal narrowing  is present as well. 4. Moderate to severe central and moderate foraminal stenosis bilaterally at C3-4, worse on the right. 5. Mild facet hypertrophy bilaterally at C7-T1 without significant focal protrusion or stenosis.  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  L3-4: Shallow bilateral posterior lateral disc protrusions. Narrowing of both lateral recesses that would have some potential to affect the L4 nerve roots.  L4-5: Severe multifactorial spinal stenosis because of endplate osteophytes and circumferential protrusion of disc material with caudal down turning. Facet and ligamentous hypertrophy. Neural compression is likely at this level.  L5-S1: Shallow broad-based disc protrusion more prominent in the right posterior lateral direction. Stenosis of both subarticular lateral recesses and foramina, right more than left. Neural compression is possible this level, particularly affecting the right L5 and S1 nerve roots.  No acute findings of the CT of the brain. No acute cardiopulmonary disease on the chest x-ray.   Reexamined the patient's abdomen and he is diffusely nontender at this time.      Orbie Pyo, MD 10/10/15 [phone]

## 2015-10-10 NOTE — ED Notes (Signed)
Pt back from MRI 

## 2015-10-11 DIAGNOSIS — I160 Hypertensive urgency: Secondary | ICD-10-CM | POA: Diagnosis not present

## 2015-10-11 DIAGNOSIS — E785 Hyperlipidemia, unspecified: Secondary | ICD-10-CM | POA: Diagnosis not present

## 2015-10-11 DIAGNOSIS — R29898 Other symptoms and signs involving the musculoskeletal system: Secondary | ICD-10-CM

## 2015-10-11 DIAGNOSIS — I480 Paroxysmal atrial fibrillation: Secondary | ICD-10-CM | POA: Diagnosis not present

## 2015-10-11 LAB — VITAMIN D 25 HYDROXY (VIT D DEFICIENCY, FRACTURES): VIT D 25 HYDROXY: 33.3 ng/mL (ref 30.0–100.0)

## 2015-10-11 MED ORDER — METHYLPREDNISOLONE SODIUM SUCC 40 MG IJ SOLR
40.0000 mg | Freq: Four times a day (QID) | INTRAMUSCULAR | Status: AC
  Administered 2015-10-11 (×3): 40 mg via INTRAVENOUS
  Filled 2015-10-11 (×3): qty 1

## 2015-10-11 NOTE — Progress Notes (Signed)
Patient rested quietly tonight. Pain managed with PRN Oxy and Tylenol. One complaint of nausea managed with PRN Zofran. A&Ox4 and VSS. Required one dose of PRN hydralazine at start of shift for BP 182/73; BP this morning much better at 120/53. Nursing staff will continue to monitor. Earleen Reaper, RN

## 2015-10-11 NOTE — NC FL2 (Signed)
[place] LEVEL OF CARE SCREENING TOOL     IDENTIFICATION  Patient Name: Joel Wilson. Birthdate: 1941-07-23 Sex: male Admission Date (Current Location): 10/10/2015  [place] and [place] Number:  Selena Lesser  ([id number]) Facility and Address:  [facility], [address], [place], [place] [address]      Provider Number: [id number]  Attending Physician Name and Address:  Loletha Grayer, MD  Relative Name and Phone Number:       Current Level of Care: Hospital Recommended Level of Care: [place] Prior Approval Number:    Date Approved/Denied:   PASRR Number:  ([id number])  Discharge Plan: SNF   Current Diagnoses: Patient Active Problem List   Diagnosis Date Noted  . Hypertensive urgency 10/10/2015  . Hypertensive heart disease   . Paroxysmal atrial fibrillation (HCC)   . Long term (current) use of anticoagulants 09/07/2013  . Atrial fibrillation ([place]) 09/04/2013  . Nausea 09/04/2013  . Coronary artery disease   . Hypertension   . Hyperlipidemia     Orientation RESPIRATION BLADDER Height & Weight     Self, Time, Situation, Place  O2 (Nasal Cannula 2 (L/min)) Incontinent Weight: 216 lb (97.977 kg) Height:  5\' 11"  (180.3 cm)  BEHAVIORAL SYMPTOMS/MOOD NEUROLOGICAL BOWEL NUTRITION STATUS   (None )  (None ) Continent Diet (Heart )  AMBULATORY STATUS COMMUNICATION OF NEEDS Skin   Limited Assist Verbally Normal                       Personal Care Assistance Level of Assistance  Bathing, Feeding, Dressing Bathing Assistance: Limited assistance Feeding assistance: Independent Dressing Assistance: Limited assistance     Functional Limitations Info  Sight, Hearing, Speech Sight Info: Impaired (Patient wears prescription glasses.) Hearing Info: Adequate Speech Info: Adequate    SPECIAL CARE FACTORS FREQUENCY                       Contractures      Additional Factors Info  Code Status,  Allergies Code Status Info:  (Full Code ) Allergies Info:  (Aspirin )           Current Medications (10/11/2015):  This is the current hospital active medication list Current Facility-Administered Medications  Medication Dose Route Frequency Provider Last Rate Last Dose  . acetaminophen (TYLENOL) tablet 650 mg  650 mg Oral Q6H PRN Lytle Butte, MD   650 mg at 10/11/15 0547   Or  . acetaminophen (TYLENOL) suppository 650 mg  650 mg Rectal Q6H PRN Lytle Butte, MD      . amiodarone (PACERONE) tablet 200 mg  200 mg Oral QHS Lytle Butte, MD   200 mg at 10/10/15 2125  . calcium-vitamin D (OSCAL WITH D) 500-200 MG-UNIT per tablet 1 tablet  1 tablet Oral Daily Lytle Butte, MD   1 tablet at 10/11/15 0831  . cholecalciferol (VITAMIN D) tablet 1,000 Units  1,000 Units Oral Daily Lytle Butte, MD   1,000 Units at 10/11/15 0831  . diltiazem (DILACOR XR) 24 hr capsule 240 mg  240 mg Oral QHS Lytle Butte, MD   240 mg at 10/10/15 2125  . finasteride (PROSCAR) tablet 5 mg  5 mg Oral Daily Lytle Butte, MD   5 mg at 10/11/15 0831  . fluticasone (FLONASE) 50 MCG/ACT nasal spray 1 spray  1 spray Each Nare Daily Lytle Butte, MD   1 spray at 10/11/15  0831  . gabapentin (NEURONTIN) tablet 1,200 mg  1,200 mg Oral TID Lytle Butte, MD   1,200 mg at 10/11/15 0830  . guaiFENesin-dextromethorphan (ROBITUSSIN DM) 100-10 MG/5ML syrup 10 mL  10 mL Oral Q8H PRN Lytle Butte, MD      . heparin injection 5,000 Units  5,000 Units Subcutaneous 3 times per day Lytle Butte, MD   5,000 Units at 10/11/15 0547  . hydrALAZINE (APRESOLINE) injection 10 mg  10 mg Intravenous Q4H PRN Lytle Butte, MD   10 mg at 10/10/15 2013  . lisinopril (PRINIVIL,ZESTRIL) tablet 20 mg  20 mg Oral Daily Lytle Butte, MD   20 mg at 10/11/15 0831  . magnesium hydroxide (MILK OF MAGNESIA) suspension 30 mL  30 mL Oral Daily PRN Lytle Butte, MD      . methylPREDNISolone sodium succinate (SOLU-MEDROL) 40 mg/mL injection 40 mg  40 mg  Intravenous Q6H Loletha Grayer, MD   40 mg at 10/11/15 0830  . metoprolol tartrate (LOPRESSOR) tablet 25 mg  25 mg Oral BID Lytle Butte, MD   25 mg at 10/11/15 0830  . morphine 2 MG/ML injection 2 mg  2 mg Intravenous Q4H PRN Lytle Butte, MD      . nitroGLYCERIN (NITROSTAT) SL tablet 0.4 mg  0.4 mg Sublingual Q5 min PRN Lytle Butte, MD      . ondansetron [facility]) tablet 4 mg  4 mg Oral Q6H PRN Lytle Butte, MD       Or  . ondansetron [facility]) injection 4 mg  4 mg Intravenous Q6H PRN Lytle Butte, MD   4 mg at 10/11/15 0547  . oxyCODONE (Oxy IR/ROXICODONE) immediate release tablet 5 mg  5 mg Oral Q4H PRN Lytle Butte, MD   5 mg at 10/11/15 0547  . senna (SENOKOT) tablet 17.2 mg  2 tablet Oral BID Lytle Butte, MD   17.2 mg at 10/11/15 0830  . sodium chloride flush (NS) 0.9 % injection 3 mL  3 mL Intravenous Q12H Lytle Butte, MD   3 mL at 10/11/15 [id number]  . tamsulosin (FLOMAX) capsule 0.4 mg  0.4 mg Oral Daily Lytle Butte, MD   0.4 mg at 10/11/15 [id number]     Discharge Medications: Please see discharge summary for a list of discharge medications.  Relevant Imaging Results:  Relevant Lab Results:   Additional Information  (SSN [id number])  Lorenso Quarry Trenna Kiely, LCSW

## 2015-10-11 NOTE — Evaluation (Signed)
Physical Therapy Evaluation Patient Details Name: Joel Wilson. MRN: [id number] DOB: 1940-12-22 Today's Date: 10/11/2015   History of Present Illness  Joel Wilson  is a 75 y.o. male with a known history of coronary artery disease, essential hypertension, known cervical stenosis presenting with leg weakness. Patient states he has progressively worsening bilateral leg weakness with associated neuropathy. He also complains of neck pain left shoulder pain which are chronic. States that when he is attempting to use the bathroom at the assisted living he was so weak he could not pull his pants back on, or ambulate. Given the symptoms of weakness sent present to Hospital further workup and evaluation. Emergency department course CT as well as MRI performed multiple areas of spinal stenosis noted case discussed with neurosurgery no acute findings/no acute intervention required. Neurology consulted prior to PT arrival. Pt states that he is pending transfer to [facility]. He plans to discharge to [facility] and does not desire to return to [facility]. One reported fall in the last 12 months  Clinical Impression  Pt demonstrates weakness with bed mobility and transfers. Pt is too weak to attempt ambulation at this time. Poor postural stability in sitting with ability to accept only minimal challenge to sitting balance. Once upright in standing pt is tremulous with LE buckling and heavy UE support. MMT reveals generalized UE/LE weakness without observed focal deficits. 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 Negative Hoffman UE bilateral, DTRs deferred. Pt would benefit from SNF placement. Plan currently is to discharge to [facility] for neurosurgery consult. Pt will benefit from skilled PT services to address deficits in strength, balance, and mobility in order to return to full function at home.     Follow Up Recommendations SNF;Other (comment) (Plan is for pt to transfer to [facility] hospital for  neurosurgery)    Equipment Recommendations  None recommended by PT    Recommendations for Other Services Rehab consult     Precautions / Restrictions Precautions Precautions: Fall Restrictions Weight Bearing Restrictions: No      Mobility  Bed Mobility Overal bed mobility: Needs Assistance Bed Mobility: Sit to Supine;Supine to Sit     Supine to sit: Mod assist Sit to supine: Min assist   General bed mobility comments: Pt with decreased strength during bed mobility. HOB elevated and use of bed rails. Pt reports increase in neck pain with bed mobility  Transfers Overall transfer level: Needs assistance Equipment used: Rolling walker (2 wheeled) Transfers: Sit to/from Stand Sit to Stand: Min assist         General transfer comment: Pt with cues for hand placement. Requires multiple attempts and minA+1 to come to standing. Once upright pt is very tremulous in bilateral UE/LE with bilateral LE buckling present. Gradually improves with extended time standing. Pt performs standing marches with therapist with notable LE buckling and excessive UE use. Pt reports increase in neck pain  Ambulation/Gait             General Gait Details: Unable to perform at this time  Stairs            Wheelchair Mobility    Modified Rankin (Stroke Patients Only)       Balance Overall balance assessment: Needs assistance Sitting-balance support: No upper extremity supported Sitting balance-Leahy Scale: Fair Sitting balance - Comments: Pt initially requires UE support to remain upright in sitting. Able to progress to no UE support however only able to resist minimal challenge to seated balance in  all directions. Poor trunk control noted   Standing balance support: Bilateral upper extremity supported Standing balance-Leahy Scale: Poor Standing balance comment: Unable to attempt standing balance without UE support due to severe LE weakness                              Pertinent Vitals/Pain Pain Assessment: 0-10 Pain Score: 9  Pain Location: Posterior neck however pt reports "I'm hurting all over my body." Pain Descriptors / Indicators: Sharp Pain Intervention(s): Limited activity within patient's tolerance;Monitored during session    Home Living Family/patient expects to be discharged to:: Assisted living               Home Equipment: Walker - 4 wheels;Cane - single point;Wheelchair - manual;Shower seat      Prior Function Level of Independence: Needs assistance   Gait / Transfers Assistance Needed: Ambulates facility distances with rollator. Progressive decline in mobility over the last 12 months.  ADL's / Homemaking Assistance Needed: Required assist for bathing/dressing over the last month with progressive weakness. Prior to that was independent with ADLs        Hand Dominance   Dominant Hand: Left    Extremity/Trunk Assessment   Upper Extremity Assessment: Generalized weakness;RUE deficits/detail RUE Deficits / Details: Bilateral shoulder flexion limited to approximately 100 degrees. 4-/5 bilateral shoulder flexion, 4+/5 bilateral elbow flexion, 4-/5 bilateral elbow extension, decreased grip strength bilaterally         Lower Extremity Assessment: Generalized weakness;RLE deficits/detail RLE Deficits / Details: 4-/5 bilateral hip flexion, knee extension. 4+/5 knee flexion. 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 reported       Communication   Communication: No difficulties  Cognition Arousal/Alertness: Awake/alert Behavior During Therapy: WFL for tasks assessed/performed Overall Cognitive Status: Within Functional Limits for tasks assessed                      General Comments      Exercises        Assessment/Plan    PT Assessment Patient needs continued PT services  PT Diagnosis Difficulty walking;Generalized weakness;Acute pain   PT Problem List Decreased  activity tolerance;Decreased balance;Decreased strength;Decreased range of motion;Decreased mobility;Obesity;Pain  PT Treatment Interventions DME instruction;Gait training;Stair training;Functional mobility training;Therapeutic activities;Therapeutic exercise;Balance training;Neuromuscular re-education;Patient/family education;Wheelchair mobility training   PT Goals (Current goals can be found in the Care Plan section) Acute Rehab PT Goals Patient Stated Goal: "I need to go have surgery." Pt wants to transfer to [facility] where he can undergo neurosurgery PT Goal Formulation: With patient Time For Goal Achievement: 10/25/15 Potential to Achieve Goals: Good    Frequency Min 2X/week   Barriers to discharge        Co-evaluation               End of Session Equipment Utilized During Treatment: Gait belt;Oxygen Activity Tolerance: Patient limited by fatigue;Patient limited by pain Patient left: in bed;with call bell/phone within reach;with bed alarm set;with family/visitor present      Functional Assessment Tool Used: clinical judgement, MMT Functional Limitation: Mobility: Walking and moving around Mobility: Walking and Moving Around Current Status [id number]): At least 80 percent but less than 100 percent impaired, limited or restricted Mobility: Walking and Moving Around Goal Status [phone]): At least 40 percent but less than 60 percent impaired, limited or restricted    Time: 1120-1150 PT Time Calculation (min) (ACUTE ONLY): 30  min   Charges:   PT Evaluation $PT Eval Moderate Complexity: 1 Procedure     PT G Codes:   PT G-Codes **NOT FOR INPATIENT CLASS** Functional Assessment Tool Used: clinical judgement, MMT Functional Limitation: Mobility: Walking and moving around Mobility: Walking and Moving Around Current Status [id number]): At least 80 percent but less than 100 percent impaired, limited or restricted Mobility: Walking and Moving Around Goal Status [phone]): At least 40  percent but less than 60 percent impaired, limited or restricted   Phillips Grout PT, DPT   Majed Pellegrin 10/11/2015, 12:03 PM

## 2015-10-11 NOTE — Consult Note (Addendum)
Reason for Consult:Lower extremity weakness  Referring Physician: Leslye Peer  CC: Lower extremity weakness  HPI: Joel Wilson. is an 75 y.o. male with a history of spinal stenosis s/p surgical intervention who reports progressive weakness over the past 6 months.  Patient reports that he walks with a walker but has been unable to stop the walker from getting away from him recently.  Was at his facility in the bathroom and was unable to get his pants on after using the bathroom and was unable to ambulate.  Patient was brought in for evaluation at that time.  Patient today does not wish to even attempt ambulation and reports that he just needs surgery for pain.  Reports that he has been unable to walk since Wednesday of last week.  Reports that at his current facility he is getting no therapy and was to be changed to a facility where he could get therapy at request of the [place].   Reports no bowel or bladder incontinence.    Past Medical History  Diagnosis Date  . Coronary artery disease     a. 2003 PCI to Ramus;  b. 11/2012 Cath: LM nl, LAD 30, LCX nl, RI 40 ISR, RCA nl.  . Hypertensive heart disease     a. 08/2013 Echo: EF 70-75%, mild LVH.  Marland Kitchen Hyperlipidemia   . Paroxysmal atrial fibrillation (HCC)     a.08/2013-->amio;  b. CHA2DS2VASc = 4-->no longer on coumadin.    Past Surgical History  Procedure Laterality Date  . Neck surgery    . Cardiac catheterization  11/13/12    [facility]; EF >55%  . Cardiac catheterization  2003  . Joint replacement      left hip    Family History  Problem Relation Age of Onset  . Diabetes Neg Hx     Social History:  reports that he has never smoked. He does not have any smokeless tobacco history on file. He reports that he does not drink alcohol or use illicit drugs.  Allergies  Allergen Reactions  . Aspirin     Medications:  I have reviewed the patient's current medications. Prior to Admission:  Prescriptions prior to admission  Medication Sig  Dispense Refill Last Dose  . acetaminophen (TYLENOL) 325 MG tablet Take 650 mg by mouth every 6 (six) hours as needed for mild pain or moderate pain.    10/09/2015 at Unknown time  . amiodarone (PACERONE) 200 MG tablet Take 1 tablet (200 mg total) by mouth at bedtime. 90 tablet 3 10/09/2015 at Unknown time  . atorvastatin (LIPITOR) 80 MG tablet Take 80 mg by mouth at bedtime.   10/09/2015 at Unknown time  . Calcium Carbonate-Vitamin D (CALCIUM 600+D) 600-400 MG-UNIT tablet Take 1 tablet by mouth daily.   10/09/2015 at Unknown time  . chlorpheniramine-HYDROcodone (TUSSIONEX PENNKINETIC ER) 10-8 MG/5ML SUER Take 5 mLs by mouth every 12 (twelve) hours as needed for cough.   prn at prn  . cholecalciferol (VITAMIN D) 1000 UNITS tablet Take 1,000 Units by mouth daily.   10/09/2015 at Unknown time  . clopidogrel (PLAVIX) 75 MG tablet Take 75 mg by mouth daily.   10/09/2015 at Unknown time  . diltiazem (DILACOR XR) 240 MG 24 hr capsule Take 240 mg by mouth at bedtime.   10/09/2015 at Unknown time  . finasteride (PROSCAR) 5 MG tablet Take 5 mg by mouth daily.   10/09/2015 at Unknown time  . fluticasone (FLONASE) 50 MCG/ACT nasal spray Place 1 spray into both  nostrils daily.   10/09/2015 at Unknown time  . gabapentin (NEURONTIN) 600 MG tablet Take 1,200 mg by mouth 3 (three) times daily.   10/09/2015 at Unknown time  . guaiFENesin-dextromethorphan (ROBITUSSIN DM) 100-10 MG/5ML syrup Take 10 mLs by mouth every 8 (eight) hours as needed for cough.   prn at prn  . lisinopril (PRINIVIL,ZESTRIL) 20 MG tablet Take 20 mg by mouth daily.   10/09/2015 at Unknown time  . magnesium hydroxide (MILK OF MAGNESIA) 400 MG/5ML suspension Take 30 mLs by mouth daily as needed for mild constipation or moderate constipation.   prn at prn  . metoprolol tartrate (LOPRESSOR) 25 MG tablet Take 25 mg by mouth 2 (two) times daily.   10/09/2015 at Unknown time  . nitroGLYCERIN (NITROSTAT) 0.4 MG SL tablet Place 0.4 mg under the tongue every 5 (five) minutes as  needed for chest pain.   prn at prn  . ondansetron (ZOFRAN) 8 MG tablet Take 8 mg by mouth 3 (three) times daily as needed for nausea.   prn at prn  . oxyCODONE (OXY IR/ROXICODONE) 5 MG immediate release tablet Take 5 mg by mouth every 6 (six) hours as needed for moderate pain or severe pain.   10/09/2015 at Unknown time  . senna (SENOKOT) 8.6 MG tablet Take 2 tablets by mouth 2 (two) times daily.   10/09/2015 at Unknown time  . tamsulosin (FLOMAX) 0.4 MG CAPS Take 0.4 mg by mouth daily.   10/09/2015 at Unknown time   Scheduled: . amiodarone  200 mg Oral QHS  . calcium-vitamin D  1 tablet Oral Daily  . cholecalciferol  1,000 Units Oral Daily  . diltiazem  240 mg Oral QHS  . finasteride  5 mg Oral Daily  . fluticasone  1 spray Each Nare Daily  . gabapentin  1,200 mg Oral TID  . heparin  5,000 Units Subcutaneous 3 times per day  . lisinopril  20 mg Oral Daily  . methylPREDNISolone (SOLU-MEDROL) injection  40 mg Intravenous Q6H  . metoprolol tartrate  25 mg Oral BID  . senna  2 tablet Oral BID  . sodium chloride flush  3 mL Intravenous Q12H  . tamsulosin  0.4 mg Oral Daily    ROS: History obtained from the patient  General ROS: negative for - chills, fatigue, fever, night sweats, weight gain or weight loss Psychological ROS: negative for - behavioral disorder, hallucinations, memory difficulties, mood swings or suicidal ideation Ophthalmic ROS: negative for - blurry vision, double vision, eye pain or loss of vision ENT ROS: negative for - epistaxis, nasal discharge, oral lesions, sore throat, tinnitus or vertigo Allergy and Immunology ROS: negative for - hives or itchy/watery eyes Hematological and Lymphatic ROS: negative for - bleeding problems, bruising or swollen lymph nodes Endocrine ROS: negative for - galactorrhea, hair pattern changes, polydipsia/polyuria or temperature intolerance Respiratory ROS: negative for - cough, hemoptysis, shortness of breath or wheezing Cardiovascular ROS:  lower extremity swelling Gastrointestinal ROS: negative for - abdominal pain, diarrhea, hematemesis, nausea/vomiting or stool incontinence Genito-Urinary ROS: negative for - dysuria, hematuria, incontinence or urinary frequency/urgency Musculoskeletal ROS: back and neck pain Neurological ROS: as noted in HPI Dermatological ROS: negative for rash and skin lesion changes  Physical Examination: Blood pressure 122/52, pulse 70, temperature 98.2 F (36.8 C), temperature source Oral, resp. rate 18, height 5\' 11"  (1.803 m), weight 97.977 kg (216 lb), SpO2 95 %.  HEENT-  Normocephalic, no lesions, without obvious abnormality.  Normal external eye and conjunctiva.  Normal TM's  bilaterally.  Normal auditory canals and external ears. Normal external nose, mucus membranes and septum.  Normal pharynx. Cardiovascular- S1, S2 normal, pulses palpable throughout   Lungs- chest clear, no wheezing, rales, normal symmetric air entry Abdomen- soft, non-tender; bowel sounds normal; no masses,  no organomegaly Extremities- no edema Lymph-no adenopathy palpable Musculoskeletal-no joint tenderness, deformity or swelling Skin-warm and dry, no hyperpigmentation, vitiligo, or suspicious lesions  Neurological Examination Mental Status: Alert, oriented, thought content appropriate.  Speech fluent without evidence of aphasia.  Able to follow 3 step commands without difficulty. Cranial Nerves: II: Discs flat bilaterally; Visual fields grossly normal, pupils equal, round, reactive to light and accommodation III,IV, VI: ptosis not present, extra-ocular motions intact bilaterally V,VII: smile symmetric, facial light touch sensation normal bilaterally VIII: hearing normal bilaterally IX,X: gag reflex present XI: bilateral shoulder shrug XII: midline tongue extension Motor: 5-/5 in the RUE, 4/5 in the LUE proximally with 5-/5 hand grip bilaterally.  No drift.  4-/5 hip flexor strength bilaterally with patient able to get  his legs off the bed a few inches bilaterally.  Quad strength 5-/5.  5/5 strength distally bilaterally.   Sensory: Pinprick and light touch intact throughout, bilaterally Deep Tendon Reflexes: 2+ in the upper extremities and absent in the lower extremities.   Plantars: Right: mute   Left: mute Cerebellar: Normal finger-to-nose testing bilaterally Gait: not tested due to safety concerns   Laboratory Studies:   Basic Metabolic Panel:  Recent Labs Lab 10/10/15 0849  NA 139  K 4.2  CL 106  CO2 26  GLUCOSE 105*  BUN 12  CREATININE 1.09  CALCIUM 9.3    Liver Function Tests:  Recent Labs Lab 10/10/15 0849  AST 31  ALT 38  ALKPHOS 45  BILITOT 0.8  PROT 8.0  ALBUMIN 4.2    Recent Labs Lab 10/10/15 0849  LIPASE 29   No results for input(s): AMMONIA in the last 168 hours.  CBC:  Recent Labs Lab 10/10/15 0849  WBC 11.6*  NEUTROABS 8.4*  HGB 15.4  HCT 45.7  MCV 95.3  PLT 207    Cardiac Enzymes:  Recent Labs Lab 10/10/15 0849 10/10/15 1625  CKTOTAL  --  508*  TROPONINI <0.03  --     BNP: Invalid input(s): POCBNP  CBG: No results for input(s): GLUCAP in the last 168 hours.  Microbiology: Results for orders placed or performed during the hospital encounter of 10/10/15  MRSA PCR Screening     Status: None   Collection Time: 10/10/15  3:39 PM  Result Value Ref Range Status   MRSA by PCR NEGATIVE NEGATIVE Final    Comment:        The GeneXpert MRSA Assay (FDA approved for NASAL specimens only), is one component of a comprehensive MRSA colonization surveillance program. It is not intended to diagnose MRSA infection nor to guide or monitor treatment for MRSA infections.     Coagulation Studies: No results for input(s): LABPROT, INR in the last 72 hours.  Urinalysis:  Recent Labs Lab 10/10/15 0932  COLORURINE YELLOW*  LABSPEC 1.006  PHURINE 5.0  GLUCOSEU NEGATIVE  HGBUR NEGATIVE  BILIRUBINUR NEGATIVE  KETONESUR NEGATIVE  PROTEINUR  NEGATIVE  NITRITE NEGATIVE  LEUKOCYTESUR NEGATIVE    Lipid Panel:     Component Value Date/Time   CHOL 98 08/29/2013 0115   TRIG 163 08/29/2013 0115   HDL 28* 08/29/2013 0115   VLDL 33 08/29/2013 0115   LDLCALC 37 08/29/2013 0115    HgbA1C:  Lab  Results  Component Value Date   HGBA1C 6.0 09/17/2011    Urine Drug Screen:  No results found for: LABOPIA, COCAINSCRNUR, LABBENZ, AMPHETMU, THCU, LABBARB  Alcohol Level: No results for input(s): ETH in the last 168 hours.  Other results: EKG: sinus rhythm at 69 bpm.  Imaging: Dg Chest 2 View  10/10/2015  CLINICAL DATA:  Sudden onset of generalized weakness last night; history of coronary artery disease, hypertension, hyperlipidemia. EXAM: CHEST  2 VIEW COMPARISON:  PA and lateral chest x-ray of February 29, 2014 FINDINGS: The lungs are adequately inflated. There is no focal infiltrate. There are minimally increased interstitial markings in the left lower hemi thorax which are not new. The heart and pulmonary vascularity are normal. The mediastinum is normal in width. There is no pleural effusion or pneumothorax. There is mild tortuosity of the descending thoracic aorta. There are mild degenerative changes of both shoulders. The thoracic vertebral bodies are preserved in height. IMPRESSION: There is no active cardiopulmonary disease. Stable scarring in the left lower lobe. Electronically Signed   By: David  Martinique M.D.   On: 10/10/2015 09:23   Ct Head Wo Contrast  10/10/2015  CLINICAL DATA:  Neck pain and headache, left arm pain and left leg pain. Weakness. EXAM: CT HEAD WITHOUT CONTRAST TECHNIQUE: Contiguous axial images were obtained from the base of the skull through the vertex without intravenous contrast. COMPARISON:  Head CT dated 07/19/2015. FINDINGS: Again noted is mild generalized brain atrophy with commensurate dilatation of the ventricles and sulci. Tiny old lacunar infarct again noted within the left basal ganglia. Minimal chronic  small vessel ischemic change again appreciated within the deep periventricular white matter. There is no mass, hemorrhage, edema or other evidence of acute parenchymal abnormality. No extra-axial hemorrhage. Osseous structures are unremarkable. Visualized upper paranasal sinuses are clear. Superficial soft tissues are unremarkable. IMPRESSION: No acute findings.  No intracranial mass, hemorrhage or edema. Electronically Signed   By: Franki Cabot M.D.   On: 10/10/2015 11:38   Mr Cervical Spine Wo Contrast  10/10/2015  CLINICAL DATA:  Neck pain. Left arm pain. Left leg pain. Unable to walk. Symptoms began today. EXAM: MRI CERVICAL SPINE WITHOUT CONTRAST TECHNIQUE: Multiplanar, multisequence MR imaging of the cervical spine was performed. No intravenous contrast was administered. COMPARISON:  CT of the new cervical spine 07/19/2015. FINDINGS: Increased T2 signal is present within the cervical spine from C3-4 through C5-6. Normal cord signal is evident above and below these levels. Remote anterior fusion is present at C4-5 and C5-6. There is straightening of the normal cervical lordosis. There is diffuse fatty infiltration of the marrow. Craniocervical junction is within normal limits. The visualized intracranial contents are normal. C2-3: Asymmetric right-sided uncovertebral spurring and mild right foraminal narrowing is present. C3-4: A moderate right paramedian disc protrusion is present. This distorts the cord and narrows the canal to 7 mm. Moderate foraminal stenosis is worse on the right. C4-5: Solid osseous fusion is present. T2 signal in the cord is evident within the gray matter. Moderate residual uncovertebral spurring is present bilaterally with moderate foraminal stenosis bilaterally. C5-6: A leftward disc osteophyte complex is present. This results in severe left central and left foraminal stenosis. Moderate right foraminal narrowing is present as well. C6-7: Solid anterior fusion is present. There is no  significant residual or recurrent stenosis. C7-T1: Mild facet hypertrophy is present bilaterally. There is no significant stenosis. IMPRESSION: 1. Solid anterior fusion at C4-5 and C6-7 2. Moderate foraminal stenosis remains bilaterally at C4-5  secondary to moderate residual uncovertebral disease. 3. Leftward disc osteophyte complex with severe left central and foraminal stenosis at C5-6. Moderate right foraminal narrowing is present as well. 4. Moderate to severe central and moderate foraminal stenosis bilaterally at C3-4, worse on the right. 5. Mild facet hypertrophy bilaterally at C7-T1 without significant focal protrusion or stenosis. Electronically Signed   By: San Morelle M.D.   On: 10/10/2015 14:24   Mr Lumbar Spine Wo Contrast  10/10/2015  CLINICAL DATA:  Severe left leg pain. Unable to walk since this morning. EXAM: MRI LUMBAR SPINE WITHOUT CONTRAST TECHNIQUE: Multiplanar, multisequence MR imaging of the lumbar spine was performed. No intravenous contrast was administered. COMPARISON:  Radiography 07/19/2015 FINDINGS: Anatomy is transitional. S1 is being described as a transitional vertebra. Correlation with this numbering scheme would be important should intervention be contemplated. T12-L1:  Mild bulging of the disc.  No stenosis.  Conus tip at L1. L1-2: Endplate osteophytes and mild bulging of the disc. No significant stenosis. L2-3:  Normal interspace. L3-4: Shallow protrusion of disc material and both posterior lateral directions. Mild stenosis of both lateral recesses. Some potential to affect the L4 nerve roots in this location. L4-5: Severe multifactorial stenosis because of endplate osteophytes and chronic broad-based protrusion of disc material with slight caudal down turning. Facet and ligamentous hypertrophy. Neural compression could occur on either side at this level. L5-S1: Broad-based disc herniation more prominent in the right posterior lateral direction. Facet and ligamentous  hypertrophy. Stenosis of the subarticular lateral recesses right more than left. Foraminal narrowing right more than left. Neural compression is possible at this level, particularly affecting the right L5 and S1 nerve roots. S1-2: Transitional and normal. 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 L3-4: Shallow bilateral posterior lateral disc protrusions. Narrowing of both lateral recesses that would have some potential to affect the L4 nerve roots. L4-5: Severe multifactorial spinal stenosis because of endplate osteophytes and circumferential protrusion of disc material with caudal down turning. Facet and ligamentous hypertrophy. Neural compression is likely at this level. L5-S1: Shallow broad-based disc protrusion more prominent in the right posterior lateral direction. Stenosis of both subarticular lateral recesses and foramina, right more than left. Neural compression is possible this level, particularly affecting the right L5 and S1 nerve roots. Electronically Signed   By: Nelson Chimes M.D.   On: 10/10/2015 14:22     Assessment/Plan: 75 year old male with a history of spinal stenosis followed by the [place] who presents with progressive lower extremity weakness.  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  He was referred for therapy by the [place].  Has had MRI of the cervical and lumbar spine that have been reviewed not only by me but by neurosurgery as well.  I am in agreement with surgery that there does not appear to be any acute changes noted.  There is quite a bit of multilevel pathology-most lower than what would explain his proximal lower extremity weakness.     Recommendations: 1.  Agree with therapy 2.  Patient to return to the [place] for further follow up  3.  Recommend discontinuation of steroids  Case discussed with Dr. Burnett Sheng,  MD Neurology [phone] 10/11/2015, 12:37 PM

## 2015-10-11 NOTE — Progress Notes (Signed)
Patient ID: Joel Wilson., male   DOB: [birth year], 75 y.o.   MRN: [id number] [facility] PROGRESS NOTE  Eziel Payant. [id number] DOB: [birth year] DOA: 10/10/2015 PCP: [place]  HPI/Subjective: Patient is having weakness all extremities going on for a while and worsening over the last couple weeks. He feels like he is in a fall over to the left or to the right when he's sitting or standing or walking. He normally follows at the [place].  Objective: Filed Vitals:   10/11/15 0437 10/11/15 0829  BP: 120/53 131/59  Pulse: 64 72  Temp: 97.8 F (36.6 C) 98.2 F (36.8 C)  Resp: 20 17    Filed Weights   10/10/15 0847 10/10/15 1817  Weight: 97.977 kg (216 lb) 97.977 kg (216 lb)    ROS: Review of Systems  Constitutional: Negative for fever and chills.  Eyes: Negative for blurred vision.  Respiratory: Negative for cough and shortness of breath.   Cardiovascular: Negative for chest pain.  Gastrointestinal: Negative for nausea, vomiting, diarrhea and constipation.  Genitourinary: Negative for dysuria.  Musculoskeletal: Negative for joint pain.  Neurological: Positive for focal weakness and weakness. Negative for dizziness and headaches.   Exam: Physical Exam  Constitutional: He is oriented to person, place, and time.  HENT:  Nose: No mucosal edema.  Mouth/Throat: No oropharyngeal exudate or posterior oropharyngeal edema.  Eyes: Conjunctivae, EOM and lids are normal. Pupils are equal, round, and reactive to light.  Neck: No JVD present. Carotid bruit is not present. No edema present. No thyroid mass and no thyromegaly present.  Cardiovascular: S1 normal and S2 normal.  Exam reveals no gallop.   No murmur heard. Pulses:      Dorsalis pedis pulses are 2+ on the right side, and 2+ on the left side.  Respiratory: No respiratory distress. He has no wheezes. He has no rhonchi. He has no rales.  GI: Soft. Bowel sounds are normal. There is no tenderness.   Musculoskeletal:       Right shoulder: He exhibits no swelling.  Lymphadenopathy:    He has no cervical adenopathy.  Neurological: He is alert and oriented to person, place, and time. No cranial nerve deficit.  Patient with bilateral upper arm weakness including grip strength bilaterally 4 out of 5. Patient with bilateral lower extremity weakness at the foot 3 out of 5 and lower extremity legs 4 out of 5 bilaterally  Skin: Skin is warm. No rash noted. Nails show no clubbing.  Psychiatric: He has a normal mood and affect.      Data Reviewed: Basic Metabolic Panel:  Recent Labs Lab 10/10/15 0849  NA 139  K 4.2  CL 106  CO2 26  GLUCOSE 105*  BUN 12  CREATININE 1.09  CALCIUM 9.3   Liver Function Tests:  Recent Labs Lab 10/10/15 0849  AST 31  ALT 38  ALKPHOS 45  BILITOT 0.8  PROT 8.0  ALBUMIN 4.2    Recent Labs Lab 10/10/15 0849  LIPASE 29   CBC:  Recent Labs Lab 10/10/15 0849  WBC 11.6*  NEUTROABS 8.4*  HGB 15.4  HCT 45.7  MCV 95.3  PLT 207   Cardiac Enzymes:  Recent Labs Lab 10/10/15 0849 10/10/15 1625  CKTOTAL  --  508*  TROPONINI <0.03  --      Recent Results (from the past 240 hour(s))  MRSA PCR Screening     Status: None   Collection Time: 10/10/15  3:39 PM  Result Value Ref Range Status   MRSA by PCR NEGATIVE NEGATIVE Final    Comment:        The GeneXpert MRSA Assay (FDA approved for NASAL specimens only), is one component of a comprehensive MRSA colonization surveillance program. It is not intended to diagnose MRSA infection nor to guide or monitor treatment for MRSA infections.      Studies: Dg Chest 2 View  10/10/2015  CLINICAL DATA:  Sudden onset of generalized weakness last night; history of coronary artery disease, hypertension, hyperlipidemia. EXAM: CHEST  2 VIEW COMPARISON:  PA and lateral chest x-ray of February 29, 2014 FINDINGS: The lungs are adequately inflated. There is no focal infiltrate. There are minimally  increased interstitial markings in the left lower hemi thorax which are not new. The heart and pulmonary vascularity are normal. The mediastinum is normal in width. There is no pleural effusion or pneumothorax. There is mild tortuosity of the descending thoracic aorta. There are mild degenerative changes of both shoulders. The thoracic vertebral bodies are preserved in height. IMPRESSION: There is no active cardiopulmonary disease. Stable scarring in the left lower lobe. Electronically Signed   By: David  Martinique M.D.   On: 10/10/2015 09:23   Ct Head Wo Contrast  10/10/2015  CLINICAL DATA:  Neck pain and headache, left arm pain and left leg pain. Weakness. EXAM: CT HEAD WITHOUT CONTRAST TECHNIQUE: Contiguous axial images were obtained from the base of the skull through the vertex without intravenous contrast. COMPARISON:  Head CT dated 07/19/2015. FINDINGS: Again noted is mild generalized brain atrophy with commensurate dilatation of the ventricles and sulci. Tiny old lacunar infarct again noted within the left basal ganglia. Minimal chronic small vessel ischemic change again appreciated within the deep periventricular white matter. There is no mass, hemorrhage, edema or other evidence of acute parenchymal abnormality. No extra-axial hemorrhage. Osseous structures are unremarkable. Visualized upper paranasal sinuses are clear. Superficial soft tissues are unremarkable. IMPRESSION: No acute findings.  No intracranial mass, hemorrhage or edema. Electronically Signed   By: Franki Cabot M.D.   On: 10/10/2015 11:38   Mr Cervical Spine Wo Contrast  10/10/2015  CLINICAL DATA:  Neck pain. Left arm pain. Left leg pain. Unable to walk. Symptoms began today. EXAM: MRI CERVICAL SPINE WITHOUT CONTRAST TECHNIQUE: Multiplanar, multisequence MR imaging of the cervical spine was performed. No intravenous contrast was administered. COMPARISON:  CT of the new cervical spine 07/19/2015. FINDINGS: Increased T2 signal is present  within the cervical spine from C3-4 through C5-6. Normal cord signal is evident above and below these levels. Remote anterior fusion is present at C4-5 and C5-6. There is straightening of the normal cervical lordosis. There is diffuse fatty infiltration of the marrow. Craniocervical junction is within normal limits. The visualized intracranial contents are normal. C2-3: Asymmetric right-sided uncovertebral spurring and mild right foraminal narrowing is present. C3-4: A moderate right paramedian disc protrusion is present. This distorts the cord and narrows the canal to 7 mm. Moderate foraminal stenosis is worse on the right. C4-5: Solid osseous fusion is present. T2 signal in the cord is evident within the gray matter. Moderate residual uncovertebral spurring is present bilaterally with moderate foraminal stenosis bilaterally. C5-6: A leftward disc osteophyte complex is present. This results in severe left central and left foraminal stenosis. Moderate right foraminal narrowing is present as well. C6-7: Solid anterior fusion is present. There is no significant residual or recurrent stenosis. C7-T1: Mild facet hypertrophy is present bilaterally. There is no significant stenosis. IMPRESSION:  1. Solid anterior fusion at C4-5 and C6-7 2. Moderate foraminal stenosis remains bilaterally at C4-5 secondary to moderate residual uncovertebral disease. 3. Leftward disc osteophyte complex with severe left central and foraminal stenosis at C5-6. Moderate right foraminal narrowing is present as well. 4. Moderate to severe central and moderate foraminal stenosis bilaterally at C3-4, worse on the right. 5. Mild facet hypertrophy bilaterally at C7-T1 without significant focal protrusion or stenosis. Electronically Signed   By: San Morelle M.D.   On: 10/10/2015 14:24   Mr Lumbar Spine Wo Contrast  10/10/2015  CLINICAL DATA:  Severe left leg pain. Unable to walk since this morning. EXAM: MRI LUMBAR SPINE WITHOUT CONTRAST  TECHNIQUE: Multiplanar, multisequence MR imaging of the lumbar spine was performed. No intravenous contrast was administered. COMPARISON:  Radiography 07/19/2015 FINDINGS: Anatomy is transitional. S1 is being described as a transitional vertebra. Correlation with this numbering scheme would be important should intervention be contemplated. T12-L1:  Mild bulging of the disc.  No stenosis.  Conus tip at L1. L1-2: Endplate osteophytes and mild bulging of the disc. No significant stenosis. L2-3:  Normal interspace. L3-4: Shallow protrusion of disc material and both posterior lateral directions. Mild stenosis of both lateral recesses. Some potential to affect the L4 nerve roots in this location. L4-5: Severe multifactorial stenosis because of endplate osteophytes and chronic broad-based protrusion of disc material with slight caudal down turning. Facet and ligamentous hypertrophy. Neural compression could occur on either side at this level. L5-S1: Broad-based disc herniation more prominent in the right posterior lateral direction. Facet and ligamentous hypertrophy. Stenosis of the subarticular lateral recesses right more than left. Foraminal narrowing right more than left. Neural compression is possible at this level, particularly affecting the right L5 and S1 nerve roots. S1-2: Transitional and normal. 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 L3-4: Shallow bilateral posterior lateral disc protrusions. Narrowing of both lateral recesses that would have some potential to affect the L4 nerve roots. L4-5: Severe multifactorial spinal stenosis because of endplate osteophytes and circumferential protrusion of disc material with caudal down turning. Facet and ligamentous hypertrophy. Neural compression is likely at this level. L5-S1: Shallow broad-based disc protrusion more prominent in the right posterior lateral direction. Stenosis of both subarticular lateral recesses and foramina, right more than left. Neural compression  is possible this level, particularly affecting the right L5 and S1 nerve roots. Electronically Signed   By: Nelson Chimes M.D.   On: 10/10/2015 14:22    Scheduled Meds: . amiodarone  200 mg Oral QHS  . atorvastatin  80 mg Oral QHS  . calcium-vitamin D  1 tablet Oral Daily  . cholecalciferol  1,000 Units Oral Daily  . diltiazem  240 mg Oral QHS  . finasteride  5 mg Oral Daily  . fluticasone  1 spray Each Nare Daily  . gabapentin  1,200 mg Oral TID  . heparin  5,000 Units Subcutaneous 3 times per day  . lisinopril  20 mg Oral Daily  . methylPREDNISolone (SOLU-MEDROL) injection  40 mg Intravenous Q6H  . metoprolol tartrate  25 mg Oral BID  . senna  2 tablet Oral BID  . sodium chloride flush  3 mL Intravenous Q12H  . tamsulosin  0.4 mg Oral Daily    Assessment/Plan:  1. Bilateral lower and upper extremity weakness. MRIs of the cervical spine and lumbar spine reviewed and discussed with the patient. I will consult neurology to see if patient needs a neurosurgical evaluation. [facility]. I will empirically give steroids. Physical therapy evaluation. Continue gabapentin. Statin on hold. 2. History of CAD. Hold Plavix just in case neurosurgery needed. Hold statin. 3. Hypertensive urgency- blood pressure much better today 4. Hyperlipidemia unspecified hold statin. 5. Paroxysmal atrial fibrillation- on amiodarone, metoprolol and Cardizem 6. BPH on Flomax and finasteride  Code Status:     Code Status Orders        Start     Ordered   10/10/15 1524  Full code   Continuous     10/10/15 1524    Code Status History    Date Active Date Inactive Code Status Order ID Comments User Context   This patient has a current code status but no historical code status.     Disposition Plan: If neurosurgery needed, will have care manager look into [place]  Consultants:  Neurology  Time spent: 30 minutes  [place], [place]  Hospitalists

## 2015-10-11 NOTE — Clinical Social Work Note (Signed)
Clinical Social Work Assessment  Patient Details  Name: Joel Wilson. MRN: [id number] Date of Birth: [birth year]  Date of referral:  10/11/15               Reason for consult:  Discharge Planning                Permission sought to share information with:  Facility Sport and exercise psychologist, Family Supports Permission granted to share information::  Yes, Verbal Permission Granted  Name::        Agency::   ([facility] and [facility])  Relationship::   Joel Wilson (Brother) [phone])  Contact Information:     Housing/Transportation Living arrangements for the past 2 months:  [place] ([place] ) Source of Information:  Patient Patient Interpreter Needed:  None Criminal Activity/Legal Involvement Pertinent to Current Situation/Hospitalization:  No - Comment as needed Significant Relationships:  Siblings Joel Wilson (Brother) [phone]) Lives with:  Facility Resident ([place] ) Do you feel safe going back to the place where you live?  Yes Need for family participation in patient care:  Yes (Comment) Joel Wilson (Brother) [phone])  Care giving concerns:  Patient is from [facility].    Social Worker assessment / plan:  CSW was consulted because patient is from [facility]. CSW met with patient at bed side. Patient was alert and oriented. CSW explained role. Per patient he is a resident at [facility]. He stated he's been there for about 2-3 years. Per patient he is supposed to get back surgery at the [facility]. He stated that he would like to be transferred to [facility] to get this procedure done because he was informed [facility] "does not do neurological surgeries". Patient reports that his primary care physician is through the [facility] and that they are currently "working on getting me into [facility]". Per patient paperwork has been submitted to [facility] and he's waiting until after his surgery to go to rehab. Patient reports that he has not spoken to anyone at  [facility] about his possible transfer to [facility] because he does not want to "burn any bridges". Patient granted CSW verbal permission contact [facility], [facility] and his brother Joel Wilson. CSW informed RN Case Manager that patient is requesting to be transferred to the [place].   CSW contacted [facility]- admissions coordinator with [place]. She provided CSW [phone] and requested CSW to call Joel Wilson. CSW called Joel Wilson at [facility]. Per Joel Wilson patient uses a walker. She reports that he does not use O2. She stated that patient needs limited assistance with dressing, feeding and eating. Per Joel Wilson patient does not have an Onset at [facility]. She reports that patient is his own guardian and HPOA. She reports that patient's brother Joel Wilson) is very active in patient's care. Lastly, Joel Wilson reports that patient has been a resident at [facility] since Jan. 15, 2013. She reports that patient can return at discharge.   CSW contacted [facility]- Admissions Coordinator at [facility]. Joel Wilson reports that he has not heard of patient or his referral. He stated he will speak to his administrator (Joel Wilson) and follow up with CSW. CSW is awaiting phone call back.   FL2/ PASRR completed and faxed to [place]. CSW will continue to follow and assist.   Employment status:  Retired Forensic scientist:  Information systems manager, Medicaid In [place] PT Recommendations:  [place] / Referral to community resources:     Patient/Family's Response to care:  Patient is requesting to be transferred to [facility] for his back  surgery. He reports that he will return to [facility] and wait until [facility] has an available bed.   Patient/Family's Understanding of and Emotional Response to Diagnosis, Current Treatment, and Prognosis:  Patient understands CSW's role and is appreciative of assistance.   Emotional Assessment Appearance:  Appears stated age Attitude/Demeanor/Rapport:   (None ) Affect (typically observed):  Accepting, Calm,  Pleasant Orientation:  Oriented to Self, Oriented to Place, Oriented to  Time, Oriented to Situation Alcohol / Substance use:  Not Applicable Psych involvement (Current and /or in the community):  No (Comment)  Discharge Needs  Concerns to be addressed:  Discharge Planning Concerns Readmission within the last 30 days:  No Current discharge risk:  Chronically ill Barriers to Discharge:  Continued Medical Work up   [employer], LCSW 10/11/2015, 11:40 AM

## 2015-10-11 NOTE — Care Management (Addendum)
Patient placed in observation for weakness all extremities over the last coupe of weeks.  He presents from [facility].  Patient says that his doctor at the [facility] has filled out forms so he can go to [facility].  Says "there is nothing to do at [facility] but watch tv.  No exercise or anything.  I need to be in a nursing home."  patient says that he was offered the choice to transfer to [facility] in the ED.  He would wish to transfer.  Neuro consult is pending and if findings indicate the need to transfer for services not provided at [facility] such as neuro surgery, then would initiate transfer to [facility].  Information faxed to [facility].  Patient mentions later that he is going to [facility] snf after a planned back surgery.

## 2015-10-11 NOTE — Progress Notes (Addendum)
CSW was informed by MD Wieting that patient will be medically stable for discharge tomorrow to SNF. Informed CSW that PT recommended SNF for patient. CSW contacted Janice at [facility]. Per Janice patient cannot return to [facility] because he is not able to walk. CSW informed MD of above.   CSW met with patient at bedside. Informed patient that he cannot return to [facility] because he could not walk and needed more intense rehab. Patient reported that he did not want to return to [facility]. Gained verbal permission to send SNF referrals to [place] and surrounding county to obtain a Medicaid SNF bed. Patient requested CSW to contacted his brother Gerry [phone] and daughter Angela [phone] to inform them of above.  CSW updated patient's FL2 and faxed his to SNF's in [place] and surrounding County. Received bed offers at [facility] and [facility]. Contacted [facility]- Carla to ensure that she understands that patient will enter under his Medicaid benefits. She confirmed bed offer.   CSW presented bed offers to patient. Patient accepted [facility]. Patient is aware that he has to stay at [facility] for 30 days under his Medicaid benefits. Patient requested that CSW informed his brother and daughter to discuss arrangements for his things at [facility].   CSW informed Carla that patient accepted the bed offer and to expected discharge tomorrow 10/12/15. Carla reports that she'll contact CSW with room number and report number tomorrow morning.   CSW informed patient's daughter of above. She reports that she'll speak to Janice at [facility] and arrange to get patient's personal belongings. Agreed that patient will stay at [facility] for 30 days. Left voicemail for patient's brother Gerry.  Received phone call from Janice at [facility]. Informed her that patient's family would like to speak to her about his bed hold, etc. She reported she'll contact the family  CSW will continue to  follow and assist.    , MSW, LCSW-A Clinical Social Work Department [phone]   

## 2015-10-11 NOTE — Care Management Obs Status (Signed)
[place] NOTIFICATION   Patient Details  Name: Joel Wilson. MRN: [id number] Date of Birth: Sep 23, 1940   Medicare Observation Status Notification Given:  Yes  Late entry Notice given and signed around 11:30a.    Katrina Stack, RN 10/11/2015, 3:45 PM

## 2015-10-12 DIAGNOSIS — I160 Hypertensive urgency: Secondary | ICD-10-CM | POA: Diagnosis not present

## 2015-10-12 DIAGNOSIS — R29898 Other symptoms and signs involving the musculoskeletal system: Secondary | ICD-10-CM | POA: Diagnosis not present

## 2015-10-12 DIAGNOSIS — I480 Paroxysmal atrial fibrillation: Secondary | ICD-10-CM | POA: Diagnosis not present

## 2015-10-12 DIAGNOSIS — E785 Hyperlipidemia, unspecified: Secondary | ICD-10-CM | POA: Diagnosis not present

## 2015-10-12 DIAGNOSIS — M6281 Muscle weakness (generalized): Secondary | ICD-10-CM | POA: Diagnosis not present

## 2015-10-12 MED ORDER — PREDNISONE 5 MG PO TABS: ORAL_TABLET | ORAL | Status: DC

## 2015-10-12 MED ORDER — OXYCODONE HCL 5 MG PO TABS: 5.0000 mg | ORAL_TABLET | Freq: Four times a day (QID) | ORAL | Status: DC | PRN

## 2015-10-12 NOTE — Progress Notes (Signed)
Hydralazine 10mg  iv given for BP 137/121, will recheck.

## 2015-10-12 NOTE — Clinical Social Work Placement (Signed)
   CLINICAL SOCIAL WORK PLACEMENT  NOTE  Date:  10/12/2015  Patient Details  Name: Joel Wilson. MRN: [id number] Date of Birth: 22-Feb-1941  Clinical Social Work is seeking post-discharge placement for this patient at the [place] level of care (*CSW will initial, date and re-position this form in  chart as items are completed):  Yes   Patient/family provided with [place] Work Department's list of facilities offering this level of care within the geographic area requested by the patient (or if unable, by the patient's family).  Yes   Patient/family informed of their freedom to choose among providers that offer the needed level of care, that participate in Medicare, Medicaid or managed care program needed by the patient, have an available bed and are willing to accept the patient.  Yes   Patient/family informed of [place]'s ownership interest in [facility] and [facility], as well as of the fact that they are under no obligation to receive care at these facilities.  PASRR submitted to EDS on       PASRR number received on       Existing PASRR number confirmed on 10/11/15     FL2 transmitted to all facilities in geographic area requested by pt/family on 10/11/15     FL2 transmitted to all facilities within larger geographic area on 10/11/15     Patient informed that his/her managed care company has contracts with or will negotiate with certain facilities, including the following:        Yes   Patient/family informed of bed offers received.  Patient chooses bed at  [facility] )     Physician recommends and patient chooses bed at      Patient to be transferred to  [facility]) on 10/12/15.  Patient to be transferred to facility by  [facility] )     Patient family notified on 10/12/15 of transfer.  Name of family member notified:   Sales promotion account executive (Daughter) )     PHYSICIAN       Additional Comment:     _______________________________________________ Baldemar Lenis, LCSW 10/12/2015, 1:28 PM

## 2015-10-12 NOTE — Progress Notes (Signed)
BP 147/76 after Hydralazine

## 2015-10-12 NOTE — Discharge Summary (Signed)
[facility] NAME: Joel Wilson    MR#:  [id number]  DATE OF BIRTH:  03/04/1941  DATE OF ADMISSION:  10/10/2015 ADMITTING PHYSICIAN: Lytle Butte, MD  DATE OF DISCHARGE: 10/12/2015  PRIMARY CARE PHYSICIAN: [place]    ADMISSION DIAGNOSIS:  Weakness [R53.1] Midline back pain, unspecified location [M54.89]  DISCHARGE DIAGNOSIS:  Active Problems:   Hypertensive urgency   SECONDARY DIAGNOSIS:   Past Medical History  Diagnosis Date  . Coronary artery disease     a. 2003 PCI to Ramus;  b. 11/2012 Cath: LM nl, LAD 30, LCX nl, RI 40 ISR, RCA nl.  . Hypertensive heart disease     a. 08/2013 Echo: EF 70-75%, mild LVH.  Marland Kitchen Hyperlipidemia   . Paroxysmal atrial fibrillation (HCC)     a.08/2013-->amio;  b. CHA2DS2VASc = 4-->no longer on coumadin.    HOSPITAL COURSE:   1. Bilateral weakness upper and lower extremities. Patient has numerous findings on the cervical spine MRI and lumbar spine MRI. The patient does feel better today and a little bit stronger. He was given empiric steroids and statin was stopped. I will continue to hold his statin and give a prednisone taper. Patient will be discharged to rehabilitation today. Recommend following up at the [facility] with his back surgeon or neurosurgery there. 2. Accelerated hypertension. Blood pressure was high when he came in and again this morning. The blood pressures prior to this morning's blood pressure been good. The patient will get his usual routine medications and then go from there. 3. History of coronary artery disease continue Plavix 4. Paroxysmal atrial fibrillation- on amiodarone and metoprolol and Cardizem 5. BPH on Flomax and finasteride 6. Hyperlipidemia unspecified - hold statin with his weakness.  DISCHARGE CONDITIONS:   satisfactory  CONSULTS OBTAINED:  Treatment Team:  Lytle Butte, MD Catarina Hartshorn, MD Alexis Goodell, MD  DRUG ALLERGIES:    Allergies  Allergen Reactions  . Aspirin     DISCHARGE MEDICATIONS:   Current Discharge Medication List    START taking these medications   Details  predniSONE (DELTASONE) 5 MG tablet Take 3 tabs day1; take 2 tabs day2; take one tab day3,4 then stop Qty: 7 tablet, Refills: 0      CONTINUE these medications which have CHANGED   Details  oxyCODONE (OXY IR/ROXICODONE) 5 MG immediate release tablet Take 1 tablet (5 mg total) by mouth every 6 (six) hours as needed for moderate pain or severe pain. Qty: 30 tablet, Refills: 0      CONTINUE these medications which have NOT CHANGED   Details  acetaminophen (TYLENOL) 325 MG tablet Take 650 mg by mouth every 6 (six) hours as needed for mild pain or moderate pain.     amiodarone (PACERONE) 200 MG tablet Take 1 tablet (200 mg total) by mouth at bedtime. Qty: 90 tablet, Refills: 3    Calcium Carbonate-Vitamin D (CALCIUM 600+D) 600-400 MG-UNIT tablet Take 1 tablet by mouth daily.    cholecalciferol (VITAMIN D) 1000 UNITS tablet Take 1,000 Units by mouth daily.    clopidogrel (PLAVIX) 75 MG tablet Take 75 mg by mouth daily.    diltiazem (DILACOR XR) 240 MG 24 hr capsule Take 240 mg by mouth at bedtime.    finasteride (PROSCAR) 5 MG tablet Take 5 mg by mouth daily.    fluticasone (FLONASE) 50 MCG/ACT nasal spray Place 1 spray into both nostrils daily.    gabapentin (NEURONTIN) 600  MG tablet Take 1,200 mg by mouth 3 (three) times daily.    lisinopril (PRINIVIL,ZESTRIL) 20 MG tablet Take 20 mg by mouth daily.    magnesium hydroxide (MILK OF MAGNESIA) 400 MG/5ML suspension Take 30 mLs by mouth daily as needed for mild constipation or moderate constipation.    metoprolol tartrate (LOPRESSOR) 25 MG tablet Take 25 mg by mouth 2 (two) times daily.    nitroGLYCERIN (NITROSTAT) 0.4 MG SL tablet Place 0.4 mg under the tongue every 5 (five) minutes as needed for chest pain.    senna (SENOKOT) 8.6 MG tablet Take 2 tablets by mouth 2 (two)  times daily.    tamsulosin (FLOMAX) 0.4 MG CAPS Take 0.4 mg by mouth daily.      STOP taking these medications     atorvastatin (LIPITOR) 80 MG tablet      chlorpheniramine-HYDROcodone (TUSSIONEX PENNKINETIC ER) 10-8 MG/5ML SUER      guaiFENesin-dextromethorphan (ROBITUSSIN DM) 100-10 MG/5ML syrup      ondansetron (ZOFRAN) 8 MG tablet          DISCHARGE INSTRUCTIONS:   Follow up with doctor at rehabilitation 1 day Follow up at physical therapy one day [facility]- 2 weeks  If you experience worsening of your admission symptoms, develop shortness of breath, life threatening emergency, suicidal or homicidal thoughts you must seek medical attention immediately by calling 911 or calling your MD immediately  if symptoms less severe.  You Must read complete instructions/literature along with all the possible adverse reactions/side effects for all the Medicines you take and that have been prescribed to you. Take any new Medicines after you have completely understood and accept all the possible adverse reactions/side effects.   Please note  You were cared for by a hospitalist during your hospital stay. If you have any questions about your discharge medications or the care you received while you were in the hospital after you are discharged, you can call the unit and asked to speak with the hospitalist on call if the hospitalist that took care of you is not available. Once you are discharged, your primary care physician will handle any further medical issues. Please note that NO REFILLS for any discharge medications will be authorized once you are discharged, as it is imperative that you return to your primary care physician (or establish a relationship with a primary care physician if you do not have one) for your aftercare needs so that they can reassess your need for medications and monitor your lab values.    Today   CHIEF COMPLAINT:   Chief Complaint   Patient presents with  . Neck Pain  . Arm Pain  . Leg Pain    HISTORY OF PRESENT ILLNESS:  Joel Wilson  is a 75 y.o. male presented with bilateral upper and lower extremity weakness   VITAL SIGNS:  Blood pressure 137/121, pulse 66, temperature 98 F (36.7 C), temperature source Oral, resp. rate 17, height 5\' 11"  (1.803 m), weight 97.977 kg (216 lb), SpO2 96 %.  Blood pressure will be rechecked after monitoring medications. Previous blood pressures have been good.    PHYSICAL EXAMINATION:  GENERAL:  75 y.o.-year-old patient lying in the bed with no acute distress.  EYES: Pupils equal, round, reactive to light and accommodation. No scleral icterus. Extraocular muscles intact.  HEENT: Head atraumatic, normocephalic. Oropharynx and nasopharynx clear.  NECK:  Supple, no jugular venous distention. No thyroid enlargement, no tenderness.  LUNGS: Normal breath sounds bilaterally, no  wheezing, rales,rhonchi or crepitation. No use of accessory muscles of respiration.  CARDIOVASCULAR: S1, S2 normal. No murmurs, rubs, or gallops.  ABDOMEN: Soft, non-tender, non-distended. Bowel sounds present. No organomegaly or mass.  EXTREMITIES: No pedal edema, cyanosis, or clubbing.  NEUROLOGIC: Cranial nerves II through XII are intact. Muscle strength 4+/5 in all extremities. Sensation intact. Gait not checked.  PSYCHIATRIC: The patient is alert and oriented x 3.  SKIN: No obvious rash, lesion, or ulcer.   DATA REVIEW:   CBC  Recent Labs Lab 10/10/15 0849  WBC 11.6*  HGB 15.4  HCT 45.7  PLT 207    Chemistries   Recent Labs Lab 10/10/15 0849  NA 139  K 4.2  CL 106  CO2 26  GLUCOSE 105*  BUN 12  CREATININE 1.09  CALCIUM 9.3  AST 31  ALT 38  ALKPHOS 45  BILITOT 0.8    Cardiac Enzymes  Recent Labs Lab 10/10/15 0849  TROPONINI <0.03    Microbiology Results  Results for orders placed or performed during the hospital encounter of 10/10/15  MRSA PCR Screening     Status:  None   Collection Time: 10/10/15  3:39 PM  Result Value Ref Range Status   MRSA by PCR NEGATIVE NEGATIVE Final    Comment:        The GeneXpert MRSA Assay (FDA approved for NASAL specimens only), is one component of a comprehensive MRSA colonization surveillance program. It is not intended to diagnose MRSA infection nor to guide or monitor treatment for MRSA infections.     RADIOLOGY:  Dg Chest 2 View  10/10/2015  CLINICAL DATA:  Sudden onset of generalized weakness last night; history of coronary artery disease, hypertension, hyperlipidemia. EXAM: CHEST  2 VIEW COMPARISON:  PA and lateral chest x-ray of February 29, 2014 FINDINGS: The lungs are adequately inflated. There is no focal infiltrate. There are minimally increased interstitial markings in the left lower hemi thorax which are not new. The heart and pulmonary vascularity are normal. The mediastinum is normal in width. There is no pleural effusion or pneumothorax. There is mild tortuosity of the descending thoracic aorta. There are mild degenerative changes of both shoulders. The thoracic vertebral bodies are preserved in height. IMPRESSION: There is no active cardiopulmonary disease. Stable scarring in the left lower lobe. Electronically Signed   By: David  Martinique M.D.   On: 10/10/2015 09:23   Ct Head Wo Contrast  10/10/2015  CLINICAL DATA:  Neck pain and headache, left arm pain and left leg pain. Weakness. EXAM: CT HEAD WITHOUT CONTRAST TECHNIQUE: Contiguous axial images were obtained from the base of the skull through the vertex without intravenous contrast. COMPARISON:  Head CT dated 07/19/2015. FINDINGS: Again noted is mild generalized brain atrophy with commensurate dilatation of the ventricles and sulci. Tiny old lacunar infarct again noted within the left basal ganglia. Minimal chronic small vessel ischemic change again appreciated within the deep periventricular white matter. There is no mass, hemorrhage, edema or other evidence  of acute parenchymal abnormality. No extra-axial hemorrhage. Osseous structures are unremarkable. Visualized upper paranasal sinuses are clear. Superficial soft tissues are unremarkable. IMPRESSION: No acute findings.  No intracranial mass, hemorrhage or edema. Electronically Signed   By: Franki Cabot M.D.   On: 10/10/2015 11:38   Mr Cervical Spine Wo Contrast  10/10/2015  CLINICAL DATA:  Neck pain. Left arm pain. Left leg pain. Unable to walk. Symptoms began today. EXAM: MRI CERVICAL SPINE WITHOUT CONTRAST TECHNIQUE: Multiplanar, multisequence MR imaging of  the cervical spine was performed. No intravenous contrast was administered. COMPARISON:  CT of the new cervical spine 07/19/2015. FINDINGS: Increased T2 signal is present within the cervical spine from C3-4 through C5-6. Normal cord signal is evident above and below these levels. Remote anterior fusion is present at C4-5 and C5-6. There is straightening of the normal cervical lordosis. There is diffuse fatty infiltration of the marrow. Craniocervical junction is within normal limits. The visualized intracranial contents are normal. C2-3: Asymmetric right-sided uncovertebral spurring and mild right foraminal narrowing is present. C3-4: A moderate right paramedian disc protrusion is present. This distorts the cord and narrows the canal to 7 mm. Moderate foraminal stenosis is worse on the right. C4-5: Solid osseous fusion is present. T2 signal in the cord is evident within the gray matter. Moderate residual uncovertebral spurring is present bilaterally with moderate foraminal stenosis bilaterally. C5-6: A leftward disc osteophyte complex is present. This results in severe left central and left foraminal stenosis. Moderate right foraminal narrowing is present as well. C6-7: Solid anterior fusion is present. There is no significant residual or recurrent stenosis. C7-T1: Mild facet hypertrophy is present bilaterally. There is no significant stenosis. IMPRESSION: 1.  Solid anterior fusion at C4-5 and C6-7 2. Moderate foraminal stenosis remains bilaterally at C4-5 secondary to moderate residual uncovertebral disease. 3. Leftward disc osteophyte complex with severe left central and foraminal stenosis at C5-6. Moderate right foraminal narrowing is present as well. 4. Moderate to severe central and moderate foraminal stenosis bilaterally at C3-4, worse on the right. 5. Mild facet hypertrophy bilaterally at C7-T1 without significant focal protrusion or stenosis. Electronically Signed   By: San Morelle M.D.   On: 10/10/2015 14:24   Mr Lumbar Spine Wo Contrast  10/10/2015  CLINICAL DATA:  Severe left leg pain. Unable to walk since this morning. EXAM: MRI LUMBAR SPINE WITHOUT CONTRAST TECHNIQUE: Multiplanar, multisequence MR imaging of the lumbar spine was performed. No intravenous contrast was administered. COMPARISON:  Radiography 07/19/2015 FINDINGS: Anatomy is transitional. S1 is being described as a transitional vertebra. Correlation with this numbering scheme would be important should intervention be contemplated. T12-L1:  Mild bulging of the disc.  No stenosis.  Conus tip at L1. L1-2: Endplate osteophytes and mild bulging of the disc. No significant stenosis. L2-3:  Normal interspace. L3-4: Shallow protrusion of disc material and both posterior lateral directions. Mild stenosis of both lateral recesses. Some potential to affect the L4 nerve roots in this location. L4-5: Severe multifactorial stenosis because of endplate osteophytes and chronic broad-based protrusion of disc material with slight caudal down turning. Facet and ligamentous hypertrophy. Neural compression could occur on either side at this level. L5-S1: Broad-based disc herniation more prominent in the right posterior lateral direction. Facet and ligamentous hypertrophy. Stenosis of the subarticular lateral recesses right more than left. Foraminal narrowing right more than left. Neural compression is  possible at this level, particularly affecting the right L5 and S1 nerve roots. S1-2: Transitional and normal. 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 L3-4: Shallow bilateral posterior lateral disc protrusions. Narrowing of both lateral recesses that would have some potential to affect the L4 nerve roots. L4-5: Severe multifactorial spinal stenosis because of endplate osteophytes and circumferential protrusion of disc material with caudal down turning. Facet and ligamentous hypertrophy. Neural compression is likely at this level. L5-S1: Shallow broad-based disc protrusion more prominent in the right posterior lateral direction. Stenosis of both subarticular lateral recesses and foramina, right more than left. Neural compression is possible this level, particularly  affecting the right L5 and S1 nerve roots. Electronically Signed   By: Nelson Chimes M.D.   On: 10/10/2015 14:22    Management plans discussed with the patient, and he is in agreement.  CODE STATUS:     Code Status Orders        Start     Ordered   10/10/15 1524  Full code   Continuous     10/10/15 1524    Code Status History    Date Active Date Inactive Code Status Order ID Comments User Context   This patient has a current code status but no historical code status.      TOTAL TIME TAKING CARE OF THIS PATIENT: 35 minutes.    Loletha Grayer M.D on 10/12/2015 at 8:06 AM  Between 7am to 6pm - Pager - [phone]  After 6pm go to [email or web address] - password [email or web address] [place] [facility]  Office  [phone]  CC: Primary care physician; [facility]

## 2015-10-12 NOTE — Progress Notes (Signed)
EMS here for non emergent transport to [facility] and rehab

## 2015-10-12 NOTE — Progress Notes (Signed)
Clinical Social Worker was informed that patient will be medically ready to discharge to [facility]. Patient is in a agreement with plan. CSW called [facility] to confirm that patient's bed is ready. Provided patient's room number [id number] and number to call for report [phone] .Per Rosalie Gums will come to the hospital to complete admissions paperwork with patient before he's transported. All discharge information faxed to [facility]   Call to patient's daughter, left message to inform her patient would discharge to [facility].RN will call report and patient will discharge to [facility] via [facility].  Ernest Pine, MSW, [place] Social Work Department [phone]

## 2015-10-12 NOTE — Progress Notes (Signed)
Report called to Timmothy Sours, Therapist, sports at [facility]

## 2015-10-12 NOTE — Progress Notes (Signed)
EMS called for non emergent transport to [facility]

## 2015-10-12 NOTE — Progress Notes (Signed)
Attempted to call report to [facility], no one available

## 2015-10-12 NOTE — Plan of Care (Signed)
Problem: Safety: Goal: Ability to remain free from injury will improve Outcome: Progressing Fall precautions in place  Problem: Pain Managment: Goal: General experience of comfort will improve Outcome: Not Progressing Chronic pain, prn medications  Problem: Physical Regulation: Goal: Ability to maintain clinical measurements within normal limits will improve Outcome: Progressing PT working with pt

## 2015-10-13 ENCOUNTER — Encounter: Payer: Self-pay | Admitting: Internal Medicine

## 2015-10-13 ENCOUNTER — Non-Acute Institutional Stay (SKILLED_NURSING_FACILITY): Payer: Medicare Other | Admitting: Internal Medicine

## 2015-10-13 DIAGNOSIS — M48061 Spinal stenosis, lumbar region without neurogenic claudication: Secondary | ICD-10-CM

## 2015-10-13 DIAGNOSIS — G629 Polyneuropathy, unspecified: Secondary | ICD-10-CM | POA: Diagnosis not present

## 2015-10-13 DIAGNOSIS — Z9181 History of falling: Secondary | ICD-10-CM | POA: Diagnosis not present

## 2015-10-13 DIAGNOSIS — M4806 Spinal stenosis, lumbar region: Secondary | ICD-10-CM

## 2015-10-13 DIAGNOSIS — R2681 Unsteadiness on feet: Secondary | ICD-10-CM | POA: Diagnosis not present

## 2015-10-13 DIAGNOSIS — R278 Other lack of coordination: Secondary | ICD-10-CM | POA: Diagnosis not present

## 2015-10-13 DIAGNOSIS — M4802 Spinal stenosis, cervical region: Secondary | ICD-10-CM | POA: Diagnosis not present

## 2015-10-13 DIAGNOSIS — R531 Weakness: Secondary | ICD-10-CM | POA: Diagnosis not present

## 2015-10-13 DIAGNOSIS — N400 Enlarged prostate without lower urinary tract symptoms: Secondary | ICD-10-CM | POA: Diagnosis not present

## 2015-10-13 DIAGNOSIS — E785 Hyperlipidemia, unspecified: Secondary | ICD-10-CM | POA: Diagnosis not present

## 2015-10-13 DIAGNOSIS — I10 Essential (primary) hypertension: Secondary | ICD-10-CM

## 2015-10-13 DIAGNOSIS — Z629 Problem related to upbringing, unspecified: Secondary | ICD-10-CM | POA: Diagnosis not present

## 2015-10-13 DIAGNOSIS — M5416 Radiculopathy, lumbar region: Secondary | ICD-10-CM

## 2015-10-13 DIAGNOSIS — I480 Paroxysmal atrial fibrillation: Secondary | ICD-10-CM | POA: Diagnosis not present

## 2015-10-13 DIAGNOSIS — I2510 Atherosclerotic heart disease of native coronary artery without angina pectoris: Secondary | ICD-10-CM | POA: Diagnosis not present

## 2015-10-13 NOTE — Progress Notes (Signed)
Patient ID: Joel Wilson., male   DOB: 1940/09/20, 75 y.o.   MRN: [id number]    LOCATION: [name]  PCP: [facility]   Code Status: Full Code  Goals of care: Advanced Directive information Advanced Directives 10/10/2015  Does patient have an advance directive? No  Would patient like information on creating an advanced directive? No - patient declined information    Extended Emergency Contact Information Primary Emergency Contact: Levander Campion [place] of [place] Phone: [phone] Relation: Brother Secondary Emergency Contact: Hampton Abbot Address: [address]          [place], [place] [address] Johnnette Litter of [place] Phone: [phone] Relation: Sister   Allergies  Allergen Reactions  . Aspirin     Chief Complaint  Patient presents with  . New Admit To SNF    New Admissions     HPI:  Patient is a 75 y.o. male seen today for short term rehabilitation post hospital admission from 10/10/15-10/12/15 with weakness with mid to low back pain. His imaging was suggestive of stenosis at multiple level and disc protrusion in lumbar area. He was started on prednisone and pain medication. His statin was discontinued. He also had elevated BP on admission. His medication was adjsuted. He has PMH of HTN, CAD, HLD, afib, BPH among others. He is seen in his room today. His pain medication has been of some help. He complaints of some dizziness with position change. He has trouble with balance and has weakness to his legs. He had a bowel movement yesterday.   Review of Systems:  Constitutional: Negative for fever, chills, diaphoresis.  HENT: Negative for headache, congestion, nasal discharge Eyes: Negative for blurred vision, double vision and discharge.  Respiratory: Negative for cough, shortness of breath and wheezing.   Cardiovascular: Negative for chest pain, palpitations, leg swelling.  Gastrointestinal: Negative for heartburn, nausea, vomiting,  abdominal pain. Genitourinary: Negative for dysuria Musculoskeletal: Negative for fall in the facility. Skin: Negative for itching, rash.  Neurological: Negative for numbness and tingling. Psychiatric/Behavioral: Negative for depression   Past Medical History  Diagnosis Date  . Coronary artery disease     a. 2003 PCI to Ramus;  b. 11/2012 Cath: LM nl, LAD 30, LCX nl, RI 40 ISR, RCA nl.  . Hypertensive heart disease     a. 08/2013 Echo: EF 70-75%, mild LVH.  Marland Kitchen Hyperlipidemia   . Paroxysmal atrial fibrillation (HCC)     a.08/2013-->amio;  b. CHA2DS2VASc = 4-->no longer on coumadin.   Past Surgical History  Procedure Laterality Date  . Neck surgery    . Cardiac catheterization  11/13/12    [facility]; EF >55%  . Cardiac catheterization  2003  . Joint replacement      left hip   Social History:   reports that he has never smoked. He does not have any smokeless tobacco history on file. He reports that he does not drink alcohol or use illicit drugs.  Family History  Problem Relation Age of Onset  . Diabetes Neg Hx     Medications:   Medication List       This list is accurate as of: 10/13/15 11:03 AM.  Always use your most recent med list.               acetaminophen 325 MG tablet  Commonly known as:  TYLENOL  Take 650 mg by mouth every 6 (six) hours as needed for mild pain or moderate pain.  amiodarone 200 MG tablet  Commonly known as:  PACERONE  Take 1 tablet (200 mg total) by mouth at bedtime.     CALCIUM 600+D 600-400 MG-UNIT tablet  Generic drug:  Calcium Carbonate-Vitamin D  Take 1 tablet by mouth daily.     cholecalciferol 1000 units tablet  Commonly known as:  VITAMIN D  Take 1,000 Units by mouth daily.     clopidogrel 75 MG tablet  Commonly known as:  PLAVIX  Take 75 mg by mouth daily.     diltiazem 240 MG 24 hr capsule  Commonly known as:  DILACOR XR  Take 240 mg by mouth at bedtime.     finasteride 5 MG tablet  Commonly known as:  PROSCAR  Take 5  mg by mouth daily.     fluticasone 50 MCG/ACT nasal spray  Commonly known as:  FLONASE  Place 1 spray into both nostrils daily.     gabapentin 600 MG tablet  Commonly known as:  NEURONTIN  Take 1,200 mg by mouth 3 (three) times daily.     lisinopril 20 MG tablet  Commonly known as:  PRINIVIL,ZESTRIL  Take 20 mg by mouth daily.     magnesium hydroxide 400 MG/5ML suspension  Commonly known as:  MILK OF MAGNESIA  Take 30 mLs by mouth daily as needed for mild constipation or moderate constipation.     metoprolol tartrate 25 MG tablet  Commonly known as:  LOPRESSOR  Take 25 mg by mouth 2 (two) times daily.     nitroGLYCERIN 0.4 MG SL tablet  Commonly known as:  NITROSTAT  Place 0.4 mg under the tongue every 5 (five) minutes as needed for chest pain.     oxyCODONE 5 MG immediate release tablet  Commonly known as:  Oxy IR/ROXICODONE  Take 1 tablet (5 mg total) by mouth every 6 (six) hours as needed for moderate pain or severe pain.     predniSONE 5 MG tablet  Commonly known as:  DELTASONE  Take 3 tabs day1; take 2 tabs day2; take one tab day3,4 then stop     senna 8.6 MG tablet  Commonly known as:  SENOKOT  Take 2 tablets by mouth 2 (two) times daily.     tamsulosin 0.4 MG Caps capsule  Commonly known as:  FLOMAX  Take 0.4 mg by mouth daily.        Immunizations: Immunization History  Administered Date(s) Administered  . PPD Test 10/12/2015     Physical Exam: Filed Vitals:   10/13/15 1051  BP: 119/72  Pulse: 68  Temp: 97.3 F (36.3 C)  TempSrc: Oral  Resp: 20  Height: 5\' 11"  (1.803 m)  Weight: 216 lb (97.977 kg)  SpO2: 90%   Body mass index is 30.14 kg/(m^2).  General- elderly male, well built, in no acute distress Head- normocephalic, atraumatic Nose-  no maxillary or frontal sinus tenderness, no nasal discharge Throat- moist mucus membrane, poor dentition  Eyes- PERRLA, EOMI, no pallor, no icterus, no discharge, normal conjunctiva, normal  sclera Neck- no cervical lymphadenopathy Cardiovascular- normal s1,s2, no murmurs, palpable dorsalis pedis and radial pulses, no leg edema Respiratory- bilateral clear to auscultation, no wheeze, no rhonchi, no crackles, no use of accessory muscles Abdomen- bowel sounds present, soft, non tender Musculoskeletal- able to move all 4 extremities, lower extremity weakness Neurological- alert and oriented to person, place and time Skin- warm and dry Psychiatry- normal mood and affect    Labs reviewed: Basic Metabolic Panel:  Recent Labs  10/10/15 10/10/15 0849  NA 139 139  K  --  4.2  CL  --  106  CO2  --  26  GLUCOSE  --  105*  BUN 12 12  CREATININE 1.1 1.09  CALCIUM  --  9.3   Liver Function Tests:  Recent Labs  10/10/15 0849  AST 31  ALT 38  ALKPHOS 45  BILITOT 0.8  PROT 8.0  ALBUMIN 4.2    Recent Labs  10/10/15 0849  LIPASE 29   No results for input(s): AMMONIA in the last 8760 hours. CBC:  Recent Labs  10/10/15 10/10/15 0849  WBC 11.6 11.6*  NEUTROABS  --  8.4*  HGB  --  15.4  HCT  --  45.7  MCV  --  95.3  PLT  --  207   Cardiac Enzymes:  Recent Labs  10/10/15 0849 10/10/15 1625  CKTOTAL  --  508*  TROPONINI <0.03  --    BNP: Invalid input(s): POCBNP CBG: No results for input(s): GLUCAP in the last 8760 hours.  Radiological Exams: Dg Chest 2 View  10/10/2015  CLINICAL DATA:  Sudden onset of generalized weakness last night; history of coronary artery disease, hypertension, hyperlipidemia. EXAM: CHEST  2 VIEW COMPARISON:  PA and lateral chest x-ray of February 29, 2014 FINDINGS: The lungs are adequately inflated. There is no focal infiltrate. There are minimally increased interstitial markings in the left lower hemi thorax which are not new. The heart and pulmonary vascularity are normal. The mediastinum is normal in width. There is no pleural effusion or pneumothorax. There is mild tortuosity of the descending thoracic aorta. There are mild  degenerative changes of both shoulders. The thoracic vertebral bodies are preserved in height. IMPRESSION: There is no active cardiopulmonary disease. Stable scarring in the left lower lobe. Electronically Signed   By: David  Martinique M.D.   On: 10/10/2015 09:23   Ct Head Wo Contrast  10/10/2015  CLINICAL DATA:  Neck pain and headache, left arm pain and left leg pain. Weakness. EXAM: CT HEAD WITHOUT CONTRAST TECHNIQUE: Contiguous axial images were obtained from the base of the skull through the vertex without intravenous contrast. COMPARISON:  Head CT dated 07/19/2015. FINDINGS: Again noted is mild generalized brain atrophy with commensurate dilatation of the ventricles and sulci. Tiny old lacunar infarct again noted within the left basal ganglia. Minimal chronic small vessel ischemic change again appreciated within the deep periventricular white matter. There is no mass, hemorrhage, edema or other evidence of acute parenchymal abnormality. No extra-axial hemorrhage. Osseous structures are unremarkable. Visualized upper paranasal sinuses are clear. Superficial soft tissues are unremarkable. IMPRESSION: No acute findings.  No intracranial mass, hemorrhage or edema. Electronically Signed   By: Franki Cabot M.D.   On: 10/10/2015 11:38   Mr Cervical Spine Wo Contrast  10/10/2015  CLINICAL DATA:  Neck pain. Left arm pain. Left leg pain. Unable to walk. Symptoms began today. EXAM: MRI CERVICAL SPINE WITHOUT CONTRAST TECHNIQUE: Multiplanar, multisequence MR imaging of the cervical spine was performed. No intravenous contrast was administered. COMPARISON:  CT of the new cervical spine 07/19/2015. FINDINGS: Increased T2 signal is present within the cervical spine from C3-4 through C5-6. Normal cord signal is evident above and below these levels. Remote anterior fusion is present at C4-5 and C5-6. There is straightening of the normal cervical lordosis. There is diffuse fatty infiltration of the marrow. Craniocervical  junction is within normal limits. The visualized intracranial contents are normal. C2-3: Asymmetric right-sided uncovertebral spurring and  mild right foraminal narrowing is present. C3-4: A moderate right paramedian disc protrusion is present. This distorts the cord and narrows the canal to 7 mm. Moderate foraminal stenosis is worse on the right. C4-5: Solid osseous fusion is present. T2 signal in the cord is evident within the gray matter. Moderate residual uncovertebral spurring is present bilaterally with moderate foraminal stenosis bilaterally. C5-6: A leftward disc osteophyte complex is present. This results in severe left central and left foraminal stenosis. Moderate right foraminal narrowing is present as well. C6-7: Solid anterior fusion is present. There is no significant residual or recurrent stenosis. C7-T1: Mild facet hypertrophy is present bilaterally. There is no significant stenosis. IMPRESSION: 1. Solid anterior fusion at C4-5 and C6-7 2. Moderate foraminal stenosis remains bilaterally at C4-5 secondary to moderate residual uncovertebral disease. 3. Leftward disc osteophyte complex with severe left central and foraminal stenosis at C5-6. Moderate right foraminal narrowing is present as well. 4. Moderate to severe central and moderate foraminal stenosis bilaterally at C3-4, worse on the right. 5. Mild facet hypertrophy bilaterally at C7-T1 without significant focal protrusion or stenosis. Electronically Signed   By: San Morelle M.D.   On: 10/10/2015 14:24   Mr Lumbar Spine Wo Contrast  10/10/2015  CLINICAL DATA:  Severe left leg pain. Unable to walk since this morning. EXAM: MRI LUMBAR SPINE WITHOUT CONTRAST TECHNIQUE: Multiplanar, multisequence MR imaging of the lumbar spine was performed. No intravenous contrast was administered. COMPARISON:  Radiography 07/19/2015 FINDINGS: Anatomy is transitional. S1 is being described as a transitional vertebra. Correlation with this numbering scheme  would be important should intervention be contemplated. T12-L1:  Mild bulging of the disc.  No stenosis.  Conus tip at L1. L1-2: Endplate osteophytes and mild bulging of the disc. No significant stenosis. L2-3:  Normal interspace. L3-4: Shallow protrusion of disc material and both posterior lateral directions. Mild stenosis of both lateral recesses. Some potential to affect the L4 nerve roots in this location. L4-5: Severe multifactorial stenosis because of endplate osteophytes and chronic broad-based protrusion of disc material with slight caudal down turning. Facet and ligamentous hypertrophy. Neural compression could occur on either side at this level. L5-S1: Broad-based disc herniation more prominent in the right posterior lateral direction. Facet and ligamentous hypertrophy. Stenosis of the subarticular lateral recesses right more than left. Foraminal narrowing right more than left. Neural compression is possible at this level, particularly affecting the right L5 and S1 nerve roots. S1-2: Transitional and normal. 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 L3-4: Shallow bilateral posterior lateral disc protrusions. Narrowing of both lateral recesses that would have some potential to affect the L4 nerve roots. L4-5: Severe multifactorial spinal stenosis because of endplate osteophytes and circumferential protrusion of disc material with caudal down turning. Facet and ligamentous hypertrophy. Neural compression is likely at this level. L5-S1: Shallow broad-based disc protrusion more prominent in the right posterior lateral direction. Stenosis of both subarticular lateral recesses and foramina, right more than left. Neural compression is possible this level, particularly affecting the right L5 and S1 nerve roots. Electronically Signed   By: Nelson Chimes M.D.   On: 10/10/2015 14:22    Assessment/Plan  Generalized weakness Will have him work with physical therapy and occupational therapy team to help with gait  training and muscle strengthening exercises.fall precautions. Skin care. Encourage to be out of bed.   Spinal stenosis with radiculopathy Will have patient work with PT/OT as tolerated to regain strength and restore function.  Fall precautions are in place. Continue and  complete tapering course of prednisone. Continue oxyIR 5 mg 1 tab q6h prn pain. Get neurosurgery consult. Continue gabapentin  HTN Stable bp, monitor bp reading, check bmp. Continue lisinopril and lopressor  afib Rate controlled. Continue amiodarone 200 mg daily and diltiazem  CAD Chest pain free. Continue plavix, lisinopril, lopressor and prn NTG  BPH Stable, continue finasteride and tamsulosin  HLD Currently off statin with leg weakness. Monitor clinically   Goals of care: short term rehabilitation   Labs/tests ordered:  Family/ staff Communication: reviewed care plan with patient and nursing supervisor    Blanchie Serve, MD Internal Medicine [place], [place] [address] Cell Phone (Monday-Friday 8 am - 5 pm): [phone] On Call: [phone] and follow prompts after 5 pm and on weekends Office Phone: [phone] Office Fax: [phone]

## 2015-10-14 DIAGNOSIS — I10 Essential (primary) hypertension: Secondary | ICD-10-CM | POA: Diagnosis not present

## 2015-10-14 DIAGNOSIS — Z629 Problem related to upbringing, unspecified: Secondary | ICD-10-CM | POA: Diagnosis not present

## 2015-10-14 DIAGNOSIS — G629 Polyneuropathy, unspecified: Secondary | ICD-10-CM | POA: Diagnosis not present

## 2015-10-14 DIAGNOSIS — R2681 Unsteadiness on feet: Secondary | ICD-10-CM | POA: Diagnosis not present

## 2015-10-14 DIAGNOSIS — M4802 Spinal stenosis, cervical region: Secondary | ICD-10-CM | POA: Diagnosis not present

## 2015-10-14 DIAGNOSIS — Z9181 History of falling: Secondary | ICD-10-CM | POA: Diagnosis not present

## 2015-10-17 DIAGNOSIS — Z79899 Other long term (current) drug therapy: Secondary | ICD-10-CM | POA: Diagnosis not present

## 2015-10-17 DIAGNOSIS — M4802 Spinal stenosis, cervical region: Secondary | ICD-10-CM | POA: Diagnosis not present

## 2015-10-17 DIAGNOSIS — R2681 Unsteadiness on feet: Secondary | ICD-10-CM | POA: Diagnosis not present

## 2015-10-17 DIAGNOSIS — G629 Polyneuropathy, unspecified: Secondary | ICD-10-CM | POA: Diagnosis not present

## 2015-10-17 DIAGNOSIS — I10 Essential (primary) hypertension: Secondary | ICD-10-CM | POA: Diagnosis not present

## 2015-10-17 DIAGNOSIS — Z9181 History of falling: Secondary | ICD-10-CM | POA: Diagnosis not present

## 2015-10-17 DIAGNOSIS — Z629 Problem related to upbringing, unspecified: Secondary | ICD-10-CM | POA: Diagnosis not present

## 2015-10-17 LAB — CBC AND DIFFERENTIAL
HEMATOCRIT: 44 % (ref 41–53)
HEMOGLOBIN: 15.1 g/dL (ref 13.5–17.5)
PLATELETS: 231 10*3/uL (ref 150–399)
WBC: 10 10*3/mL

## 2015-10-17 LAB — HEPATIC FUNCTION PANEL
ALT: 41 U/L — AB (ref 10–40)
AST: 26 U/L (ref 14–40)
Alkaline Phosphatase: 37 U/L (ref 25–125)
BILIRUBIN, TOTAL: 1.1 mg/dL

## 2015-10-17 LAB — BASIC METABOLIC PANEL
BUN: 20 mg/dL (ref 4–21)
Creatinine: 1 mg/dL (ref 0.6–1.3)
GLUCOSE: 82 mg/dL
Potassium: 4.6 mmol/L (ref 3.4–5.3)
Sodium: 136 mmol/L — AB (ref 137–147)

## 2015-10-18 DIAGNOSIS — Z9181 History of falling: Secondary | ICD-10-CM | POA: Diagnosis not present

## 2015-10-18 DIAGNOSIS — I10 Essential (primary) hypertension: Secondary | ICD-10-CM | POA: Diagnosis not present

## 2015-10-18 DIAGNOSIS — M4802 Spinal stenosis, cervical region: Secondary | ICD-10-CM | POA: Diagnosis not present

## 2015-10-18 DIAGNOSIS — G629 Polyneuropathy, unspecified: Secondary | ICD-10-CM | POA: Diagnosis not present

## 2015-10-18 DIAGNOSIS — R2681 Unsteadiness on feet: Secondary | ICD-10-CM | POA: Diagnosis not present

## 2015-10-18 DIAGNOSIS — Z629 Problem related to upbringing, unspecified: Secondary | ICD-10-CM | POA: Diagnosis not present

## 2015-10-19 DIAGNOSIS — Z629 Problem related to upbringing, unspecified: Secondary | ICD-10-CM | POA: Diagnosis not present

## 2015-10-19 DIAGNOSIS — R2681 Unsteadiness on feet: Secondary | ICD-10-CM | POA: Diagnosis not present

## 2015-10-19 DIAGNOSIS — I10 Essential (primary) hypertension: Secondary | ICD-10-CM | POA: Diagnosis not present

## 2015-10-19 DIAGNOSIS — G629 Polyneuropathy, unspecified: Secondary | ICD-10-CM | POA: Diagnosis not present

## 2015-10-19 DIAGNOSIS — Z9181 History of falling: Secondary | ICD-10-CM | POA: Diagnosis not present

## 2015-10-19 DIAGNOSIS — M4802 Spinal stenosis, cervical region: Secondary | ICD-10-CM | POA: Diagnosis not present

## 2015-10-20 ENCOUNTER — Other Ambulatory Visit: Payer: Self-pay

## 2015-10-20 DIAGNOSIS — I10 Essential (primary) hypertension: Secondary | ICD-10-CM | POA: Diagnosis not present

## 2015-10-20 DIAGNOSIS — Z629 Problem related to upbringing, unspecified: Secondary | ICD-10-CM | POA: Diagnosis not present

## 2015-10-20 DIAGNOSIS — M4802 Spinal stenosis, cervical region: Secondary | ICD-10-CM | POA: Diagnosis not present

## 2015-10-20 DIAGNOSIS — G629 Polyneuropathy, unspecified: Secondary | ICD-10-CM | POA: Diagnosis not present

## 2015-10-20 DIAGNOSIS — R2681 Unsteadiness on feet: Secondary | ICD-10-CM | POA: Diagnosis not present

## 2015-10-20 DIAGNOSIS — Z9181 History of falling: Secondary | ICD-10-CM | POA: Diagnosis not present

## 2015-10-20 MED ORDER — OXYCODONE HCL 5 MG PO TABS: 5.0000 mg | ORAL_TABLET | Freq: Four times a day (QID) | ORAL | Status: DC | PRN

## 2015-10-20 NOTE — Telephone Encounter (Signed)
Rx for [facility] fax number [phone], phone number [phone]

## 2015-10-21 DIAGNOSIS — M4802 Spinal stenosis, cervical region: Secondary | ICD-10-CM | POA: Diagnosis not present

## 2015-10-21 DIAGNOSIS — Z629 Problem related to upbringing, unspecified: Secondary | ICD-10-CM | POA: Diagnosis not present

## 2015-10-21 DIAGNOSIS — I10 Essential (primary) hypertension: Secondary | ICD-10-CM | POA: Diagnosis not present

## 2015-10-21 DIAGNOSIS — G629 Polyneuropathy, unspecified: Secondary | ICD-10-CM | POA: Diagnosis not present

## 2015-10-21 DIAGNOSIS — R2681 Unsteadiness on feet: Secondary | ICD-10-CM | POA: Diagnosis not present

## 2015-10-21 DIAGNOSIS — Z9181 History of falling: Secondary | ICD-10-CM | POA: Diagnosis not present

## 2015-10-24 DIAGNOSIS — G629 Polyneuropathy, unspecified: Secondary | ICD-10-CM | POA: Diagnosis not present

## 2015-10-24 DIAGNOSIS — I10 Essential (primary) hypertension: Secondary | ICD-10-CM | POA: Diagnosis not present

## 2015-10-24 DIAGNOSIS — Z9181 History of falling: Secondary | ICD-10-CM | POA: Diagnosis not present

## 2015-10-24 DIAGNOSIS — Z629 Problem related to upbringing, unspecified: Secondary | ICD-10-CM | POA: Diagnosis not present

## 2015-10-24 DIAGNOSIS — R2681 Unsteadiness on feet: Secondary | ICD-10-CM | POA: Diagnosis not present

## 2015-10-24 DIAGNOSIS — M4802 Spinal stenosis, cervical region: Secondary | ICD-10-CM | POA: Diagnosis not present

## 2015-10-25 DIAGNOSIS — Z9181 History of falling: Secondary | ICD-10-CM | POA: Diagnosis not present

## 2015-10-25 DIAGNOSIS — M4802 Spinal stenosis, cervical region: Secondary | ICD-10-CM | POA: Diagnosis not present

## 2015-10-25 DIAGNOSIS — I10 Essential (primary) hypertension: Secondary | ICD-10-CM | POA: Diagnosis not present

## 2015-10-25 DIAGNOSIS — Z629 Problem related to upbringing, unspecified: Secondary | ICD-10-CM | POA: Diagnosis not present

## 2015-10-25 DIAGNOSIS — G629 Polyneuropathy, unspecified: Secondary | ICD-10-CM | POA: Diagnosis not present

## 2015-10-25 DIAGNOSIS — R2681 Unsteadiness on feet: Secondary | ICD-10-CM | POA: Diagnosis not present

## 2015-10-26 DIAGNOSIS — G629 Polyneuropathy, unspecified: Secondary | ICD-10-CM | POA: Diagnosis not present

## 2015-10-26 DIAGNOSIS — Z9181 History of falling: Secondary | ICD-10-CM | POA: Diagnosis not present

## 2015-10-26 DIAGNOSIS — R2681 Unsteadiness on feet: Secondary | ICD-10-CM | POA: Diagnosis not present

## 2015-10-26 DIAGNOSIS — I10 Essential (primary) hypertension: Secondary | ICD-10-CM | POA: Diagnosis not present

## 2015-10-26 DIAGNOSIS — M4802 Spinal stenosis, cervical region: Secondary | ICD-10-CM | POA: Diagnosis not present

## 2015-10-26 DIAGNOSIS — Z629 Problem related to upbringing, unspecified: Secondary | ICD-10-CM | POA: Diagnosis not present

## 2015-10-27 DIAGNOSIS — G629 Polyneuropathy, unspecified: Secondary | ICD-10-CM | POA: Diagnosis not present

## 2015-10-27 DIAGNOSIS — R2681 Unsteadiness on feet: Secondary | ICD-10-CM | POA: Diagnosis not present

## 2015-10-27 DIAGNOSIS — I10 Essential (primary) hypertension: Secondary | ICD-10-CM | POA: Diagnosis not present

## 2015-10-27 DIAGNOSIS — Z629 Problem related to upbringing, unspecified: Secondary | ICD-10-CM | POA: Diagnosis not present

## 2015-10-27 DIAGNOSIS — M4802 Spinal stenosis, cervical region: Secondary | ICD-10-CM | POA: Diagnosis not present

## 2015-10-27 DIAGNOSIS — Z9181 History of falling: Secondary | ICD-10-CM | POA: Diagnosis not present

## 2015-10-28 DIAGNOSIS — M4802 Spinal stenosis, cervical region: Secondary | ICD-10-CM | POA: Diagnosis not present

## 2015-10-28 DIAGNOSIS — Z629 Problem related to upbringing, unspecified: Secondary | ICD-10-CM | POA: Diagnosis not present

## 2015-10-28 DIAGNOSIS — I10 Essential (primary) hypertension: Secondary | ICD-10-CM | POA: Diagnosis not present

## 2015-10-28 DIAGNOSIS — G629 Polyneuropathy, unspecified: Secondary | ICD-10-CM | POA: Diagnosis not present

## 2015-10-28 DIAGNOSIS — R2681 Unsteadiness on feet: Secondary | ICD-10-CM | POA: Diagnosis not present

## 2015-10-28 DIAGNOSIS — Z9181 History of falling: Secondary | ICD-10-CM | POA: Diagnosis not present

## 2015-10-31 DIAGNOSIS — R2681 Unsteadiness on feet: Secondary | ICD-10-CM | POA: Diagnosis not present

## 2015-10-31 DIAGNOSIS — I10 Essential (primary) hypertension: Secondary | ICD-10-CM | POA: Diagnosis not present

## 2015-10-31 DIAGNOSIS — Z629 Problem related to upbringing, unspecified: Secondary | ICD-10-CM | POA: Diagnosis not present

## 2015-10-31 DIAGNOSIS — M4802 Spinal stenosis, cervical region: Secondary | ICD-10-CM | POA: Diagnosis not present

## 2015-10-31 DIAGNOSIS — G629 Polyneuropathy, unspecified: Secondary | ICD-10-CM | POA: Diagnosis not present

## 2015-10-31 DIAGNOSIS — Z9181 History of falling: Secondary | ICD-10-CM | POA: Diagnosis not present

## 2015-11-01 DIAGNOSIS — Z629 Problem related to upbringing, unspecified: Secondary | ICD-10-CM | POA: Diagnosis not present

## 2015-11-01 DIAGNOSIS — M4802 Spinal stenosis, cervical region: Secondary | ICD-10-CM | POA: Diagnosis not present

## 2015-11-01 DIAGNOSIS — R2681 Unsteadiness on feet: Secondary | ICD-10-CM | POA: Diagnosis not present

## 2015-11-01 DIAGNOSIS — G629 Polyneuropathy, unspecified: Secondary | ICD-10-CM | POA: Diagnosis not present

## 2015-11-01 DIAGNOSIS — Z9181 History of falling: Secondary | ICD-10-CM | POA: Diagnosis not present

## 2015-11-01 DIAGNOSIS — I10 Essential (primary) hypertension: Secondary | ICD-10-CM | POA: Diagnosis not present

## 2015-11-08 ENCOUNTER — Non-Acute Institutional Stay (SKILLED_NURSING_FACILITY): Payer: Medicare Other | Admitting: Family

## 2015-11-08 DIAGNOSIS — E559 Vitamin D deficiency, unspecified: Secondary | ICD-10-CM

## 2015-11-08 DIAGNOSIS — I119 Hypertensive heart disease without heart failure: Secondary | ICD-10-CM | POA: Diagnosis not present

## 2015-11-08 DIAGNOSIS — I480 Paroxysmal atrial fibrillation: Secondary | ICD-10-CM

## 2015-11-08 DIAGNOSIS — G609 Hereditary and idiopathic neuropathy, unspecified: Secondary | ICD-10-CM | POA: Diagnosis not present

## 2015-11-08 DIAGNOSIS — N400 Enlarged prostate without lower urinary tract symptoms: Secondary | ICD-10-CM

## 2015-11-08 DIAGNOSIS — I2510 Atherosclerotic heart disease of native coronary artery without angina pectoris: Secondary | ICD-10-CM

## 2015-11-08 DIAGNOSIS — G629 Polyneuropathy, unspecified: Secondary | ICD-10-CM | POA: Insufficient documentation

## 2015-11-08 MED ORDER — MELATONIN 3 MG PO TABS: 1.0000 | ORAL_TABLET | Freq: Every day | ORAL | Status: DC

## 2015-11-08 NOTE — Progress Notes (Signed)
Patient ID: Joel Wilson., male   DOB: 1941-02-25, 75 y.o.   MRN: [id number]  Location:  [place] of Service:  SNF (31) Provider: Blanchie Serve, MD   [place]  Patient Care Team: [facility] as PCP - General (General Practice)  Extended Emergency Contact Information Primary Emergency Contact: Levander Campion [place] of [place] Phone: [phone] Mobile Phone: [phone] Relation: Brother Secondary Emergency Contact: [place] of [place] Phone: [phone] Mobile Phone: [phone] Relation: Son  Code Status: Full Code  Goals of care: Advanced Directive information Advanced Directives 10/10/2015  Does patient have an advance directive? No  Would patient like information on creating an advanced directive? No - patient declined information     Chief Complaint  Patient presents with  . Medical Management of Chronic Issues    HPI:  Pt is a 75 y.o. male seen today at [facility] for Medical management of chronic issues. He has a medical history of HTN, BPH, Afib , Neuropathy, CAD Vit D def amongst others. He is seen in his room today watching TV. He complains of unable to sleep at night. Request medication to him sleep at night. He denies any feelings of anxiety or depression. Also denies any fever, Chills or cough. Facility staff reports no new concerns.    Past Medical History  Diagnosis Date  . Coronary artery disease     a. 2003 PCI to Ramus;  b. 11/2012 Cath: LM nl, LAD 30, LCX nl, RI 40 ISR, RCA nl.  . Hypertensive heart disease     a. 08/2013 Echo: EF 70-75%, mild LVH.  Marland Kitchen Hyperlipidemia   . Paroxysmal atrial fibrillation (HCC)     a.08/2013-->amio;  b. CHA2DS2VASc = 4-->no longer on coumadin.   Past Surgical History  Procedure Laterality Date  . Neck surgery    . Cardiac catheterization  11/13/12    [facility]; EF >55%  . Cardiac catheterization  2003  .  Joint replacement      left hip    Allergies  Allergen Reactions  . Aspirin       Medication List       This list is accurate as of: 11/08/15  6:19 PM.  Always use your most recent med list.               acetaminophen 325 MG tablet  Commonly known as:  TYLENOL  Take 650 mg by mouth every 6 (six) hours as needed for mild pain or moderate pain.     amiodarone 200 MG tablet  Commonly known as:  PACERONE  Take 1 tablet (200 mg total) by mouth at bedtime.     CALCIUM 600+D 600-400 MG-UNIT tablet  Generic drug:  Calcium Carbonate-Vitamin D  Take 1 tablet by mouth daily.     cholecalciferol 1000 units tablet  Commonly known as:  VITAMIN D  Take 1,000 Units by mouth daily.     clopidogrel 75 MG tablet  Commonly known as:  PLAVIX  Take 75 mg by mouth daily.     diltiazem 240 MG 24 hr capsule  Commonly known as:  DILACOR XR  Take 240 mg by mouth at bedtime.     finasteride 5 MG tablet  Commonly known as:  PROSCAR  Take 5 mg by mouth daily.     fluticasone 50 MCG/ACT nasal spray  Commonly known as:  FLONASE  Place 1 spray  into both nostrils daily.     gabapentin 600 MG tablet  Commonly known as:  NEURONTIN  Take 1,200 mg by mouth 3 (three) times daily.     lisinopril 20 MG tablet  Commonly known as:  PRINIVIL,ZESTRIL  Take 20 mg by mouth daily.     magnesium hydroxide 400 MG/5ML suspension  Commonly known as:  MILK OF MAGNESIA  Take 30 mLs by mouth daily as needed for mild constipation or moderate constipation.     metoprolol tartrate 25 MG tablet  Commonly known as:  LOPRESSOR  Take 25 mg by mouth 2 (two) times daily.     nitroGLYCERIN 0.4 MG SL tablet  Commonly known as:  NITROSTAT  Place 0.4 mg under the tongue every 5 (five) minutes as needed for chest pain.     oxyCODONE 5 MG immediate release tablet  Commonly known as:  Oxy IR/ROXICODONE  Take 1 tablet (5 mg total) by mouth every 6 (six) hours as needed for moderate pain or severe pain.     senna  8.6 MG tablet  Commonly known as:  SENOKOT  Take 2 tablets by mouth 2 (two) times daily.     tamsulosin 0.4 MG Caps capsule  Commonly known as:  FLOMAX  Take 0.4 mg by mouth daily.        Review of Systems  Constitutional: Negative for fever, chills, activity change, appetite change and fatigue.  HENT: Negative.   Eyes: Negative.   Respiratory: Negative.   Cardiovascular: Negative.   Gastrointestinal: Negative.   Endocrine: Negative.   Genitourinary: Negative for dysuria, urgency, frequency, hematuria and flank pain.  Musculoskeletal: Positive for gait problem.  Skin: Negative.   Allergic/Immunologic: Negative.   Neurological: Negative for dizziness, seizures, syncope, light-headedness and headaches.       Unable to seep at night.   Hematological: Negative.   Psychiatric/Behavioral: Negative.     Immunization History  Administered Date(s) Administered  . PPD Test 10/12/2015   Pertinent  Health Maintenance Due  Topic Date Due  . COLONOSCOPY  05/26/1990  . PNA vac Low Risk Adult (1 of 2 - PCV13) 05/26/2005  . INFLUENZA VACCINE  04/04/2015   No flowsheet data found. Functional Status Survey:    Filed Vitals:   11/08/15 1807  BP: 100/55  Pulse: 69  Temp: 97.3 F (36.3 C)  Resp: 20  Weight: 206 lb 6.4 oz (93.622 kg)  SpO2: 95%   Body mass index is 28.8 kg/(m^2). Physical Exam  Constitutional: He is oriented to person, place, and time. He appears well-developed and well-nourished. No distress.  HENT:  Head: Normocephalic.  Right Ear: External ear normal.  Left Ear: External ear normal.  Mouth/Throat: Oropharynx is clear and moist.  Eyes: Conjunctivae and EOM are normal. Pupils are equal, round, and reactive to light. Right eye exhibits no discharge. No scleral icterus.  Neck: Normal range of motion. No JVD present. No thyromegaly present.  Cardiovascular: Normal rate, normal heart sounds and intact distal pulses.  Exam reveals no gallop and no friction rub.     No murmur heard. Pulmonary/Chest: Effort normal and breath sounds normal. No respiratory distress. He has no wheezes. He has no rales.  Abdominal: Soft. Bowel sounds are normal. He exhibits no distension and no mass. There is no tenderness. There is no rebound and no guarding.  Musculoskeletal: Normal range of motion. He exhibits no edema or tenderness.  Generalized weakness   Lymphadenopathy:    He has no cervical adenopathy.  Neurological:  He is oriented to person, place, and time.  Skin: Skin is warm and dry. No rash noted. No erythema. No pallor.  Psychiatric: He has a normal mood and affect.    Labs reviewed:  Recent Labs  10/10/15 10/10/15 0849  NA 139 139  K  --  4.2  CL  --  106  CO2  --  26  GLUCOSE  --  105*  BUN 12 12  CREATININE 1.1 1.09  CALCIUM  --  9.3    Recent Labs  10/10/15 0849  AST 31  ALT 38  ALKPHOS 45  BILITOT 0.8  PROT 8.0  ALBUMIN 4.2    Recent Labs  10/10/15 10/10/15 0849  WBC 11.6 11.6*  NEUTROABS  --  8.4*  HGB  --  15.4  HCT  --  45.7  MCV  --  95.3  PLT  --  207   Lab Results  Component Value Date   TSH 1.246 10/10/2015   Lab Results  Component Value Date   HGBA1C 6.0 09/17/2011   Lab Results  Component Value Date   CHOL 98 08/29/2013   HDL 28* 08/29/2013   LDLCALC 37 08/29/2013   TRIG 163 08/29/2013    Significant Diagnostic Results in last 30 days:  Dg Chest 2 View  10/10/2015  CLINICAL DATA:  Sudden onset of generalized weakness last night; history of coronary artery disease, hypertension, hyperlipidemia. EXAM: CHEST  2 VIEW COMPARISON:  PA and lateral chest x-ray of February 29, 2014 FINDINGS: The lungs are adequately inflated. There is no focal infiltrate. There are minimally increased interstitial markings in the left lower hemi thorax which are not new. The heart and pulmonary vascularity are normal. The mediastinum is normal in width. There is no pleural effusion or pneumothorax. There is mild tortuosity of the  descending thoracic aorta. There are mild degenerative changes of both shoulders. The thoracic vertebral bodies are preserved in height. IMPRESSION: There is no active cardiopulmonary disease. Stable scarring in the left lower lobe. Electronically Signed   By: David  Martinique M.D.   On: 10/10/2015 09:23   Ct Head Wo Contrast  10/10/2015  CLINICAL DATA:  Neck pain and headache, left arm pain and left leg pain. Weakness. EXAM: CT HEAD WITHOUT CONTRAST TECHNIQUE: Contiguous axial images were obtained from the base of the skull through the vertex without intravenous contrast. COMPARISON:  Head CT dated 07/19/2015. FINDINGS: Again noted is mild generalized brain atrophy with commensurate dilatation of the ventricles and sulci. Tiny old lacunar infarct again noted within the left basal ganglia. Minimal chronic small vessel ischemic change again appreciated within the deep periventricular white matter. There is no mass, hemorrhage, edema or other evidence of acute parenchymal abnormality. No extra-axial hemorrhage. Osseous structures are unremarkable. Visualized upper paranasal sinuses are clear. Superficial soft tissues are unremarkable. IMPRESSION: No acute findings.  No intracranial mass, hemorrhage or edema. Electronically Signed   By: Franki Cabot M.D.   On: 10/10/2015 11:38   Mr Cervical Spine Wo Contrast  10/10/2015  CLINICAL DATA:  Neck pain. Left arm pain. Left leg pain. Unable to walk. Symptoms began today. EXAM: MRI CERVICAL SPINE WITHOUT CONTRAST TECHNIQUE: Multiplanar, multisequence MR imaging of the cervical spine was performed. No intravenous contrast was administered. COMPARISON:  CT of the new cervical spine 07/19/2015. FINDINGS: Increased T2 signal is present within the cervical spine from C3-4 through C5-6. Normal cord signal is evident above and below these levels. Remote anterior fusion is present at C4-5 and  C5-6. There is straightening of the normal cervical lordosis. There is diffuse fatty  infiltration of the marrow. Craniocervical junction is within normal limits. The visualized intracranial contents are normal. C2-3: Asymmetric right-sided uncovertebral spurring and mild right foraminal narrowing is present. C3-4: A moderate right paramedian disc protrusion is present. This distorts the cord and narrows the canal to 7 mm. Moderate foraminal stenosis is worse on the right. C4-5: Solid osseous fusion is present. T2 signal in the cord is evident within the gray matter. Moderate residual uncovertebral spurring is present bilaterally with moderate foraminal stenosis bilaterally. C5-6: A leftward disc osteophyte complex is present. This results in severe left central and left foraminal stenosis. Moderate right foraminal narrowing is present as well. C6-7: Solid anterior fusion is present. There is no significant residual or recurrent stenosis. C7-T1: Mild facet hypertrophy is present bilaterally. There is no significant stenosis. IMPRESSION: 1. Solid anterior fusion at C4-5 and C6-7 2. Moderate foraminal stenosis remains bilaterally at C4-5 secondary to moderate residual uncovertebral disease. 3. Leftward disc osteophyte complex with severe left central and foraminal stenosis at C5-6. Moderate right foraminal narrowing is present as well. 4. Moderate to severe central and moderate foraminal stenosis bilaterally at C3-4, worse on the right. 5. Mild facet hypertrophy bilaterally at C7-T1 without significant focal protrusion or stenosis. Electronically Signed   By: San Morelle M.D.   On: 10/10/2015 14:24   Mr Lumbar Spine Wo Contrast  10/10/2015  CLINICAL DATA:  Severe left leg pain. Unable to walk since this morning. EXAM: MRI LUMBAR SPINE WITHOUT CONTRAST TECHNIQUE: Multiplanar, multisequence MR imaging of the lumbar spine was performed. No intravenous contrast was administered. COMPARISON:  Radiography 07/19/2015 FINDINGS: Anatomy is transitional. S1 is being described as a transitional  vertebra. Correlation with this numbering scheme would be important should intervention be contemplated. T12-L1:  Mild bulging of the disc.  No stenosis.  Conus tip at L1. L1-2: Endplate osteophytes and mild bulging of the disc. No significant stenosis. L2-3:  Normal interspace. L3-4: Shallow protrusion of disc material and both posterior lateral directions. Mild stenosis of both lateral recesses. Some potential to affect the L4 nerve roots in this location. L4-5: Severe multifactorial stenosis because of endplate osteophytes and chronic broad-based protrusion of disc material with slight caudal down turning. Facet and ligamentous hypertrophy. Neural compression could occur on either side at this level. L5-S1: Broad-based disc herniation more prominent in the right posterior lateral direction. Facet and ligamentous hypertrophy. Stenosis of the subarticular lateral recesses right more than left. Foraminal narrowing right more than left. Neural compression is possible at this level, particularly affecting the right L5 and S1 nerve roots. S1-2: Transitional and normal. 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 L3-4: Shallow bilateral posterior lateral disc protrusions. Narrowing of both lateral recesses that would have some potential to affect the L4 nerve roots. L4-5: Severe multifactorial spinal stenosis because of endplate osteophytes and circumferential protrusion of disc material with caudal down turning. Facet and ligamentous hypertrophy. Neural compression is likely at this level. L5-S1: Shallow broad-based disc protrusion more prominent in the right posterior lateral direction. Stenosis of both subarticular lateral recesses and foramina, right more than left. Neural compression is possible this level, particularly affecting the right L5 and S1 nerve roots. Electronically Signed   By: Nelson Chimes M.D.   On: 10/10/2015 14:22    Assessment/Plan 1. Paroxysmal atrial fibrillation (HCC) HR controlled. Continue  on Metoprolol and Amiodarone.   2. Coronary artery disease involving native coronary artery of native  heart without angina pectoris No chest pain. Continue on Plavix and control high risk facotrs.   3. Vitamin D deficiency Continue on Calcium and vit D supplement. Monitor Vit D level.   4. Hereditary and idiopathic peripheral neuropathy Worst on LE's. Continue on Gabapentin.   5. Hypertensive heart disease without heart failure B/p stable. Continue on Lisinopril, Diltiazem, Metoprolol and Amiodarone   6. BPH without obstruction/lower urinary tract symptoms Stable. Continue on Tamsulosin and finasteride. Monitor for urinary obstruction.  7. Insomnia  Ordering Melatonin 3 mg Tablet one by mouth at bedtime.    Family/ staff Communication:Reviwed plan of care with Patient and facility Nurse supervisor.   Labs/tests ordered:  None

## 2015-11-18 ENCOUNTER — Non-Acute Institutional Stay (SKILLED_NURSING_FACILITY): Payer: Medicare Other | Admitting: Family

## 2015-11-18 DIAGNOSIS — G894 Chronic pain syndrome: Secondary | ICD-10-CM

## 2015-11-18 MED ORDER — OXYCODONE HCL 5 MG PO TABS: ORAL_TABLET | ORAL | Status: DC

## 2015-11-18 NOTE — Progress Notes (Signed)
Patient ID: Joel Wilson., male   DOB: 11/26/1940, 75 y.o.   MRN: [id number]  Location:  [place] of Service:  SNF (31) Provider: Blanchie Serve , MD   [place]  Patient Care Team: [facility] as PCP - General (General Practice)  Extended Emergency Contact Information Primary Emergency Contact: Levander Campion [place] of [place] Phone: [phone] Mobile Phone: [phone] Relation: Brother Secondary Emergency Contact: [place] of [place] Phone: [phone] Mobile Phone: [phone] Relation: Son  Code Status: Full code  Goals of care: Advanced Directive information Advanced Directives 10/10/2015  Does patient have an advance directive? No  Would patient like information on creating an advanced directive? No - patient declined information     Chief Complaint  Patient presents with  . Acute Visit    Pain     HPI:  Pt is a 75 y.o. male seen today at [facility] for an acute visit for evaluation of pain.He is seen in his room today. He states current pain regimen wears off prior to due time for the next pain medication.He rates leg pain 8/10 on scale. Facility Nurse states patient's pain seems to be worsening.    Past Medical History  Diagnosis Date  . Coronary artery disease     a. 2003 PCI to Ramus;  b. 11/2012 Cath: LM nl, LAD 30, LCX nl, RI 40 ISR, RCA nl.  . Hypertensive heart disease     a. 08/2013 Echo: EF 70-75%, mild LVH.  Marland Kitchen Hyperlipidemia   . Paroxysmal atrial fibrillation (HCC)     a.08/2013-->amio;  b. CHA2DS2VASc = 4-->no longer on coumadin.   Past Surgical History  Procedure Laterality Date  . Neck surgery    . Cardiac catheterization  11/13/12    [facility]; EF >55%  . Cardiac catheterization  2003  . Joint replacement      left hip    Allergies  Allergen Reactions  . Aspirin       Medication List       This list is accurate as of:  11/18/15 12:06 PM.  Always use your most recent med list.               acetaminophen 325 MG tablet  Commonly known as:  TYLENOL  Take 650 mg by mouth every 6 (six) hours as needed for mild pain or moderate pain.     amiodarone 200 MG tablet  Commonly known as:  PACERONE  Take 1 tablet (200 mg total) by mouth at bedtime.     CALCIUM 600+D 600-400 MG-UNIT tablet  Generic drug:  Calcium Carbonate-Vitamin D  Take 1 tablet by mouth daily.     cholecalciferol 1000 units tablet  Commonly known as:  VITAMIN D  Take 1,000 Units by mouth daily.     clopidogrel 75 MG tablet  Commonly known as:  PLAVIX  Take 75 mg by mouth daily.     diltiazem 240 MG 24 hr capsule  Commonly known as:  DILACOR XR  Take 240 mg by mouth at bedtime.     finasteride 5 MG tablet  Commonly known as:  PROSCAR  Take 5 mg by mouth daily.     fluticasone 50 MCG/ACT nasal spray  Commonly known as:  FLONASE  Place 1 spray into both nostrils daily.     gabapentin 600 MG tablet  Commonly known as:  NEURONTIN  Take 1,200  mg by mouth 3 (three) times daily.     lisinopril 20 MG tablet  Commonly known as:  PRINIVIL,ZESTRIL  Take 20 mg by mouth daily.     magnesium hydroxide 400 MG/5ML suspension  Commonly known as:  MILK OF MAGNESIA  Take 30 mLs by mouth daily as needed for mild constipation or moderate constipation.     Melatonin 3 MG Tabs  Take 1 tablet (3 mg total) by mouth at bedtime.     metoprolol tartrate 25 MG tablet  Commonly known as:  LOPRESSOR  Take 25 mg by mouth 2 (two) times daily.     nitroGLYCERIN 0.4 MG SL tablet  Commonly known as:  NITROSTAT  Place 0.4 mg under the tongue every 5 (five) minutes as needed for chest pain.     oxyCODONE 5 MG immediate release tablet  Commonly known as:  Oxy IR/ROXICODONE  Take 1 tablet (5 mg total) by mouth every 6 (six) hours as needed for moderate pain or severe pain.     senna 8.6 MG tablet  Commonly known as:  SENOKOT  Take 2 tablets by mouth  2 (two) times daily.     tamsulosin 0.4 MG Caps capsule  Commonly known as:  FLOMAX  Take 0.4 mg by mouth daily.        Review of Systems  Constitutional: Negative for fever, chills, activity change and appetite change.  HENT: Negative for congestion, rhinorrhea, sinus pressure, sneezing and sore throat.   Eyes: Negative.   Respiratory: Negative for cough, chest tightness, shortness of breath and wheezing.   Cardiovascular: Negative for chest pain, palpitations and leg swelling.  Gastrointestinal: Negative for nausea, vomiting, abdominal pain, diarrhea, constipation and abdominal distention.  Genitourinary: Negative for dysuria, urgency, frequency and flank pain.  Musculoskeletal: Positive for gait problem.       Worsening leg pain.   Skin: Negative.   Neurological:       Tingling and burning sensation on the legs  Psychiatric/Behavioral: Negative for hallucinations, confusion and agitation.    Immunization History  Administered Date(s) Administered  . PPD Test 10/12/2015   Pertinent  Health Maintenance Due  Topic Date Due  . COLONOSCOPY  05/26/1990  . PNA vac Low Risk Adult (1 of 2 - PCV13) 05/26/2005  . INFLUENZA VACCINE  04/04/2015   No flowsheet data found. Functional Status Survey:    Filed Vitals:   11/18/15 1200  BP: 129/55  Pulse: 74  Temp: 97.6 F (36.4 C)  Resp: 18  Weight: 206 lb 6.4 oz (93.622 kg)  SpO2: 98%   Body mass index is 28.8 kg/(m^2). Physical Exam  Labs reviewed:  Recent Labs  10/10/15 10/10/15 0849  NA 139 139  K  --  4.2  CL  --  106  CO2  --  26  GLUCOSE  --  105*  BUN 12 12  CREATININE 1.1 1.09  CALCIUM  --  9.3    Recent Labs  10/10/15 0849  AST 31  ALT 38  ALKPHOS 45  BILITOT 0.8  PROT 8.0  ALBUMIN 4.2    Recent Labs  10/10/15 10/10/15 0849  WBC 11.6 11.6*  NEUTROABS  --  8.4*  HGB  --  15.4  HCT  --  45.7  MCV  --  95.3  PLT  --  207   Lab Results  Component Value Date   TSH 1.246 10/10/2015   Lab  Results  Component Value Date   HGBA1C 6.0 09/17/2011  Lab Results  Component Value Date   CHOL 98 08/29/2013   HDL 28* 08/29/2013   LDLCALC 37 08/29/2013   TRIG 163 08/29/2013    Significant Diagnostic Results in last 30 days:  No results found.  Assessment/Plan  Chronic pain syndrome Bilateral leg pain current medication ineffective. Will change oxycodone IR to 5 mg Tablet one tablet for moderate pain and 2 tablets for severe pain every 6 HRS PRN Continue Gabapentin.    Family/ staff Communication: Reviewed plan with patient and Pharmacist, hospital.   Labs/tests ordered:  None

## 2015-12-09 ENCOUNTER — Non-Acute Institutional Stay (SKILLED_NURSING_FACILITY): Payer: Medicare Other | Admitting: Family

## 2015-12-09 ENCOUNTER — Encounter: Payer: Self-pay | Admitting: Family

## 2015-12-09 DIAGNOSIS — E559 Vitamin D deficiency, unspecified: Secondary | ICD-10-CM | POA: Diagnosis not present

## 2015-12-09 DIAGNOSIS — R269 Unspecified abnormalities of gait and mobility: Secondary | ICD-10-CM

## 2015-12-09 DIAGNOSIS — N400 Enlarged prostate without lower urinary tract symptoms: Secondary | ICD-10-CM | POA: Diagnosis not present

## 2015-12-09 DIAGNOSIS — I119 Hypertensive heart disease without heart failure: Secondary | ICD-10-CM | POA: Diagnosis not present

## 2015-12-09 DIAGNOSIS — G4700 Insomnia, unspecified: Secondary | ICD-10-CM

## 2015-12-09 DIAGNOSIS — I480 Paroxysmal atrial fibrillation: Secondary | ICD-10-CM | POA: Diagnosis not present

## 2015-12-09 NOTE — Progress Notes (Signed)
Location:  [place] Room Number: [id number] Place of Service:  SNF (31) Provider: Marlowe Sax, FNP-C  [place]  Patient Care Team: [facility] as PCP - General (General Practice)  Extended Emergency Contact Information Primary Emergency Contact: Levander [place] of [place] Phone: [phone] Mobile Phone: [phone] Relation: Brother Secondary Emergency Contact: [place] of [place] Phone: [phone] Mobile Phone: [phone] Relation: Son  Code Status:  Full Code  Goals of care: Advanced Directive information Advanced Directives 10/10/2015  Does patient have an advance directive? No  Would patient like information on creating an advanced directive? No - patient declined information     Chief Complaint  Patient presents with  . Medical Management of Chronic Issues    Routine Visit     HPI:  Pt is a 74 y.o. male seen today at [facility] for medical management of chronic diseases. He has a medical History of HTN, Afib, CAD, BPH, insomnia among others. He is seen in his room today. He denies any acute issues. He states had a cough about two weeks ago but has resolved.Facility staff reports no new concerns. He continues to self propel on wheelchair on facility hall ways.   Past Medical History  Diagnosis Date  . Coronary artery disease     a. 2003 PCI to Ramus;  b. 11/2012 Cath: LM nl, LAD 30, LCX nl, RI 40 ISR, RCA nl.  . Hypertensive heart disease     a. 08/2013 Echo: EF 70-75%, mild LVH.  Marland Kitchen Hyperlipidemia   . Paroxysmal atrial fibrillation (HCC)     a.08/2013-->amio;  b. CHA2DS2VASc = 4-->no longer on coumadin.   Past Surgical History  Procedure Laterality Date  . Neck surgery    . Cardiac catheterization  11/13/12    [facility]; EF >55%  . Cardiac catheterization  2003  . Joint replacement      left hip    Allergies  Allergen Reactions  .  Aspirin       Medication List       This list is accurate as of: 12/09/15  9:21 AM.  Always use your most recent med list.               acetaminophen 325 MG tablet  Commonly known as:  TYLENOL  Take 650 mg by mouth every 6 (six) hours as needed for mild pain or moderate pain.     amiodarone 200 MG tablet  Commonly known as:  PACERONE  Take 1 tablet (200 mg total) by mouth at bedtime.     CALCIUM 600+D 600-400 MG-UNIT tablet  Generic drug:  Calcium Carbonate-Vitamin D  Take 1 tablet by mouth daily.     cholecalciferol 1000 units tablet  Commonly known as:  VITAMIN D  Take 1,000 Units by mouth daily.     clopidogrel 75 MG tablet  Commonly known as:  PLAVIX  Take 75 mg by mouth daily.     diltiazem 240 MG 24 hr capsule  Commonly known as:  DILACOR XR  Take 240 mg by mouth at bedtime. Hold if SBP < 110 or pulse < 60     finasteride 5 MG tablet  Commonly known as:  PROSCAR  Take 5 mg by mouth daily.     fluticasone 50 MCG/ACT nasal spray  Commonly known as:  FLONASE  Place 1 spray into both nostrils daily.     gabapentin 600  MG tablet  Commonly known as:  NEURONTIN  Take 1,200 mg by mouth 3 (three) times daily.     lisinopril 20 MG tablet  Commonly known as:  PRINIVIL,ZESTRIL  Take 20 mg by mouth daily.     magnesium hydroxide 400 MG/5ML suspension  Commonly known as:  MILK OF MAGNESIA  Take 30 mLs by mouth daily as needed for mild constipation or moderate constipation.     Melatonin 3 MG Tabs  Take 1 tablet (3 mg total) by mouth at bedtime.     metoprolol tartrate 25 MG tablet  Commonly known as:  LOPRESSOR  Take 25 mg by mouth 2 (two) times daily. Hold for SBP < 110 or heart rate < 60     nitroGLYCERIN 0.4 MG SL tablet  Commonly known as:  NITROSTAT  Place 0.4 mg under the tongue every 5 (five) minutes as needed for chest pain.     oxycodone 5 MG capsule  Commonly known as:  OXY-IR  1 by mouth every 6 hours as needed for moderate pain, 2 by mouth  every 6 hours as needed for severe pain     senna 8.6 MG tablet  Commonly known as:  SENOKOT  Take 2 tablets by mouth 2 (two) times daily.     tamsulosin 0.4 MG Caps capsule  Commonly known as:  FLOMAX  Take 0.4 mg by mouth daily.        Review of Systems  Constitutional: Negative for fever, chills, activity change, appetite change and fatigue.  HENT: Negative for congestion, hearing loss, rhinorrhea, sinus pressure, sneezing and sore throat.   Eyes: Negative.   Respiratory: Negative for cough, chest tightness, shortness of breath and wheezing.   Gastrointestinal: Negative for nausea, vomiting, abdominal pain, diarrhea, constipation, blood in stool and abdominal distention.  Endocrine: Negative.   Genitourinary: Negative for dysuria, urgency, frequency, hematuria and flank pain.  Musculoskeletal: Positive for gait problem.       Uses wheelchair   Skin: Negative.   Neurological: Negative.   Hematological: Negative.   Psychiatric/Behavioral: Negative.     Immunization History  Administered Date(s) Administered  . PPD Test 10/12/2015, 10/29/2015   Pertinent  Health Maintenance Due  Topic Date Due  . COLONOSCOPY  05/26/1990  . PNA vac Low Risk Adult (1 of 2 - PCV13) 05/26/2005  . INFLUENZA VACCINE  04/03/2016   No flowsheet data found. Functional Status Survey:    Filed Vitals:   12/09/15 0908  BP: 128/75  Pulse: 77  Temp: 98.7 F (37.1 C)  Resp: 22  Height: 5\' 11"  (1.803 m)  Weight: 203 lb 6.4 oz (92.262 kg)  SpO2: 96%   Body mass index is 28.38 kg/(m^2). Physical Exam  Constitutional: He is oriented to person, place, and time. He appears well-developed and well-nourished. No distress.  HENT:  Head: Normocephalic.  Mouth/Throat: Oropharynx is clear and moist.  Eyes: Conjunctivae and EOM are normal. Pupils are equal, round, and reactive to light. Right eye exhibits no discharge. Left eye exhibits no discharge. No scleral icterus.  Neck: Normal range of motion.  No JVD present.  Cardiovascular: Normal rate and intact distal pulses.  Exam reveals no gallop and no friction rub.   No murmur heard. Pulmonary/Chest: Effort normal. No respiratory distress. He has no wheezes. He has no rales.  Abdominal: Soft. Bowel sounds are normal. He exhibits no distension. There is no tenderness. There is no rebound and no guarding.  Musculoskeletal: Normal range of motion. He exhibits no  edema or tenderness.  Lower extremity weakness   Lymphadenopathy:    He has no cervical adenopathy.  Neurological: He is oriented to person, place, and time.  Skin: Skin is warm and dry. No erythema.  Bilateral lower extremities dry scaly skin.   Psychiatric: He has a normal mood and affect.    Labs reviewed:  Recent Labs  10/10/15 10/10/15 0849 10/17/15  NA 139 139 136*  K  --  4.2 4.6  CL  --  106  --   CO2  --  26  --   GLUCOSE  --  105*  --   BUN 12 12 20   CREATININE 1.1 1.09 1.0  CALCIUM  --  9.3  --     Recent Labs  10/10/15 0849 10/17/15  AST 31 26  ALT 38 41*  ALKPHOS 45 37  BILITOT 0.8  --   PROT 8.0  --   ALBUMIN 4.2  --     Recent Labs  10/10/15 10/10/15 0849 10/17/15  WBC 11.6 11.6* 10.0  NEUTROABS  --  8.4*  --   HGB  --  15.4 15.1  HCT  --  45.7 44  MCV  --  95.3  --   PLT  --  207 231   Lab Results  Component Value Date   TSH 1.246 10/10/2015   Lab Results  Component Value Date   HGBA1C 6.0 09/17/2011   Lab Results  Component Value Date   CHOL 98 08/29/2013   HDL 28* 08/29/2013   LDLCALC 37 08/29/2013   TRIG 163 08/29/2013    Significant Diagnostic Results in last 30 days:  No results found.  Assessment/Plan 1. Hypertensive Heart disease without HF B/p stable. Continue on Metoprolol and Lisinopril. Monitor CBC,BMP Q 3 months per facility protocol.   2. AFib Heart Rate controlled. Chest pain free. Continue on Diltiazem 240 mg 24 HR capsule, Metoprolol and amiodarone.   3. BPH  Stable. Continue on Finasteride 5 mg  Tablet and Tamsulosin.   4. Insomnia  Melatonin effective. Continue to encourage to participate on facility activities. Sleep Hygiene.   5. Vitamin D deficiency  Continue on Calcium/ Vit D supplements. Vit D level 12/12/2015    Family/ staff Communication: Reviewed plan of care with patient and facility Nurse supervisor.   Labs/tests ordered: Hgb A1C, Lipid panel, Vit D level 12/12/2015 Hgb1C,CBC, BMP, Q 3 Months per facility protocol.

## 2015-12-12 DIAGNOSIS — E559 Vitamin D deficiency, unspecified: Secondary | ICD-10-CM | POA: Diagnosis not present

## 2015-12-12 DIAGNOSIS — E0829 Diabetes mellitus due to underlying condition with other diabetic kidney complication: Secondary | ICD-10-CM | POA: Diagnosis not present

## 2015-12-12 DIAGNOSIS — Z79899 Other long term (current) drug therapy: Secondary | ICD-10-CM | POA: Diagnosis not present

## 2015-12-12 LAB — HEMOGLOBIN A1C: HEMOGLOBIN A1C: 5.9

## 2015-12-12 LAB — LIPID PANEL
Cholesterol: 155 mg/dL (ref 0–200)
HDL: 32 mg/dL — AB (ref 35–70)
LDL CALC: 97 mg/dL
TRIGLYCERIDES: 130 mg/dL (ref 40–160)

## 2015-12-12 LAB — TSH: TSH: 0.5 u[IU]/mL (ref 0.41–5.90)

## 2016-01-06 ENCOUNTER — Non-Acute Institutional Stay (SKILLED_NURSING_FACILITY): Payer: Medicare Other | Admitting: Family

## 2016-01-06 DIAGNOSIS — I119 Hypertensive heart disease without heart failure: Secondary | ICD-10-CM | POA: Diagnosis not present

## 2016-01-06 DIAGNOSIS — I2510 Atherosclerotic heart disease of native coronary artery without angina pectoris: Secondary | ICD-10-CM | POA: Diagnosis not present

## 2016-01-06 DIAGNOSIS — N400 Enlarged prostate without lower urinary tract symptoms: Secondary | ICD-10-CM

## 2016-01-06 DIAGNOSIS — G609 Hereditary and idiopathic neuropathy, unspecified: Secondary | ICD-10-CM

## 2016-01-06 DIAGNOSIS — R269 Unspecified abnormalities of gait and mobility: Secondary | ICD-10-CM | POA: Diagnosis not present

## 2016-01-06 DIAGNOSIS — G4700 Insomnia, unspecified: Secondary | ICD-10-CM

## 2016-01-06 DIAGNOSIS — E559 Vitamin D deficiency, unspecified: Secondary | ICD-10-CM | POA: Diagnosis not present

## 2016-01-06 DIAGNOSIS — I480 Paroxysmal atrial fibrillation: Secondary | ICD-10-CM

## 2016-01-06 NOTE — Progress Notes (Signed)
Location:  [place] Room Number: [place] of Service:  SNF (31) Provider:  Marlowe Sax, FNP-C Blanchie Serve, MD   [place]  Patient Care Team: [facility] as PCP - General (General Practice)  Extended Emergency Contact Information Primary Emergency Contact: Levander Campion [place] of [place] Phone: [phone] Mobile Phone: [phone] Relation: Brother Secondary Emergency Contact: [place] of [place] Phone: [phone] Mobile Phone: [phone] Relation: Son  Code Status: Full Code  Goals of care: Advanced Directive information Advanced Directives 10/10/2015  Does patient have an advance directive? No  Would patient like information on creating an advanced directive? No - patient declined information     Chief Complaint  Patient presents with  . Medical Management of Chronic Issues    Routine Visit    HPI:  Pt is a 75 y.o. male seen today at [facility] for medical management of chronic diseases. He has a medical history of HTN, chronic Afib, Insomnia, BPH among others. He is seen in his room today. He denies any acute issues this visit. He continues to self propel on wheelchair on  Facility Hallways. Facility staff reports no new concerns.   Past Medical History  Diagnosis Date  . Coronary artery disease     a. 2003 PCI to Ramus;  b. 11/2012 Cath: LM nl, LAD 30, LCX nl, RI 40 ISR, RCA nl.  . Hypertensive heart disease     a. 08/2013 Echo: EF 70-75%, mild LVH.  Marland Kitchen Hyperlipidemia   . Paroxysmal atrial fibrillation (HCC)     a.08/2013-->amio;  b. CHA2DS2VASc = 4-->no longer on coumadin.   Past Surgical History  Procedure Laterality Date  . Neck surgery    . Cardiac catheterization  11/13/12    [facility]; EF >55%  . Cardiac catheterization  2003  . Joint replacement      left hip    Allergies  Allergen Reactions  . Aspirin       Medication List        This list is accurate as of: 01/06/16  1:20 PM.  Always use your most recent med list.               acetaminophen 325 MG tablet  Commonly known as:  TYLENOL  Take 650 mg by mouth every 6 (six) hours as needed for mild pain or moderate pain.     amiodarone 200 MG tablet  Commonly known as:  PACERONE  Take 1 tablet (200 mg total) by mouth at bedtime.     CALCIUM 600+D 600-400 MG-UNIT tablet  Generic drug:  Calcium Carbonate-Vitamin D  Take 1 tablet by mouth daily.     cholecalciferol 1000 units tablet  Commonly known as:  VITAMIN D  Take 1,000 Units by mouth daily.     clopidogrel 75 MG tablet  Commonly known as:  PLAVIX  Take 75 mg by mouth daily.     diltiazem 240 MG 24 hr capsule  Commonly known as:  DILACOR XR  Take 240 mg by mouth at bedtime. Hold if SBP < 110 or pulse < 60     finasteride 5 MG tablet  Commonly known as:  PROSCAR  Take 5 mg by mouth daily.     fluticasone 50 MCG/ACT nasal spray  Commonly known as:  FLONASE  Place 1 spray into both nostrils daily.     gabapentin 600 MG tablet  Commonly known  as:  NEURONTIN  Take 1,200 mg by mouth 3 (three) times daily.     lisinopril 20 MG tablet  Commonly known as:  PRINIVIL,ZESTRIL  Take 20 mg by mouth daily. Hold if SBP < 110     magnesium hydroxide 400 MG/5ML suspension  Commonly known as:  MILK OF MAGNESIA  Take 30 mLs by mouth daily as needed for mild constipation or moderate constipation.     Melatonin 3 MG Tabs  Take 1 tablet (3 mg total) by mouth at bedtime.     metoprolol tartrate 25 MG tablet  Commonly known as:  LOPRESSOR  Take 25 mg by mouth 2 (two) times daily. Hold for SBP < 110 or heart rate < 60     mineral oil-hydrophilic petrolatum ointment  Apply ointment to both legs/feet twice daily x 4 weeks for dryness.  Stop Date 01/07/16     nitroGLYCERIN 0.4 MG SL tablet  Commonly known as:  NITROSTAT  Place 0.4 mg under the tongue every 5 (five) minutes as needed for chest pain.      oxycodone 5 MG capsule  Commonly known as:  OXY-IR  1 by mouth every 6 hours as needed for moderate pain, 2 by mouth every 6 hours as needed for severe pain     senna 8.6 MG tablet  Commonly known as:  SENOKOT  Take 2 tablets by mouth 2 (two) times daily.     tamsulosin 0.4 MG Caps capsule  Commonly known as:  FLOMAX  Take 0.4 mg by mouth daily.        Review of Systems  Constitutional: Negative for fever, chills, activity change, appetite change and fatigue.  HENT: Negative for congestion, hearing loss, rhinorrhea, sinus pressure, sneezing and sore throat.   Eyes: Negative.   Respiratory: Negative for cough, chest tightness, shortness of breath and wheezing.   Gastrointestinal: Negative for nausea, vomiting, abdominal pain, diarrhea, constipation, blood in stool and abdominal distention.  Endocrine: Negative.   Genitourinary: Negative for dysuria, urgency, frequency, hematuria and flank pain.  Musculoskeletal: Positive for gait problem.       Uses wheelchair   Skin: Negative.   Neurological: Negative for dizziness, seizures and headaches.       Numbness and tingling to feet   Hematological: Negative.   Psychiatric/Behavioral: Negative.     Immunization History  Administered Date(s) Administered  . PPD Test 10/12/2015, 10/29/2015   Pertinent  Health Maintenance Due  Topic Date Due  . COLONOSCOPY  09/03/2016 (Originally 05/26/1990)  . PNA vac Low Risk Adult (1 of 2 - PCV13) 09/04/2023 (Originally 05/26/2005)  . INFLUENZA VACCINE  04/03/2016   No flowsheet data found. Functional Status Survey:    Filed Vitals:   01/06/16 1254  BP: 125/54  Pulse: 56  Temp: 98.1 F (36.7 C)  Resp: 20  Height: 5\' 11"  (1.803 m)  Weight: 197 lb 6.4 oz (89.54 kg)  SpO2: 99%   Body mass index is 27.54 kg/(m^2). Physical Exam  Constitutional: He is oriented to person, place, and time. He appears well-developed and well-nourished. No distress.  HENT:  Head: Normocephalic.    Mouth/Throat: Oropharynx is clear and moist.  Eyes: Conjunctivae and EOM are normal. Pupils are equal, round, and reactive to light. Right eye exhibits no discharge. Left eye exhibits no discharge. No scleral icterus.  Neck: Normal range of motion. No JVD present. No thyromegaly present.  Cardiovascular: Normal rate, regular rhythm, normal heart sounds and intact distal pulses.  Exam reveals no gallop  and no friction rub.   No murmur heard. Pulmonary/Chest: Effort normal and breath sounds normal. No respiratory distress. He has no wheezes. He has no rales.  Abdominal: Soft. Bowel sounds are normal. He exhibits no distension. There is no tenderness. There is no rebound and no guarding.  Musculoskeletal: Normal range of motion. He exhibits no edema or tenderness.  Bilateral leg weakness.   Lymphadenopathy:    He has no cervical adenopathy.  Neurological: He is oriented to person, place, and time.  Skin: Skin is warm and dry. No rash noted. No erythema. No pallor.  Psychiatric: He has a normal mood and affect.    Labs reviewed:  Recent Labs  10/10/15 10/10/15 0849 10/17/15  NA 139 139 136*  K  --  4.2 4.6  CL  --  106  --   CO2  --  26  --   GLUCOSE  --  105*  --   BUN 12 12 20   CREATININE 1.1 1.09 1.0  CALCIUM  --  9.3  --     Recent Labs  10/10/15 0849 10/17/15  AST 31 26  ALT 38 41*  ALKPHOS 45 37  BILITOT 0.8  --   PROT 8.0  --   ALBUMIN 4.2  --     Recent Labs  10/10/15 10/10/15 0849 10/17/15  WBC 11.6 11.6* 10.0  NEUTROABS  --  8.4*  --   HGB  --  15.4 15.1  HCT  --  45.7 44  MCV  --  95.3  --   PLT  --  207 231   Lab Results  Component Value Date   TSH 0.50 12/12/2015   Lab Results  Component Value Date   HGBA1C 5.9 12/12/2015   Lab Results  Component Value Date   CHOL 155 12/12/2015   HDL 32* 12/12/2015   LDLCALC 97 12/12/2015   TRIG 130 12/12/2015    Significant Diagnostic Results in last 30 days:  No results  found.  Assessment/Plan  Afib HR controlled.Exam finding negative. Continue on Metoprolol 25 mg Tablet and diltiazem XR 240 mg tablet.   HTN B/p stable. Continue on Metoprolol 25 mg Tablet, Amiodrone 200 mg Tablet , Diltiazem XR 240 mg Tablet, Lisinopril 20 mg Tablet. Monitor BMP  CAD Chest pain free. Continue to control high risk factors. Continue on Plavix 75 mg Tablet daily. Nitroglycerin  0.4 mg SL Tablet PRN for chest pain.  BPH Stable. Continue on Flomax 0.4 mg Capsule and Finasteride 5 mg Tablet   Vit D deficiency  Continue on Calcium carbonate -Vit D 600-400 mg Tablet daily. Monitor Vit D level.   Abnormal Gait  Self propels on wheelchair. Fall and safety precautions.   Insomnia  Melatonin 3 mg Tablet effective.   Neuropathy Worse on legs. Gabapentin 600 mg Tablet effective.     Family/ staff Communication: Plan of care reviewed with patient and facility Nurse supervisor.   Labs/tests ordered:  None

## 2016-01-19 ENCOUNTER — Other Ambulatory Visit: Payer: Self-pay | Admitting: *Deleted

## 2016-01-19 MED ORDER — OXYCODONE HCL 5 MG PO CAPS: ORAL_CAPSULE | ORAL | Status: DC

## 2016-01-19 NOTE — Telephone Encounter (Signed)
[facility] 

## 2016-02-02 ENCOUNTER — Encounter: Payer: Self-pay | Admitting: Family

## 2016-02-02 ENCOUNTER — Non-Acute Institutional Stay (SKILLED_NURSING_FACILITY): Payer: Medicare Other | Admitting: Family

## 2016-02-02 DIAGNOSIS — I119 Hypertensive heart disease without heart failure: Secondary | ICD-10-CM

## 2016-02-02 DIAGNOSIS — N400 Enlarged prostate without lower urinary tract symptoms: Secondary | ICD-10-CM | POA: Diagnosis not present

## 2016-02-02 DIAGNOSIS — I482 Chronic atrial fibrillation, unspecified: Secondary | ICD-10-CM

## 2016-02-02 DIAGNOSIS — G609 Hereditary and idiopathic neuropathy, unspecified: Secondary | ICD-10-CM | POA: Diagnosis not present

## 2016-02-02 DIAGNOSIS — I2510 Atherosclerotic heart disease of native coronary artery without angina pectoris: Secondary | ICD-10-CM

## 2016-02-02 NOTE — Progress Notes (Signed)
Patient ID: Joel Snouffer., male   DOB: October 25, 1940, 75 y.o.   MRN: [id number]  Location:  [place] Room Number: [id number] Place of Service:  SNF (31) Provider:  Dinah Ngetich FNP-C   Blanchie Serve, MD  Patient Care Team: Blanchie Serve, MD as PCP - General (Internal Medicine)  Extended Emergency Contact Information Primary Emergency Contact: Joel Wilson [place] Phone: [phone] Mobile Phone: [phone] Relation: Brother Secondary Emergency Contact: Joel Wilson [place] of [place] Phone: [phone] Mobile Phone: [phone] Relation: Son  Code Status:  Full Code  Goals of care: Advanced Directive information Advanced Directives 10/10/2015  Does patient have an advance directive? No  Would patient like information on creating an advanced directive? No - patient declined information     Chief Complaint  Patient presents with  . Medical Management of Chronic Issues    HPI:  Pt is a 75 y.o. male seen today at [facility] for medical management of chronic diseases. He has a medical history of HTN, CAD, Afib, BPH, Neuropathy among others.He is seen in his room today. He complains of feeling tired all the time. He denies any fever, chills, dizziness or Headache. Facility B/p log ranging from 100's/50's to 140's/50's and HR 50's-70's. His metoprolol held most of the time with SB/p <110 and HR < 60 .     Past Medical History  Diagnosis Date  . Coronary artery disease     a. 2003 PCI to Ramus;  b. 11/2012 Cath: LM nl, LAD 30, LCX nl, RI 40 ISR, RCA nl.  . Hypertensive heart disease     a. 08/2013 Echo: EF 70-75%, mild LVH.  Marland Kitchen Hyperlipidemia   . Paroxysmal atrial fibrillation (HCC)     a.08/2013-->amio;  b. CHA2DS2VASc = 4-->no longer on coumadin.  Marland Kitchen BPH without obstruction/lower urinary tract symptoms   . Vitamin D deficiency   . Insomnia   . Hereditary and idiopathic peripheral  neuropathy    Past Surgical History  Procedure Laterality Date  . Neck surgery    . Cardiac catheterization  11/13/12    [facility]; EF >55%  . Cardiac catheterization  2003  . Joint replacement      left hip    Allergies  Allergen Reactions  . Aspirin       Medication List       This list is accurate as of: 02/02/16  6:29 PM.  Always use your most recent med list.               acetaminophen 325 MG tablet  Commonly known as:  TYLENOL  Take 650 mg by mouth every 6 (six) hours as needed for mild pain or moderate pain.     amiodarone 200 MG tablet  Commonly known as:  PACERONE  Take 1 tablet (200 mg total) by mouth at bedtime.     CALCIUM 600+D 600-400 MG-UNIT tablet  Generic drug:  Calcium Carbonate-Vitamin D  Take 1 tablet by mouth daily.     cholecalciferol 1000 units tablet  Commonly known as:  VITAMIN D  Take 1,000 Units by mouth daily.     clopidogrel 75 MG tablet  Commonly known as:  PLAVIX  Take 75 mg by mouth daily.     diltiazem 240 MG 24 hr capsule  Commonly known as:  DILACOR XR  Take 240 mg by mouth at bedtime. Hold if SBP < 110 or pulse < 60  finasteride 5 MG tablet  Commonly known as:  PROSCAR  Take 5 mg by mouth daily.     fluticasone 50 MCG/ACT nasal spray  Commonly known as:  FLONASE  Place 1 spray into both nostrils daily.     gabapentin 600 MG tablet  Commonly known as:  NEURONTIN  Take 1,200 mg by mouth 3 (three) times daily.     lisinopril 20 MG tablet  Commonly known as:  PRINIVIL,ZESTRIL  Take 20 mg by mouth daily. Hold if SBP < 110     magnesium hydroxide 400 MG/5ML suspension  Commonly known as:  MILK OF MAGNESIA  Take 30 mLs by mouth daily as needed for mild constipation or moderate constipation.     Melatonin 3 MG Tabs  Take 1 tablet (3 mg total) by mouth at bedtime.     metoprolol tartrate 25 MG tablet  Commonly known as:  LOPRESSOR  Take 25 mg by mouth 2 (two) times daily. Hold for SBP < 110 or heart rate < 60      nitroGLYCERIN 0.4 MG SL tablet  Commonly known as:  NITROSTAT  Place 0.4 mg under the tongue every 5 (five) minutes as needed for chest pain.     oxycodone 5 MG capsule  Commonly known as:  OXY-IR  Take 1 by mouth every 6 hours as needed for moderate pain; Take 2 tablets by mouth every 6 hours as needed for severe pain     senna 8.6 MG tablet  Commonly known as:  SENOKOT  Take 2 tablets by mouth 2 (two) times daily.     tamsulosin 0.4 MG Caps capsule  Commonly known as:  FLOMAX  Take 0.4 mg by mouth daily.        Review of Systems  Constitutional: Positive for fatigue. Negative for fever, chills, activity change and appetite change.  HENT: Negative for congestion, hearing loss, rhinorrhea, sinus pressure, sneezing and sore throat.   Eyes: Negative.   Respiratory: Negative for cough, chest tightness, shortness of breath and wheezing.   Gastrointestinal: Negative for nausea, vomiting, abdominal pain, diarrhea, constipation, blood in stool and abdominal distention.  Endocrine: Negative.   Genitourinary: Negative for dysuria, urgency, frequency, hematuria and flank pain.  Musculoskeletal: Positive for gait problem.       Uses wheelchair   Skin: Negative.   Neurological: Negative for dizziness, seizures and headaches.       Numbness and tingling to feet   Hematological: Negative.   Psychiatric/Behavioral: Negative.     Immunization History  Administered Date(s) Administered  . PPD Test 10/12/2015, 10/29/2015   Pertinent  Health Maintenance Due  Topic Date Due  . COLONOSCOPY  09/03/2016 (Originally 05/26/1990)  . PNA vac Low Risk Adult (1 of 2 - PCV13) 09/04/2023 (Originally 05/26/2005)  . INFLUENZA VACCINE  04/03/2016   No flowsheet data found. Functional Status Survey:    Filed Vitals:   02/02/16 1106  BP: 142/51  Pulse: 56  Temp: 98.2 F (36.8 C)  TempSrc: Oral  Resp: 18  Height: 5\' 11"  (1.803 m)  Weight: 198 lb 3.2 oz (89.903 kg)  SpO2: 98%   Body mass index  is 27.66 kg/(m^2). Physical Exam  Constitutional: He is oriented to person, place, and time. He appears well-developed and well-nourished. No distress.  HENT:  Head: Normocephalic.  Mouth/Throat: Oropharynx is clear and moist. No oropharyngeal exudate.  Eyes: Conjunctivae and EOM are normal. Pupils are equal, round, and reactive to light. Right eye exhibits no discharge. Left eye  exhibits no discharge. No scleral icterus.  Neck: Normal range of motion. No JVD present. No thyromegaly present.  Cardiovascular: Normal rate, regular rhythm, normal heart sounds and intact distal pulses.  Exam reveals no gallop and no friction rub.   No murmur heard. Pulmonary/Chest: Effort normal and breath sounds normal. No respiratory distress. He has no wheezes. He has no rales.  Abdominal: Soft. Bowel sounds are normal. He exhibits no distension. There is no tenderness. There is no rebound and no guarding.  Musculoskeletal: Normal range of motion. He exhibits no edema or tenderness.  Abnormal gait. Bilateral Lower extremities weakness   Lymphadenopathy:    He has no cervical adenopathy.  Neurological: He is oriented to person, place, and time.  Skin: Skin is warm. No rash noted. No erythema. No pallor.  Psychiatric: He has a normal mood and affect.    Labs reviewed:  Recent Labs  10/10/15 10/10/15 0849 10/17/15  NA 139 139 136*  K  --  4.2 4.6  CL  --  106  --   CO2  --  26  --   GLUCOSE  --  105*  --   BUN 12 12 20   CREATININE 1.1 1.09 1.0  CALCIUM  --  9.3  --     Recent Labs  10/10/15 0849 10/17/15  AST 31 26  ALT 38 41*  ALKPHOS 45 37  BILITOT 0.8  --   PROT 8.0  --   ALBUMIN 4.2  --     Recent Labs  10/10/15 10/10/15 0849 10/17/15  WBC 11.6 11.6* 10.0  NEUTROABS  --  8.4*  --   HGB  --  15.4 15.1  HCT  --  45.7 44  MCV  --  95.3  --   PLT  --  207 231   Lab Results  Component Value Date   TSH 0.50 12/12/2015   Lab Results  Component Value Date   HGBA1C 5.9 12/12/2015    Lab Results  Component Value Date   CHOL 155 12/12/2015   HDL 32* 12/12/2015   LDLCALC 97 12/12/2015   TRIG 130 12/12/2015    Significant Diagnostic Results in last 30 days:  No results found.  Assessment/Plan 1. Hypertensive heart disease without heart failure Facility B/p log ranging from 100's/50's to 140's/50's and HR 50's-70's. His metoprolol held most of the time with SB/p <110 and HR < 60 . Reduce Lisinopril to 10 mg tablet.will reduce Metoprolol to 12.5 mg if continues to run low B/P. Monitor B/p and HR twice daily X 1 week then resume previous orders.    2. Coronary artery disease involving native coronary artery of native heart without angina pectoris Continue Plavix 75 mg tablet.   3. Chronic atrial fibrillation (HCC) HR controlled. Continue Metoprolol 25 mg Tablet and Amiodarone 200 mg tablet.   4. Hereditary and idiopathic peripheral neuropathy Continue on Gabapentin. Fall and safety precautions.   5. BPH without obstruction/lower urinary tract symptoms Stable. Continue on Flomax 0.4 mg Capsule and finasteride 5 mg tablet .     Family/ staff Communication: Reviewed plan of care with patient and facility Nurse supervisor.  Labs/tests ordered: None

## 2016-03-07 ENCOUNTER — Other Ambulatory Visit: Payer: Self-pay

## 2016-03-07 MED ORDER — OXYCODONE HCL 5 MG PO CAPS: ORAL_CAPSULE | ORAL | Status: DC

## 2016-03-07 NOTE — Telephone Encounter (Signed)
Refill request by [facility]

## 2016-03-12 DIAGNOSIS — Z79899 Other long term (current) drug therapy: Secondary | ICD-10-CM | POA: Diagnosis not present

## 2016-03-12 LAB — BASIC METABOLIC PANEL
BUN: 16 mg/dL (ref 4–21)
CREATININE: 1.1 mg/dL (ref 0.6–1.3)
GLUCOSE: 104 mg/dL
POTASSIUM: 4.7 mmol/L (ref 3.4–5.3)
Sodium: 136 mmol/L — AB (ref 137–147)

## 2016-03-12 LAB — CBC AND DIFFERENTIAL
HCT: 47 % (ref 41–53)
HEMOGLOBIN: 15.4 g/dL (ref 13.5–17.5)
Platelets: 235 10*3/uL (ref 150–399)
WBC: 8.1 10*3/mL

## 2016-03-12 LAB — HEMOGLOBIN A1C: Hemoglobin A1C: 5.7

## 2016-03-19 ENCOUNTER — Non-Acute Institutional Stay (SKILLED_NURSING_FACILITY): Payer: Medicare Other | Admitting: Internal Medicine

## 2016-03-19 ENCOUNTER — Encounter: Payer: Self-pay | Admitting: Internal Medicine

## 2016-03-19 DIAGNOSIS — M5416 Radiculopathy, lumbar region: Secondary | ICD-10-CM

## 2016-03-19 DIAGNOSIS — I10 Essential (primary) hypertension: Secondary | ICD-10-CM | POA: Diagnosis not present

## 2016-03-19 DIAGNOSIS — M792 Neuralgia and neuritis, unspecified: Secondary | ICD-10-CM | POA: Diagnosis not present

## 2016-03-19 DIAGNOSIS — M48061 Spinal stenosis, lumbar region without neurogenic claudication: Secondary | ICD-10-CM

## 2016-03-19 DIAGNOSIS — M4806 Spinal stenosis, lumbar region: Secondary | ICD-10-CM

## 2016-03-19 DIAGNOSIS — I2510 Atherosclerotic heart disease of native coronary artery without angina pectoris: Secondary | ICD-10-CM

## 2016-03-19 NOTE — Progress Notes (Signed)
Patient ID: Joel Wilson., male   DOB: [birth year], 75 y.o.   MRN: [id number]    LOCATION: [name]  PCP: Blanchie Serve, MD   Code Status: Full Code  Goals of care: Advanced Directive information Advanced Directives 10/10/2015  Does patient have an advance directive? No  Would patient like information on creating an advanced directive? No - patient declined information    Extended Emergency Contact Information Primary Emergency Contact: Levander Campion [place] of [place] Phone: [phone] Mobile Phone: [phone] Relation: Brother Secondary Emergency Contact: Loel Lofty [place] of [place] Phone: [phone] Mobile Phone: [phone] Relation: Son   Allergies  Allergen Reactions  . Aspirin     Chief Complaint  Patient presents with  . Medical Management of Chronic Issues    Routine Visit     HPI:  Patient is a 75 y.o. male seen today for routine visit. He has been at his baseline. He denies any concern. No new concern from staff. His pain is under control with current regimen. He has chronic back pain.   Review of Systems:  Constitutional: Negative for fever, chills, diaphoresis.  HENT: Negative for headache, congestion, nasal discharge Eyes: Negative for blurred vision, double vision and discharge.  Respiratory: Negative for cough, shortness of breath and wheezing.   Cardiovascular: Negative for chest pain, palpitations, leg swelling.  Gastrointestinal: Negative for heartburn, nausea, vomiting, abdominal pain. Had bowel movement this am Genitourinary: Negative for dysuria Musculoskeletal: Negative for fall in the facility. Skin: Negative for itching, rash.  Neurological: has history of neuropathy Psychiatric/Behavioral: Negative for depression   Past Medical History  Diagnosis Date  . Coronary artery disease     a. 2003 PCI to Ramus;  b. 11/2012 Cath: LM nl, LAD 30, LCX nl, RI 40 ISR, RCA nl.  . Hypertensive heart disease       a. 08/2013 Echo: EF 70-75%, mild LVH.  Marland Kitchen Hyperlipidemia   . Paroxysmal atrial fibrillation (HCC)     a.08/2013-->amio;  b. CHA2DS2VASc = 4-->no longer on coumadin.  Marland Kitchen BPH without obstruction/lower urinary tract symptoms   . Vitamin D deficiency   . Insomnia   . Hereditary and idiopathic peripheral neuropathy     Medications:   Medication List       This list is accurate as of: 03/19/16  2:40 PM.  Always use your most recent med list.               acetaminophen 325 MG tablet  Commonly known as:  TYLENOL  Take 650 mg by mouth every 6 (six) hours as needed for mild pain or moderate pain.     amiodarone 200 MG tablet  Commonly known as:  PACERONE  Take 1 tablet (200 mg total) by mouth at bedtime.     CALCIUM 600+D 600-400 MG-UNIT tablet  Generic drug:  Calcium Carbonate-Vitamin D  Take 1 tablet by mouth daily.     cetirizine 10 MG tablet  Commonly known as:  ZYRTEC  Take 10 mg by mouth daily.     cholecalciferol 1000 units tablet  Commonly known as:  VITAMIN D  Take 1,000 Units by mouth daily.     clopidogrel 75 MG tablet  Commonly known as:  PLAVIX  Take 75 mg by mouth daily.     diltiazem 240 MG 24 hr capsule  Commonly known as:  DILACOR XR  Take 240 mg by mouth at bedtime. Hold if SBP < 110 or pulse < 60  finasteride 5 MG tablet  Commonly known as:  PROSCAR  Take 5 mg by mouth daily.     fluticasone 50 MCG/ACT nasal spray  Commonly known as:  FLONASE  Place 1 spray into both nostrils daily.     gabapentin 600 MG tablet  Commonly known as:  NEURONTIN  Take 1,200 mg by mouth 3 (three) times daily.     lisinopril 10 MG tablet  Commonly known as:  PRINIVIL,ZESTRIL  Take 10 mg by mouth daily.     magnesium hydroxide 400 MG/5ML suspension  Commonly known as:  MILK OF MAGNESIA  Take 30 mLs by mouth daily as needed for mild constipation or moderate constipation.     Melatonin 3 MG Tabs  Take 1 tablet (3 mg total) by mouth at bedtime.      metoprolol tartrate 25 MG tablet  Commonly known as:  LOPRESSOR  Take 25 mg by mouth 2 (two) times daily. Hold for SBP < 110 or heart rate < 60     nitroGLYCERIN 0.4 MG SL tablet  Commonly known as:  NITROSTAT  Place 0.4 mg under the tongue every 5 (five) minutes as needed for chest pain.     oxycodone 5 MG capsule  Commonly known as:  OXY-IR  Take 1 by mouth every 6 hours as needed for moderate pain; Take 2 tablets by mouth every 6 hours as needed for severe pain     senna 8.6 MG tablet  Commonly known as:  SENOKOT  Take 2 tablets by mouth 2 (two) times daily.     tamsulosin 0.4 MG Caps capsule  Commonly known as:  FLOMAX  Take 0.4 mg by mouth daily.        Immunizations: Immunization History  Administered Date(s) Administered  . PPD Test 10/12/2015, 10/29/2015     Physical Exam: Filed Vitals:   03/19/16 1429  BP: 112/57  Pulse: 57  Temp: 97.1 F (36.2 C)  TempSrc: Oral  Resp: 18  Height: 5\' 11"  (1.803 m)  Weight: 198 lb 3.2 oz (89.903 kg)   Body mass index is 27.66 kg/(m^2).  General- elderly male, well built, in no acute distress Head- normocephalic, atraumatic Nose-  no nasal discharge Throat- moist mucus membrane, poor dentition  Eyes- PERRLA, EOMI, no pallor, no icterus, no discharge, normal conjunctiva, normal sclera Neck- no cervical lymphadenopathy Cardiovascular- normal s1,s2, no murmurs, palpable dorsalis pedis and radial pulses, no leg edema Respiratory- bilateral clear to auscultation, no wheeze, no rhonchi, no crackles, no use of accessory muscles Abdomen- bowel sounds present, soft, non tender Musculoskeletal- able to move all 4 extremities, lower extremity weakness Neurological- alert and oriented to person, place and time Skin- warm and dry Psychiatry- normal mood and affect    Labs reviewed: Basic Metabolic Panel:  Recent Labs  10/10/15 0849 10/17/15 03/12/16  NA 139 136* 136*  K 4.2 4.6 4.7  CL 106  --   --   CO2 26  --   --    GLUCOSE 105*  --   --   BUN 12 20 16   CREATININE 1.09 1.0 1.1  CALCIUM 9.3  --   --    Liver Function Tests:  Recent Labs  10/10/15 0849 10/17/15  AST 31 26  ALT 38 41*  ALKPHOS 45 37  BILITOT 0.8  --   PROT 8.0  --   ALBUMIN 4.2  --     Recent Labs  10/10/15 0849  LIPASE 29   No results for input(s):  AMMONIA in the last 8760 hours. CBC:  Recent Labs  10/10/15 0849 10/17/15 03/12/16  WBC 11.6* 10.0 8.1  NEUTROABS 8.4*  --   --   HGB 15.4 15.1 15.4  HCT 45.7 44 47  MCV 95.3  --   --   PLT 207 231 235      Assessment/Plan  Spinal stenosis with radiculopathy Persists but medication has been helpful. Start tylenol 500 mg bid and change oxyIR to 5 mg 1 tab q8h prn pain. Continue gabapentin and tylenol 650 mg q6h prn pain.   CAD Chest pain free. Continue plavix, lisinopril, lopressor and prn NTG. Check lipid panel  HTN BP Readings from Last 3 Encounters:  03/19/16 112/57  02/02/16 142/51  01/06/16 125/54   Stable continue lopressor 25 mg bid with lisinopril 10 mg daily. Monitor bp  Neuropathic pain Continue neurontin 12000 mg tid and monitor    Blanchie Serve, MD Internal Medicine [facility] [address] [place], [place] [address] Cell Phone (Monday-Friday 8 am - 5 pm): [phone] On Call: [phone] and follow prompts after 5 pm and on weekends Office Phone: [phone] Office Fax: [phone]

## 2016-03-20 DIAGNOSIS — E784 Other hyperlipidemia: Secondary | ICD-10-CM | POA: Diagnosis not present

## 2016-03-20 LAB — LIPID PANEL
CHOLESTEROL: 189 mg/dL (ref 0–200)
HDL: 38 mg/dL (ref 35–70)
LDL Cholesterol: 126 mg/dL
TRIGLYCERIDES: 127 mg/dL (ref 40–160)

## 2016-03-23 ENCOUNTER — Encounter: Payer: Self-pay | Admitting: Family

## 2016-03-23 ENCOUNTER — Non-Acute Institutional Stay (SKILLED_NURSING_FACILITY): Payer: Medicare Other | Admitting: Family

## 2016-03-23 DIAGNOSIS — R251 Tremor, unspecified: Secondary | ICD-10-CM

## 2016-03-23 DIAGNOSIS — M25512 Pain in left shoulder: Secondary | ICD-10-CM | POA: Diagnosis not present

## 2016-03-23 DIAGNOSIS — G8929 Other chronic pain: Secondary | ICD-10-CM

## 2016-03-23 DIAGNOSIS — M545 Low back pain, unspecified: Secondary | ICD-10-CM | POA: Insufficient documentation

## 2016-03-23 DIAGNOSIS — M542 Cervicalgia: Secondary | ICD-10-CM

## 2016-03-23 DIAGNOSIS — M79605 Pain in left leg: Secondary | ICD-10-CM

## 2016-03-23 DIAGNOSIS — M25511 Pain in right shoulder: Secondary | ICD-10-CM | POA: Diagnosis not present

## 2016-03-23 DIAGNOSIS — R258 Other abnormal involuntary movements: Secondary | ICD-10-CM | POA: Diagnosis not present

## 2016-03-23 NOTE — Progress Notes (Signed)
Patient ID: Joel Gellert., male   DOB: August 12, 1941, 75 y.o.   MRN: [id number]  Location:    [place] Room Number: [id number] Place of Service:  SNF (31) Provider: Dinah Ngetich FNP-C   Blanchie Serve, MD  Patient Care Team: Blanchie Serve, MD as PCP - General (Internal Medicine)  Extended Emergency Contact Information Primary Emergency Contact: Levander Campion [place] of [place] Phone: [phone] Mobile Phone: [phone] Relation: Brother Secondary Emergency Contact: Loel Lofty [place] of [place] Phone: [phone] Mobile Phone: [phone] Relation: Son  Code Status:  Full Code  Goals of care: Advanced Directive information Advanced Directives 10/10/2015  Does patient have an advance directive? No  Would patient like information on creating an advanced directive? No - patient declined information     Chief Complaint  Patient presents with  . Acute Visit    Pain Management    HPI:  Pt is a 75 y.o. male seen today at [facility]  for an acute visit for evaluation of back pain. He is seen in his room today. He complains of neck, shoulder, lower back pain rating 9/10 on scale. He states worsening hand tremors and numbness on the fingers. He has noticed more difficulties when holding his coffee cup. He states pain medication was changed by MD recently. OXY IR reduced to 5 mg tablet Q 8 HRS PRN and Extra strength Tylenol twice daily added.  He states Extra strength Tylenol does not relief his pain. Pain wakes him up at night. He has also had difficulties getting around on the wheelchair due to pain.  He request pain medication to be switched back to previous dose that was effective. He denies any fever, chills or worsening numbness or tingling on legs. He was following up with Neurologist at the [facility] in [facility]. His Lumbar Spine MRI 10/10/2015 showed multifactorial spinal stenosis. He request follow up with [facility]  neurologist for worsening hand tremors and Numbness. Facility Nurse reports no new concerns.    Past Medical History  Diagnosis Date  . Coronary artery disease     a. 2003 PCI to Ramus;  b. 11/2012 Cath: LM nl, LAD 30, LCX nl, RI 40 ISR, RCA nl.  . Hypertensive heart disease     a. 08/2013 Echo: EF 70-75%, mild LVH.  Marland Kitchen Hyperlipidemia   . Paroxysmal atrial fibrillation (HCC)     a.08/2013-->amio;  b. CHA2DS2VASc = 4-->no longer on coumadin.  Marland Kitchen BPH without obstruction/lower urinary tract symptoms   . Vitamin D deficiency   . Insomnia   . Hereditary and idiopathic peripheral neuropathy    Past Surgical History  Procedure Laterality Date  . Neck surgery    . Cardiac catheterization  11/13/12    [facility]; EF >55%  . Cardiac catheterization  2003  . Joint replacement      left hip    Allergies  Allergen Reactions  . Aspirin       Medication List       This list is accurate as of: 03/23/16  1:45 PM.  Always use your most recent med list.               acetaminophen 500 MG tablet  Commonly known as:  TYLENOL  Take 500 mg by mouth 2 (two) times daily.     amiodarone 200 MG tablet  Commonly known as:  PACERONE  Take 1 tablet (200 mg total) by mouth at bedtime.  CALCIUM 600+D 600-400 MG-UNIT tablet  Generic drug:  Calcium Carbonate-Vitamin D  Take 1 tablet by mouth daily. Reported on 03/23/2016     cetirizine 10 MG tablet  Commonly known as:  ZYRTEC  Take 10 mg by mouth daily.     cholecalciferol 1000 units tablet  Commonly known as:  VITAMIN D  Take 1,000 Units by mouth daily.     clopidogrel 75 MG tablet  Commonly known as:  PLAVIX  Take 75 mg by mouth daily.     diltiazem 240 MG 24 hr capsule  Commonly known as:  DILACOR XR  Take 240 mg by mouth at bedtime. Hold if SBP < 110 or pulse < 60     finasteride 5 MG tablet  Commonly known as:  PROSCAR  Take 5 mg by mouth daily.     fluticasone 50 MCG/ACT nasal spray  Commonly known as:  FLONASE  Place 1 spray  into both nostrils daily.     gabapentin 600 MG tablet  Commonly known as:  NEURONTIN  Take 1,200 mg by mouth 3 (three) times daily.     lisinopril 10 MG tablet  Commonly known as:  PRINIVIL,ZESTRIL  Take 10 mg by mouth daily.     magnesium hydroxide 400 MG/5ML suspension  Commonly known as:  MILK OF MAGNESIA  Take 30 mLs by mouth daily as needed for mild constipation or moderate constipation.     Melatonin 3 MG Tabs  Take 1 tablet (3 mg total) by mouth at bedtime.     metoprolol tartrate 25 MG tablet  Commonly known as:  LOPRESSOR  Take 25 mg by mouth 2 (two) times daily. Hold for SBP < 110 or heart rate < 60     nitroGLYCERIN 0.4 MG SL tablet  Commonly known as:  NITROSTAT  Place 0.4 mg under the tongue every 5 (five) minutes as needed for chest pain.     oxycodone 5 MG capsule  Commonly known as:  OXY-IR  Take 5 mg by mouth every 8 (eight) hours as needed for pain.     senna 8.6 MG tablet  Commonly known as:  SENOKOT  Take 2 tablets by mouth 2 (two) times daily.     tamsulosin 0.4 MG Caps capsule  Commonly known as:  FLOMAX  Take 0.4 mg by mouth daily.        Review of Systems  Constitutional: Negative for fever, chills, activity change, appetite change and fatigue.  HENT: Negative for congestion, hearing loss, rhinorrhea, sinus pressure, sneezing and sore throat.   Eyes: Negative.   Respiratory: Negative for cough, chest tightness, shortness of breath and wheezing.   Gastrointestinal: Negative for nausea, vomiting, abdominal pain, diarrhea, constipation, blood in stool and abdominal distention.  Endocrine: Negative.   Genitourinary: Negative for dysuria, urgency, frequency, hematuria and flank pain.  Musculoskeletal: Positive for back pain, gait problem and neck pain.       Shoulder pain.Uses wheelchair   Skin: Negative.   Neurological: Positive for tremors. Negative for dizziness, seizures and headaches.       Numbness and tingling to hands and  feet     Hematological: Negative.   Psychiatric/Behavioral: Negative.     Immunization History  Administered Date(s) Administered  . PPD Test 10/12/2015, 10/29/2015   Pertinent  Health Maintenance Due  Topic Date Due  . COLONOSCOPY  09/03/2016 (Originally 05/26/1990)  . PNA vac Low Risk Adult (1 of 2 - PCV13) 09/04/2023 (Originally 05/26/2005)  . INFLUENZA VACCINE  04/03/2016   No flowsheet data found. Functional Status Survey:    Filed Vitals:   03/23/16 1208  BP: 138/74  Pulse: 53  Temp: 97.8 F (36.6 C)  TempSrc: Oral  Resp: 17  Height: 5\' 11"  (1.803 m)  Weight: 198 lb 3.2 oz (89.903 kg)  SpO2: 98%   Body mass index is 27.66 kg/(m^2). Physical Exam  Constitutional: He is oriented to person, place, and time. He appears well-developed and well-nourished. No distress.  HENT:  Head: Normocephalic.  Mouth/Throat: Oropharynx is clear and moist. No oropharyngeal exudate.  Eyes: Conjunctivae and EOM are normal. Pupils are equal, round, and reactive to light. Right eye exhibits no discharge. Left eye exhibits no discharge. No scleral icterus.  Neck: Normal range of motion. No JVD present. No thyromegaly present.  Cardiovascular: Normal rate, regular rhythm, normal heart sounds and intact distal pulses.  Exam reveals no gallop and no friction rub.   No murmur heard. Pulmonary/Chest: Effort normal and breath sounds normal. No respiratory distress. He has no wheezes. He has no rales.  Abdominal: Soft. Bowel sounds are normal. He exhibits no distension. There is no tenderness. There is no rebound and no guarding.  Musculoskeletal: Normal range of motion. He exhibits no edema or tenderness.  Abnormal gait, wheelchair bound. Bilateral Lower extremities weakness   Lymphadenopathy:    He has no cervical adenopathy.  Neurological: He is oriented to person, place, and time.  Skin: Skin is warm. No rash noted. No erythema. No pallor.  Psychiatric: He has a normal mood and affect.    Labs  reviewed:  Recent Labs  10/10/15 0849 10/17/15 03/12/16  NA 139 136* 136*  K 4.2 4.6 4.7  CL 106  --   --   CO2 26  --   --   GLUCOSE 105*  --   --   BUN 12 20 16   CREATININE 1.09 1.0 1.1  CALCIUM 9.3  --   --     Recent Labs  10/10/15 0849 10/17/15  AST 31 26  ALT 38 41*  ALKPHOS 45 37  BILITOT 0.8  --   PROT 8.0  --   ALBUMIN 4.2  --     Recent Labs  10/10/15 0849 10/17/15 03/12/16  WBC 11.6* 10.0 8.1  NEUTROABS 8.4*  --   --   HGB 15.4 15.1 15.4  HCT 45.7 44 47  MCV 95.3  --   --   PLT 207 231 235   Lab Results  Component Value Date   TSH 0.50 12/12/2015   Lab Results  Component Value Date   HGBA1C 5.7 03/12/2016   Lab Results  Component Value Date   CHOL 189 03/20/2016   HDL 38 03/20/2016   LDLCALC 126 03/20/2016   TRIG 127 03/20/2016    Significant Diagnostic Results in last 30 days:  No results found.  Assessment/Plan  Chronic lumbar, Neck, shoulder, left leg pain,   Recent attempt to wean off Oxy IR ineffective rates Neck, Shoulder  lower back pain 9/10 on scale keeping him awake at night and affecting ambulation on wheelchair. Extra strength Tylenol ineffective. Will D/C for now.Restart Oxy IR 5 mg Tablet 1-2 Tablets Q 6 HRS PRN  One tablet for  Moderate and two tablets severe pain. With his history of severe multifactorial spinal stenosis will refer to [facility] Neurologist for evaluation.   Tremors  Worsening on right hand affecting ADL's. Will refer to [facility] Neurologist for evaluation.   Neuropathy  Bilateral hand and  lower extremities. Continue on Gabapentin.Refer to [facility] Neurologist in [place] for evaluation.    Abnormality of gait Self propel on wheelchair. Fall and safety precautions.     Family/ staff Communication: Reviewed plan with patient, facility Nurse, Nurse Supervisor.   Labs/tests ordered:  None

## 2016-03-29 DIAGNOSIS — Z79899 Other long term (current) drug therapy: Secondary | ICD-10-CM | POA: Diagnosis not present

## 2016-03-29 LAB — BASIC METABOLIC PANEL
BUN: 15 mg/dL (ref 4–21)
Creatinine: 1.3 mg/dL (ref 0.6–1.3)
Glucose: 102 mg/dL
Potassium: 5 mmol/L (ref 3.4–5.3)
Sodium: 139 mmol/L (ref 137–147)

## 2016-03-29 LAB — HEMOGLOBIN A1C: Hemoglobin A1C: 5.8

## 2016-03-29 LAB — CBC AND DIFFERENTIAL
HEMATOCRIT: 43 % (ref 41–53)
HEMOGLOBIN: 14.4 g/dL (ref 13.5–17.5)
PLATELETS: 220 10*3/uL (ref 150–399)
WBC: 7.5 10*3/mL

## 2016-04-04 DIAGNOSIS — I70203 Unspecified atherosclerosis of native arteries of extremities, bilateral legs: Secondary | ICD-10-CM | POA: Diagnosis not present

## 2016-04-04 DIAGNOSIS — M79674 Pain in right toe(s): Secondary | ICD-10-CM | POA: Diagnosis not present

## 2016-04-04 DIAGNOSIS — M79675 Pain in left toe(s): Secondary | ICD-10-CM | POA: Diagnosis not present

## 2016-04-04 DIAGNOSIS — B351 Tinea unguium: Secondary | ICD-10-CM | POA: Diagnosis not present

## 2016-04-17 ENCOUNTER — Encounter: Payer: Self-pay | Admitting: Family

## 2016-04-17 ENCOUNTER — Other Ambulatory Visit: Payer: Self-pay | Admitting: *Deleted

## 2016-04-17 ENCOUNTER — Non-Acute Institutional Stay (SKILLED_NURSING_FACILITY): Payer: Medicare Other | Admitting: Family

## 2016-04-17 DIAGNOSIS — G8929 Other chronic pain: Secondary | ICD-10-CM | POA: Diagnosis not present

## 2016-04-17 DIAGNOSIS — I119 Hypertensive heart disease without heart failure: Secondary | ICD-10-CM

## 2016-04-17 DIAGNOSIS — M545 Low back pain: Secondary | ICD-10-CM | POA: Diagnosis not present

## 2016-04-17 DIAGNOSIS — G4700 Insomnia, unspecified: Secondary | ICD-10-CM | POA: Diagnosis not present

## 2016-04-17 DIAGNOSIS — I480 Paroxysmal atrial fibrillation: Secondary | ICD-10-CM | POA: Diagnosis not present

## 2016-04-17 DIAGNOSIS — N400 Enlarged prostate without lower urinary tract symptoms: Secondary | ICD-10-CM | POA: Diagnosis not present

## 2016-04-17 NOTE — Progress Notes (Signed)
Patient ID: Joel Wilson., male   DOB: December 15, 1940, 75 y.o.   MRN: [id number]  Location:   [place] Room Number: [place] of Service:  SNF (31) Provider: Charmion Hapke FNP-C   Blanchie Serve, MD  Patient Care Team: Blanchie Serve, MD as PCP - General (Internal Medicine)  Extended Emergency Contact Information Primary Emergency Contact: Levander Campion [place] of [place] Phone: [phone] Mobile Phone: [phone] Relation: Brother Secondary Emergency Contact: Loel Lofty [place] of [place] Phone: [phone] Mobile Phone: [phone] Relation: Son  Code Status:  Full code  Goals of care: Advanced Directive information Advanced Directives 04/17/2016  Does patient have an advance directive? No  Would patient like information on creating an advanced directive? No - patient declined information     Chief Complaint  Patient presents with  . Medical Management of Chronic Issues    Routine Visit    HPI:  Pt is a 75 y.o. male seen today at [facility] for medical management of chronic diseases. He has a medical history of HTN, CAD, Afib, Neuropathy , BPH, chronic lumbar/neck pain among other condition. He is seen in his room today. He denies any acute issues this visit. He states has an appointment with Neurology at the [facility] 05/01/2016 for evaluation of neck and Lumbar pain. He has had no recent fall episodes and no recent hospital visit. Facility staff reports no new concerns.    Past Medical History:  Diagnosis Date  . 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 Coronary artery disease    a. 2003 PCI to Ramus;  b. 11/2012 Cath: LM nl, LAD 30, LCX nl, RI 40 ISR, RCA nl.  . Hereditary and idiopathic peripheral neuropathy   . Hyperlipidemia   . Hypertensive heart disease    a. 08/2013 Echo: EF 70-75%, mild LVH.  Marland Kitchen Insomnia   . Paroxysmal atrial fibrillation (HCC)    a.08/2013-->amio;   b. CHA2DS2VASc = 4-->no longer on coumadin.  . Vitamin D deficiency    Past Surgical History:  Procedure Laterality Date  . CARDIAC CATHETERIZATION  11/13/12   [facility]; EF >55%  . CARDIAC CATHETERIZATION  2003  . JOINT REPLACEMENT     left hip  . NECK SURGERY      Allergies  Allergen Reactions  . Aspirin       Medication List       Accurate as of 04/17/16 11:13 AM. Always use your most recent med list.          acetaminophen 325 MG tablet Commonly known as:  TYLENOL Take 650 mg by mouth every 6 (six) hours as needed for mild pain.   amiodarone 200 MG tablet Commonly known as:  PACERONE Take 1 tablet (200 mg total) by mouth at bedtime.   CALCIUM 600+D 600-400 MG-UNIT tablet Generic drug:  Calcium Carbonate-Vitamin D Take 1 tablet by mouth daily. Reported on 03/23/2016   cetirizine 10 MG tablet Commonly known as:  ZYRTEC Take 10 mg by mouth daily.   chlorhexidine 0.12 % solution Commonly known as:  PERIDEX Use as directed 10 mLs in the mouth or throat 3 (three) times daily. Rinse swish and spit   cholecalciferol 1000 units tablet Commonly known as:  VITAMIN D Take 1,000 Units by mouth daily.   clopidogrel 75 MG tablet Commonly known as:  PLAVIX Take 75 mg by mouth daily.   diltiazem 240 MG 24 hr capsule Commonly  known as:  DILACOR XR Take 240 mg by mouth at bedtime. Hold if SBP < 110 or pulse < 60   finasteride 5 MG tablet Commonly known as:  PROSCAR Take 5 mg by mouth daily.   fluticasone 50 MCG/ACT nasal spray Commonly known as:  FLONASE Place 1 spray into both nostrils daily.   gabapentin 600 MG tablet Commonly known as:  NEURONTIN Take 1,200 mg by mouth 3 (three) times daily.   lisinopril 10 MG tablet Commonly known as:  PRINIVIL,ZESTRIL Take 10 mg by mouth daily.   magnesium hydroxide 400 MG/5ML suspension Commonly known as:  MILK OF MAGNESIA Take 30 mLs by mouth daily as needed for mild constipation or moderate constipation.   Melatonin 3  MG Tabs Take 1 tablet (3 mg total) by mouth at bedtime.   metoprolol tartrate 25 MG tablet Commonly known as:  LOPRESSOR Take 25 mg by mouth 2 (two) times daily. Hold for SBP < 110 or heart rate < 60   nitroGLYCERIN 0.4 MG SL tablet Commonly known as:  NITROSTAT Place 0.4 mg under the tongue every 5 (five) minutes as needed for chest pain.   oxycodone 5 MG capsule Commonly known as:  OXY-IR Take 1-2 mg by mouth every 6 (six) hours as needed for pain (1 tablet by mouth for moderate pain and 2 tablet for severe pain).   senna 8.6 MG tablet Commonly known as:  SENOKOT Take 2 tablets by mouth 2 (two) times daily.   tamsulosin 0.4 MG Caps capsule Commonly known as:  FLOMAX Take 0.4 mg by mouth daily.       Review of Systems  Constitutional: Negative for activity change, appetite change, chills, fatigue and fever.  HENT: Negative for congestion, hearing loss, rhinorrhea, sinus pressure, sneezing and sore throat.   Eyes: Negative.   Respiratory: Negative for cough, chest tightness, shortness of breath and wheezing.   Cardiovascular: Negative for chest pain, palpitations and leg swelling.  Gastrointestinal: Negative for abdominal distention, abdominal pain, blood in stool, constipation, diarrhea, nausea and vomiting.  Endocrine: Negative for cold intolerance, heat intolerance, polydipsia, polyphagia and polyuria.  Genitourinary: Negative for dysuria, flank pain, frequency, hematuria and urgency.  Musculoskeletal: Positive for back pain, gait problem and neck pain. Negative for joint swelling and neck stiffness.       Shoulder pain.Uses wheelchair   Skin: Negative.   Neurological: Positive for tremors. Negative for dizziness, seizures and headaches.       Numbness and tingling to hands and  feet   Hematological: Does not bruise/bleed easily.  Psychiatric/Behavioral: Negative for agitation, confusion and hallucinations. The patient is not nervous/anxious.     Immunization History    Administered Date(s) Administered  . PPD Test 10/12/2015, 10/29/2015   Pertinent  Health Maintenance Due  Topic Date Due  . INFLUENZA VACCINE  04/03/2016  . COLONOSCOPY  09/03/2016 (Originally 05/26/1990)  . PNA vac Low Risk Adult (1 of 2 - PCV13) 09/04/2023 (Originally 05/26/2005)   No flowsheet data found. Functional Status Survey:    Vitals:   04/17/16 1058  BP: (!) 105/42  Pulse: (!) 55  Resp: 20  Temp: 97.8 F (36.6 C)  TempSrc: Oral  SpO2: 100%  Weight: 201 lb 3.2 oz (91.3 kg)  Height: 5\' 11"  (1.803 m)   Body mass index is 28.06 kg/m. Physical Exam  Constitutional: He is oriented to person, place, and time. He appears well-developed and well-nourished. No distress.  Pleasant Elderly  HENT:  Head: Normocephalic.  Mouth/Throat: Oropharynx  is clear and moist. No oropharyngeal exudate.  Several teeth missing   Eyes: Conjunctivae and EOM are normal. Pupils are equal, round, and reactive to light. Right eye exhibits no discharge. Left eye exhibits no discharge. No scleral icterus.  Neck: Normal range of motion. No JVD present. No thyromegaly present.  Cardiovascular: Normal rate, regular rhythm, normal heart sounds and intact distal pulses.  Exam reveals no gallop and no friction rub.   No murmur heard. Pulmonary/Chest: Effort normal and breath sounds normal. No respiratory distress. He has no wheezes. He has no rales.  Abdominal: Soft. Bowel sounds are normal. He exhibits no distension. There is no tenderness. There is no rebound and no guarding.  Musculoskeletal: Normal range of motion. He exhibits no edema or tenderness.  Abnormal gait, wheelchair bound. Bilateral Lower extremities weakness   Lymphadenopathy:    He has no cervical adenopathy.  Neurological: He is oriented to person, place, and time.  Skin: Skin is warm. No rash noted. No erythema. No pallor.  Psychiatric: He has a normal mood and affect.    Labs reviewed:  Recent Labs  10/10/15 0849 10/17/15  03/12/16 03/29/16  NA 139 136* 136* 139  K 4.2 4.6 4.7 5.0  CL 106  --   --   --   CO2 26  --   --   --   GLUCOSE 105*  --   --   --   BUN 12 20 16 15   CREATININE 1.09 1.0 1.1 1.3  CALCIUM 9.3  --   --   --     Recent Labs  10/10/15 0849 10/17/15  AST 31 26  ALT 38 41*  ALKPHOS 45 37  BILITOT 0.8  --   PROT 8.0  --   ALBUMIN 4.2  --     Recent Labs  10/10/15 0849 10/17/15 03/12/16 03/29/16  WBC 11.6* 10.0 8.1 7.5  NEUTROABS 8.4*  --   --   --   HGB 15.4 15.1 15.4 14.4  HCT 45.7 44 47 43  MCV 95.3  --   --   --   PLT 207 231 235 220   Lab Results  Component Value Date   TSH 0.50 12/12/2015   Lab Results  Component Value Date   HGBA1C 5.8 03/29/2016   Lab Results  Component Value Date   CHOL 189 03/20/2016   HDL 38 03/20/2016   LDLCALC 126 03/20/2016   TRIG 127 03/20/2016    Assessment/Plan HTN B/p ranging in 100's/60's-130's/60's episodic 80's/60's noted. HR 50's-70's. Continue Lisinopril 10 mg tablet. Reduce Metoprolol to 12.5 mg Tablet twice daily. Monitor B/P and HR twice daily X 1 week then resume previous orders.   Afib Controlled. Continue on Amiodarone 200 mg Tablet. Reduce metoprolol as above. Continue to monitor.   Chronic Lumbar Pain  Continue current regimen. Follow up with Neurology as scheduled 05/01/2016.   BPH  Stable. Continue on Tamsulosin 0.4 mg Caps and Finasteride 5 mg Tablet.   Insomnia  Melatonin effective.  Family/ staff Communication: Reviewed plan of care with patient and facility Nurse supervisor.   Labs/tests ordered:  None

## 2016-05-02 ENCOUNTER — Ambulatory Visit: Payer: Medicare Other | Admitting: Diagnostic Neuroimaging

## 2016-05-09 ENCOUNTER — Non-Acute Institutional Stay (SKILLED_NURSING_FACILITY): Payer: Medicare Other | Admitting: Family

## 2016-05-09 DIAGNOSIS — G8929 Other chronic pain: Secondary | ICD-10-CM

## 2016-05-09 DIAGNOSIS — M545 Low back pain: Secondary | ICD-10-CM

## 2016-05-09 NOTE — Progress Notes (Signed)
Location:   [facility] Room Number: [place] of Service:  SNF (31) Provider: Dinah Ngetich FNP-C   Blanchie Serve, MD  Patient Care Team: Blanchie Serve, MD as PCP - General (Internal Medicine)  Extended Emergency Contact Information Primary Emergency Contact: Levander Campion [place] Phone: [phone] Mobile Phone: [phone] Relation: Brother Secondary Emergency Contact: Loel Lofty [place] Phone: [phone] Mobile Phone: [phone] Relation: Son  Code Status: Full Code  Goals of care: Advanced Directive information Advanced Directives 04/17/2016  Does patient have an advance directive? No  Would patient like information on creating an advanced directive? No - patient declined information     Chief Complaint  Patient presents with  . Acute Visit    Back Pain     HPI:  Pt is a 75 y.o. male seen today at [facility]  for an acute visit for lower back pain. He is seen in his room today. He was recent seen by Neurology at the [facility] in  [place]. His oxycodone was reduced to 5 mg tablet Q 6 HRS PRN and Extra strength Tylenol added. Patient request oxycodone changed to 1-2 tablets otherwise will figure out how to get "something else" one way or another.Patient advised on use on narcotics and safety. Agreed to continue with current medication.     Past Medical History:  Diagnosis Date  . 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 Coronary artery disease    a. 2003 PCI to Ramus;  b. 11/2012 Cath: LM nl, LAD 30, LCX nl, RI 40 ISR, RCA nl.  . Hereditary and idiopathic peripheral neuropathy   . Hyperlipidemia   . Hypertensive heart disease    a. 08/2013 Echo: EF 70-75%, mild LVH.  Marland Kitchen Insomnia   . Paroxysmal atrial fibrillation (HCC)    a.08/2013-->amio;  b. CHA2DS2VASc = 4-->no longer on coumadin.  . Vitamin D deficiency    Past Surgical History:  Procedure  Laterality Date  . CARDIAC CATHETERIZATION  11/13/12   [facility]; EF >55%  . CARDIAC CATHETERIZATION  2003  . JOINT REPLACEMENT     left hip  . NECK SURGERY      Allergies  Allergen Reactions  . Aspirin       Medication List       Accurate as of 05/09/16  5:45 PM. Always use your most recent med list.          acetaminophen 325 MG tablet Commonly known as:  TYLENOL Take 650 mg by mouth every 6 (six) hours as needed for mild pain.   amiodarone 200 MG tablet Commonly known as:  PACERONE Take 1 tablet (200 mg total) by mouth at bedtime.   CALCIUM 600+D 600-400 MG-UNIT tablet Generic drug:  Calcium Carbonate-Vitamin D Take 1 tablet by mouth daily. Reported on 03/23/2016   cetirizine 10 MG tablet Commonly known as:  ZYRTEC Take 10 mg by mouth daily.   chlorhexidine 0.12 % solution Commonly known as:  PERIDEX Use as directed 10 mLs in the mouth or throat 3 (three) times daily. Rinse swish and spit   cholecalciferol 1000 units tablet Commonly known as:  VITAMIN D Take 1,000 Units by mouth daily.   clopidogrel 75 MG tablet Commonly known as:  PLAVIX Take 75 mg by mouth daily.   diltiazem 240 MG 24 hr capsule Commonly known as:  DILACOR XR Take 240 mg by mouth at bedtime. Hold if  SBP < 110 or pulse < 60   finasteride 5 MG tablet Commonly known as:  PROSCAR Take 5 mg by mouth daily.   fluticasone 50 MCG/ACT nasal spray Commonly known as:  FLONASE Place 1 spray into both nostrils daily.   gabapentin 600 MG tablet Commonly known as:  NEURONTIN Take 1,200 mg by mouth 3 (three) times daily.   lisinopril 10 MG tablet Commonly known as:  PRINIVIL,ZESTRIL Take 10 mg by mouth daily.   magnesium hydroxide 400 MG/5ML suspension Commonly known as:  MILK OF MAGNESIA Take 30 mLs by mouth daily as needed for mild constipation or moderate constipation.   Melatonin 3 MG Tabs Take 1 tablet (3 mg total) by mouth at bedtime.   metoprolol tartrate 25 MG tablet Commonly known  as:  LOPRESSOR Take 25 mg by mouth 2 (two) times daily. Hold for SBP < 110 or heart rate < 60   nitroGLYCERIN 0.4 MG SL tablet Commonly known as:  NITROSTAT Place 0.4 mg under the tongue every 5 (five) minutes as needed for chest pain.   oxycodone 5 MG capsule Commonly known as:  OXY-IR Take 5 mg by mouth every 6 (six) hours as needed for pain (1 tablet by mouth for moderate pain).   senna 8.6 MG tablet Commonly known as:  SENOKOT Take 2 tablets by mouth 2 (two) times daily.   tamsulosin 0.4 MG Caps capsule Commonly known as:  FLOMAX Take 0.4 mg by mouth daily.       Review of Systems  Constitutional: Negative for activity change, appetite change, chills, fatigue and fever.  HENT: Negative for congestion, hearing loss, rhinorrhea, sinus pressure, sneezing and sore throat.   Eyes: Negative.   Respiratory: Negative for cough, chest tightness, shortness of breath and wheezing.   Cardiovascular: Negative for chest pain, palpitations and leg swelling.  Gastrointestinal: Negative for abdominal distention, abdominal pain, blood in stool, constipation, diarrhea, nausea and vomiting.  Endocrine: Negative for cold intolerance, heat intolerance, polydipsia, polyphagia and polyuria.  Genitourinary: Negative for dysuria, flank pain, frequency, hematuria and urgency.  Musculoskeletal: Positive for back pain, gait problem and neck pain. Negative for joint swelling and neck stiffness.       Chronic shoulder pain.Gait unsteady self propel on  wheelchair   Skin: Negative.   Neurological: Positive for tremors. Negative for dizziness, seizures and headaches.       Reports neuropathy to hands and feet   Hematological: Does not bruise/bleed easily.  Psychiatric/Behavioral: Negative for agitation, confusion and hallucinations. The patient is not nervous/anxious.     Immunization History  Administered Date(s) Administered  . PPD Test 10/12/2015, 10/29/2015   Pertinent  Health Maintenance Due    Topic Date Due  . INFLUENZA VACCINE  04/03/2016  . COLONOSCOPY  09/03/2016 (Originally 05/26/1990)  . PNA vac Low Risk Adult (1 of 2 - PCV13) 09/04/2023 (Originally 05/26/2005)      Vitals:   05/09/16 1740  BP: 120/70  Pulse: 72  Resp: 16  Temp: 98.4 F (36.9 C)  SpO2: 96%  Weight: 204 lb 12.8 oz (92.9 kg)  Height: 5\' 11"  (1.803 m)   Body mass index is 28.56 kg/m. Physical Exam  Constitutional: He is oriented to person, place, and time. He appears well-developed and well-nourished. No distress.  HENT:  Head: Normocephalic.  Mouth/Throat: Oropharynx is clear and moist. No oropharyngeal exudate.  Eyes: Conjunctivae and EOM are normal. Pupils are equal, round, and reactive to light. Right eye exhibits no discharge. Left eye exhibits no  discharge. No scleral icterus.  Neck: Normal range of motion. No JVD present. No thyromegaly present.  Cardiovascular: Normal rate, regular rhythm, normal heart sounds and intact distal pulses.  Exam reveals no gallop and no friction rub.   No murmur heard. Pulmonary/Chest: Effort normal and breath sounds normal. No respiratory distress. He has no wheezes. He has no rales.  Abdominal: Soft. Bowel sounds are normal. He exhibits no distension. There is no tenderness. There is no rebound and no guarding.  Musculoskeletal: Normal range of motion. He exhibits no edema or tenderness.  Unsteady gait self propel on wheelchair. Bilateral Lower extremities weakness   Lymphadenopathy:    He has no cervical adenopathy.  Neurological: He is oriented to person, place, and time.  Skin: Skin is warm. No rash noted. No erythema. No pallor.  Psychiatric: He has a normal mood and affect.    Labs reviewed:  Recent Labs  10/10/15 0849 10/17/15 03/12/16 03/29/16  NA 139 136* 136* 139  K 4.2 4.6 4.7 5.0  CL 106  --   --   --   CO2 26  --   --   --   GLUCOSE 105*  --   --   --   BUN 12 20 16 15   CREATININE 1.09 1.0 1.1 1.3  CALCIUM 9.3  --   --   --      Recent Labs  10/10/15 0849 10/17/15  AST 31 26  ALT 38 41*  ALKPHOS 45 37  BILITOT 0.8  --   PROT 8.0  --   ALBUMIN 4.2  --     Recent Labs  10/10/15 0849 10/17/15 03/12/16 03/29/16  WBC 11.6* 10.0 8.1 7.5  NEUTROABS 8.4*  --   --   --   HGB 15.4 15.1 15.4 14.4  HCT 45.7 44 47 43  MCV 95.3  --   --   --   PLT 207 231 235 220   Lab Results  Component Value Date   TSH 0.50 12/12/2015   Lab Results  Component Value Date   HGBA1C 5.8 03/29/2016   Lab Results  Component Value Date   CHOL 189 03/20/2016   HDL 38 03/20/2016   LDLCALC 126 03/20/2016   TRIG 127 03/20/2016    Assessment/Plan   Chronic lumbar pain Evaluated recently by Neuro at the [facility] in [place]. Oxycodone reduce to 5 mg Tablet Q 6 HRS PRN and Extra strength Tylenol added. Patient upset about adjustment of medication.Narcotic use and safety discussed with patient. Will continue current medication. Continue to monitor.      Family/ staff Communication:Reviewed plan of care with patient, facility Nurse and Nurse supervisor.   Labs/tests ordered:  None

## 2016-05-15 DIAGNOSIS — R2681 Unsteadiness on feet: Secondary | ICD-10-CM | POA: Diagnosis not present

## 2016-05-15 DIAGNOSIS — M6281 Muscle weakness (generalized): Secondary | ICD-10-CM | POA: Diagnosis not present

## 2016-05-15 DIAGNOSIS — Z9181 History of falling: Secondary | ICD-10-CM | POA: Diagnosis not present

## 2016-05-16 ENCOUNTER — Encounter: Payer: Self-pay | Admitting: Internal Medicine

## 2016-05-16 ENCOUNTER — Non-Acute Institutional Stay (SKILLED_NURSING_FACILITY): Payer: Medicare Other | Admitting: Internal Medicine

## 2016-05-16 DIAGNOSIS — I482 Chronic atrial fibrillation, unspecified: Secondary | ICD-10-CM

## 2016-05-16 DIAGNOSIS — I119 Hypertensive heart disease without heart failure: Secondary | ICD-10-CM

## 2016-05-16 DIAGNOSIS — G894 Chronic pain syndrome: Secondary | ICD-10-CM | POA: Diagnosis not present

## 2016-05-16 DIAGNOSIS — Z9181 History of falling: Secondary | ICD-10-CM | POA: Diagnosis not present

## 2016-05-16 DIAGNOSIS — N400 Enlarged prostate without lower urinary tract symptoms: Secondary | ICD-10-CM | POA: Diagnosis not present

## 2016-05-16 DIAGNOSIS — G6289 Other specified polyneuropathies: Secondary | ICD-10-CM | POA: Diagnosis not present

## 2016-05-16 DIAGNOSIS — M6281 Muscle weakness (generalized): Secondary | ICD-10-CM | POA: Diagnosis not present

## 2016-05-16 DIAGNOSIS — R2681 Unsteadiness on feet: Secondary | ICD-10-CM | POA: Diagnosis not present

## 2016-05-16 NOTE — Progress Notes (Signed)
Patient ID: Joel Wilson., male   DOB: 08-Jan-1941, 75 y.o.   MRN: [id number]    LOCATION: [name]  PCP: Blanchie Serve, MD   Code Status: Full Code  Goals of care: Advanced Directive information Advanced Directives 04/17/2016  Does patient have an advance directive? No  Would patient like information on creating an advanced directive? No - patient declined information    Extended Emergency Contact Information Primary Emergency Contact: Levander Campion [place] of [place] Phone: [phone] Mobile Phone: [phone] Relation: Brother Secondary Emergency Contact: Loel Lofty [place] Phone: [phone] Mobile Phone: [phone] Relation: Son   Allergies  Allergen Reactions  . Aspirin     Chief Complaint  Patient presents with  . Medical Management of Chronic Issues    Routine Visit     HPI:  Patient is a 75 y.o. male seen today for routine visit. He has been at his baseline. He complaints of pain to his arms, legs and back worsening after reduction of his chronic oxycodone dosing to half by his physician at [facility] hospital. He was on oxyIR 10 mg qid prn before and is now on 5 mg qid prn. He denies any other concern. No new concern from staff.   Review of Systems:  Constitutional: Negative for fever.  HENT: Negative for headache, congestion, nasal discharge Eyes: Negative for blurred vision, double vision and discharge.  Respiratory: Negative for cough, shortness of breath and wheezing.   Cardiovascular: Negative for chest pain, palpitations, leg swelling.  Gastrointestinal: Negative for heartburn, nausea, vomiting, abdominal pain. Had bowel movement this am Genitourinary: Negative for dysuria Musculoskeletal: Negative for fall in the facility. Has chronic pain syndrome.  Skin: Negative for itching, rash.  Neurological: has history of neuropathy Psychiatric/Behavioral: Negative for depression   Past Medical History:    Diagnosis Date  . 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 Coronary artery disease    a. 2003 PCI to Ramus;  b. 11/2012 Cath: LM nl, LAD 30, LCX nl, RI 40 ISR, RCA nl.  . Hereditary and idiopathic peripheral neuropathy   . Hyperlipidemia   . Hypertensive heart disease    a. 08/2013 Echo: EF 70-75%, mild LVH.  Marland Kitchen Insomnia   . Paroxysmal atrial fibrillation (HCC)    a.08/2013-->amio;  b. CHA2DS2VASc = 4-->no longer on coumadin.  . Vitamin D deficiency     Medications:   Medication List       Accurate as of 05/16/16 11:50 AM. Always use your most recent med list.          acetaminophen 325 MG tablet Commonly known as:  TYLENOL Take 650 mg by mouth every 6 (six) hours as needed for mild pain.   acetaminophen 500 MG tablet Commonly known as:  TYLENOL Take 1,000 mg by mouth every 6 (six) hours.   amiodarone 200 MG tablet Commonly known as:  PACERONE Take 1 tablet (200 mg total) by mouth at bedtime.   CALCIUM 600+D 600-400 MG-UNIT tablet Generic drug:  Calcium Carbonate-Vitamin D Take 1 tablet by mouth daily. Reported on 03/23/2016   cetirizine 10 MG tablet Commonly known as:  ZYRTEC Take 10 mg by mouth daily.   chlorhexidine 0.12 % solution Commonly known as:  PERIDEX Use as directed 10 mLs in the mouth or throat 3 (three) times daily. Rinse swish and spit   cholecalciferol 1000 units tablet Commonly known as:  VITAMIN D Take 1,000 Units by mouth daily.   clopidogrel 75 MG  tablet Commonly known as:  PLAVIX Take 75 mg by mouth daily.   diltiazem 240 MG 24 hr capsule Commonly known as:  DILACOR XR Take 240 mg by mouth at bedtime. Hold if SBP < 110 or pulse < 60   finasteride 5 MG tablet Commonly known as:  PROSCAR Take 5 mg by mouth daily.   fluticasone 50 MCG/ACT nasal spray Commonly known as:  FLONASE Place 1 spray into both nostrils daily.   gabapentin 600 MG tablet Commonly known as:  NEURONTIN Take 1,200 mg by mouth 3 (three) times  daily.   lisinopril 10 MG tablet Commonly known as:  PRINIVIL,ZESTRIL Take 10 mg by mouth daily.   magnesium hydroxide 400 MG/5ML suspension Commonly known as:  MILK OF MAGNESIA Take 30 mLs by mouth daily as needed for mild constipation or moderate constipation.   Melatonin 3 MG Tabs Take 1 tablet (3 mg total) by mouth at bedtime.   metoprolol succinate 25 MG 24 hr tablet Commonly known as:  TOPROL-XL Take 12.5 mg by mouth 2 (two) times daily.   nitroGLYCERIN 0.4 MG SL tablet Commonly known as:  NITROSTAT Place 0.4 mg under the tongue every 5 (five) minutes as needed for chest pain.   oxycodone 5 MG capsule Commonly known as:  OXY-IR Take 5 mg by mouth every 6 (six) hours as needed for pain (1 tablet by mouth for moderate pain).   senna 8.6 MG tablet Commonly known as:  SENOKOT Take 2 tablets by mouth 2 (two) times daily.   tamsulosin 0.4 MG Caps capsule Commonly known as:  FLOMAX Take 0.4 mg by mouth daily.       Immunizations: Immunization History  Administered Date(s) Administered  . PPD Test 10/12/2015, 10/29/2015     Physical Exam: Vitals:   05/16/16 1139  BP: (!) 155/69  Pulse: 66  Resp: 19  SpO2: 100%  Weight: 203 lb 12.8 oz (92.4 kg)  Height: 5\' 11"  (1.803 m)   Body mass index is 28.42 kg/m.  General- elderly male, well built, in no acute distress Head- normocephalic, atraumatic Nose-  no nasal discharge Throat- moist mucus membrane, poor dentition  Eyes- PERRLA, EOMI, no pallor, no icterus, no discharge, normal conjunctiva, normal sclera Neck- no cervical lymphadenopathy Cardiovascular- normal s1,s2, no murmurs, palpable dorsalis pedis and radial pulses, no leg edema Respiratory- bilateral clear to auscultation, no wheeze, no rhonchi, no crackles, no use of accessory muscles Abdomen- bowel sounds present, soft, non tender Musculoskeletal- able to move all 4 extremities, lower extremity weakness, limited neck and shoulder ROM Neurological-  alert and oriented to person, place and time Skin- warm and dry Psychiatry- normal mood and affect    Labs reviewed: Basic Metabolic Panel:  Recent Labs  10/10/15 0849 10/17/15 03/12/16 03/29/16  NA 139 136* 136* 139  K 4.2 4.6 4.7 5.0  CL 106  --   --   --   CO2 26  --   --   --   GLUCOSE 105*  --   --   --   BUN 12 20 16 15   CREATININE 1.09 1.0 1.1 1.3  CALCIUM 9.3  --   --   --    Liver Function Tests:  Recent Labs  10/10/15 0849 10/17/15  AST 31 26  ALT 38 41*  ALKPHOS 45 37  BILITOT 0.8  --   PROT 8.0  --   ALBUMIN 4.2  --     Recent Labs  10/10/15 0849  LIPASE 29  No results for input(s): AMMONIA in the last 8760 hours. CBC:  Recent Labs  10/10/15 0849 10/17/15 03/12/16 03/29/16  WBC 11.6* 10.0 8.1 7.5  NEUTROABS 8.4*  --   --   --   HGB 15.4 15.1 15.4 14.4  HCT 45.7 44 47 43  MCV 95.3  --   --   --   PLT 207 231 235 220   IMAGING  10/10/15 MRI CERVICAL SPINE    IMPRESSION: 1. Solid anterior fusion at C4-5 and C6-7 2. Moderate foraminal stenosis remains bilaterally at C4-5 secondary to moderate residual uncovertebral disease. 3. Leftward disc osteophyte complex with severe left central and foraminal stenosis at C5-6. Moderate right foraminal narrowing is present as well. 4. Moderate to severe central and moderate foraminal stenosis bilaterally at C3-4, worse on the right. 5. Mild facet hypertrophy bilaterally at C7-T1 without significant focal protrusion or stenosis.   10/10/15 mri lumbar spine  IMPRESSION: S1 is a transitional vertebra.   L3-4: Shallow bilateral posterior lateral disc protrusions. Narrowing of both lateral recesses that would have some potential to affect the L4 nerve roots.   L4-5: Severe multifactorial spinal stenosis because of endplate osteophytes and circumferential protrusion of disc material with caudal down turning. Facet and ligamentous hypertrophy. Neural compression is likely at this level.   L5-S1:  Shallow broad-based disc protrusion more prominent in the right posterior lateral direction. Stenosis of both subarticular lateral recesses and foramina, right more than left. Neural compression is possible this level, particularly affecting the right L5 and S1 nerve roots.      Assessment/Plan  Chronic pain syndrome Persists.  but medication has been helpful. Currently on tylenol 1000 mg qid with oxyIR 5 mg q6h prn and tylenol 650 mg q6h prn pain. With his pain not under control, change tylenol to 650 mg qid only and oxyIR to 10 mg q8h prn pain x 2 weeks and then 5 mg q6h prn pain and monitor. D/c tylenol 1000 mg order.  Neuropathic pain Continue gabapentin 1200 mg tid  afib Rate controlled. Continue pacerone with diltiazem and metoprolol 12.5 mg bid.  Lab Results  Component Value Date   TSH 0.50 12/12/2015    HTN BP Readings from Last 3 Encounters:  05/16/16 (!) 155/69  05/09/16 120/70  04/17/16 (!) 105/42   Elevated BP, his pain could be contributing to this. continue lopressor 25 mg bid with lisinopril 10 mg daily. Monitor bp  BPH No urinary obstruction. Continue flomax and finasteride.    Blanchie Serve, MD Internal Medicine [facility] [address] [place], [place] [address] Cell Phone (Monday-Friday 8 am - 5 pm): [phone] On Call: [phone] and follow prompts after 5 pm and on weekends Office Phone: [phone] Office Fax: [phone]

## 2016-05-17 DIAGNOSIS — Z9181 History of falling: Secondary | ICD-10-CM | POA: Diagnosis not present

## 2016-05-17 DIAGNOSIS — R2681 Unsteadiness on feet: Secondary | ICD-10-CM | POA: Diagnosis not present

## 2016-05-17 DIAGNOSIS — M6281 Muscle weakness (generalized): Secondary | ICD-10-CM | POA: Diagnosis not present

## 2016-05-18 DIAGNOSIS — R2681 Unsteadiness on feet: Secondary | ICD-10-CM | POA: Diagnosis not present

## 2016-05-18 DIAGNOSIS — M6281 Muscle weakness (generalized): Secondary | ICD-10-CM | POA: Diagnosis not present

## 2016-05-18 DIAGNOSIS — Z9181 History of falling: Secondary | ICD-10-CM | POA: Diagnosis not present

## 2016-05-20 DIAGNOSIS — R2681 Unsteadiness on feet: Secondary | ICD-10-CM | POA: Diagnosis not present

## 2016-05-20 DIAGNOSIS — M6281 Muscle weakness (generalized): Secondary | ICD-10-CM | POA: Diagnosis not present

## 2016-05-20 DIAGNOSIS — Z9181 History of falling: Secondary | ICD-10-CM | POA: Diagnosis not present

## 2016-05-22 DIAGNOSIS — Z7951 Long term (current) use of inhaled steroids: Secondary | ICD-10-CM | POA: Diagnosis not present

## 2016-05-22 DIAGNOSIS — M6281 Muscle weakness (generalized): Secondary | ICD-10-CM | POA: Diagnosis not present

## 2016-05-22 DIAGNOSIS — Z79899 Other long term (current) drug therapy: Secondary | ICD-10-CM | POA: Diagnosis not present

## 2016-05-22 DIAGNOSIS — R2681 Unsteadiness on feet: Secondary | ICD-10-CM | POA: Diagnosis not present

## 2016-05-22 DIAGNOSIS — Z9181 History of falling: Secondary | ICD-10-CM | POA: Diagnosis not present

## 2016-05-22 DIAGNOSIS — H25813 Combined forms of age-related cataract, bilateral: Secondary | ICD-10-CM | POA: Diagnosis not present

## 2016-05-23 DIAGNOSIS — Z9181 History of falling: Secondary | ICD-10-CM | POA: Diagnosis not present

## 2016-05-23 DIAGNOSIS — R2681 Unsteadiness on feet: Secondary | ICD-10-CM | POA: Diagnosis not present

## 2016-05-23 DIAGNOSIS — M6281 Muscle weakness (generalized): Secondary | ICD-10-CM | POA: Diagnosis not present

## 2016-05-24 DIAGNOSIS — Z9181 History of falling: Secondary | ICD-10-CM | POA: Diagnosis not present

## 2016-05-24 DIAGNOSIS — R2681 Unsteadiness on feet: Secondary | ICD-10-CM | POA: Diagnosis not present

## 2016-05-24 DIAGNOSIS — M6281 Muscle weakness (generalized): Secondary | ICD-10-CM | POA: Diagnosis not present

## 2016-05-25 DIAGNOSIS — Z9181 History of falling: Secondary | ICD-10-CM | POA: Diagnosis not present

## 2016-05-25 DIAGNOSIS — R2681 Unsteadiness on feet: Secondary | ICD-10-CM | POA: Diagnosis not present

## 2016-05-25 DIAGNOSIS — M6281 Muscle weakness (generalized): Secondary | ICD-10-CM | POA: Diagnosis not present

## 2016-05-27 DIAGNOSIS — Z9181 History of falling: Secondary | ICD-10-CM | POA: Diagnosis not present

## 2016-05-27 DIAGNOSIS — M6281 Muscle weakness (generalized): Secondary | ICD-10-CM | POA: Diagnosis not present

## 2016-05-27 DIAGNOSIS — R2681 Unsteadiness on feet: Secondary | ICD-10-CM | POA: Diagnosis not present

## 2016-05-29 DIAGNOSIS — Z9181 History of falling: Secondary | ICD-10-CM | POA: Diagnosis not present

## 2016-05-29 DIAGNOSIS — R2681 Unsteadiness on feet: Secondary | ICD-10-CM | POA: Diagnosis not present

## 2016-05-29 DIAGNOSIS — M6281 Muscle weakness (generalized): Secondary | ICD-10-CM | POA: Diagnosis not present

## 2016-05-30 DIAGNOSIS — R2681 Unsteadiness on feet: Secondary | ICD-10-CM | POA: Diagnosis not present

## 2016-05-30 DIAGNOSIS — M6281 Muscle weakness (generalized): Secondary | ICD-10-CM | POA: Diagnosis not present

## 2016-05-30 DIAGNOSIS — Z9181 History of falling: Secondary | ICD-10-CM | POA: Diagnosis not present

## 2016-05-31 DIAGNOSIS — R2681 Unsteadiness on feet: Secondary | ICD-10-CM | POA: Diagnosis not present

## 2016-05-31 DIAGNOSIS — Z9181 History of falling: Secondary | ICD-10-CM | POA: Diagnosis not present

## 2016-05-31 DIAGNOSIS — M6281 Muscle weakness (generalized): Secondary | ICD-10-CM | POA: Diagnosis not present

## 2016-06-03 DIAGNOSIS — Z9181 History of falling: Secondary | ICD-10-CM | POA: Diagnosis not present

## 2016-06-03 DIAGNOSIS — R2681 Unsteadiness on feet: Secondary | ICD-10-CM | POA: Diagnosis not present

## 2016-06-03 DIAGNOSIS — M6281 Muscle weakness (generalized): Secondary | ICD-10-CM | POA: Diagnosis not present

## 2016-06-04 DIAGNOSIS — M6281 Muscle weakness (generalized): Secondary | ICD-10-CM | POA: Diagnosis not present

## 2016-06-04 DIAGNOSIS — Z9181 History of falling: Secondary | ICD-10-CM | POA: Diagnosis not present

## 2016-06-04 DIAGNOSIS — R2681 Unsteadiness on feet: Secondary | ICD-10-CM | POA: Diagnosis not present

## 2016-06-05 DIAGNOSIS — M6281 Muscle weakness (generalized): Secondary | ICD-10-CM | POA: Diagnosis not present

## 2016-06-05 DIAGNOSIS — R2681 Unsteadiness on feet: Secondary | ICD-10-CM | POA: Diagnosis not present

## 2016-06-05 DIAGNOSIS — Z9181 History of falling: Secondary | ICD-10-CM | POA: Diagnosis not present

## 2016-06-06 DIAGNOSIS — M6281 Muscle weakness (generalized): Secondary | ICD-10-CM | POA: Diagnosis not present

## 2016-06-06 DIAGNOSIS — R2681 Unsteadiness on feet: Secondary | ICD-10-CM | POA: Diagnosis not present

## 2016-06-06 DIAGNOSIS — Z9181 History of falling: Secondary | ICD-10-CM | POA: Diagnosis not present

## 2016-06-07 DIAGNOSIS — Z9181 History of falling: Secondary | ICD-10-CM | POA: Diagnosis not present

## 2016-06-07 DIAGNOSIS — M6281 Muscle weakness (generalized): Secondary | ICD-10-CM | POA: Diagnosis not present

## 2016-06-07 DIAGNOSIS — R2681 Unsteadiness on feet: Secondary | ICD-10-CM | POA: Diagnosis not present

## 2016-06-11 ENCOUNTER — Non-Acute Institutional Stay (SKILLED_NURSING_FACILITY): Payer: Medicare Other | Admitting: Family

## 2016-06-11 DIAGNOSIS — I482 Chronic atrial fibrillation, unspecified: Secondary | ICD-10-CM

## 2016-06-11 DIAGNOSIS — L853 Xerosis cutis: Secondary | ICD-10-CM

## 2016-06-11 DIAGNOSIS — G609 Hereditary and idiopathic neuropathy, unspecified: Secondary | ICD-10-CM | POA: Diagnosis not present

## 2016-06-11 DIAGNOSIS — I2510 Atherosclerotic heart disease of native coronary artery without angina pectoris: Secondary | ICD-10-CM

## 2016-06-11 DIAGNOSIS — N400 Enlarged prostate without lower urinary tract symptoms: Secondary | ICD-10-CM | POA: Diagnosis not present

## 2016-06-11 DIAGNOSIS — I119 Hypertensive heart disease without heart failure: Secondary | ICD-10-CM

## 2016-06-11 NOTE — Progress Notes (Addendum)
Location:  [place] Room Number: [place] of Service:  SNF (31) Provider: Dinah Ngetich FNP-C   Blanchie Serve, MD  Patient Care Team: Blanchie Serve, MD as PCP - General (Internal Medicine)  Extended Emergency Contact Information Primary Emergency Contact: Levander Campion [place] of [place] Phone: [phone] Mobile Phone: [phone] Relation: Brother Secondary Emergency Contact: Loel Lofty [place] of [place] Phone: [phone] Mobile Phone: [phone] Relation: Son  Code Status: Full Code  Goals of care: Advanced Directive information Advanced Directives 04/17/2016  Does patient have an advance directive? No  Would patient like information on creating an advanced directive? No - patient declined information     Chief Complaint  Patient presents with  . Medical Management of Chronic Issues    HPI:  Pt is a 75 y.o. male seen today at [facility]  for medical management of chronic diseases.He has a medical history of HTN,Afib, CAD, Neuropathy, BPH among other conditions. He is seen in his room today.He continues to exercise with restorative therapy states with assist without a walker just used a gait belt today. He still complains of chronic back, neck and shoulder pain. No recent fall episodes or hospital admission.    Past Medical History:  Diagnosis Date  . 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 Coronary artery disease    a. 2003 PCI to Ramus;  b. 11/2012 Cath: LM nl, LAD 30, LCX nl, RI 40 ISR, RCA nl.  . Hereditary and idiopathic peripheral neuropathy   . Hyperlipidemia   . Hypertensive heart disease    a. 08/2013 Echo: EF 70-75%, mild LVH.  Marland Kitchen Insomnia   . Paroxysmal atrial fibrillation (HCC)    a.08/2013-->amio;  b. CHA2DS2VASc = 4-->no longer on coumadin.  . Vitamin D deficiency    Past Surgical History:  Procedure Laterality Date  . CARDIAC CATHETERIZATION   11/13/12   [facility]; EF >55%  . CARDIAC CATHETERIZATION  2003  . JOINT REPLACEMENT     left hip  . NECK SURGERY      Allergies  Allergen Reactions  . Aspirin       Medication List       Accurate as of 06/11/16  6:12 PM. Always use your most recent med list.          acetaminophen 325 MG tablet Commonly known as:  TYLENOL Take 650 mg by mouth every 6 (six) hours as needed for mild pain.   acetaminophen 500 MG tablet Commonly known as:  TYLENOL Take 1,000 mg by mouth every 6 (six) hours.   amiodarone 200 MG tablet Commonly known as:  PACERONE Take 1 tablet (200 mg total) by mouth at bedtime.   CALCIUM 600+D 600-400 MG-UNIT tablet Generic drug:  Calcium Carbonate-Vitamin D Take 1 tablet by mouth daily. Reported on 03/23/2016   cetirizine 10 MG tablet Commonly known as:  ZYRTEC Take 10 mg by mouth daily.   chlorhexidine 0.12 % solution Commonly known as:  PERIDEX Use as directed 10 mLs in the mouth or throat 3 (three) times daily. Rinse swish and spit   cholecalciferol 1000 units tablet Commonly known as:  VITAMIN D Take 1,000 Units by mouth daily.   clopidogrel 75 MG tablet Commonly known as:  PLAVIX Take 75 mg by mouth daily.   diltiazem 240 MG 24 hr capsule Commonly known as:  DILACOR XR Take 240 mg by mouth at bedtime. Hold if SBP < 110  or pulse < 60   finasteride 5 MG tablet Commonly known as:  PROSCAR Take 5 mg by mouth daily.   fluticasone 50 MCG/ACT nasal spray Commonly known as:  FLONASE Place 1 spray into both nostrils daily.   gabapentin 600 MG tablet Commonly known as:  NEURONTIN Take 1,200 mg by mouth 3 (three) times daily.   lisinopril 10 MG tablet Commonly known as:  PRINIVIL,ZESTRIL Take 10 mg by mouth daily.   magnesium hydroxide 400 MG/5ML suspension Commonly known as:  MILK OF MAGNESIA Take 30 mLs by mouth daily as needed for mild constipation or moderate constipation.   Melatonin 3 MG Tabs Take 1 tablet (3 mg total) by mouth at  bedtime.   metoprolol succinate 25 MG 24 hr tablet Commonly known as:  TOPROL-XL Take 12.5 mg by mouth 2 (two) times daily.   nitroGLYCERIN 0.4 MG SL tablet Commonly known as:  NITROSTAT Place 0.4 mg under the tongue every 5 (five) minutes as needed for chest pain.   oxycodone 5 MG capsule Commonly known as:  OXY-IR Take 5 mg by mouth every 6 (six) hours as needed for pain (1 tablet by mouth for moderate pain).   senna 8.6 MG tablet Commonly known as:  SENOKOT Take 2 tablets by mouth 2 (two) times daily.   tamsulosin 0.4 MG Caps capsule Commonly known as:  FLOMAX Take 0.4 mg by mouth daily.       Review of Systems  Constitutional: Negative for activity change, appetite change, chills, fatigue and fever.  HENT: Negative for congestion, hearing loss, rhinorrhea, sinus pressure, sneezing and sore throat.   Eyes: Negative.   Respiratory: Negative for cough, chest tightness, shortness of breath and wheezing.   Cardiovascular: Negative for chest pain, palpitations and leg swelling.  Gastrointestinal: Negative for abdominal distention, abdominal pain, blood in stool, constipation, diarrhea, nausea and vomiting.  Endocrine: Negative for cold intolerance, heat intolerance, polydipsia, polyphagia and polyuria.  Genitourinary: Negative for dysuria, flank pain, frequency, hematuria and urgency.  Musculoskeletal: Positive for gait problem. Negative for joint swelling and neck stiffness.       Chronic Neck,shoulder and Back pain  Skin: Negative.   Neurological: Positive for tremors. Negative for dizziness, seizures and headaches.       Reports neuropathy to hands and feet   Hematological: Does not bruise/bleed easily.  Psychiatric/Behavioral: Negative for agitation, confusion, hallucinations and sleep disturbance. The patient is not nervous/anxious.     Immunization History  Administered Date(s) Administered  . PPD Test 10/12/2015, 10/29/2015   Pertinent  Health Maintenance Due    Topic Date Due  . INFLUENZA VACCINE  09/03/2017 (Originally 04/03/2016)  . PNA vac Low Risk Adult (1 of 2 - PCV13) 09/04/2023 (Originally 05/26/2005)      Vitals:   06/11/16 1100  BP: 118/76  Pulse: 70  Resp: 18  Temp: 98 F (36.7 C)  SpO2: 98%  Weight: 209 lb (94.8 kg)  Height: 5\' 11"  (1.803 m)   Body mass index is 29.15 kg/m. Physical Exam  Constitutional: He is oriented to person, place, and time. He appears well-developed and well-nourished. No distress.  HENT:  Head: Normocephalic.  Mouth/Throat: Oropharynx is clear and moist. No oropharyngeal exudate.  Eyes: Conjunctivae and EOM are normal. Pupils are equal, round, and reactive to light. Right eye exhibits no discharge. Left eye exhibits no discharge. No scleral icterus.  Neck: Normal range of motion. No JVD present. No thyromegaly present.  Cardiovascular: Normal rate, regular rhythm, normal heart sounds and  intact distal pulses.  Exam reveals no gallop and no friction rub.   No murmur heard. Pulmonary/Chest: Effort normal and breath sounds normal. No respiratory distress. He has no wheezes. He has no rales.  Abdominal: Soft. Bowel sounds are normal. He exhibits no distension. There is no tenderness. There is no rebound and no guarding.  Musculoskeletal: Normal range of motion. He exhibits no edema or tenderness.  Unsteady gait self propel on wheelchair. Bilateral Lower extremities weakness   Lymphadenopathy:    He has no cervical adenopathy.  Neurological: He is oriented to person, place, and time.  Skin: Skin is warm. No rash noted. No erythema. No pallor.  Bilateral lower extremities flaky dry  skin.   Psychiatric: He has a normal mood and affect.    Labs reviewed:  Recent Labs  10/10/15 0849 10/17/15 03/12/16 03/29/16  NA 139 136* 136* 139  K 4.2 4.6 4.7 5.0  CL 106  --   --   --   CO2 26  --   --   --   GLUCOSE 105*  --   --   --   BUN 12 20 16 15   CREATININE 1.09 1.0 1.1 1.3  CALCIUM 9.3  --   --   --      Recent Labs  10/10/15 0849 10/17/15  AST 31 26  ALT 38 41*  ALKPHOS 45 37  BILITOT 0.8  --   PROT 8.0  --   ALBUMIN 4.2  --     Recent Labs  10/10/15 0849 10/17/15 03/12/16 03/29/16  WBC 11.6* 10.0 8.1 7.5  NEUTROABS 8.4*  --   --   --   HGB 15.4 15.1 15.4 14.4  HCT 45.7 44 47 43  MCV 95.3  --   --   --   PLT 207 231 235 220   Lab Results  Component Value Date   TSH 0.50 12/12/2015   Lab Results  Component Value Date   HGBA1C 5.8 03/29/2016   Lab Results  Component Value Date   CHOL 189 03/20/2016   HDL 38 03/20/2016   LDLCALC 126 03/20/2016   TRIG 127 03/20/2016    Assessment/Plan 1. Hypertensive heart disease without heart failure B/p stable. Continue to monitor BMP.   2. Chronic atrial fibrillation (HCC) Rate controlled. Continue on Metoprolol.    3. Coronary artery disease involving native coronary artery of native heart without angina pectoris Chest pain free. Continue on Plavix.   4. Idiopathic peripheral neuropathy Continue on Neurontin.continue to control high risk factors.   5. BPH without obstruction/lower urinary tract symptoms Continue on Proscar and Flomax.   6. Dry skin   Bilateral lower extremities skin dryness. Apply Aquaphor Ointment twice daily X 8 weeks.    Family/ staff Communication: Reviewed plan of care with patient and facility Nurse supervisor.   Labs/tests ordered: None

## 2016-06-25 ENCOUNTER — Other Ambulatory Visit: Payer: Self-pay | Admitting: *Deleted

## 2016-06-25 MED ORDER — OXYCODONE HCL 5 MG PO CAPS: ORAL_CAPSULE | ORAL | 0 refills | Status: DC

## 2016-06-25 NOTE — Telephone Encounter (Signed)
[facility] [phone] Fax: [phone]  

## 2016-06-29 DIAGNOSIS — Z79899 Other long term (current) drug therapy: Secondary | ICD-10-CM | POA: Diagnosis not present

## 2016-07-11 ENCOUNTER — Non-Acute Institutional Stay (SKILLED_NURSING_FACILITY): Payer: Medicare Other | Admitting: Family

## 2016-07-11 DIAGNOSIS — N400 Enlarged prostate without lower urinary tract symptoms: Secondary | ICD-10-CM | POA: Diagnosis not present

## 2016-07-11 DIAGNOSIS — G609 Hereditary and idiopathic neuropathy, unspecified: Secondary | ICD-10-CM | POA: Diagnosis not present

## 2016-07-11 DIAGNOSIS — I482 Chronic atrial fibrillation, unspecified: Secondary | ICD-10-CM

## 2016-07-11 DIAGNOSIS — G894 Chronic pain syndrome: Secondary | ICD-10-CM

## 2016-07-11 DIAGNOSIS — I119 Hypertensive heart disease without heart failure: Secondary | ICD-10-CM | POA: Diagnosis not present

## 2016-07-11 NOTE — Progress Notes (Signed)
Location:  [place] Room Number: 009 Place of Service:  SNF (31) Provider: Spirit Wernli FNP-C   Blanchie Serve, MD  Patient Care Team: Blanchie Serve, MD as PCP - General (Internal Medicine)  Extended Emergency Contact Information Primary Emergency Contact: Levander Campion [place] of [place] Phone: [phone] Mobile Phone: [phone] Relation: Brother Secondary Emergency Contact: Loel Lofty [place] of [place] Phone: [phone] Mobile Phone: [phone] Relation: Son  Code Status:  Full Code  Goals of care: Advanced Directive information Advanced Directives 04/17/2016  Does patient have an advance directive? No  Would patient like information on creating an advanced directive? No - patient declined information     Chief Complaint  Patient presents with  . Medical Management of Chronic Issues    HPI:  Pt is a 75 y.o. male seen today at [facility] for medical management of chronic diseases. He has a medical history of HTN, AFib, CAD, BPH, hyperlipidemia, chronic pain among other conditions. He is seen in his room resting on his recliner watching TV. He states still has alots of pain on neck, lower back and legs. He is currently on oxycodone 5 mg Tablet every 6 hrs previously weaned off by Neuro and MD at the facility. Also has scheduled Tylenol for pain. He states has taken two oxycodone for very long time. Facility staff reports no new concerns. No recent fall episodes or hospital admission.     Past Medical History:  Diagnosis Date  . 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 Coronary artery disease    a. 2003 PCI to Ramus;  b. 11/2012 Cath: LM nl, LAD 30, LCX nl, RI 40 ISR, RCA nl.  . Hereditary and idiopathic peripheral neuropathy   . Hyperlipidemia   . Hypertensive heart disease    a. 08/2013 Echo: EF 70-75%, mild LVH.  Marland Kitchen Insomnia   . Paroxysmal atrial fibrillation (HCC)      a.08/2013-->amio;  b. CHA2DS2VASc = 4-->no longer on coumadin.  . Vitamin D deficiency    Past Surgical History:  Procedure Laterality Date  . CARDIAC CATHETERIZATION  11/13/12   [facility]; EF >55%  . CARDIAC CATHETERIZATION  2003  . JOINT REPLACEMENT     left hip  . NECK SURGERY      Allergies  Allergen Reactions  . Aspirin       Medication List       Accurate as of 07/11/16  6:06 PM. Always use your most recent med list.          acetaminophen 500 MG tablet Commonly known as:  TYLENOL Take 1,000 mg by mouth every 6 (six) hours.   amiodarone 200 MG tablet Commonly known as:  PACERONE Take 1 tablet (200 mg total) by mouth at bedtime.   CALCIUM 600+D 600-400 MG-UNIT tablet Generic drug:  Calcium Carbonate-Vitamin D Take 1 tablet by mouth daily. Reported on 03/23/2016   cetirizine 10 MG tablet Commonly known as:  ZYRTEC Take 10 mg by mouth daily.   chlorhexidine 0.12 % solution Commonly known as:  PERIDEX Use as directed 10 mLs in the mouth or throat 3 (three) times daily. Rinse swish and spit   cholecalciferol 1000 units tablet Commonly known as:  VITAMIN D Take 1,000 Units by mouth daily.   clopidogrel 75 MG tablet Commonly known as:  PLAVIX Take 75 mg by mouth daily.   diltiazem 240 MG 24 hr capsule Commonly known as:  DILACOR XR  Take 240 mg by mouth at bedtime. Hold if SBP < 110 or pulse < 60   finasteride 5 MG tablet Commonly known as:  PROSCAR Take 5 mg by mouth daily.   fluticasone 50 MCG/ACT nasal spray Commonly known as:  FLONASE Place 1 spray into both nostrils daily.   gabapentin 600 MG tablet Commonly known as:  NEURONTIN Take 1,200 mg by mouth 3 (three) times daily.   lisinopril 10 MG tablet Commonly known as:  PRINIVIL,ZESTRIL Take 10 mg by mouth daily.   magnesium hydroxide 400 MG/5ML suspension Commonly known as:  MILK OF MAGNESIA Take 30 mLs by mouth daily as needed for mild constipation or moderate constipation.   Melatonin 3  MG Tabs Take 1 tablet (3 mg total) by mouth at bedtime.   metoprolol succinate 25 MG 24 hr tablet Commonly known as:  TOPROL-XL Take 12.5 mg by mouth 2 (two) times daily.   nitroGLYCERIN 0.4 MG SL tablet Commonly known as:  NITROSTAT Place 0.4 mg under the tongue every 5 (five) minutes as needed for chest pain.   oxycodone 5 MG capsule Commonly known as:  OXY-IR Take one tablet by mouth every 6 hours as needed for pain   senna 8.6 MG tablet Commonly known as:  SENOKOT Take 2 tablets by mouth 2 (two) times daily.   tamsulosin 0.4 MG Caps capsule Commonly known as:  FLOMAX Take 0.4 mg by mouth daily.       Review of Systems  Constitutional: Negative for activity change, appetite change, chills, fatigue and fever.  HENT: Negative for congestion, hearing loss, rhinorrhea, sinus pressure, sneezing and sore throat.   Eyes: Negative.   Respiratory: Negative for cough, chest tightness, shortness of breath and wheezing.   Cardiovascular: Negative for chest pain, palpitations and leg swelling.  Gastrointestinal: Negative for abdominal distention, abdominal pain, blood in stool, constipation, diarrhea, nausea and vomiting.  Endocrine: Negative for cold intolerance, heat intolerance, polydipsia, polyphagia and polyuria.  Genitourinary: Negative for dysuria, flank pain, frequency, hematuria and urgency.  Musculoskeletal: Positive for back pain, gait problem and neck pain. Negative for joint swelling and neck stiffness.       Chronic shoulder pain  Skin: Negative.   Neurological: Positive for tremors. Negative for dizziness, seizures and headaches.       Reports neuropathy to hands and feet   Hematological: Does not bruise/bleed easily.  Psychiatric/Behavioral: Negative for agitation, confusion and hallucinations. The patient is not nervous/anxious.     Immunization History  Administered Date(s) Administered  . PPD Test 10/12/2015, 10/29/2015   Pertinent  Health Maintenance Due   Topic Date Due  . INFLUENZA VACCINE  09/03/2017 (Originally 04/03/2016)  . PNA vac Low Risk Adult (1 of 2 - PCV13) 09/04/2023 (Originally 05/26/2005)      Vitals:   07/11/16 1145  BP: 130/74  Pulse: 61  Resp: 20  Temp: 97.6 F (36.4 C)  SpO2: 98%  Weight: 208 lb 12.8 oz (94.7 kg)  Height: 5\' 11"  (1.803 m)   Body mass index is 29.12 kg/m. Physical Exam  Constitutional: He is oriented to person, place, and time. He appears well-developed and well-nourished. No distress.  HENT:  Head: Normocephalic.  Mouth/Throat: Oropharynx is clear and moist. No oropharyngeal exudate.  Eyes: Conjunctivae and EOM are normal. Pupils are equal, round, and reactive to light. Right eye exhibits no discharge. Left eye exhibits no discharge. No scleral icterus.  Neck: Normal range of motion. No JVD present. No thyromegaly present.  Cardiovascular: Normal rate,  regular rhythm, normal heart sounds and intact distal pulses.  Exam reveals no gallop and no friction rub.   No murmur heard. Pulmonary/Chest: Effort normal and breath sounds normal. No respiratory distress. He has no wheezes. He has no rales.  Abdominal: Soft. Bowel sounds are normal. He exhibits no distension. There is no tenderness. There is no rebound and no guarding.  Musculoskeletal: Normal range of motion. He exhibits no edema or tenderness.   self propel on wheelchair due to unsteady gait.   Lymphadenopathy:    He has no cervical adenopathy.  Neurological: He is oriented to person, place, and time.  Skin: Skin is warm. No rash noted. No erythema. No pallor.  Psychiatric: He has a normal mood and affect.    Labs reviewed:  Recent Labs  10/10/15 0849 10/17/15 03/12/16 03/29/16  NA 139 136* 136* 139  K 4.2 4.6 4.7 5.0  CL 106  --   --   --   CO2 26  --   --   --   GLUCOSE 105*  --   --   --   BUN 12 20 16 15   CREATININE 1.09 1.0 1.1 1.3  CALCIUM 9.3  --   --   --     Recent Labs  10/10/15 0849 10/17/15  AST 31 26  ALT 38  41*  ALKPHOS 45 37  BILITOT 0.8  --   PROT 8.0  --   ALBUMIN 4.2  --     Recent Labs  10/10/15 0849 10/17/15 03/12/16 03/29/16  WBC 11.6* 10.0 8.1 7.5  NEUTROABS 8.4*  --   --   --   HGB 15.4 15.1 15.4 14.4  HCT 45.7 44 47 43  MCV 95.3  --   --   --   PLT 207 231 235 220   Lab Results  Component Value Date   TSH 0.50 12/12/2015   Lab Results  Component Value Date   HGBA1C 5.8 03/29/2016   Lab Results  Component Value Date   CHOL 189 03/20/2016   HDL 38 03/20/2016   LDLCALC 126 03/20/2016   TRIG 127 03/20/2016   Assessment/Plan 1. Chronic atrial fibrillation (HCC) HR controlled. Continue on metoprolol  2. Hypertensive heart disease without heart failure B/p stable. Continue on Lisinopril, metoprolol. Monitor BMP.   3. BPH without obstruction/lower urinary tract symptoms Continue on Flomax and Proscar  4. Idiopathic peripheral neuropathy Continue on Gabapentin.   5. Chronic pain syndrome Continue on current regimen. Difficult to wean off pain med.     Family/ staff Communication: Reviewed plan of care with patient and facility Nurse supervisor.   Labs/tests ordered: None

## 2016-08-03 ENCOUNTER — Other Ambulatory Visit: Payer: Self-pay | Admitting: *Deleted

## 2016-08-03 ENCOUNTER — Other Ambulatory Visit: Payer: Self-pay

## 2016-08-03 DIAGNOSIS — G894 Chronic pain syndrome: Secondary | ICD-10-CM | POA: Diagnosis not present

## 2016-08-03 MED ORDER — OXYCODONE HCL 5 MG PO CAPS: ORAL_CAPSULE | ORAL | 0 refills | Status: DC

## 2016-08-06 ENCOUNTER — Other Ambulatory Visit: Payer: Self-pay | Admitting: *Deleted

## 2016-08-06 MED ORDER — OXYCODONE HCL 5 MG PO CAPS: ORAL_CAPSULE | ORAL | 0 refills | Status: DC

## 2016-08-06 NOTE — Telephone Encounter (Signed)
[facility] [phone] Fax: [phone]  

## 2016-08-09 ENCOUNTER — Non-Acute Institutional Stay (SKILLED_NURSING_FACILITY): Payer: Medicare Other | Admitting: Family

## 2016-08-09 DIAGNOSIS — G629 Polyneuropathy, unspecified: Secondary | ICD-10-CM | POA: Diagnosis not present

## 2016-08-09 DIAGNOSIS — E782 Mixed hyperlipidemia: Secondary | ICD-10-CM | POA: Diagnosis not present

## 2016-08-09 DIAGNOSIS — G894 Chronic pain syndrome: Secondary | ICD-10-CM

## 2016-08-09 DIAGNOSIS — N400 Enlarged prostate without lower urinary tract symptoms: Secondary | ICD-10-CM | POA: Diagnosis not present

## 2016-08-09 DIAGNOSIS — I482 Chronic atrial fibrillation, unspecified: Secondary | ICD-10-CM

## 2016-08-09 DIAGNOSIS — I119 Hypertensive heart disease without heart failure: Secondary | ICD-10-CM | POA: Diagnosis not present

## 2016-08-09 NOTE — Progress Notes (Signed)
Location:  [place] Room Number: [place] of Service:  SNF (31) Provider:  Dinah Ngetich FNP-C   Blanchie Serve, MD  Patient Care Team: Blanchie Serve, MD as PCP - General (Internal Medicine)  Extended Emergency Contact Information Primary Emergency Contact: Levander Campion [place]  Phone: [phone] Mobile Phone: [phone] Relation: Brother Secondary Emergency Contact: Loel Lofty [place] of [place] Phone: [phone] Mobile Phone: [phone] Relation: Son  Code Status:  Full Code  Goals of care: Advanced Directive information Advanced Directives 04/17/2016  Does Patient Have a Medical Advance Directive? No  Would patient like information on creating a medical advance directive? No - patient declined information     Chief Complaint  Patient presents with  . Medical Management of Chronic Issues    HPI:  Pt is a 75 y.o. male seen today at [facility] for medical management of chronic diseases. He has a medical history of  HTN, Afib, Neuropathy, Hyperlipidemia, BPH, Neuropathy, chronic pain among other conditions. He is seen in his room today. He denies any acute issues this visit. He was recent seen by pain management specialist  Oxycodone increased to 7.5 mg Tablet every 6 HRS and Lidoderm patch ordered. He states pain under control with current medication. He request Lidoderm patch to be discontinued states "Got sick and broke out in a rash whenever patch is applied".    Past Medical History:  Diagnosis Date  . 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 Coronary artery disease    a. 2003 PCI to Ramus;  b. 11/2012 Cath: LM nl, LAD 30, LCX nl, RI 40 ISR, RCA nl.  . Hereditary and idiopathic peripheral neuropathy   . Hyperlipidemia   . Hypertensive heart disease    a. 08/2013 Echo: EF 70-75%, mild LVH.  Marland Kitchen Insomnia   . Paroxysmal atrial fibrillation (HCC)    a.08/2013-->amio;  b. CHA2DS2VASc = 4-->no longer on coumadin.  . Vitamin D deficiency    Past Surgical History:  Procedure Laterality Date  . CARDIAC CATHETERIZATION  11/13/12   [facility]; EF >55%  . CARDIAC CATHETERIZATION  2003  . JOINT REPLACEMENT     left hip  . NECK SURGERY      Allergies  Allergen Reactions  . Aspirin       Medication List       Accurate as of 08/09/16  4:58 PM. Always use your most recent med list.          acetaminophen 500 MG tablet Commonly known as:  TYLENOL Take 1,000 mg by mouth every 6 (six) hours.   amiodarone 200 MG tablet Commonly known as:  PACERONE Take 1 tablet (200 mg total) by mouth at bedtime.   CALCIUM 600+D 600-400 MG-UNIT tablet Generic drug:  Calcium Carbonate-Vitamin D Take 1 tablet by mouth daily. Reported on 03/23/2016   cetirizine 10 MG tablet Commonly known as:  ZYRTEC Take 10 mg by mouth daily.   chlorhexidine 0.12 % solution Commonly known as:  PERIDEX Use as directed 10 mLs in the mouth or throat 3 (three) times daily. Rinse swish and spit   cholecalciferol 1000 units tablet Commonly known as:  VITAMIN D Take 1,000 Units by mouth daily.   clopidogrel 75 MG tablet Commonly known as:  PLAVIX Take 75 mg by mouth daily.   diltiazem 240 MG 24 hr capsule Commonly known as:  DILACOR XR Take 240 mg by mouth at bedtime. Hold  if SBP < 110 or pulse < 60   finasteride 5 MG tablet Commonly known as:  PROSCAR Take 5 mg by mouth daily.   fluticasone 50 MCG/ACT nasal spray Commonly known as:  FLONASE Place 1 spray into both nostrils daily.   gabapentin 600 MG tablet Commonly known as:  NEURONTIN Take 1,200 mg by mouth 3 (three) times daily.   lisinopril 10 MG tablet Commonly known as:  PRINIVIL,ZESTRIL Take 10 mg by mouth daily.   magnesium hydroxide 400 MG/5ML suspension Commonly known as:  MILK OF MAGNESIA Take 30 mLs by mouth daily as needed for mild constipation or moderate constipation.   Melatonin 3 MG  Tabs Take 1 tablet (3 mg total) by mouth at bedtime.   metoprolol succinate 25 MG 24 hr tablet Commonly known as:  TOPROL-XL Take 12.5 mg by mouth 2 (two) times daily.   nitroGLYCERIN 0.4 MG SL tablet Commonly known as:  NITROSTAT Place 0.4 mg under the tongue every 5 (five) minutes as needed for chest pain.   oxycodone 5 MG capsule Commonly known as:  OXY-IR Take one and 1/2 tablet (7.5mg ) by mouth every 6 hours as needed for pain   senna 8.6 MG tablet Commonly known as:  SENOKOT Take 2 tablets by mouth 2 (two) times daily.   tamsulosin 0.4 MG Caps capsule Commonly known as:  FLOMAX Take 0.4 mg by mouth daily.       Review of Systems  Constitutional: Negative for activity change, appetite change, chills, fatigue and fever.  HENT: Negative for congestion, hearing loss, rhinorrhea, sinus pressure, sneezing and sore throat.   Eyes: Negative.   Respiratory: Negative for cough, chest tightness, shortness of breath and wheezing.   Cardiovascular: Negative for chest pain, palpitations and leg swelling.  Gastrointestinal: Negative for abdominal distention, abdominal pain, blood in stool, constipation, diarrhea, nausea and vomiting.  Endocrine: Negative for cold intolerance, heat intolerance, polydipsia, polyphagia and polyuria.  Genitourinary: Negative for dysuria, flank pain, frequency, hematuria and urgency.  Musculoskeletal: Negative for joint swelling and neck stiffness.       Chronic shoulder pain, back pain and neck pain under control with current pain regimen.    Skin: Negative.   Neurological: Positive for tremors. Negative for dizziness, seizures and headaches.       Reports neuropathy to hands and feet   Hematological: Does not bruise/bleed easily.  Psychiatric/Behavioral: Negative for agitation, confusion and hallucinations. The patient is not nervous/anxious.     Immunization History  Administered Date(s) Administered  . PPD Test 10/12/2015, 10/29/2015   Pertinent   Health Maintenance Due  Topic Date Due  . INFLUENZA VACCINE  09/03/2017 (Originally 04/03/2016)  . PNA vac Low Risk Adult (1 of 2 - PCV13) 09/04/2023 (Originally 05/26/2005)      Vitals:   08/09/16 1000  BP: 140/74  Pulse: 60  Resp: 16  Temp: 98.5 F (36.9 C)  SpO2: 93%  Weight: 204 lb (92.5 kg)  Height: 5\' 11"  (1.803 m)   Body mass index is 28.45 kg/m. Physical Exam  Constitutional: He is oriented to person, place, and time. He appears well-developed and well-nourished. No distress.  HENT:  Head: Normocephalic.  Mouth/Throat: Oropharynx is clear and moist. No oropharyngeal exudate.  Eyes: Conjunctivae and EOM are normal. Pupils are equal, round, and reactive to light. Right eye exhibits no discharge. Left eye exhibits no discharge. No scleral icterus.  Neck: Normal range of motion. No JVD present. No thyromegaly present.  Cardiovascular: Normal rate, regular rhythm, normal  heart sounds and intact distal pulses.  Exam reveals no gallop and no friction rub.   No murmur heard. Pulmonary/Chest: Effort normal and breath sounds normal. No respiratory distress. He has no wheezes. He has no rales.  Abdominal: Soft. Bowel sounds are normal. He exhibits no distension. There is no tenderness. There is no rebound and no guarding.  Musculoskeletal: Normal range of motion. He exhibits no edema or tenderness.  unsteady gait. Uses wheelchair   Lymphadenopathy:    He has no cervical adenopathy.  Neurological: He is oriented to person, place, and time.  Skin: Skin is warm. No rash noted. No erythema. No pallor.  Psychiatric: He has a normal mood and affect.    Labs reviewed:  Recent Labs  10/10/15 0849 10/17/15 03/12/16 03/29/16  NA 139 136* 136* 139  K 4.2 4.6 4.7 5.0  CL 106  --   --   --   CO2 26  --   --   --   GLUCOSE 105*  --   --   --   BUN 12 20 16 15   CREATININE 1.09 1.0 1.1 1.3  CALCIUM 9.3  --   --   --     Recent Labs  10/10/15 0849 10/17/15  AST 31 26  ALT 38 41*    ALKPHOS 45 37  BILITOT 0.8  --   PROT 8.0  --   ALBUMIN 4.2  --     Recent Labs  10/10/15 0849 10/17/15 03/12/16 03/29/16  WBC 11.6* 10.0 8.1 7.5  NEUTROABS 8.4*  --   --   --   HGB 15.4 15.1 15.4 14.4  HCT 45.7 44 47 43  MCV 95.3  --   --   --   PLT 207 231 235 220   Lab Results  Component Value Date   TSH 0.50 12/12/2015   Lab Results  Component Value Date   HGBA1C 5.8 03/29/2016   Lab Results  Component Value Date   CHOL 189 03/20/2016   HDL 38 03/20/2016   LDLCALC 126 03/20/2016   TRIG 127 03/20/2016   Assessment/Plan 1. Hypertensive heart disease without heart failure B/p stable. Continue on Lisinopril and Metoprolol. Monitor BMP   2. Chronic atrial fibrillation (HCC) HR controlled. continue on Metoprolol, pacerone and Plavix.   3. Peripheral polyneuropathy (HCC) Continue on Gabapentin.   4. Mixed hyperlipidemia Continue on Statin. Monitor Lipid panel.   5. BPH without obstruction/lower urinary tract symptoms Stable. Continue on Finasteride and Flomax.   6. Chronic pain syndrome Recently seen by pain management specialist  Oxycodone increased to 7.5 mg Tablet every 6 HRS and Lidoderm patch ordered. He states pain under control with current medication. He request Lidoderm patch to be discontinued states "Got sick and broke out in a rash whenever patch is applied".    Family/ staff Communication: Reviewed plan of care with patient and facility Nurse supervisor.   Labs/tests ordered: None

## 2016-08-14 DIAGNOSIS — M6281 Muscle weakness (generalized): Secondary | ICD-10-CM | POA: Diagnosis not present

## 2016-08-14 DIAGNOSIS — R278 Other lack of coordination: Secondary | ICD-10-CM | POA: Diagnosis not present

## 2016-08-15 DIAGNOSIS — M6281 Muscle weakness (generalized): Secondary | ICD-10-CM | POA: Diagnosis not present

## 2016-08-15 DIAGNOSIS — R278 Other lack of coordination: Secondary | ICD-10-CM | POA: Diagnosis not present

## 2016-08-15 DIAGNOSIS — G894 Chronic pain syndrome: Secondary | ICD-10-CM | POA: Diagnosis not present

## 2016-08-16 DIAGNOSIS — R278 Other lack of coordination: Secondary | ICD-10-CM | POA: Diagnosis not present

## 2016-08-16 DIAGNOSIS — M6281 Muscle weakness (generalized): Secondary | ICD-10-CM | POA: Diagnosis not present

## 2016-08-17 DIAGNOSIS — R278 Other lack of coordination: Secondary | ICD-10-CM | POA: Diagnosis not present

## 2016-08-17 DIAGNOSIS — M6281 Muscle weakness (generalized): Secondary | ICD-10-CM | POA: Diagnosis not present

## 2016-08-20 DIAGNOSIS — R278 Other lack of coordination: Secondary | ICD-10-CM | POA: Diagnosis not present

## 2016-08-20 DIAGNOSIS — M6281 Muscle weakness (generalized): Secondary | ICD-10-CM | POA: Diagnosis not present

## 2016-08-21 DIAGNOSIS — R278 Other lack of coordination: Secondary | ICD-10-CM | POA: Diagnosis not present

## 2016-08-21 DIAGNOSIS — M6281 Muscle weakness (generalized): Secondary | ICD-10-CM | POA: Diagnosis not present

## 2016-08-22 DIAGNOSIS — R278 Other lack of coordination: Secondary | ICD-10-CM | POA: Diagnosis not present

## 2016-08-22 DIAGNOSIS — G894 Chronic pain syndrome: Secondary | ICD-10-CM | POA: Diagnosis not present

## 2016-08-22 DIAGNOSIS — M6281 Muscle weakness (generalized): Secondary | ICD-10-CM | POA: Diagnosis not present

## 2016-08-23 DIAGNOSIS — R278 Other lack of coordination: Secondary | ICD-10-CM | POA: Diagnosis not present

## 2016-08-23 DIAGNOSIS — M6281 Muscle weakness (generalized): Secondary | ICD-10-CM | POA: Diagnosis not present

## 2016-08-24 DIAGNOSIS — M6281 Muscle weakness (generalized): Secondary | ICD-10-CM | POA: Diagnosis not present

## 2016-08-24 DIAGNOSIS — R278 Other lack of coordination: Secondary | ICD-10-CM | POA: Diagnosis not present

## 2016-08-25 DIAGNOSIS — M6281 Muscle weakness (generalized): Secondary | ICD-10-CM | POA: Diagnosis not present

## 2016-08-25 DIAGNOSIS — R278 Other lack of coordination: Secondary | ICD-10-CM | POA: Diagnosis not present

## 2016-08-28 DIAGNOSIS — R278 Other lack of coordination: Secondary | ICD-10-CM | POA: Diagnosis not present

## 2016-08-28 DIAGNOSIS — M6281 Muscle weakness (generalized): Secondary | ICD-10-CM | POA: Diagnosis not present

## 2016-08-29 DIAGNOSIS — G894 Chronic pain syndrome: Secondary | ICD-10-CM | POA: Diagnosis not present

## 2016-08-29 DIAGNOSIS — M6281 Muscle weakness (generalized): Secondary | ICD-10-CM | POA: Diagnosis not present

## 2016-08-29 DIAGNOSIS — R278 Other lack of coordination: Secondary | ICD-10-CM | POA: Diagnosis not present

## 2016-08-30 DIAGNOSIS — M6281 Muscle weakness (generalized): Secondary | ICD-10-CM | POA: Diagnosis not present

## 2016-08-30 DIAGNOSIS — R278 Other lack of coordination: Secondary | ICD-10-CM | POA: Diagnosis not present

## 2016-08-31 DIAGNOSIS — M6281 Muscle weakness (generalized): Secondary | ICD-10-CM | POA: Diagnosis not present

## 2016-08-31 DIAGNOSIS — R278 Other lack of coordination: Secondary | ICD-10-CM | POA: Diagnosis not present

## 2016-09-03 DIAGNOSIS — M6281 Muscle weakness (generalized): Secondary | ICD-10-CM | POA: Diagnosis not present

## 2016-09-03 DIAGNOSIS — R278 Other lack of coordination: Secondary | ICD-10-CM | POA: Diagnosis not present

## 2016-09-04 DIAGNOSIS — M6281 Muscle weakness (generalized): Secondary | ICD-10-CM | POA: Diagnosis not present

## 2016-09-04 DIAGNOSIS — R278 Other lack of coordination: Secondary | ICD-10-CM | POA: Diagnosis not present

## 2016-09-05 DIAGNOSIS — M6281 Muscle weakness (generalized): Secondary | ICD-10-CM | POA: Diagnosis not present

## 2016-09-05 DIAGNOSIS — R278 Other lack of coordination: Secondary | ICD-10-CM | POA: Diagnosis not present

## 2016-09-06 DIAGNOSIS — M6281 Muscle weakness (generalized): Secondary | ICD-10-CM | POA: Diagnosis not present

## 2016-09-06 DIAGNOSIS — R278 Other lack of coordination: Secondary | ICD-10-CM | POA: Diagnosis not present

## 2016-09-11 ENCOUNTER — Non-Acute Institutional Stay (SKILLED_NURSING_FACILITY): Payer: Medicare Other | Admitting: Family

## 2016-09-11 DIAGNOSIS — I2510 Atherosclerotic heart disease of native coronary artery without angina pectoris: Secondary | ICD-10-CM | POA: Diagnosis not present

## 2016-09-11 DIAGNOSIS — I119 Hypertensive heart disease without heart failure: Secondary | ICD-10-CM | POA: Diagnosis not present

## 2016-09-11 DIAGNOSIS — E782 Mixed hyperlipidemia: Secondary | ICD-10-CM

## 2016-09-11 DIAGNOSIS — I482 Chronic atrial fibrillation, unspecified: Secondary | ICD-10-CM

## 2016-09-11 DIAGNOSIS — G629 Polyneuropathy, unspecified: Secondary | ICD-10-CM

## 2016-09-11 NOTE — Progress Notes (Signed)
Location:  [place] Room Number: [place] of Service:  SNF (31) Provider:  Shivaay Stormont FNP-C   Blanchie Serve, MD  Patient Care Team: Blanchie Serve, MD as PCP - General (Internal Medicine)  Extended Emergency Contact Information Primary Emergency Contact: Levander Campion [place] of [place] Phone: [phone] Mobile Phone: [phone] Relation: Brother Secondary Emergency Contact: Loel Lofty [place] of [place] Phone: [phone] Mobile Phone: [phone] Relation: Son  Code Status:  Full Code  Goals of care: Advanced Directive information Advanced Directives 04/17/2016  Does Patient Have a Medical Advance Directive? No  Would patient like information on creating a medical advance directive? No - patient declined information     Chief Complaint  Patient presents with  . Medical Management of Chronic Issues    HPI:  Pt is a 76 y.o. male seen today at [facility] for medical management of chronic diseases. He has a medical history of  HTN, Afib,CAD, Neuropathy, Hyperlipidemia, BPH, chronic pain among other conditions. He is seen in his room today. He denies any acute issues this visit.He has had no recent fall episodes, weight changes or hospital admission since prior visit. He continues to self propel on wheelchair. He requires assistance with ADL's.   Past Medical History:  Diagnosis Date  . 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 Coronary artery disease    a. 2003 PCI to Ramus;  b. 11/2012 Cath: LM nl, LAD 30, LCX nl, RI 40 ISR, RCA nl.  . Hereditary and idiopathic peripheral neuropathy   . Hyperlipidemia   . Hypertensive heart disease    a. 08/2013 Echo: EF 70-75%, mild LVH.  Marland Kitchen Insomnia   . Paroxysmal atrial fibrillation (HCC)    a.08/2013-->amio;  b. CHA2DS2VASc = 4-->no longer on coumadin.  . Vitamin D deficiency    Past Surgical History:  Procedure Laterality Date    . CARDIAC CATHETERIZATION  11/13/12   [facility]; EF >55%  . CARDIAC CATHETERIZATION  2003  . JOINT REPLACEMENT     left hip  . NECK SURGERY      Allergies  Allergen Reactions  . Aspirin     Allergies as of 09/11/2016      Reactions   Aspirin       Medication List       Accurate as of 09/11/16  6:18 PM. Always use your most recent med list.          acetaminophen 500 MG tablet Commonly known as:  TYLENOL Take 1,000 mg by mouth every 6 (six) hours.   amiodarone 200 MG tablet Commonly known as:  PACERONE Take 1 tablet (200 mg total) by mouth at bedtime.   CALCIUM 600+D 600-400 MG-UNIT tablet Generic drug:  Calcium Carbonate-Vitamin D Take 1 tablet by mouth daily. Reported on 03/23/2016   cetirizine 10 MG tablet Commonly known as:  ZYRTEC Take 10 mg by mouth daily.   chlorhexidine 0.12 % solution Commonly known as:  PERIDEX Use as directed 10 mLs in the mouth or throat 3 (three) times daily. Rinse swish and spit   cholecalciferol 1000 units tablet Commonly known as:  VITAMIN D Take 1,000 Units by mouth daily.   clopidogrel 75 MG tablet Commonly known as:  PLAVIX Take 75 mg by mouth daily.   diltiazem 240 MG 24 hr capsule Commonly known as:  DILACOR XR Take 240 mg by mouth at bedtime. Hold if SBP < 110 or pulse <  60   finasteride 5 MG tablet Commonly known as:  PROSCAR Take 5 mg by mouth daily.   fluticasone 50 MCG/ACT nasal spray Commonly known as:  FLONASE Place 1 spray into both nostrils daily.   gabapentin 600 MG tablet Commonly known as:  NEURONTIN Take 1,200 mg by mouth 3 (three) times daily.   lisinopril 10 MG tablet Commonly known as:  PRINIVIL,ZESTRIL Take 10 mg by mouth daily.   magnesium hydroxide 400 MG/5ML suspension Commonly known as:  MILK OF MAGNESIA Take 30 mLs by mouth daily as needed for mild constipation or moderate constipation.   Melatonin 3 MG Tabs Take 1 tablet (3 mg total) by mouth at bedtime.   metoprolol succinate 25 MG 24  hr tablet Commonly known as:  TOPROL-XL Take 12.5 mg by mouth 2 (two) times daily.   nitroGLYCERIN 0.4 MG SL tablet Commonly known as:  NITROSTAT Place 0.4 mg under the tongue every 5 (five) minutes as needed for chest pain.   oxycodone 5 MG capsule Commonly known as:  OXY-IR Take one and 1/2 tablet (7.5mg ) by mouth every 6 hours as needed for pain   senna 8.6 MG tablet Commonly known as:  SENOKOT Take 2 tablets by mouth 2 (two) times daily.   tamsulosin 0.4 MG Caps capsule Commonly known as:  FLOMAX Take 0.4 mg by mouth daily.       Review of Systems  Constitutional: Negative for activity change, appetite change, chills, fatigue and fever.  HENT: Negative for congestion, hearing loss, rhinorrhea, sinus pressure, sneezing and sore throat.   Eyes: Negative.   Respiratory: Negative for cough, chest tightness, shortness of breath and wheezing.   Cardiovascular: Negative for chest pain, palpitations and leg swelling.  Gastrointestinal: Negative for abdominal distention, abdominal pain, blood in stool, constipation, diarrhea, nausea and vomiting.  Endocrine: Negative for cold intolerance, heat intolerance, polydipsia, polyphagia and polyuria.  Genitourinary: Negative for dysuria, flank pain, frequency, hematuria and urgency.  Musculoskeletal: Negative for joint swelling and neck stiffness.       Chronic shoulder pain, back pain and neck pain.  Skin: Negative for color change, pallor and rash.  Neurological: Positive for tremors. Negative for dizziness, seizures and headaches.       Reports neuropathy to hands and feet   Hematological: Does not bruise/bleed easily.  Psychiatric/Behavioral: Negative for agitation, confusion and hallucinations. The patient is not nervous/anxious.     Immunization History  Administered Date(s) Administered  . PPD Test 10/12/2015, 10/29/2015   Pertinent  Health Maintenance Due  Topic Date Due  . INFLUENZA VACCINE  09/03/2017 (Originally 04/03/2016)   . PNA vac Low Risk Adult (1 of 2 - PCV13) 09/04/2023 (Originally 05/26/2005)      Vitals:   09/11/16 1145  BP: 124/63  Pulse: (!) 56  Resp: 18  Temp: 97.6 F (36.4 C)  SpO2: 98%  Weight: 206 lb (93.4 kg)  Height: 5\' 11"  (1.803 m)   Body mass index is 28.73 kg/m. Physical Exam  Constitutional: He is oriented to person, place, and time. He appears well-developed and well-nourished. No distress.  HENT:  Head: Normocephalic.  Mouth/Throat: Oropharynx is clear and moist. No oropharyngeal exudate.  Eyes: Conjunctivae and EOM are normal. Pupils are equal, round, and reactive to light. Right eye exhibits no discharge. Left eye exhibits no discharge. No scleral icterus.  Neck: Normal range of motion. No JVD present. No thyromegaly present.  Cardiovascular: Normal rate, regular rhythm, normal heart sounds and intact distal pulses.  Exam reveals  no gallop and no friction rub.   No murmur heard. Pulmonary/Chest: Effort normal and breath sounds normal. No respiratory distress. He has no wheezes. He has no rales.  Abdominal: Soft. Bowel sounds are normal. He exhibits no distension. There is no tenderness. There is no rebound and no guarding.  Musculoskeletal: Normal range of motion. He exhibits no edema or tenderness.  unsteady gait. Self propel on wheelchair   Lymphadenopathy:    He has no cervical adenopathy.  Neurological: He is oriented to person, place, and time.  Skin: Skin is warm. No rash noted. No erythema. No pallor.  Psychiatric: He has a normal mood and affect.    Labs reviewed:  Recent Labs  10/10/15 0849 10/17/15 03/12/16 03/29/16  NA 139 136* 136* 139  K 4.2 4.6 4.7 5.0  CL 106  --   --   --   CO2 26  --   --   --   GLUCOSE 105*  --   --   --   BUN 12 20 16 15   CREATININE 1.09 1.0 1.1 1.3  CALCIUM 9.3  --   --   --     Recent Labs  10/10/15 0849 10/17/15  AST 31 26  ALT 38 41*  ALKPHOS 45 37  BILITOT 0.8  --   PROT 8.0  --   ALBUMIN 4.2  --     Recent  Labs  10/10/15 0849 10/17/15 03/12/16 03/29/16  WBC 11.6* 10.0 8.1 7.5  NEUTROABS 8.4*  --   --   --   HGB 15.4 15.1 15.4 14.4  HCT 45.7 44 47 43  MCV 95.3  --   --   --   PLT 207 231 235 220   Lab Results  Component Value Date   TSH 0.50 12/12/2015   Lab Results  Component Value Date   HGBA1C 5.8 03/29/2016   Lab Results  Component Value Date   CHOL 189 03/20/2016   HDL 38 03/20/2016   LDLCALC 126 03/20/2016   TRIG 127 03/20/2016   Assessment/Plan 1. Hypertensive heart disease without heart failure B/p stable. Continue on Lisinopril and Metoprolol. CBC,  BMP in 4 weeks.    2. Chronic atrial fibrillation (HCC) HR controlled. continue on Metoprolol, pacerone and Plavix.   3. CAD   Chest pain free. Continue on Plavix. Continue to control high risk factors.   4. Mixed hyperlipidemia Continue on Statin. Obtain Lipid panel in 4 weeks   5. Peripheral polyneuropathy (HCC) Continue on Gabapentin.    Family/ staff Communication: Reviewed plan of care with patient and facility Nurse supervisor.   Labs/tests ordered: CBC,  BMP and lipid panel in 4 weeks.

## 2016-09-13 ENCOUNTER — Other Ambulatory Visit: Payer: Self-pay

## 2016-09-13 MED ORDER — OXYCODONE HCL 5 MG PO CAPS: ORAL_CAPSULE | ORAL | 0 refills | Status: DC

## 2016-09-13 NOTE — Telephone Encounter (Signed)
Prescription request was received from:  [facility] [address] [place] [address]  Phone: [phone]  Fax: [phone]  

## 2016-09-17 DIAGNOSIS — G894 Chronic pain syndrome: Secondary | ICD-10-CM | POA: Diagnosis not present

## 2016-09-26 DIAGNOSIS — M79675 Pain in left toe(s): Secondary | ICD-10-CM | POA: Diagnosis not present

## 2016-09-26 DIAGNOSIS — M79674 Pain in right toe(s): Secondary | ICD-10-CM | POA: Diagnosis not present

## 2016-09-26 DIAGNOSIS — I70203 Unspecified atherosclerosis of native arteries of extremities, bilateral legs: Secondary | ICD-10-CM | POA: Diagnosis not present

## 2016-09-26 DIAGNOSIS — B351 Tinea unguium: Secondary | ICD-10-CM | POA: Diagnosis not present

## 2016-10-02 DIAGNOSIS — Z79899 Other long term (current) drug therapy: Secondary | ICD-10-CM | POA: Diagnosis not present

## 2016-10-02 LAB — HEMOGLOBIN A1C: HEMOGLOBIN A1C: 5.4

## 2016-10-02 LAB — CBC AND DIFFERENTIAL
HCT: 39 % — AB (ref 41–53)
Hemoglobin: 13.3 g/dL — AB (ref 13.5–17.5)
PLATELETS: 211 10*3/uL (ref 150–399)
WBC: 7.3 10^3/mL

## 2016-10-02 LAB — BASIC METABOLIC PANEL
BUN: 16 mg/dL (ref 4–21)
CREATININE: 1.1 mg/dL (ref 0.6–1.3)
GLUCOSE: 88 mg/dL
Potassium: 4.6 mmol/L (ref 3.4–5.3)
Sodium: 142 mmol/L (ref 137–147)

## 2016-10-08 DIAGNOSIS — G894 Chronic pain syndrome: Secondary | ICD-10-CM | POA: Diagnosis not present

## 2016-10-09 DIAGNOSIS — Z79899 Other long term (current) drug therapy: Secondary | ICD-10-CM | POA: Diagnosis not present

## 2016-10-09 LAB — CBC AND DIFFERENTIAL
HCT: 33 % — AB (ref 41–53)
Hemoglobin: 10.7 g/dL — AB (ref 13.5–17.5)
PLATELETS: 244 10*3/uL (ref 150–399)
WBC: 5.5 10^3/mL

## 2016-10-09 LAB — BASIC METABOLIC PANEL
BUN: 18 mg/dL (ref 4–21)
CREATININE: 1.1 mg/dL (ref 0.6–1.3)
Glucose: 87 mg/dL
POTASSIUM: 3.2 mmol/L — AB (ref 3.4–5.3)
SODIUM: 144 mmol/L (ref 137–147)

## 2016-10-09 LAB — LIPID PANEL
CHOLESTEROL: 166 mg/dL (ref 0–200)
HDL: 60 mg/dL (ref 35–70)
LDL Cholesterol: 60 mg/dL
TRIGLYCERIDES: 227 mg/dL — AB (ref 40–160)

## 2016-10-11 ENCOUNTER — Non-Acute Institutional Stay (SKILLED_NURSING_FACILITY): Payer: Medicare Other | Admitting: Family

## 2016-10-11 ENCOUNTER — Encounter: Payer: Self-pay | Admitting: Family

## 2016-10-11 DIAGNOSIS — I119 Hypertensive heart disease without heart failure: Secondary | ICD-10-CM

## 2016-10-11 DIAGNOSIS — N400 Enlarged prostate without lower urinary tract symptoms: Secondary | ICD-10-CM

## 2016-10-11 DIAGNOSIS — E782 Mixed hyperlipidemia: Secondary | ICD-10-CM | POA: Diagnosis not present

## 2016-10-11 DIAGNOSIS — G894 Chronic pain syndrome: Secondary | ICD-10-CM

## 2016-10-11 NOTE — Progress Notes (Signed)
Location:  [place] Room Number: 315-V Place of Service:  SNF (31) Provider:  Dinah Ngetich FNP-C   Blanchie Serve, MD  Patient Care Team: Blanchie Serve, MD as PCP - General (Internal Medicine)  Extended Emergency Contact Information Primary Emergency Contact: Levander Campion [place] Phone: [phone] Mobile Phone: [phone] Relation: Brother Secondary Emergency Contact: Loel Lofty [place] Phone: [phone] Mobile Phone: [phone] Relation: Son  Code Status:  Full Code  Goals of care: Advanced Directive information Advanced Directives 04/17/2016  Does Patient Have a Medical Advance Directive? No  Would patient like information on creating a medical advance directive? No - patient declined information     Chief Complaint  Patient presents with  . Medical Management of Chronic Issues    Routine Visit     HPI:  Pt is a 76 y.o. male seen today at [facility] for medical management of chronic diseases. He has a medical history of  HTN, Afib,CAD, Neuropathy, Hyperlipidemia, BPH, chronic pain among other conditions. He is seen in his room today. He denies any acute issues this visit.He has had no recent illness since prior visit. Facility Nurse reports no new concerns.   Past Medical History:  Diagnosis Date  . 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 Coronary artery disease    a. 2003 PCI to Ramus;  b. 11/2012 Cath: LM nl, LAD 30, LCX nl, RI 40 ISR, RCA nl.  . Hereditary and idiopathic peripheral neuropathy   . Hyperlipidemia   . Hypertensive heart disease    a. 08/2013 Echo: EF 70-75%, mild LVH.  Marland Kitchen Insomnia   . Paroxysmal atrial fibrillation (HCC)    a.08/2013-->amio;  b. CHA2DS2VASc = 4-->no longer on coumadin.  . Vitamin D deficiency    Past Surgical History:  Procedure Laterality Date  . CARDIAC CATHETERIZATION  11/13/12   [facility]; EF >55%  .  CARDIAC CATHETERIZATION  2003  . JOINT REPLACEMENT     left hip  . NECK SURGERY      Allergies  Allergen Reactions  . Aspirin     Allergies as of 10/11/2016      Reactions   Aspirin       Medication List       Accurate as of 10/11/16  1:24 PM. Always use your most recent med list.          acetaminophen 325 MG tablet Commonly known as:  TYLENOL Take 650 mg by mouth 4 (four) times daily.   amiodarone 200 MG tablet Commonly known as:  PACERONE Take 1 tablet (200 mg total) by mouth at bedtime.   CALCIUM 600+D 600-400 MG-UNIT tablet Generic drug:  Calcium Carbonate-Vitamin D Take 1 tablet by mouth daily. Reported on 03/23/2016   cetirizine 10 MG tablet Commonly known as:  ZYRTEC Take 10 mg by mouth daily.   chlorhexidine 0.12 % solution Commonly known as:  PERIDEX Use as directed 10 mLs in the mouth or throat 3 (three) times daily. Rinse swish and spit   cholecalciferol 1000 units tablet Commonly known as:  VITAMIN D Take 1,000 Units by mouth daily.   clopidogrel 75 MG tablet Commonly known as:  PLAVIX Take 75 mg by mouth daily.   diltiazem 240 MG 24 hr capsule Commonly known as:  DILACOR XR Take 240 mg by mouth at bedtime. Hold if SBP < 110 or pulse < 60   finasteride 5 MG tablet Commonly  known as:  PROSCAR Take 5 mg by mouth daily.   fluticasone 50 MCG/ACT nasal spray Commonly known as:  FLONASE Place 1 spray into both nostrils daily.   gabapentin 600 MG tablet Commonly known as:  NEURONTIN Take 1,200 mg by mouth 3 (three) times daily.   lisinopril 10 MG tablet Commonly known as:  PRINIVIL,ZESTRIL Take 10 mg by mouth daily.   magnesium hydroxide 400 MG/5ML suspension Commonly known as:  MILK OF MAGNESIA Take 30 mLs by mouth daily as needed for mild constipation or moderate constipation.   Melatonin 3 MG Tabs Take 1 tablet (3 mg total) by mouth at bedtime.   metoprolol succinate 25 MG 24 hr tablet Commonly known as:  TOPROL-XL Take 12.5 mg by  mouth 2 (two) times daily.   nitroGLYCERIN 0.4 MG SL tablet Commonly known as:  NITROSTAT Place 0.4 mg under the tongue every 5 (five) minutes as needed for chest pain.   ondansetron 4 MG tablet Commonly known as:  ZOFRAN Take 4 mg by mouth every 6 (six) hours.   oxycodone 5 MG capsule Commonly known as:  OXY-IR Take one and 1/2 tablet (7.5mg ) by mouth every 6 hours as needed for pain   senna 8.6 MG tablet Commonly known as:  SENOKOT Take 2 tablets by mouth 2 (two) times daily.   tamsulosin 0.4 MG Caps capsule Commonly known as:  FLOMAX Take 0.4 mg by mouth daily.       Review of Systems  Constitutional: Negative for activity change, appetite change, chills, fatigue and fever.  HENT: Negative for congestion, hearing loss, rhinorrhea, sinus pressure, sneezing and sore throat.   Eyes: Negative.   Respiratory: Negative for cough, chest tightness, shortness of breath and wheezing.   Cardiovascular: Negative for chest pain, palpitations and leg swelling.  Gastrointestinal: Negative for abdominal distention, abdominal pain, blood in stool, constipation, diarrhea, nausea and vomiting.  Endocrine: Negative for cold intolerance, heat intolerance, polydipsia, polyphagia and polyuria.  Genitourinary: Negative for dysuria, flank pain, frequency, hematuria and urgency.  Musculoskeletal: Negative for joint swelling and neck stiffness.       Chronic shoulder pain,back pain and neck pain.  Skin: Negative for color change, pallor and rash.  Neurological: Positive for tremors. Negative for dizziness, seizures and headaches.       Numbness and tingling to fingers and feet  Hematological: Does not bruise/bleed easily.  Psychiatric/Behavioral: Negative for agitation, confusion and hallucinations. The patient is not nervous/anxious.     Immunization History  Administered Date(s) Administered  . PPD Test 10/12/2015, 10/29/2015   Pertinent  Health Maintenance Due  Topic Date Due  . INFLUENZA  VACCINE  09/03/2017 (Originally 04/03/2016)  . PNA vac Low Risk Adult (1 of 2 - PCV13) 09/04/2023 (Originally 05/26/2005)      Vitals:   10/11/16 1316  BP: 130/72  Pulse: 76  Resp: 20  Temp: 97.8 F (36.6 C)  TempSrc: Oral  Weight: 207 lb (93.9 kg)  Height: 5\' 11"  (1.803 m)   Body mass index is 28.87 kg/m. Physical Exam  Constitutional: He is oriented to person, place, and time. He appears well-developed and well-nourished. No distress.  HENT:  Head: Normocephalic.  Mouth/Throat: Oropharynx is clear and moist. No oropharyngeal exudate.  Eyes: Conjunctivae and EOM are normal. Pupils are equal, round, and reactive to light. Right eye exhibits no discharge. Left eye exhibits no discharge. No scleral icterus.  Neck: Normal range of motion. No JVD present. No thyromegaly present.  Cardiovascular: Normal rate, regular rhythm,  normal heart sounds and intact distal pulses.  Exam reveals no gallop and no friction rub.   No murmur heard. Pulmonary/Chest: Effort normal and breath sounds normal. No respiratory distress. He has no wheezes. He has no rales.  Abdominal: Soft. Bowel sounds are normal. He exhibits no distension. There is no tenderness. There is no rebound and no guarding.  Genitourinary:  Genitourinary Comments: Continent   Musculoskeletal: Normal range of motion. He exhibits no edema or tenderness.  Moves x 4 extremities.   Lymphadenopathy:    He has no cervical adenopathy.  Neurological: He is oriented to person, place, and time.  Skin: Skin is warm and dry. No rash noted. No erythema. No pallor.  Psychiatric: He has a normal mood and affect.    Labs reviewed:  Recent Labs  03/12/16 03/29/16 10/02/16  NA 136* 139 142  K 4.7 5.0 4.6  BUN 16 15 16   CREATININE 1.1 1.3 1.1    Recent Labs  10/17/15  AST 26  ALT 41*  ALKPHOS 37    Recent Labs  03/12/16 03/29/16 10/02/16  WBC 8.1 7.5 7.3  HGB 15.4 14.4 13.3*  HCT 47 43 39*  PLT 235 220 211   Lab Results    Component Value Date   TSH 0.50 12/12/2015   Lab Results  Component Value Date   HGBA1C 5.4 10/02/2016   Lab Results  Component Value Date   CHOL 189 03/20/2016   HDL 38 03/20/2016   LDLCALC 126 03/20/2016   TRIG 127 03/20/2016   Assessment/Plan 1. Hypertensive heart disease without heart failure B/p stable. Continue on Lisinopril and Metoprolol. Continue to monitor  BMP    2. Mixed hyperlipidemia Continue on Statin. Monitor Lipid panel periodically.   3. BPH  Symptoms controlled.Continue .   4. Chronic pain   Pain under control with current regimen. Continue to monitor.follow up with PMR as needed.    Family/ staff Communication: Reviewed plan of care with patient and facility Nurse supervisor.   Labs/tests ordered: none

## 2016-10-17 DIAGNOSIS — G894 Chronic pain syndrome: Secondary | ICD-10-CM | POA: Diagnosis not present

## 2016-10-23 DIAGNOSIS — M6281 Muscle weakness (generalized): Secondary | ICD-10-CM | POA: Diagnosis not present

## 2016-10-23 DIAGNOSIS — R278 Other lack of coordination: Secondary | ICD-10-CM | POA: Diagnosis not present

## 2016-10-29 DIAGNOSIS — G894 Chronic pain syndrome: Secondary | ICD-10-CM | POA: Diagnosis not present

## 2016-11-07 ENCOUNTER — Non-Acute Institutional Stay (SKILLED_NURSING_FACILITY): Payer: Medicare Other | Admitting: Family

## 2016-11-07 DIAGNOSIS — G4700 Insomnia, unspecified: Secondary | ICD-10-CM

## 2016-11-07 DIAGNOSIS — N400 Enlarged prostate without lower urinary tract symptoms: Secondary | ICD-10-CM

## 2016-11-07 DIAGNOSIS — G609 Hereditary and idiopathic neuropathy, unspecified: Secondary | ICD-10-CM

## 2016-11-07 NOTE — Progress Notes (Signed)
Location:  [place] Room Number: 426  Place of Service:  SNF (31) Provider:  Dinah Ngetich FNP-C   Blanchie Serve, MD  Patient Care Team: Blanchie Serve, MD as PCP - General (Internal Medicine)  Extended Emergency Contact Information Primary Emergency Contact: Levander Campion [place] Phone: [phone] Mobile Phone: [phone] Relation: Brother Secondary Emergency Contact: Loel Lofty [place] Phone: [phone] Mobile Phone: [phone] Relation: Son  Code Status:  Full Code  Goals of care: Advanced Directive information Advanced Directives 04/17/2016  Does Patient Have a Medical Advance Directive? No  Would patient like information on creating a medical advance directive? No - patient declined information     Chief Complaint  Patient presents with  . Medical Management of Chronic Issues    HPI:  Pt is a 76 y.o. male seen today at [facility] for medical management of chronic diseases. He has a medical history of  HTN, Afib,CAD, Neuropathy, Hyperlipidemia, BPH, chronic pain syndrome among other conditions. He is seen in his room today. He has had no recent fall episode or acute illness since prior visit. He continues to spend most time self propelling on wheelchair. His brother visit often.   Past Medical History:  Diagnosis Date  . 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 Coronary artery disease    a. 2003 PCI to Ramus;  b. 11/2012 Cath: LM nl, LAD 30, LCX nl, RI 40 ISR, RCA nl.  . Hereditary and idiopathic peripheral neuropathy   . Hyperlipidemia   . Hypertensive heart disease    a. 08/2013 Echo: EF 70-75%, mild LVH.  Marland Kitchen Insomnia   . Paroxysmal atrial fibrillation (HCC)    a.08/2013-->amio;  b. CHA2DS2VASc = 4-->no longer on coumadin.  . Vitamin D deficiency    Past Surgical History:  Procedure Laterality Date  . CARDIAC CATHETERIZATION  11/13/12   [facility]; EF >55%  . CARDIAC CATHETERIZATION  2003  . JOINT REPLACEMENT     left hip  . NECK SURGERY      Allergies  Allergen Reactions  . Aspirin     Allergies as of 11/07/2016      Reactions   Aspirin       Medication List       Accurate as of 11/07/16  3:45 PM. Always use your most recent med list.          acetaminophen 325 MG tablet Commonly known as:  TYLENOL Take 650 mg by mouth 4 (four) times daily.   amiodarone 200 MG tablet Commonly known as:  PACERONE Take 1 tablet (200 mg total) by mouth at bedtime.   CALCIUM 600+D 600-400 MG-UNIT tablet Generic drug:  Calcium Carbonate-Vitamin D Take 1 tablet by mouth daily. Reported on 03/23/2016   cetirizine 10 MG tablet Commonly known as:  ZYRTEC Take 10 mg by mouth daily.   chlorhexidine 0.12 % solution Commonly known as:  PERIDEX Use as directed 10 mLs in the mouth or throat 3 (three) times daily. Rinse swish and spit   cholecalciferol 1000 units tablet Commonly known as:  VITAMIN D Take 1,000 Units by mouth daily.   clopidogrel 75 MG tablet Commonly known as:  PLAVIX Take 75 mg by mouth daily.   diltiazem 240 MG 24 hr capsule Commonly known as:  DILACOR XR Take 240 mg by mouth at bedtime. Hold if SBP < 110 or pulse < 60   finasteride 5 MG tablet  Commonly known as:  PROSCAR Take 5 mg by mouth daily.   fluticasone 50 MCG/ACT nasal spray Commonly known as:  FLONASE Place 1 spray into both nostrils daily.   gabapentin 600 MG tablet Commonly known as:  NEURONTIN Take 1,200 mg by mouth 3 (three) times daily.   lisinopril 10 MG tablet Commonly known as:  PRINIVIL,ZESTRIL Take 10 mg by mouth daily.   magnesium hydroxide 400 MG/5ML suspension Commonly known as:  MILK OF MAGNESIA Take 30 mLs by mouth daily as needed for mild constipation or moderate constipation.   Melatonin 3 MG Tabs Take 1 tablet (3 mg total) by mouth at bedtime.   metoprolol succinate 25 MG 24 hr tablet Commonly known as:   TOPROL-XL Take 12.5 mg by mouth 2 (two) times daily.   nitroGLYCERIN 0.4 MG SL tablet Commonly known as:  NITROSTAT Place 0.4 mg under the tongue every 5 (five) minutes as needed for chest pain.   ondansetron 4 MG tablet Commonly known as:  ZOFRAN Take 4 mg by mouth every 6 (six) hours.   oxycodone 5 MG capsule Commonly known as:  OXY-IR Take one and 1/2 tablet (7.5mg ) by mouth every 6 hours as needed for pain   senna 8.6 MG tablet Commonly known as:  SENOKOT Take 2 tablets by mouth 2 (two) times daily.   tamsulosin 0.4 MG Caps capsule Commonly known as:  FLOMAX Take 0.4 mg by mouth daily.       Review of Systems  Constitutional: Negative for activity change, appetite change, chills, fatigue and fever.  HENT: Negative for congestion, hearing loss, rhinorrhea, sinus pressure, sneezing and sore throat.   Eyes: Negative.   Respiratory: Negative for cough, chest tightness, shortness of breath and wheezing.   Cardiovascular: Negative for chest pain, palpitations and leg swelling.  Gastrointestinal: Negative for abdominal distention, abdominal pain, blood in stool, constipation, diarrhea, nausea and vomiting.  Endocrine: Negative for cold intolerance, heat intolerance, polydipsia, polyphagia and polyuria.  Genitourinary: Negative for dysuria, flank pain, frequency, hematuria and urgency.  Musculoskeletal: Negative for joint swelling and neck stiffness.        shoulder pain,back pain and neck pain current pain regimen effective.   Skin: Negative for color change, pallor and rash.  Neurological: Positive for tremors. Negative for dizziness, seizures and headaches.       Numbness and tingling to fingers and feet. Lower extremities weakness   Hematological: Does not bruise/bleed easily.  Psychiatric/Behavioral: Negative for agitation, confusion and hallucinations. The patient is not nervous/anxious.     Immunization History  Administered Date(s) Administered  . PPD Test  10/12/2015, 10/29/2015   Pertinent  Health Maintenance Due  Topic Date Due  . INFLUENZA VACCINE  09/03/2017 (Originally 04/03/2016)  . PNA vac Low Risk Adult (1 of 2 - PCV13) 09/04/2023 (Originally 05/26/2005)      Vitals:   11/07/16 1130  BP: (!) 142/76  Pulse: 62  Resp: 18  Temp: 97.2 F (36.2 C)  SpO2: 98%  Weight: 208 lb 6.4 oz (94.5 kg)  Height: 5\' 11"  (1.803 m)   Body mass index is 29.07 kg/m. Physical Exam  Constitutional: He is oriented to person, place, and time. He appears well-developed and well-nourished. No distress.  HENT:  Head: Normocephalic.  Mouth/Throat: Oropharynx is clear and moist. No oropharyngeal exudate.  Eyes: Conjunctivae and EOM are normal. Pupils are equal, round, and reactive to light. Right eye exhibits no discharge. Left eye exhibits no discharge. No scleral icterus.  Neck: Normal range of  motion. No JVD present. No thyromegaly present.  Cardiovascular: Normal rate, regular rhythm, normal heart sounds and intact distal pulses.  Exam reveals no gallop and no friction rub.   No murmur heard. Pulmonary/Chest: Effort normal and breath sounds normal. No respiratory distress. He has no wheezes. He has no rales.  Abdominal: Soft. Bowel sounds are normal. He exhibits no distension. There is no tenderness. There is no rebound and no guarding.  Genitourinary:  Genitourinary Comments: Continent   Musculoskeletal: Normal range of motion. He exhibits no edema or tenderness.  Moves x 4 extremities. Unsteady gait. Lower extremities muscle weakness   Lymphadenopathy:    He has no cervical adenopathy.  Neurological: He is oriented to person, place, and time.  Skin: Skin is warm and dry. No rash noted. No erythema. No pallor.  Psychiatric: He has a normal mood and affect.    Labs reviewed:  Recent Labs  03/29/16 10/02/16 10/09/16  NA 139 142 144  K 5.0 4.6 3.2*  BUN 15 16 18   CREATININE 1.3 1.1 1.1    Recent Labs  03/29/16 10/02/16 10/09/16  WBC 7.5  7.3 5.5  HGB 14.4 13.3* 10.7*  HCT 43 39* 33*  PLT 220 211 244   Lab Results  Component Value Date   TSH 0.50 12/12/2015   Lab Results  Component Value Date   HGBA1C 5.4 10/02/2016   Lab Results  Component Value Date   CHOL 166 10/09/2016   HDL 60 10/09/2016   LDLCALC 60 10/09/2016   TRIG 227 (A) 10/09/2016   Assessment/Plan 1.  BPH  Stable.Continue on Tamsulosin and finasteride. Continue to follow up with urology as needed.   2. Peripheral Neuropathy  Continue on gabapentin.Fall and safety precautions.    3.  Insomnia  Continue on melatonin.    Family/ staff Communication: Reviewed plan of care with patient and facility Nurse supervisor.   Labs/tests ordered: none

## 2016-11-19 DIAGNOSIS — G894 Chronic pain syndrome: Secondary | ICD-10-CM | POA: Diagnosis not present

## 2016-11-26 DIAGNOSIS — G894 Chronic pain syndrome: Secondary | ICD-10-CM | POA: Diagnosis not present

## 2016-12-03 DIAGNOSIS — Z79899 Other long term (current) drug therapy: Secondary | ICD-10-CM | POA: Diagnosis not present

## 2016-12-03 LAB — TSH: TSH: 1.53 u[IU]/mL (ref 0.41–5.90)

## 2016-12-07 DIAGNOSIS — G894 Chronic pain syndrome: Secondary | ICD-10-CM | POA: Diagnosis not present

## 2016-12-10 ENCOUNTER — Non-Acute Institutional Stay (SKILLED_NURSING_FACILITY): Payer: Medicare Other | Admitting: Family

## 2016-12-10 ENCOUNTER — Encounter: Payer: Self-pay | Admitting: Family

## 2016-12-10 DIAGNOSIS — I119 Hypertensive heart disease without heart failure: Secondary | ICD-10-CM | POA: Diagnosis not present

## 2016-12-10 DIAGNOSIS — I482 Chronic atrial fibrillation, unspecified: Secondary | ICD-10-CM

## 2016-12-10 DIAGNOSIS — E782 Mixed hyperlipidemia: Secondary | ICD-10-CM | POA: Diagnosis not present

## 2016-12-10 NOTE — Progress Notes (Signed)
Location:  [facility] Room Number: 970 Place of Service:  SNF (31) Provider:  Cylan Borum FNP-C   Blanchie Serve, MD  Patient Care Team: Blanchie Serve, MD as PCP - General (Internal Medicine)  Extended Emergency Contact Information Primary Emergency Contact: Levander Campion [place] Phone: [phone] Mobile Phone: [phone] Relation: Brother Secondary Emergency Contact: Loel Lofty [place] Phone: [phone] Mobile Phone: [phone] Relation: Son  Code Status:  Full Code  Goals of care: Advanced Directive information Advanced Directives 12/10/2016  Does Patient Have a Medical Advance Directive? No  Would patient like information on creating a medical advance directive? No - Patient declined     Chief Complaint  Patient presents with  . Medical Management of Chronic Issues    Routine Visit    HPI:  Pt is a 76 y.o. male seen today at [facility] for medical management of chronic diseases. He has a medical history of  HTN, Afib,CAD,Peripheral Neuropathy, Hyperlipidemia, BPH, chronic pain syndrome among other conditions. He is seen in his room today. He reports feeling sad this visit states his brother is currently in the hospital in a ventilator due to recent Heart attack.He states coping well but does not want to visit him while he has tubes all over.He denies any acute issues this visit. No recent weight changes or fall episode. Facility Nurse reports no new concerns.   Past Medical History:  Diagnosis Date  . 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 Coronary artery disease    a. 2003 PCI to Ramus;  b. 11/2012 Cath: LM nl, LAD 30, LCX nl, RI 40 ISR, RCA nl.  . Hereditary and idiopathic peripheral neuropathy   . Hyperlipidemia   . Hypertensive heart disease    a. 08/2013 Echo: EF 70-75%, mild LVH.  Marland Kitchen Insomnia   . Paroxysmal atrial fibrillation (HCC)    a.08/2013-->amio;  b. CHA2DS2VASc = 4-->no longer on coumadin.  . Vitamin D deficiency    Past Surgical History:  Procedure Laterality Date  . CARDIAC CATHETERIZATION  11/13/12   [facility]; EF >55%  . CARDIAC CATHETERIZATION  2003  . JOINT REPLACEMENT     left hip  . NECK SURGERY      Allergies  Allergen Reactions  . Aspirin     Allergies as of 12/10/2016      Reactions   Aspirin       Medication List       Accurate as of 12/10/16  3:41 PM. Always use your most recent med list.          acetaminophen 325 MG tablet Commonly known as:  TYLENOL Take 650 mg by mouth 4 (four) times daily.   amiodarone 200 MG tablet Commonly known as:  PACERONE Take 1 tablet (200 mg total) by mouth at bedtime.   CALCIUM 600+D 600-400 MG-UNIT tablet Generic drug:  Calcium Carbonate-Vitamin D Take 1 tablet by mouth daily. Reported on 03/23/2016   cetirizine 10 MG tablet Commonly known as:  ZYRTEC Take 10 mg by mouth daily.   chlorhexidine 0.12 % solution Commonly known as:  PERIDEX Use as directed 10 mLs in the mouth or throat 3 (three) times daily. Rinse swish and spit   cholecalciferol 1000 units tablet Commonly known as:  VITAMIN D Take 1,000 Units by mouth daily.   clopidogrel 75 MG tablet Commonly known as:  PLAVIX Take 75 mg by mouth daily.   diltiazem  240 MG 24 hr capsule Commonly known as:  DILACOR XR Take 240 mg by mouth at bedtime. Hold if SBP < 110 or pulse < 60   finasteride 5 MG tablet Commonly known as:  PROSCAR Take 5 mg by mouth daily.   fluticasone 50 MCG/ACT nasal spray Commonly known as:  FLONASE Place 1 spray into both nostrils daily.   gabapentin 600 MG tablet Commonly known as:  NEURONTIN Take 1,200 mg by mouth 3 (three) times daily.   lisinopril 10 MG tablet Commonly known as:  PRINIVIL,ZESTRIL Take 10 mg by mouth daily.   magnesium hydroxide 400 MG/5ML suspension Commonly known as:  MILK OF MAGNESIA Take 30 mLs by mouth daily as needed for mild  constipation or moderate constipation.   Melatonin 3 MG Tabs Take 1 tablet (3 mg total) by mouth at bedtime.   metoprolol succinate 25 MG 24 hr tablet Commonly known as:  TOPROL-XL Take 12.5 mg by mouth 2 (two) times daily.   nitroGLYCERIN 0.4 MG SL tablet Commonly known as:  NITROSTAT Place 0.4 mg under the tongue every 5 (five) minutes as needed for chest pain.   ondansetron 4 MG tablet Commonly known as:  ZOFRAN Take 4 mg by mouth every 6 (six) hours.   oxycodone 5 MG capsule Commonly known as:  OXY-IR Take one and 1/2 tablet (7.5mg ) by mouth every 6 hours as needed for pain   senna 8.6 MG tablet Commonly known as:  SENOKOT Take 2 tablets by mouth 2 (two) times daily.   tamsulosin 0.4 MG Caps capsule Commonly known as:  FLOMAX Take 0.4 mg by mouth daily.       Review of Systems  Constitutional: Negative for activity change, appetite change, chills, fatigue and fever.  HENT: Negative for congestion, hearing loss, rhinorrhea, sinus pressure, sneezing and sore throat.   Eyes: Negative.   Respiratory: Negative for cough, chest tightness, shortness of breath and wheezing.   Cardiovascular: Negative for chest pain, palpitations and leg swelling.  Gastrointestinal: Negative for abdominal distention, abdominal pain, blood in stool, constipation, diarrhea, nausea and vomiting.  Endocrine: Negative for cold intolerance, heat intolerance, polydipsia, polyphagia and polyuria.  Genitourinary: Negative for dysuria, flank pain, frequency, hematuria and urgency.  Musculoskeletal: Negative for joint swelling and neck stiffness.       Chronic pain   Skin: Negative for color change, pallor and rash.  Neurological: Positive for tremors. Negative for dizziness, seizures and headaches.       Numbness and tingling to fingers and feet  Hematological: Does not bruise/bleed easily.  Psychiatric/Behavioral: Negative for agitation, confusion, hallucinations and sleep disturbance. The patient is  not nervous/anxious.     Immunization History  Administered Date(s) Administered  . PPD Test 10/12/2015, 10/29/2015   Pertinent  Health Maintenance Due  Topic Date Due  . INFLUENZA VACCINE  09/03/2017 (Originally 04/03/2017)  . PNA vac Low Risk Adult (1 of 2 - PCV13) 09/04/2023 (Originally 05/26/2005)      Vitals:   12/10/16 1017  BP: 137/79  Pulse: (!) 52  Resp: 18  Temp: 97.9 F (36.6 C)  TempSrc: Oral  SpO2: 94%  Weight: 210 lb (95.3 kg)  Height: 5\' 11"  (1.803 m)   Body mass index is 29.29 kg/m. Physical Exam  Constitutional: He is oriented to person, place, and time. He appears well-developed and well-nourished. No distress.  HENT:  Head: Normocephalic.  Mouth/Throat: Oropharynx is clear and moist. No oropharyngeal exudate.  Eyes: Conjunctivae and EOM are normal. Pupils are equal,  round, and reactive to light. Right eye exhibits no discharge. Left eye exhibits no discharge. No scleral icterus.  Neck: Normal range of motion. No JVD present. No thyromegaly present.  Cardiovascular: Normal rate, regular rhythm, normal heart sounds and intact distal pulses.  Exam reveals no gallop and no friction rub.   No murmur heard. Pulmonary/Chest: Effort normal and breath sounds normal. No respiratory distress. He has no wheezes. He has no rales.  Abdominal: Soft. Bowel sounds are normal. He exhibits no distension. There is no tenderness. There is no rebound and no guarding.  Musculoskeletal: Normal range of motion. He exhibits no edema or tenderness.  Moves x 4 extremities. Unsteady gait   Lymphadenopathy:    He has no cervical adenopathy.  Neurological: He is oriented to person, place, and time.  Skin: Skin is warm and dry. No rash noted. No erythema. No pallor.  Psychiatric: He has a normal mood and affect.    Labs reviewed:  Recent Labs  03/29/16 10/02/16 10/09/16  NA 139 142 144  K 5.0 4.6 3.2*  BUN 15 16 18   CREATININE 1.3 1.1 1.1    Recent Labs  03/29/16 10/02/16  10/09/16  WBC 7.5 7.3 5.5  HGB 14.4 13.3* 10.7*  HCT 43 39* 33*  PLT 220 211 244   Lab Results  Component Value Date   TSH 0.50 12/12/2015   Lab Results  Component Value Date   HGBA1C 5.4 10/02/2016   Lab Results  Component Value Date   CHOL 166 10/09/2016   HDL 60 10/09/2016   LDLCALC 60 10/09/2016   TRIG 227 (A) 10/09/2016   Assessment/Plan 1. HTN B/p stable.continue on lisinopril, diltiazem, amiodarone and metoprolol. Monitor BMP  2. Afib  HR controlled.continue on Plavix,diltiazem, amiodarone and metoprolol   3. Hyperlipidemia  LDL at goal < 70. Continue to monitor lipid panel.   Family/ staff Communication: Reviewed plan of care with patient and facility Nurse supervisor.   Labs/tests ordered: none

## 2016-12-20 DIAGNOSIS — M79675 Pain in left toe(s): Secondary | ICD-10-CM | POA: Diagnosis not present

## 2016-12-20 DIAGNOSIS — I70203 Unspecified atherosclerosis of native arteries of extremities, bilateral legs: Secondary | ICD-10-CM | POA: Diagnosis not present

## 2016-12-20 DIAGNOSIS — M79674 Pain in right toe(s): Secondary | ICD-10-CM | POA: Diagnosis not present

## 2016-12-20 DIAGNOSIS — B351 Tinea unguium: Secondary | ICD-10-CM | POA: Diagnosis not present

## 2016-12-28 DIAGNOSIS — Z79899 Other long term (current) drug therapy: Secondary | ICD-10-CM | POA: Diagnosis not present

## 2016-12-28 LAB — BASIC METABOLIC PANEL
BUN: 22 mg/dL — AB (ref 4–21)
Creatinine: 1.1 mg/dL (ref 0.6–1.3)
Glucose: 96 mg/dL
POTASSIUM: 4.6 mmol/L (ref 3.4–5.3)
Sodium: 141 mmol/L (ref 137–147)

## 2016-12-28 LAB — CBC AND DIFFERENTIAL
HCT: 37 % — AB (ref 41–53)
HEMOGLOBIN: 12.6 g/dL — AB (ref 13.5–17.5)
Platelets: 216 10*3/uL (ref 150–399)
WBC: 6.7 10*3/mL

## 2016-12-28 LAB — HEMOGLOBIN A1C: HEMOGLOBIN A1C: 5.6

## 2017-01-03 ENCOUNTER — Non-Acute Institutional Stay (SKILLED_NURSING_FACILITY): Payer: Medicare Other | Admitting: Internal Medicine

## 2017-01-03 ENCOUNTER — Encounter: Payer: Self-pay | Admitting: Internal Medicine

## 2017-01-03 DIAGNOSIS — J302 Other seasonal allergic rhinitis: Secondary | ICD-10-CM | POA: Insufficient documentation

## 2017-01-03 DIAGNOSIS — I480 Paroxysmal atrial fibrillation: Secondary | ICD-10-CM

## 2017-01-03 DIAGNOSIS — I2510 Atherosclerotic heart disease of native coronary artery without angina pectoris: Secondary | ICD-10-CM

## 2017-01-03 DIAGNOSIS — G609 Hereditary and idiopathic neuropathy, unspecified: Secondary | ICD-10-CM

## 2017-01-03 DIAGNOSIS — N400 Enlarged prostate without lower urinary tract symptoms: Secondary | ICD-10-CM | POA: Diagnosis not present

## 2017-01-03 NOTE — Progress Notes (Signed)
Patient ID: Joel Wilson., male   DOB: Dec 26, 1940, 76 y.o.   MRN: [id number]    LOCATION: [name]  PCP: Blanchie Serve, MD   Code Status: Full Code  Goals of care: Advanced Directive information Advanced Directives 12/10/2016  Does Patient Have a Medical Advance Directive? No  Would patient like information on creating a medical advance directive? No - Patient declined    Extended Emergency Contact Information Primary Emergency Contact: Levander Campion [place] Phone: [phone] Mobile Phone: [phone] Relation: Brother Secondary Emergency Contact: [place] Phone: [phone] Mobile Phone: [phone] Relation: Son   Allergies  Allergen Reactions  . Aspirin     Chief Complaint  Patient presents with  . Medical Management of Chronic Issues    Routine Visit      HPI:  Patient is a 76 y.o. male seen today for routine visit. He denies any concern this visit. He has chronic pain and pain med has been helpful. No new concern from nursing.   Review of Systems:  Constitutional: Negative for fever.  HENT: Negative for headache, congestion, nasal discharge Eyes: Negative for blurred vision, double vision and discharge. wears glassed.  Respiratory: Negative for cough, shortness of breath and wheezing.   Cardiovascular: Negative for chest pain, palpitations. Positive for chronic leg swelling.  Gastrointestinal: Negative for heartburn, nausea, vomiting, abdominal pain. Had bowel movement this am Genitourinary: Negative for dysuria Musculoskeletal: Negative for fall in the facility. Has chronic pain syndrome.  Skin: Negative for itching, rash.  Neurological: has history of neuropathy Psychiatric/Behavioral: Negative for depression   Past Medical History:  Diagnosis Date  . 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 Coronary artery disease    a. 2003 PCI to Ramus;  b. 11/2012 Cath: LM nl, LAD  30, LCX nl, RI 40 ISR, RCA nl.  . Hereditary and idiopathic peripheral neuropathy   . Hyperlipidemia   . Hypertensive heart disease    a. 08/2013 Echo: EF 70-75%, mild LVH.  Marland Kitchen Insomnia   . Paroxysmal atrial fibrillation (HCC)    a.08/2013-->amio;  b. CHA2DS2VASc = 4-->no longer on coumadin.  . Vitamin D deficiency     Medications: Allergies as of 01/03/2017      Reactions   Aspirin       Medication List       Accurate as of 01/03/17 11:24 AM. Always use your most recent med list.          acetaminophen 325 MG tablet Commonly known as:  TYLENOL Take 650 mg by mouth 4 (four) times daily.   amiodarone 200 MG tablet Commonly known as:  PACERONE Take 1 tablet (200 mg total) by mouth at bedtime.   CALCIUM 600+D 600-400 MG-UNIT tablet Generic drug:  Calcium Carbonate-Vitamin D Take 1 tablet by mouth daily. Reported on 03/23/2016   cetirizine 10 MG tablet Commonly known as:  ZYRTEC Take 10 mg by mouth daily.   chlorhexidine 0.12 % solution Commonly known as:  PERIDEX Use as directed 10 mLs in the mouth or throat 3 (three) times daily. Rinse swish and spit   cholecalciferol 1000 units tablet Commonly known as:  VITAMIN D Take 1,000 Units by mouth daily.   clopidogrel 75 MG tablet Commonly known as:  PLAVIX Take 75 mg by mouth daily.   diltiazem 240 MG 24 hr capsule Commonly known as:  DILACOR XR Take 240 mg by mouth at bedtime. Hold if SBP < 110 or pulse <  60   finasteride 5 MG tablet Commonly known as:  PROSCAR Take 5 mg by mouth daily.   gabapentin 600 MG tablet Commonly known as:  NEURONTIN Take 1,200 mg by mouth 3 (three) times daily.   lisinopril 10 MG tablet Commonly known as:  PRINIVIL,ZESTRIL Take 10 mg by mouth daily.   magnesium hydroxide 400 MG/5ML suspension Commonly known as:  MILK OF MAGNESIA Take 30 mLs by mouth daily as needed for mild constipation or moderate constipation.   Melatonin 3 MG Tabs Take 1 tablet (3 mg total) by mouth at  bedtime.   metoprolol succinate 25 MG 24 hr tablet Commonly known as:  TOPROL-XL Take 12.5 mg by mouth 2 (two) times daily.   nitroGLYCERIN 0.4 MG SL tablet Commonly known as:  NITROSTAT Place 0.4 mg under the tongue every 5 (five) minutes as needed for chest pain.   ondansetron 4 MG tablet Commonly known as:  ZOFRAN Take 4 mg by mouth every 6 (six) hours.   oxycodone 5 MG capsule Commonly known as:  OXY-IR Take one and 1/2 tablet (7.5mg ) by mouth every 6 hours as needed for pain   senna 8.6 MG tablet Commonly known as:  SENOKOT Take 2 tablets by mouth 2 (two) times daily.   tamsulosin 0.4 MG Caps capsule Commonly known as:  FLOMAX Take 0.4 mg by mouth daily.       Immunizations: Immunization History  Administered Date(s) Administered  . PPD Test 10/12/2015, 10/29/2015     Physical Exam: Vitals:   01/03/17 1116  BP: 137/61  Pulse: 60  Resp: 18  Temp: 97.2 F (36.2 C)  TempSrc: Oral  SpO2: 96%  Weight: 210 lb (95.3 kg)  Height: 5\' 11"  (1.803 m)   Body mass index is 29.29 kg/m.  General- elderly male, overweight, in no acute distress Head- normocephalic, atraumatic Nose-  no nasal discharge Throat- moist mucus membrane, poor dentition  Eyes- PERRLA, EOMI, no pallor, no icterus, no discharge, normal conjunctiva, normal sclera Neck- no cervical lymphadenopathy Cardiovascular- normal s1,s2, no murmur,trace right and 1+ left leg edema which is chronic per patient Respiratory- bilateral clear to auscultation, no wheeze, no rhonchi, no crackles, no use of accessory muscles Abdomen- bowel sounds present, soft, non tender Musculoskeletal- able to move all 4 extremities, lower extremity weakness, limited neck and shoulder ROM Neurological- alert and oriented to person, place and time Skin- warm and dry Psychiatry- normal mood and affect    Labs reviewed: Basic Metabolic Panel:  Recent Labs  10/02/16 10/09/16 12/28/16  NA 142 144 141  K 4.6 3.2* 4.6  BUN  16 18 22*  CREATININE 1.1 1.1 1.1   Liver Function Tests: No results for input(s): AST, ALT, ALKPHOS, BILITOT, PROT, ALBUMIN in the last 8760 hours. No results for input(s): LIPASE, AMYLASE in the last 8760 hours. No results for input(s): AMMONIA in the last 8760 hours. CBC:  Recent Labs  10/02/16 10/09/16 12/28/16  WBC 7.3 5.5 6.7  HGB 13.3* 10.7* 12.6*  HCT 39* 33* 37*  PLT 211 244 216   IMAGING  10/10/15 MRI CERVICAL SPINE    IMPRESSION: 1. Solid anterior fusion at C4-5 and C6-7 2. Moderate foraminal stenosis remains bilaterally at C4-5 secondary to moderate residual uncovertebral disease. 3. Leftward disc osteophyte complex with severe left central and foraminal stenosis at C5-6. Moderate right foraminal narrowing is present as well. 4. Moderate to severe central and moderate foraminal stenosis bilaterally at C3-4, worse on the right. 5. Mild facet hypertrophy bilaterally  at C7-T1 without significant focal protrusion or stenosis.   10/10/15 mri lumbar spine  IMPRESSION: S1 is a transitional vertebra.   L3-4: Shallow bilateral posterior lateral disc protrusions. Narrowing of both lateral recesses that would have some potential to affect the L4 nerve roots.   L4-5: Severe multifactorial spinal stenosis because of endplate osteophytes and circumferential protrusion of disc material with caudal down turning. Facet and ligamentous hypertrophy. Neural compression is likely at this level.   L5-S1: Shallow broad-based disc protrusion more prominent in the right posterior lateral direction. Stenosis of both subarticular lateral recesses and foramina, right more than left. Neural compression is possible this level, particularly affecting the right L5 and S1 nerve roots.      Assessment/Plan  CAD Chest pain fee. Continue plavix with metoprolol succinate and monitor. Continue prn NTG  Peripheral neuropathy Continue gabapentin 1200 mg tid and monitor. Fall  precautions  afib Rate controlled. Continue pacerone with diltiazem 240 mg daily and metoprolol 12.5 mg bid.  Lab Results  Component Value Date   TSH 1.53 12/03/2016   BPH No urinary obstruction. Continue flomax and finasteride.   Allergic rhinitis Continue cetirizine on daily basis, stable symptom   Blanchie Serve, MD Internal Medicine [facility] [address] [place], [place] [address] Cell Phone (Monday-Friday 8 am - 5 pm): [phone] On Call: [phone] and follow prompts after 5 pm and on weekends Office Phone: [phone] Office Fax: [phone]

## 2017-01-13 NOTE — Progress Notes (Signed)
Cardiology Office Note  Date:  01/15/2017   ID:  Joel Wilson., DOB [birth year], MRN [id number]  PCP:  Blanchie Serve, MD   Chief Complaint  Patient presents with  . OTHER    1 yr f/u c/o back and neck pain. Meds reviewed verbally with pt.    HPI:  [name] is a 76 year old man  lives at [facility], status post stenting of the ramus artery in 2003,  hypertension,  admission to the hospital for near syncope found to be in atrial fibrillation, 08/2013 hyperlipidemia   back pain with previous neck surgery significant neuropathy and balance problems, uses a walker and a scooter Who presents for follow up of his atrial fibrillation and CAD  Reports that he has more pain in his legs after pain medication was cut back by pain doctor who goes to [facility] neuropathy, somewhat better with gabapentin, still very uncomfortable Uses a walker, feels he is afall risk given his neuropathy  Denies any recent chest pain concerning for angina No significant tachycardia concerning for atrial fibrillation  He does have lower extremity swelling, does not like to wear compression hose Reports edema improves with leg elevation Minimally active  He was previously on warfarin, this was monitored by nursing facility Last seen by myself in clinic March 2015, over 3 years ago Seen by Ignacia Bayley in January 2017 He previously declined amiodarone surveillancelab work reported this was done at the [facility] in [place]  EKG personally reviewed by myself on todays visit Shows normal sinus rhythm rate 57 bpm nonspecific T wave abnormality  Other past medical history reviewed hospital 08/29/2013 with upper respiratory infection. On Christmas day had tachycardia, worse on 08/28/2013. He had near syncope. In the hospital he was started on amiodarone and converted to normal sinus rhythm. He was febrile on arrival was 100.5 temperature  Carotid ultrasound in the hospital 08/31/2013  showing mild plaque bilaterally less than 50%  Echocardiogram 08/29/2013 showing normal ejection fraction greater than 60%, mild LVH, moderate mitral valve calcification   Prior cardiac catheterization  which showed no evidence of obstructive coronary artery disease. The stent in the ramus was patent with mild instent restenosis. There was a mild stenosis in the proximal LAD and ejection fraction was normal.    PMH:   has a past medical history of BPH without obstruction/lower urinary tract symptoms; Coronary artery disease; Hereditary and idiopathic peripheral neuropathy; Hyperlipidemia; Hypertensive heart disease; Insomnia; Paroxysmal atrial fibrillation ([place]); and Vitamin D deficiency.  PSH:    Past Surgical History:  Procedure Laterality Date  . CARDIAC CATHETERIZATION  11/13/12   [facility]; EF >55%  . CARDIAC CATHETERIZATION  2003  . JOINT REPLACEMENT     left hip  . NECK SURGERY      Current Outpatient Prescriptions  Medication Sig Dispense Refill  . acetaminophen (TYLENOL) 325 MG tablet Take 650 mg by mouth 4 (four) times daily.    Marland Kitchen amiodarone (PACERONE) 200 MG tablet Take 1 tablet (200 mg total) by mouth at bedtime. 90 tablet 3  . Calcium Carbonate-Vitamin D (CALCIUM 600+D) 600-400 MG-UNIT tablet Take 1 tablet by mouth daily. Reported on 03/23/2016    . cetirizine (ZYRTEC) 10 MG tablet Take 10 mg by mouth daily.    . chlorhexidine (PERIDEX) 0.12 % solution Use as directed 10 mLs in the mouth or throat 3 (three) times daily. Rinse swish and spit    . cholecalciferol (VITAMIN D) 1000 UNITS tablet Take 1,000 Units by  mouth daily.    . clopidogrel (PLAVIX) 75 MG tablet Take 75 mg by mouth daily.    Marland Kitchen diltiazem (DILACOR XR) 240 MG 24 hr capsule Take 240 mg by mouth at bedtime. Hold if SBP < 110 or pulse < 60    . finasteride (PROSCAR) 5 MG tablet Take 5 mg by mouth daily.     Marland Kitchen gabapentin (NEURONTIN) 600 MG tablet Take 1,200 mg by mouth 3 (three) times daily.     Marland Kitchen lisinopril  (PRINIVIL,ZESTRIL) 10 MG tablet Take 10 mg by mouth daily.    . magnesium hydroxide (MILK OF MAGNESIA) 400 MG/5ML suspension Take 30 mLs by mouth daily as needed for mild constipation or moderate constipation.    . Melatonin 3 MG TABS Take 1 tablet (3 mg total) by mouth at bedtime. 30 tablet 0  . metoprolol succinate (TOPROL-XL) 25 MG 24 hr tablet Take 12.5 mg by mouth 2 (two) times daily.    . nitroGLYCERIN (NITROSTAT) 0.4 MG SL tablet Wilson 0.4 mg under the tongue every 5 (five) minutes as needed for chest pain.    Marland Kitchen ondansetron (ZOFRAN) 4 MG tablet Take 4 mg by mouth every 6 (six) hours.    Marland Kitchen oxycodone (OXY-IR) 5 MG capsule Take one and 1/2 tablet (7.5mg ) by mouth every 6 hours as needed for pain 180 capsule 0  . senna (SENOKOT) 8.6 MG tablet Take 2 tablets by mouth 2 (two) times daily.     . tamsulosin (FLOMAX) 0.4 MG CAPS Take 0.4 mg by mouth daily.      No current facility-administered medications for this visit.      Allergies:   Aspirin   Social History:  The patient  reports that he has never smoked. He has never used smokeless tobacco. He reports that he does not drink alcohol or use drugs.   Family History:   family history is not on file.    Review of Systems: Review of Systems  Constitutional: Negative.   Respiratory: Positive for shortness of breath.   Cardiovascular: Negative.   Gastrointestinal: Negative.   Musculoskeletal: Positive for joint pain.       Leg weakness, leg pain  Neurological: Negative.   Psychiatric/Behavioral: Negative.   All other systems reviewed and are negative.    PHYSICAL EXAM: VS:  BP 140/62 (BP Location: Right Arm, Patient Position: Sitting, Cuff Size: Normal)   Pulse (!) 57   Ht 5\' 10"  (1.778 m)   Wt 216 lb (98 kg)   BMI 30.99 kg/m  , BMI Body mass index is 30.99 kg/m. GEN: Well nourished, well developed, in no acute distress , sitting in a wheelchair HEENT: normal  Neck: no JVD, carotid bruits, or masses Cardiac: RRR; no  murmurs, rubs, or gallops,1+ bilateral pitting edema to below the knee Respiratory:  clear to auscultation bilaterally, normal work of breathing GI: soft, nontender, nondistended, + BS MS: no deformity or atrophy  Skin: warm and dry, no rash Neuro:  Strength and sensation are intact Psych: euthymic mood, full affect    Recent Labs: 12/03/2016: TSH 1.53 12/28/2016: BUN 22; Creatinine 1.1; Hemoglobin 12.6; Platelets 216; Potassium 4.6; Sodium 141    Lipid Panel Lab Results  Component Value Date   CHOL 166 10/09/2016   HDL 60 10/09/2016   LDLCALC 60 10/09/2016   TRIG 227 (A) 10/09/2016      Wt Readings from Last 3 Encounters:  01/15/17 216 lb (98 kg)  01/03/17 210 lb (95.3 kg)  12/10/16 210 lb (  95.3 kg)       ASSESSMENT AND PLAN:  Coronary artery disease of native artery of native heart with stable angina pectoris (HCC)  Currently with no symptoms of angina. No further workup at this time. Continue current medication regimen.  Paroxysmal atrial fibrillation (HCC) -  Maintaining normal sinus rhythm No medication changes. Warfarin held several years ago as he was unable to transport to our clinic for INR check. High fall risk  Mixed hyperlipidemia - Plan: EKG 12-Lead Known coronary artery disease. Recommended he start simvastatin 20 mg daily. He was previously on 80 mg daily. Unclear if this was held secondary to interaction with diltiazem or for leg weakness  Hypertensive heart disease without heart failure - Plan: EKG 12-Lead Blood pressure is well controlled on today's visit. No changes made to the medications.  Leg edema Likely dependent edema. He does not want compression hose. Unable to exclude side effect from calcium channel blocker   Total encounter time more than 25 minutes  Greater than 50% was spent in counseling and coordination of care with the patient  Disposition:   F/U  12 months   Orders Placed This Encounter  Procedures  . EKG 12-Lead      Signed, Esmond Plants, M.D., Ph.D. 01/15/2017  [place], [place]

## 2017-01-15 ENCOUNTER — Ambulatory Visit (INDEPENDENT_AMBULATORY_CARE_PROVIDER_SITE_OTHER): Payer: Medicare Other | Admitting: Cardiovascular Disease

## 2017-01-15 ENCOUNTER — Encounter: Payer: Self-pay | Admitting: Cardiovascular Disease

## 2017-01-15 VITALS — BP 140/62 | HR 57 | Ht 70.0 in | Wt 216.0 lb

## 2017-01-15 DIAGNOSIS — I25118 Atherosclerotic heart disease of native coronary artery with other forms of angina pectoris: Secondary | ICD-10-CM

## 2017-01-15 DIAGNOSIS — E782 Mixed hyperlipidemia: Secondary | ICD-10-CM | POA: Diagnosis not present

## 2017-01-15 DIAGNOSIS — I209 Angina pectoris, unspecified: Secondary | ICD-10-CM

## 2017-01-15 DIAGNOSIS — I119 Hypertensive heart disease without heart failure: Secondary | ICD-10-CM

## 2017-01-15 DIAGNOSIS — I480 Paroxysmal atrial fibrillation: Secondary | ICD-10-CM

## 2017-01-15 DIAGNOSIS — I2510 Atherosclerotic heart disease of native coronary artery without angina pectoris: Secondary | ICD-10-CM

## 2017-01-15 MED ORDER — SIMVASTATIN 20 MG PO TABS: 20.0000 mg | ORAL_TABLET | Freq: Every day | ORAL | 3 refills | Status: DC

## 2017-01-15 NOTE — Patient Instructions (Addendum)
Consider wearing compression hose  Medication Instructions:   Please restart simvastatin 20 mg daily for cholesterol, Goal total chol <150  Labwork:  No new labs needed  Testing/Procedures:  No further testing at this time   I recommend watching educational videos on topics of interest to you at:       [email or web address]  Enter code: [email or web address]    Follow-Up: It was a pleasure seeing you in the office today. Please call us if you have new issues that need to be addressed before your next appt.  [phone]  Your physician wants you to follow-up in: 12 months.  You will receive a reminder letter in the mail two months in advance. If you don't receive a letter, please call our office to schedule the follow-up appointment.  If you need a refill on your cardiac medications before your next appointment, please call your pharmacy.

## 2017-01-31 ENCOUNTER — Non-Acute Institutional Stay (SKILLED_NURSING_FACILITY): Payer: Medicare Other

## 2017-01-31 DIAGNOSIS — Z0000 Encounter for general adult medical examination without abnormal findings: Secondary | ICD-10-CM | POA: Diagnosis not present

## 2017-01-31 NOTE — Patient Instructions (Signed)
Joel [facility] , Thank you for taking time to come for your Medicare Wellness Visit. I appreciate your ongoing commitment to your health goals. Please review the following plan we discussed and let me know if I can assist you in the future.   Screening recommendations/referrals: Colonoscopy pt over age 77 Recommended yearly ophthalmology/optometry visit for glaucoma screening and checkup Recommended yearly dental visit for hygiene and checkup  Vaccinations: Influenza vaccine due. Pneumococcal vaccine due, ordered. Tdap vaccine not in records Shingles vaccine not in records  Advanced directives: Need copy for chart  Conditions/risks identified: None  Next appointment: None upcoming  Preventive Care 36 Years and Older, Male Preventive care refers to lifestyle choices and visits with your health care provider that can promote health and wellness. What does preventive care include?  A yearly physical exam. This is also called an annual well check.  Dental exams once or twice a year.  Routine eye exams. Ask your health care provider how often you should have your eyes checked.  Personal lifestyle choices, including:  Daily care of your teeth and gums.  Regular physical activity.  Eating a healthy diet.  Avoiding tobacco and drug use.  Limiting alcohol use.  Practicing safe sex.  Taking low doses of aspirin every day.  Taking vitamin and mineral supplements as recommended by your health care provider. What happens during an annual well check? The services and screenings done by your health care provider during your annual well check will depend on your age, overall health, lifestyle risk factors, and family history of disease. Counseling  Your health care provider may ask you questions about your:  Alcohol use.  Tobacco use.  Drug use.  Emotional well-being.  Home and relationship well-being.  Sexual activity.  Eating habits.  History of falls.  Memory and  ability to understand (cognition).  Work and work Statistician. Screening  You may have the following tests or measurements:  Height, weight, and BMI.  Blood pressure.  Lipid and cholesterol levels. These may be checked every 5 years, or more frequently if you are over 32 years old.  Skin check.  Lung cancer screening. You may have this screening every year starting at age 35 if you have a 30-pack-year history of smoking and currently smoke or have quit within the past 15 years.  Fecal occult blood test (FOBT) of the stool. You may have this test every year starting at age 31.  Flexible sigmoidoscopy or colonoscopy. You may have a sigmoidoscopy every 5 years or a colonoscopy every 10 years starting at age 4.  Prostate cancer screening. Recommendations will vary depending on your family history and other risks.  Hepatitis C blood test.  Hepatitis B blood test.  Sexually transmitted disease (STD) testing.  Diabetes screening. This is done by checking your blood sugar (glucose) after you have not eaten for a while (fasting). You may have this done every 1-3 years.  Abdominal aortic aneurysm (AAA) screening. You may need this if you are a current or former smoker.  Osteoporosis. You may be screened starting at age 77 if you are at high risk. Talk with your health care provider about your test results, treatment options, and if necessary, the need for more tests. Vaccines  Your health care provider may recommend certain vaccines, such as:  Influenza vaccine. This is recommended every year.  Tetanus, diphtheria, and acellular pertussis (Tdap, Td) vaccine. You may need a Td booster every 10 years.  Zoster vaccine. You may need this after  age 47.  Pneumococcal 13-valent conjugate (PCV13) vaccine. One dose is recommended after age 41.  Pneumococcal polysaccharide (PPSV23) vaccine. One dose is recommended after age 1. Talk to your health care provider about which screenings and  vaccines you need and how often you need them. This information is not intended to replace advice given to you by your health care provider. Make sure you discuss any questions you have with your health care provider. Document Released: 09/16/2015 Document Revised: 05/09/2016 Document Reviewed: 06/21/2015 Elsevier Interactive Patient Education  2017 [place] Prevention in the Home Falls can cause injuries. They can happen to people of all ages. There are many things you can do to make your home safe and to help prevent falls. What can I do on the outside of my home?  Regularly fix the edges of walkways and driveways and fix any cracks.  Remove anything that might make you trip as you walk through a door, such as a raised step or threshold.  Trim any bushes or trees on the path to your home.  Use bright outdoor lighting.  Clear any walking paths of anything that might make someone trip, such as rocks or tools.  Regularly check to see if handrails are loose or broken. Make sure that both sides of any steps have handrails.  Any raised decks and porches should have guardrails on the edges.  Have any leaves, snow, or ice cleared regularly.  Use sand or salt on walking paths during winter.  Clean up any spills in your garage right away. This includes oil or grease spills. What can I do in the bathroom?  Use night lights.  Install grab bars by the toilet and in the tub and shower. Do not use towel bars as grab bars.  Use non-skid mats or decals in the tub or shower.  If you need to sit down in the shower, use a plastic, non-slip stool.  Keep the floor dry. Clean up any water that spills on the floor as soon as it happens.  Remove soap buildup in the tub or shower regularly.  Attach bath mats securely with double-sided non-slip rug tape.  Do not have throw rugs and other things on the floor that can make you trip. What can I do in the bedroom?  Use night  lights.  Make sure that you have a light by your bed that is easy to reach.  Do not use any sheets or blankets that are too big for your bed. They should not hang down onto the floor.  Have a firm chair that has side arms. You can use this for support while you get dressed.  Do not have throw rugs and other things on the floor that can make you trip. What can I do in the kitchen?  Clean up any spills right away.  Avoid walking on wet floors.  Keep items that you use a lot in easy-to-reach places.  If you need to reach something above you, use a strong step stool that has a grab bar.  Keep electrical cords out of the way.  Do not use floor polish or wax that makes floors slippery. If you must use wax, use non-skid floor wax.  Do not have throw rugs and other things on the floor that can make you trip. What can I do with my stairs?  Do not leave any items on the stairs.  Make sure that there are handrails on both sides of the stairs  and use them. Fix handrails that are broken or loose. Make sure that handrails are as long as the stairways.  Check any carpeting to make sure that it is firmly attached to the stairs. Fix any carpet that is loose or worn.  Avoid having throw rugs at the top or bottom of the stairs. If you do have throw rugs, attach them to the floor with carpet tape.  Make sure that you have a light switch at the top of the stairs and the bottom of the stairs. If you do not have them, ask someone to add them for you. What else can I do to help prevent falls?  Wear shoes that:  Do not have high heels.  Have rubber bottoms.  Are comfortable and fit you well.  Are closed at the toe. Do not wear sandals.  If you use a stepladder:  Make sure that it is fully opened. Do not climb a closed stepladder.  Make sure that both sides of the stepladder are locked into place.  Ask someone to hold it for you, if possible.  Clearly mark and make sure that you can  see:  Any grab bars or handrails.  First and last steps.  Where the edge of each step is.  Use tools that help you move around (mobility aids) if they are needed. These include:  Canes.  Walkers.  Scooters.  Crutches.  Turn on the lights when you go into a dark area. Replace any light bulbs as soon as they burn out.  Set up your furniture so you have a clear path. Avoid moving your furniture around.  If any of your floors are uneven, fix them.  If there are any pets around you, be aware of where they are.  Review your medicines with your doctor. Some medicines can make you feel dizzy. This can increase your chance of falling. Ask your doctor what other things that you can do to help prevent falls. This information is not intended to replace advice given to you by your health care provider. Make sure you discuss any questions you have with your health care provider. Document Released: 06/16/2009 Document Revised: 01/26/2016 Document Reviewed: 09/24/2014 Elsevier Interactive Patient Education  2017 [employer].

## 2017-01-31 NOTE — Progress Notes (Signed)
Subjective:   Joel Wilson. is a 76 y.o. male who presents for an Initial Medicare Annual Wellness Visit at [facility]    Objective:    Today's Vitals   01/31/17 1044  BP: 122/70  Pulse: (!) 56  Temp: 97.9 F (36.6 C)  TempSrc: Oral  SpO2: 96%  Weight: 216 lb (98 kg)  Height: 5\' 10"  (1.778 m)  PainSc: 6    Body mass index is 30.99 kg/m.  Current Medications (verified) Outpatient Encounter Prescriptions as of 01/31/2017  Medication Sig  . acetaminophen (TYLENOL) 325 MG tablet Take 650 mg by mouth 4 (four) times daily.  Marland Kitchen amiodarone (PACERONE) 200 MG tablet Take 1 tablet (200 mg total) by mouth at bedtime.  . Calcium Carbonate-Vitamin D (CALCIUM 600+D) 600-400 MG-UNIT tablet Take 1 tablet by mouth daily. Reported on 03/23/2016  . cetirizine (ZYRTEC) 10 MG tablet Take 10 mg by mouth daily.  . chlorhexidine (PERIDEX) 0.12 % solution Use as directed 10 mLs in the mouth or throat 3 (three) times daily. Rinse swish and spit  . cholecalciferol (VITAMIN D) 1000 UNITS tablet Take 1,000 Units by mouth daily.  . clopidogrel (PLAVIX) 75 MG tablet Take 75 mg by mouth daily.  Marland Kitchen diltiazem (DILACOR XR) 240 MG 24 hr capsule Take 240 mg by mouth at bedtime. Hold if SBP < 110 or pulse < 60  . finasteride (PROSCAR) 5 MG tablet Take 5 mg by mouth daily.   Marland Kitchen gabapentin (NEURONTIN) 600 MG tablet Take 1,200 mg by mouth 3 (three) times daily.   Marland Kitchen lisinopril (PRINIVIL,ZESTRIL) 10 MG tablet Take 10 mg by mouth daily.  . magnesium hydroxide (MILK OF MAGNESIA) 400 MG/5ML suspension Take 30 mLs by mouth daily as needed for mild constipation or moderate constipation.  . Melatonin 3 MG TABS Take 1 tablet (3 mg total) by mouth at bedtime.  . metoprolol succinate (TOPROL-XL) 25 MG 24 hr tablet Take 12.5 mg by mouth 2 (two) times daily.  . nitroGLYCERIN (NITROSTAT) 0.4 MG SL tablet Place 0.4 mg under the tongue every 5 (five) minutes as needed for chest pain.  Marland Kitchen ondansetron (ZOFRAN) 4 MG  tablet Take 4 mg by mouth every 6 (six) hours.  Marland Kitchen oxycodone (OXY-IR) 5 MG capsule Take one and 1/2 tablet (7.5mg ) by mouth every 6 hours as needed for pain  . senna (SENOKOT) 8.6 MG tablet Take 2 tablets by mouth 2 (two) times daily.   . simvastatin (ZOCOR) 20 MG tablet Take 1 tablet (20 mg total) by mouth at bedtime.  . tamsulosin (FLOMAX) 0.4 MG CAPS Take 0.4 mg by mouth daily.    No facility-administered encounter medications on file as of 01/31/2017.     Allergies (verified) Aspirin   History: Past Medical History:  Diagnosis Date  . 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 Coronary artery disease    a. 2003 PCI to Ramus;  b. 11/2012 Cath: LM nl, LAD 30, LCX nl, RI 40 ISR, RCA nl.  . Hereditary and idiopathic peripheral neuropathy   . Hyperlipidemia   . Hypertensive heart disease    a. 08/2013 Echo: EF 70-75%, mild LVH.  Marland Kitchen Insomnia   . Paroxysmal atrial fibrillation (HCC)    a.08/2013-->amio;  b. CHA2DS2VASc = 4-->no longer on coumadin.  . Vitamin D deficiency    Past Surgical History:  Procedure Laterality Date  . CARDIAC CATHETERIZATION  11/13/12   [facility]; EF >55%  . CARDIAC CATHETERIZATION  2003  .  JOINT REPLACEMENT     left hip  . NECK SURGERY     Family History  Problem Relation Age of Onset  . Diabetes Neg Hx    Social History   Occupational History  . Not on file.   Social History Main Topics  . Smoking status: Former Smoker    Packs/day: 0.50    Years: 4.00  . Smokeless tobacco: Never Used  . Alcohol use No  . Drug use: No  . Sexual activity: Not on file   Tobacco Counseling Counseling given: Not Answered   Activities of Daily Living In your present state of health, do you have any difficulty performing the following activities: 01/31/2017  Hearing? N  Vision? N  Difficulty concentrating or making decisions? N  Walking or climbing stairs? Y  Dressing or bathing? N  Doing errands, shopping? Y  Preparing Food and eating ? Y  Using  the Toilet? N  In the past six months, have you accidently leaked urine? N  Do you have problems with loss of bowel control? N  Managing your Medications? Y  Managing your Finances? Y  Housekeeping or managing your Housekeeping? Y  Some recent data might be hidden    Immunizations and Health Maintenance Immunization History  Administered Date(s) Administered  . PPD Test 10/12/2015, 10/29/2015   There are no preventive care reminders to display for this patient.  Patient Care Team: Blanchie Serve, MD as PCP - General (Internal Medicine)  Indicate any recent Medical Services you may have received from other than [facility] providers in the past year (date may be approximate).    Assessment:   This is a routine wellness examination for Joel Wilson.   Hearing/Vision screen No exam data present  Dietary issues and exercise activities discussed: Current Exercise Habits: The patient does not participate in regular exercise at present, Exercise limited by: None identified  Goals    . Maintain Lifestyle          Starting today pt will maintain lifestyle.       Depression Screen PHQ 2/9 Scores 01/31/2017  PHQ - 2 Score 0    Fall Risk Fall Risk  01/31/2017  Falls in the past year? Yes  Number falls in past yr: 2 or more  Injury with Fall? No    Cognitive Function:     6CIT Screen 01/31/2017  What Year? 0 points  What month? 0 points  What time? 0 points  Count back from 20 0 points  Months in reverse 0 points  Repeat phrase 10 points  Total Score 10    Screening Tests Health Maintenance  Topic Date Due  . INFLUENZA VACCINE  09/03/2017 (Originally 04/03/2017)  . TETANUS/TDAP  09/04/2023 (Originally 05/27/1960)  . PNA vac Low Risk Adult (1 of 2 - PCV13) 09/04/2023 (Originally 05/26/2005)        Plan:    I have personally reviewed and addressed the Medicare Annual Wellness questionnaire and have noted the following in the patient's chart:  A. Medical and social  history B. Use of alcohol, tobacco or illicit drugs  C. Current medications and supplements D. Functional ability and status E.  Nutritional status F.  Physical activity G. Advance directives H. List of other physicians I.  Hospitalizations, surgeries, and ER visits in previous 12 months J.  [place]arc to include hearing, vision, cognitive, depression L. Referrals and appointments - none  In addition, I have reviewed and discussed with patient certain preventive protocols, quality metrics, and  best practice recommendations. A written personalized care plan for preventive services as well as general preventive health recommendations were provided to patient.  See attached scanned questionnaire for additional information.   Signed,   Rich Reining, RN Nurse Health Advisor   Quick Notes   Health Maintenance: PNA 13 ordered. Flu due when available.     Abnormal Screen: 6 CIT-10     Patient Concerns: None     Nurse Concerns: None

## 2017-03-15 DIAGNOSIS — I2510 Atherosclerotic heart disease of native coronary artery without angina pectoris: Secondary | ICD-10-CM | POA: Diagnosis not present

## 2017-03-15 DIAGNOSIS — Z7409 Other reduced mobility: Secondary | ICD-10-CM | POA: Diagnosis not present

## 2017-03-15 DIAGNOSIS — G8929 Other chronic pain: Secondary | ICD-10-CM | POA: Diagnosis not present

## 2017-03-15 DIAGNOSIS — N400 Enlarged prostate without lower urinary tract symptoms: Secondary | ICD-10-CM | POA: Diagnosis not present

## 2017-03-29 DIAGNOSIS — Z79899 Other long term (current) drug therapy: Secondary | ICD-10-CM | POA: Diagnosis not present

## 2017-04-16 DIAGNOSIS — M6281 Muscle weakness (generalized): Secondary | ICD-10-CM | POA: Diagnosis not present

## 2017-04-24 DIAGNOSIS — G894 Chronic pain syndrome: Secondary | ICD-10-CM | POA: Diagnosis not present

## 2017-05-17 DIAGNOSIS — G894 Chronic pain syndrome: Secondary | ICD-10-CM | POA: Diagnosis not present

## 2017-05-31 DIAGNOSIS — I2510 Atherosclerotic heart disease of native coronary artery without angina pectoris: Secondary | ICD-10-CM | POA: Diagnosis not present

## 2017-05-31 DIAGNOSIS — G8929 Other chronic pain: Secondary | ICD-10-CM | POA: Diagnosis not present

## 2017-05-31 DIAGNOSIS — I480 Paroxysmal atrial fibrillation: Secondary | ICD-10-CM | POA: Diagnosis not present

## 2017-05-31 DIAGNOSIS — Z9181 History of falling: Secondary | ICD-10-CM | POA: Diagnosis not present

## 2017-06-07 DIAGNOSIS — M542 Cervicalgia: Secondary | ICD-10-CM | POA: Diagnosis not present

## 2017-06-10 DIAGNOSIS — I10 Essential (primary) hypertension: Secondary | ICD-10-CM | POA: Diagnosis not present

## 2017-06-10 DIAGNOSIS — Z79899 Other long term (current) drug therapy: Secondary | ICD-10-CM | POA: Diagnosis not present

## 2017-06-10 DIAGNOSIS — E559 Vitamin D deficiency, unspecified: Secondary | ICD-10-CM | POA: Diagnosis not present

## 2017-06-27 DIAGNOSIS — I2510 Atherosclerotic heart disease of native coronary artery without angina pectoris: Secondary | ICD-10-CM | POA: Diagnosis not present

## 2017-06-27 DIAGNOSIS — M4802 Spinal stenosis, cervical region: Secondary | ICD-10-CM | POA: Diagnosis not present

## 2017-06-27 DIAGNOSIS — Z7409 Other reduced mobility: Secondary | ICD-10-CM | POA: Diagnosis not present

## 2017-06-27 DIAGNOSIS — G8929 Other chronic pain: Secondary | ICD-10-CM | POA: Diagnosis not present

## 2017-07-01 DIAGNOSIS — Z79899 Other long term (current) drug therapy: Secondary | ICD-10-CM | POA: Diagnosis not present

## 2017-07-15 IMAGING — MR MR LUMBAR SPINE W/O CM
5 series · 34 of 48 positions shown · non-contrast
Comparison: Radiography 07/19/2015

CLINICAL DATA: Severe left leg pain. Unable to walk since this
morning.

EXAM:
MRI LUMBAR SPINE WITHOUT CONTRAST
TECHNIQUE: Multiplanar, multisequence MR imaging of the lumbar spine was
performed. No intravenous contrast was administered.

[Series 3: T2 · sagittal · 4.0mm · 0.81mm/px · 6 of 15 slices shown (1 of 2)]
[im 1/15]
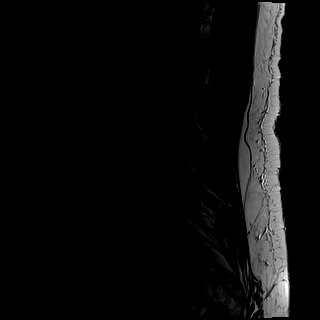
[im 3/15]
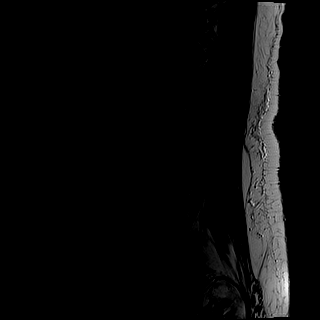
[im 6/15]
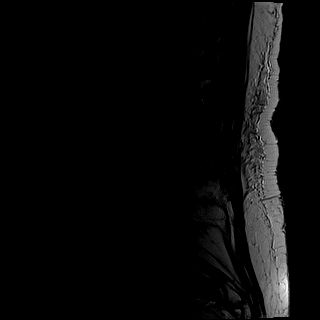
[im 9/15]
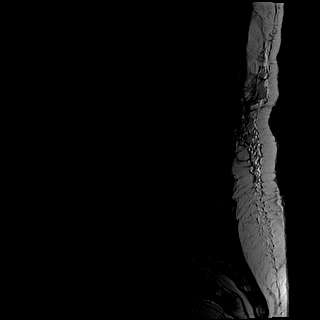
[im 12/15]
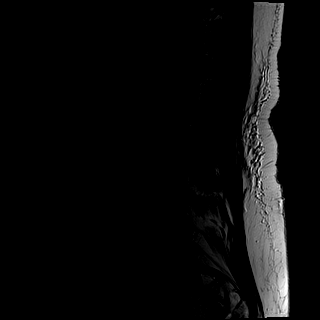
[im 15/15]
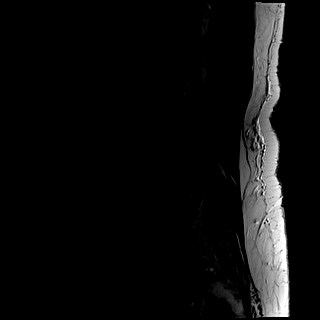

[Series 4: T1 · sagittal · 4.0mm · 0.81mm/px · 5 of 15 slices shown (1 of 2)]
[im 1/15]
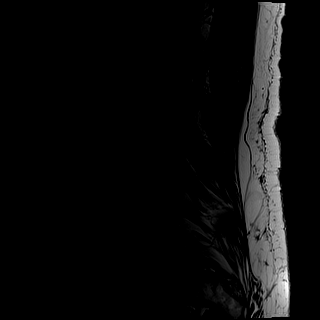
[im 4/15]
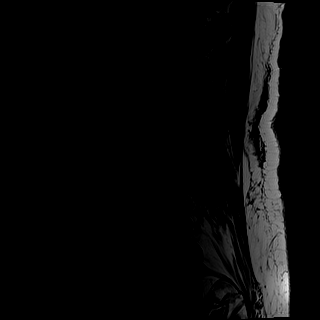
[im 8/15]
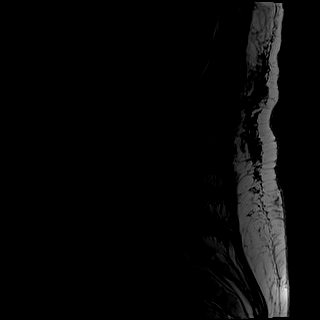
[im 11/15]
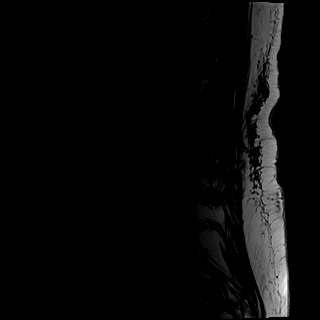
[im 15/15]
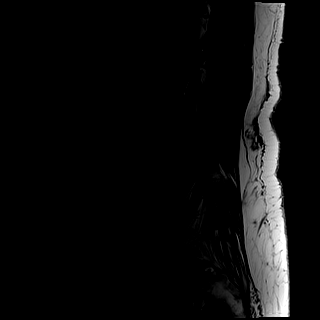

[Series 5: STIR · sagittal · 4.0mm · 1.02mm/px · 3 of 15 slices shown]
[im 1/15]
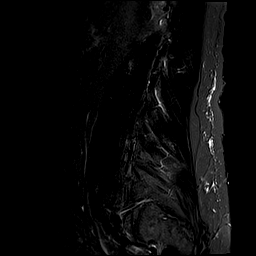
[im 4/15]
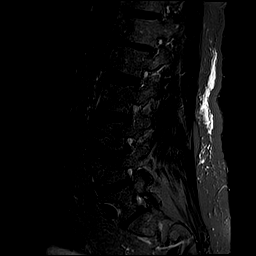
[im 8/15]
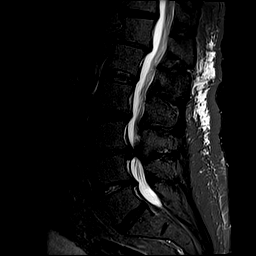

[Series 6: T2 · axial · 4.0mm · 0.78mm/px · z∈[-233,+8]mm · 10 of 43 slices shown (2 of 2)]
[im 3/43]
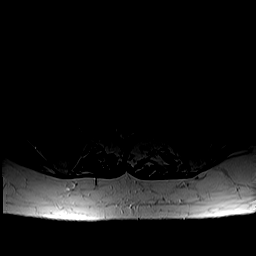
[im 6/43]
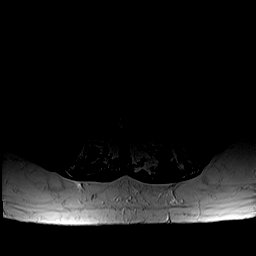
[im 9/43]
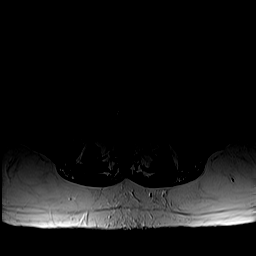
[im 15/43]
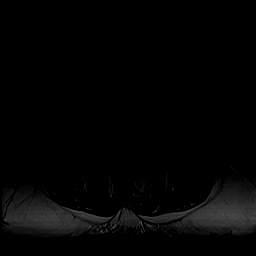
[im 20/43]
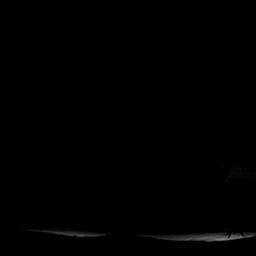
[im 23/43]
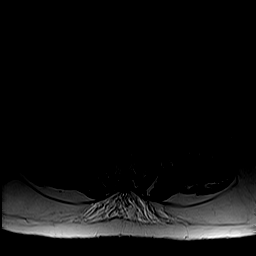
[im 26/43]
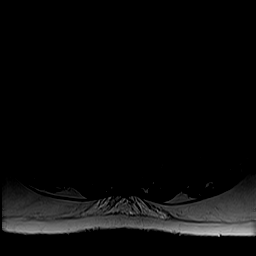
[im 31/43]
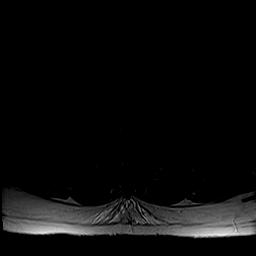
[im 37/43]
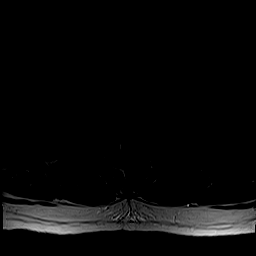
[im 43/43]
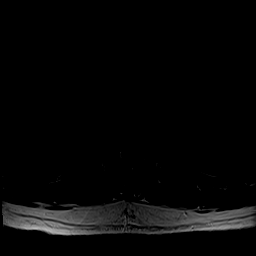

[Series 7: T1 · axial · 4.0mm · 0.39mm/px · z∈[-233,+8]mm · 10 of 43 slices shown (2 of 2)]
[im 3/43]
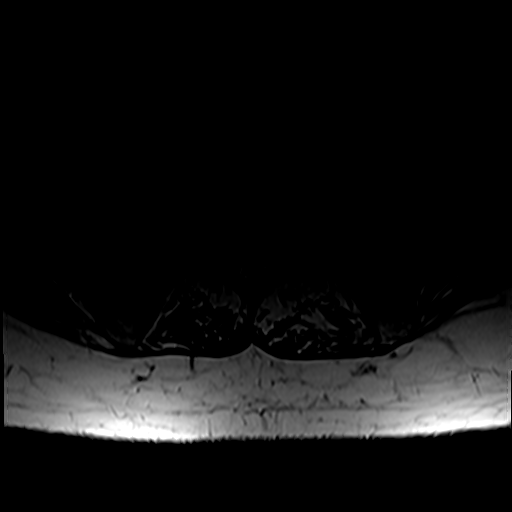
[im 6/43]
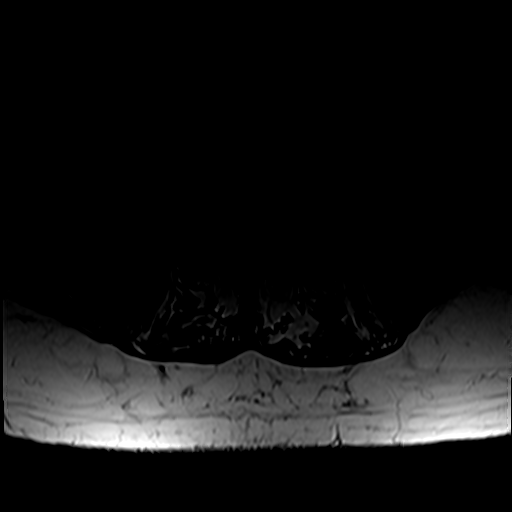
[im 9/43]
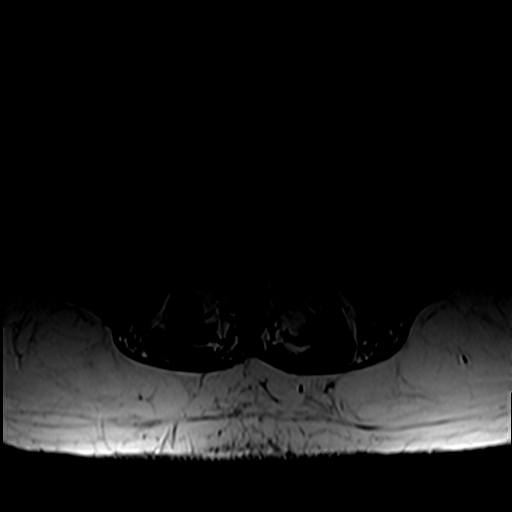
[im 15/43]
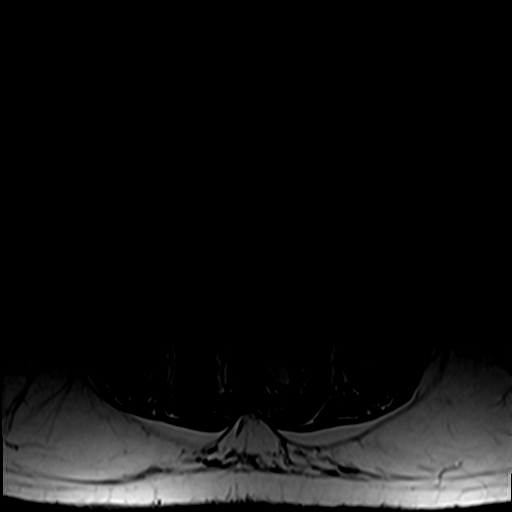
[im 20/43]
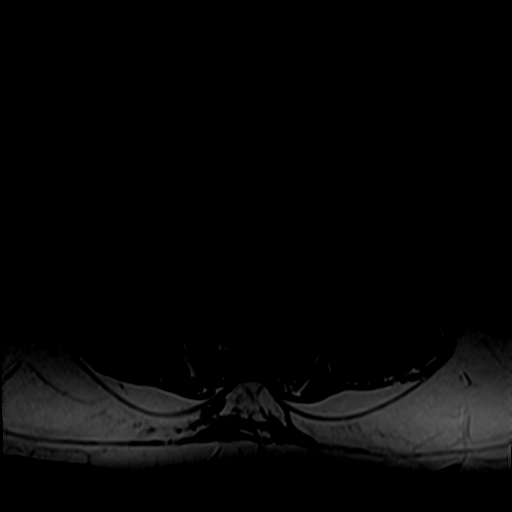
[im 23/43]
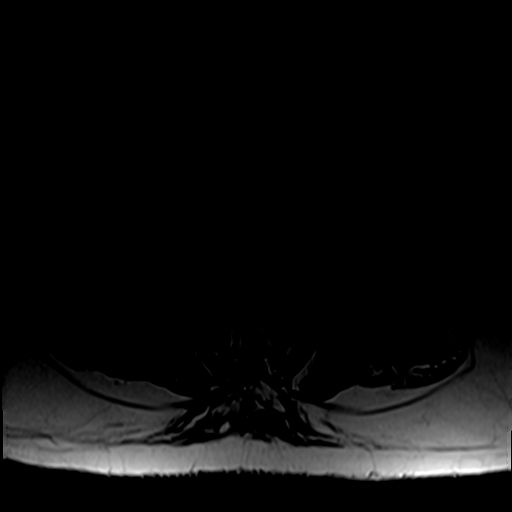
[im 26/43]
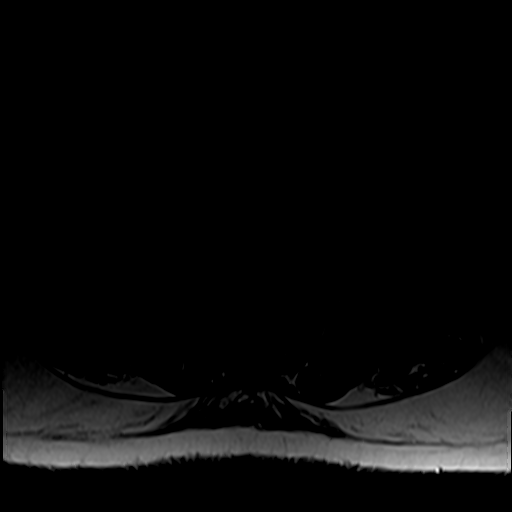
[im 31/43]
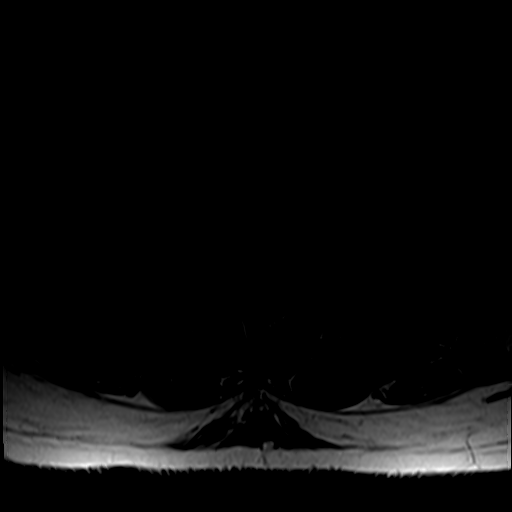
[im 37/43]
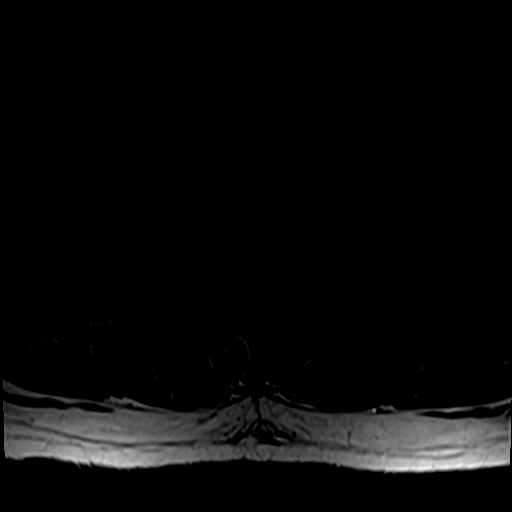
[im 43/43]
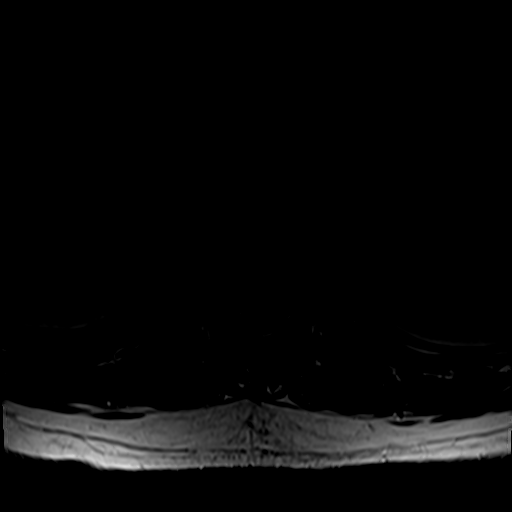

[34 of 48 positions shown; findings below may reference images not displayed]

FINDINGS: Anatomy is transitional. S1 is being described as a transitional
vertebra. Correlation with this numbering scheme would be important
should intervention be contemplated.

T12-L1:  Mild bulging of the disc.  No stenosis.  Conus tip at L1.

L1-2: Endplate osteophytes and mild bulging of the disc. No
significant stenosis.

L2-3:  Normal interspace.

L3-4: Shallow protrusion of disc material and both posterior lateral
directions. Mild stenosis of both lateral recesses. Some potential
to affect the L4 nerve roots in this location.

L4-5: Severe multifactorial stenosis because of endplate osteophytes
and chronic broad-based protrusion of disc material with slight
caudal down turning. Facet and ligamentous hypertrophy. Neural
compression could occur on either side at this level.

L5-S1: Broad-based disc herniation more prominent in the right
posterior lateral direction. Facet and ligamentous hypertrophy.
Stenosis of the subarticular lateral recesses right more than left.
Foraminal narrowing right more than left. Neural compression is
possible at this level, particularly affecting the right L5 and S1
nerve roots.

S1-2: Transitional and normal.
IMPRESSION: S1 is a transitional vertebra.

L3-4: Shallow bilateral posterior lateral disc protrusions.
Narrowing of both lateral recesses that would have some potential to
affect the L4 nerve roots.

L4-5: Severe multifactorial spinal stenosis because of endplate
osteophytes and circumferential protrusion of disc material with
caudal down turning. Facet and ligamentous hypertrophy. Neural
compression is likely at this level.

L5-S1: Shallow broad-based disc protrusion more prominent in the
right posterior lateral direction. Stenosis of both subarticular
lateral recesses and foramina, right more than left. Neural
compression is possible this level, particularly affecting the right
L5 and S1 nerve roots.

## 2017-07-15 IMAGING — CT CT HEAD W/O CM
1 series · 16 of 30 positions shown, 20 images · non-contrast
Comparison: Head CT dated 07/19/2015.

CLINICAL DATA: Neck pain and headache, left arm pain and left leg
pain. Weakness.

EXAM:
CT HEAD WITHOUT CONTRAST
TECHNIQUE: Contiguous axial images were obtained from the base of the skull
through the vertex without intravenous contrast.

[Series 2: head wo · axial · 0.47mm/px · z∈[-106,+28]mm · 16 of 31 slices shown, 20 images]
[im 2/31  brain]
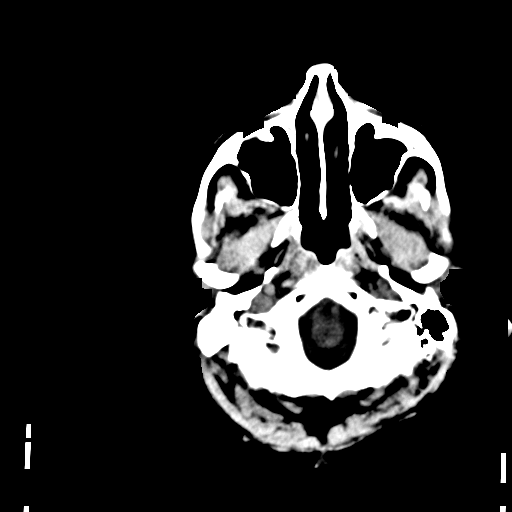
[im 2/31  bone]
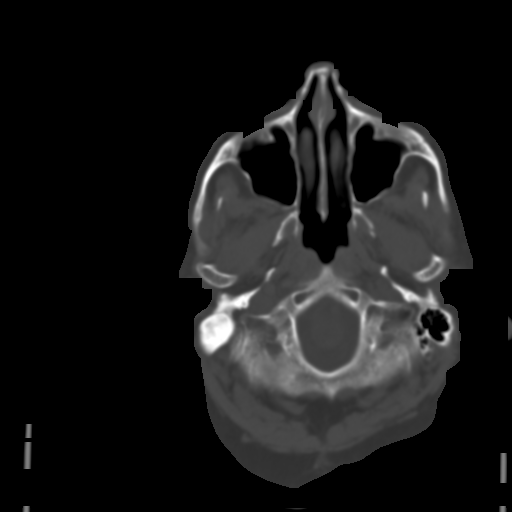
[im 4/31  brain]
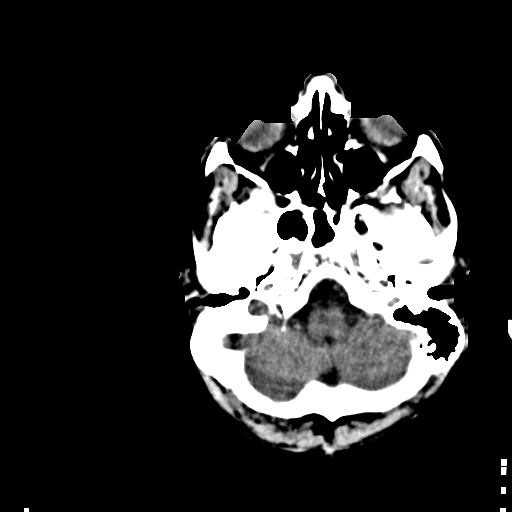
[im 6/31  brain]
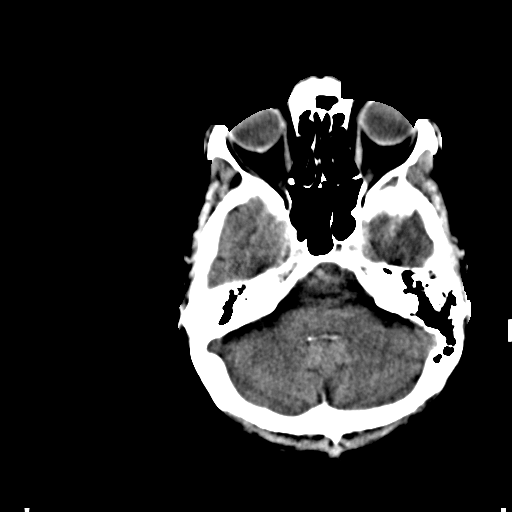
[im 8/31  brain]
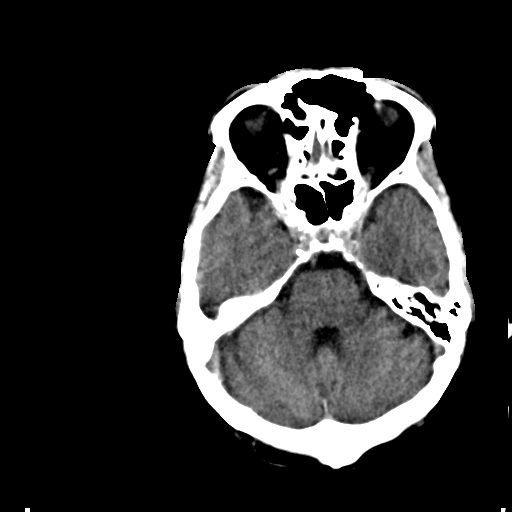
[im 9/31  brain]
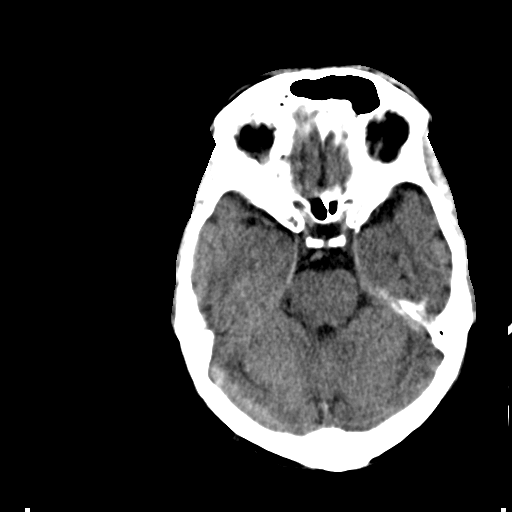
[im 9/31  bone]
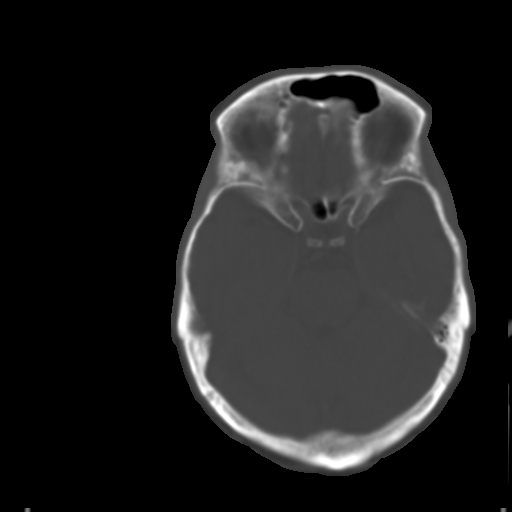
[im 11/31  brain]
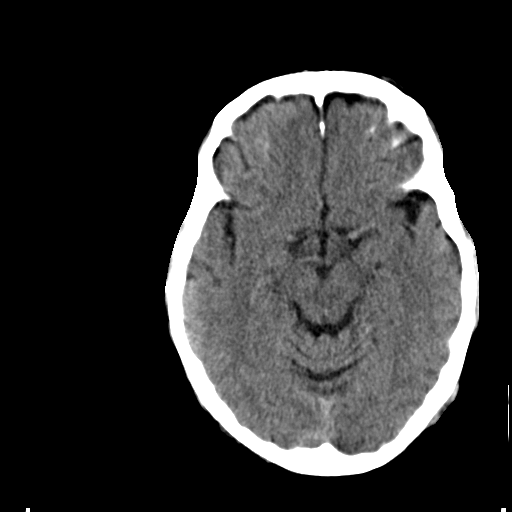
[im 13/31  brain]
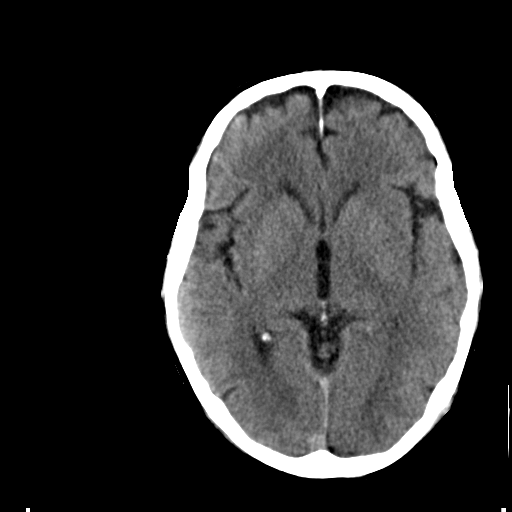
[im 15/31  brain]
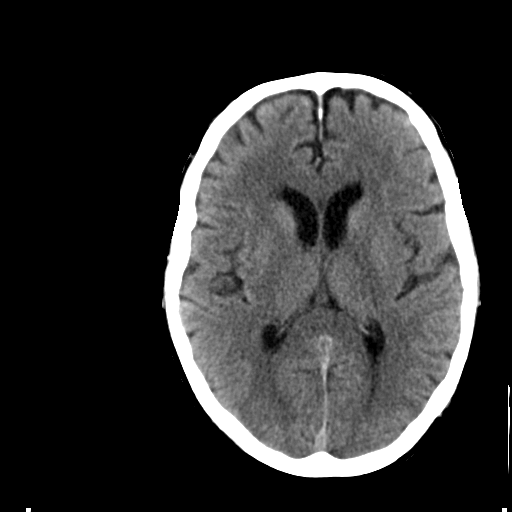
[im 16/31  brain]
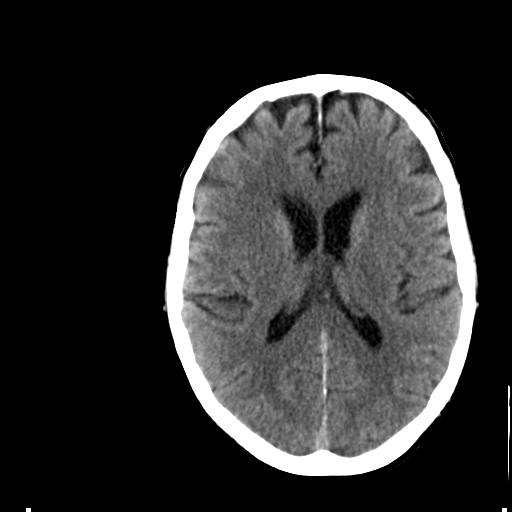
[im 16/31  bone]
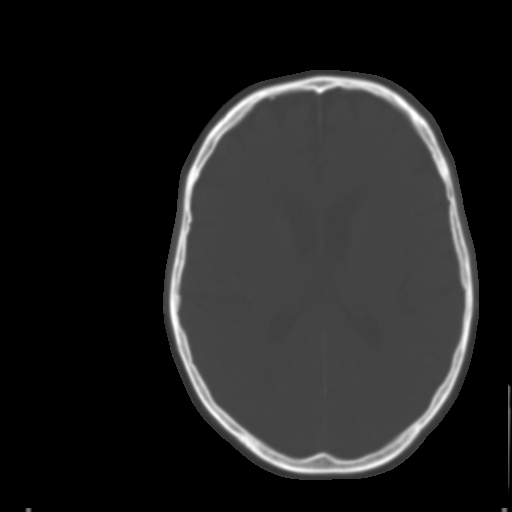
[im 18/31  brain]
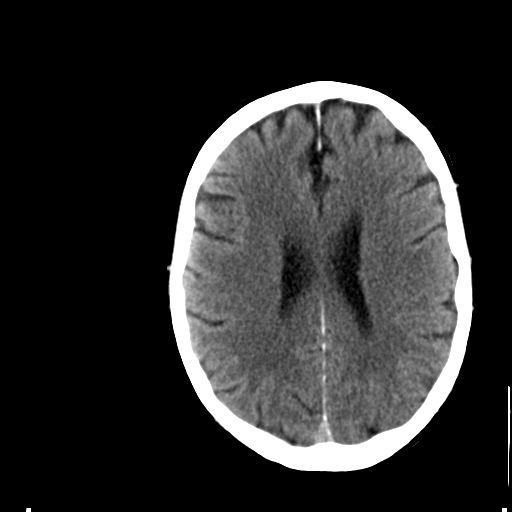
[im 20/31  brain]
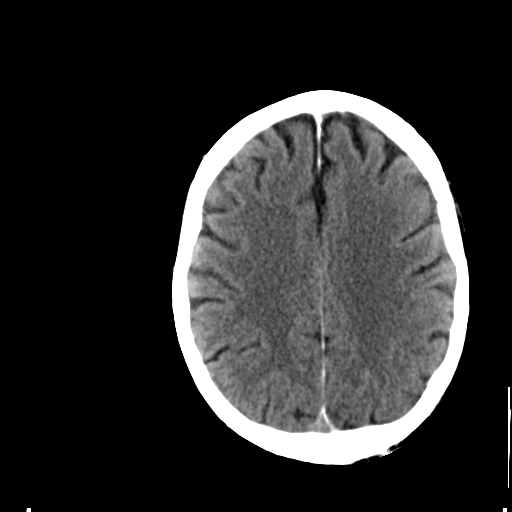
[im 22/31  brain]
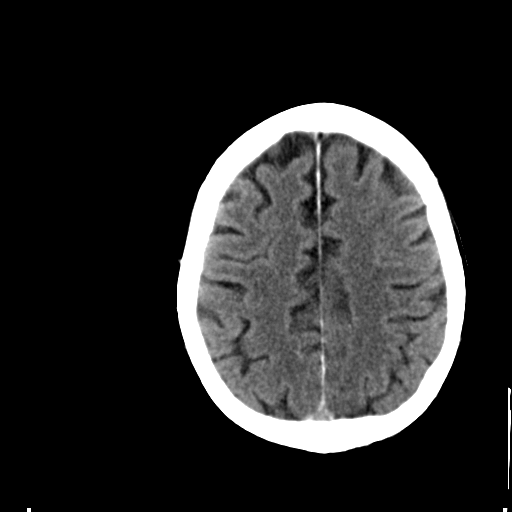
[im 23/31  brain]
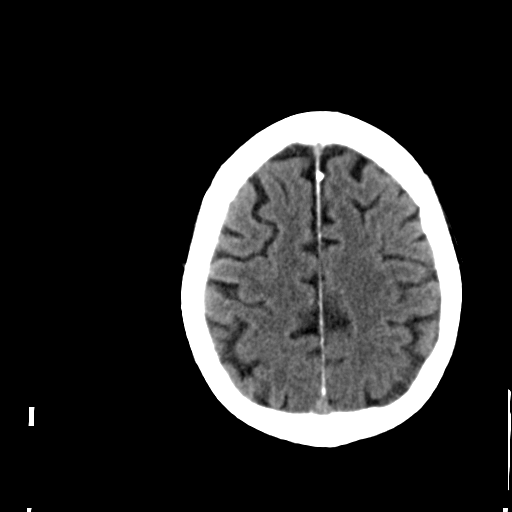
[im 23/31  bone]
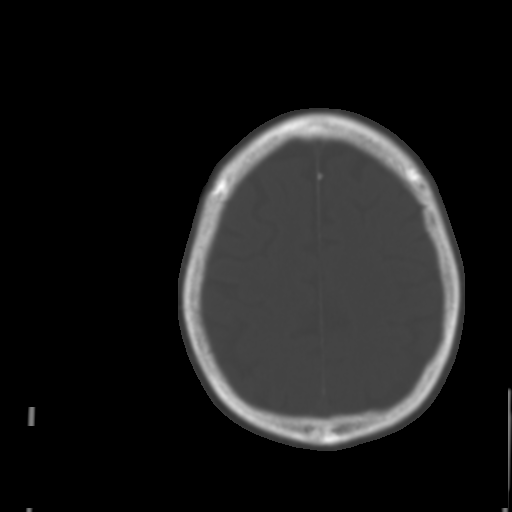
[im 25/31  brain]
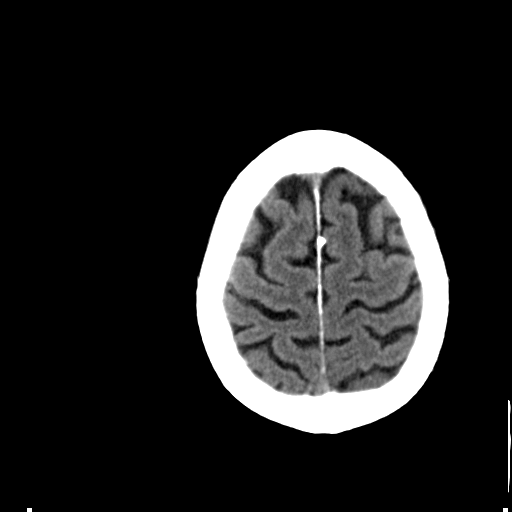
[im 27/31  brain]
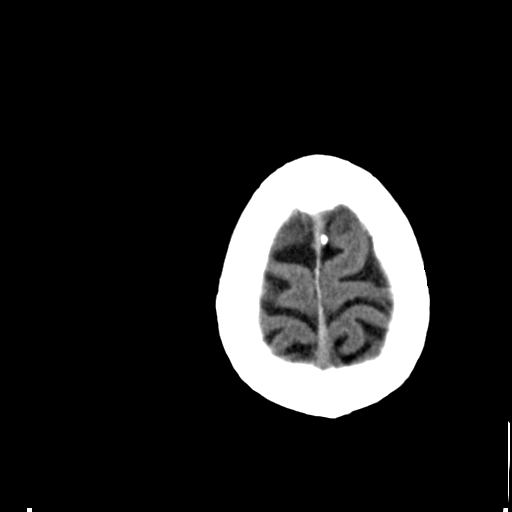
[im 29/31  brain]
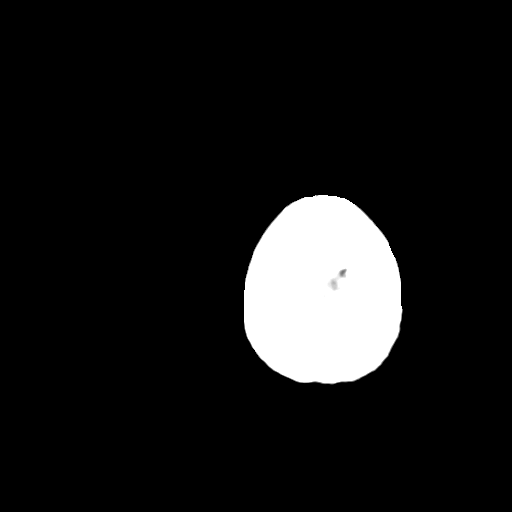

[16 of 30 positions shown; findings below may reference images not displayed]

FINDINGS: Again noted is mild generalized brain atrophy with commensurate
dilatation of the ventricles and sulci. Tiny old lacunar infarct
again noted within the left basal ganglia. Minimal chronic small
vessel ischemic change again appreciated within the deep
periventricular white matter.

There is no mass, hemorrhage, edema or other evidence of acute
parenchymal abnormality. No extra-axial hemorrhage.

Osseous structures are unremarkable. Visualized upper paranasal
sinuses are clear. Superficial soft tissues are unremarkable.
IMPRESSION: No acute findings.  No intracranial mass, hemorrhage or edema.

## 2017-08-14 DIAGNOSIS — I10 Essential (primary) hypertension: Secondary | ICD-10-CM | POA: Diagnosis not present

## 2017-08-14 DIAGNOSIS — K5903 Drug induced constipation: Secondary | ICD-10-CM | POA: Diagnosis not present

## 2017-08-14 DIAGNOSIS — Z79899 Other long term (current) drug therapy: Secondary | ICD-10-CM | POA: Diagnosis not present

## 2017-08-14 DIAGNOSIS — M4726 Other spondylosis with radiculopathy, lumbar region: Secondary | ICD-10-CM | POA: Diagnosis not present

## 2017-08-14 DIAGNOSIS — I498 Other specified cardiac arrhythmias: Secondary | ICD-10-CM | POA: Diagnosis not present

## 2017-08-14 DIAGNOSIS — Z993 Dependence on wheelchair: Secondary | ICD-10-CM | POA: Diagnosis not present

## 2017-08-14 DIAGNOSIS — G8929 Other chronic pain: Secondary | ICD-10-CM | POA: Diagnosis not present

## 2017-08-14 DIAGNOSIS — E538 Deficiency of other specified B group vitamins: Secondary | ICD-10-CM | POA: Diagnosis not present

## 2017-09-20 DIAGNOSIS — M542 Cervicalgia: Secondary | ICD-10-CM | POA: Diagnosis not present

## 2017-09-20 DIAGNOSIS — M6281 Muscle weakness (generalized): Secondary | ICD-10-CM | POA: Diagnosis not present

## 2017-09-20 DIAGNOSIS — R2681 Unsteadiness on feet: Secondary | ICD-10-CM | POA: Diagnosis not present

## 2017-09-20 DIAGNOSIS — R278 Other lack of coordination: Secondary | ICD-10-CM | POA: Diagnosis not present

## 2017-09-24 DIAGNOSIS — M542 Cervicalgia: Secondary | ICD-10-CM | POA: Diagnosis not present

## 2017-09-24 DIAGNOSIS — M6281 Muscle weakness (generalized): Secondary | ICD-10-CM | POA: Diagnosis not present

## 2017-09-24 DIAGNOSIS — R2681 Unsteadiness on feet: Secondary | ICD-10-CM | POA: Diagnosis not present

## 2017-09-24 DIAGNOSIS — R278 Other lack of coordination: Secondary | ICD-10-CM | POA: Diagnosis not present

## 2017-09-26 DIAGNOSIS — R278 Other lack of coordination: Secondary | ICD-10-CM | POA: Diagnosis not present

## 2017-09-26 DIAGNOSIS — M6281 Muscle weakness (generalized): Secondary | ICD-10-CM | POA: Diagnosis not present

## 2017-09-26 DIAGNOSIS — M542 Cervicalgia: Secondary | ICD-10-CM | POA: Diagnosis not present

## 2017-09-26 DIAGNOSIS — R2681 Unsteadiness on feet: Secondary | ICD-10-CM | POA: Diagnosis not present

## 2017-09-27 DIAGNOSIS — R278 Other lack of coordination: Secondary | ICD-10-CM | POA: Diagnosis not present

## 2017-09-27 DIAGNOSIS — M542 Cervicalgia: Secondary | ICD-10-CM | POA: Diagnosis not present

## 2017-09-27 DIAGNOSIS — R2681 Unsteadiness on feet: Secondary | ICD-10-CM | POA: Diagnosis not present

## 2017-09-27 DIAGNOSIS — M6281 Muscle weakness (generalized): Secondary | ICD-10-CM | POA: Diagnosis not present

## 2017-10-01 DIAGNOSIS — M542 Cervicalgia: Secondary | ICD-10-CM | POA: Diagnosis not present

## 2017-10-01 DIAGNOSIS — R278 Other lack of coordination: Secondary | ICD-10-CM | POA: Diagnosis not present

## 2017-10-01 DIAGNOSIS — R2681 Unsteadiness on feet: Secondary | ICD-10-CM | POA: Diagnosis not present

## 2017-10-01 DIAGNOSIS — M6281 Muscle weakness (generalized): Secondary | ICD-10-CM | POA: Diagnosis not present

## 2017-10-02 DIAGNOSIS — R2681 Unsteadiness on feet: Secondary | ICD-10-CM | POA: Diagnosis not present

## 2017-10-02 DIAGNOSIS — M6281 Muscle weakness (generalized): Secondary | ICD-10-CM | POA: Diagnosis not present

## 2017-10-02 DIAGNOSIS — M542 Cervicalgia: Secondary | ICD-10-CM | POA: Diagnosis not present

## 2017-10-02 DIAGNOSIS — R278 Other lack of coordination: Secondary | ICD-10-CM | POA: Diagnosis not present

## 2017-10-03 DIAGNOSIS — R2681 Unsteadiness on feet: Secondary | ICD-10-CM | POA: Diagnosis not present

## 2017-10-03 DIAGNOSIS — R278 Other lack of coordination: Secondary | ICD-10-CM | POA: Diagnosis not present

## 2017-10-03 DIAGNOSIS — M6281 Muscle weakness (generalized): Secondary | ICD-10-CM | POA: Diagnosis not present

## 2017-10-03 DIAGNOSIS — M542 Cervicalgia: Secondary | ICD-10-CM | POA: Diagnosis not present

## 2017-10-04 DIAGNOSIS — M542 Cervicalgia: Secondary | ICD-10-CM | POA: Diagnosis not present

## 2017-10-04 DIAGNOSIS — M6281 Muscle weakness (generalized): Secondary | ICD-10-CM | POA: Diagnosis not present

## 2017-10-04 DIAGNOSIS — R2681 Unsteadiness on feet: Secondary | ICD-10-CM | POA: Diagnosis not present

## 2017-10-04 DIAGNOSIS — R278 Other lack of coordination: Secondary | ICD-10-CM | POA: Diagnosis not present

## 2017-10-07 DIAGNOSIS — L84 Corns and callosities: Secondary | ICD-10-CM | POA: Diagnosis not present

## 2017-10-07 DIAGNOSIS — I739 Peripheral vascular disease, unspecified: Secondary | ICD-10-CM | POA: Diagnosis not present

## 2017-10-07 DIAGNOSIS — B351 Tinea unguium: Secondary | ICD-10-CM | POA: Diagnosis not present

## 2017-10-08 DIAGNOSIS — M542 Cervicalgia: Secondary | ICD-10-CM | POA: Diagnosis not present

## 2017-10-08 DIAGNOSIS — R2681 Unsteadiness on feet: Secondary | ICD-10-CM | POA: Diagnosis not present

## 2017-10-08 DIAGNOSIS — R278 Other lack of coordination: Secondary | ICD-10-CM | POA: Diagnosis not present

## 2017-10-08 DIAGNOSIS — M6281 Muscle weakness (generalized): Secondary | ICD-10-CM | POA: Diagnosis not present

## 2017-10-09 DIAGNOSIS — R278 Other lack of coordination: Secondary | ICD-10-CM | POA: Diagnosis not present

## 2017-10-09 DIAGNOSIS — M6281 Muscle weakness (generalized): Secondary | ICD-10-CM | POA: Diagnosis not present

## 2017-10-09 DIAGNOSIS — M542 Cervicalgia: Secondary | ICD-10-CM | POA: Diagnosis not present

## 2017-10-09 DIAGNOSIS — R2681 Unsteadiness on feet: Secondary | ICD-10-CM | POA: Diagnosis not present

## 2017-10-15 DIAGNOSIS — M6281 Muscle weakness (generalized): Secondary | ICD-10-CM | POA: Diagnosis not present

## 2017-10-15 DIAGNOSIS — R2681 Unsteadiness on feet: Secondary | ICD-10-CM | POA: Diagnosis not present

## 2017-10-15 DIAGNOSIS — M4726 Other spondylosis with radiculopathy, lumbar region: Secondary | ICD-10-CM | POA: Diagnosis not present

## 2017-10-15 DIAGNOSIS — M542 Cervicalgia: Secondary | ICD-10-CM | POA: Diagnosis not present

## 2017-10-15 DIAGNOSIS — G8929 Other chronic pain: Secondary | ICD-10-CM | POA: Diagnosis not present

## 2017-10-15 DIAGNOSIS — R278 Other lack of coordination: Secondary | ICD-10-CM | POA: Diagnosis not present

## 2017-10-17 DIAGNOSIS — M6281 Muscle weakness (generalized): Secondary | ICD-10-CM | POA: Diagnosis not present

## 2017-10-17 DIAGNOSIS — R2681 Unsteadiness on feet: Secondary | ICD-10-CM | POA: Diagnosis not present

## 2017-10-17 DIAGNOSIS — M542 Cervicalgia: Secondary | ICD-10-CM | POA: Diagnosis not present

## 2017-10-17 DIAGNOSIS — R278 Other lack of coordination: Secondary | ICD-10-CM | POA: Diagnosis not present

## 2017-12-03 DIAGNOSIS — G894 Chronic pain syndrome: Secondary | ICD-10-CM | POA: Diagnosis not present

## 2017-12-03 DIAGNOSIS — R269 Unspecified abnormalities of gait and mobility: Secondary | ICD-10-CM | POA: Diagnosis not present

## 2017-12-03 DIAGNOSIS — M5416 Radiculopathy, lumbar region: Secondary | ICD-10-CM | POA: Diagnosis not present

## 2017-12-03 DIAGNOSIS — Z79899 Other long term (current) drug therapy: Secondary | ICD-10-CM | POA: Diagnosis not present

## 2017-12-16 DIAGNOSIS — B351 Tinea unguium: Secondary | ICD-10-CM | POA: Diagnosis not present

## 2017-12-16 DIAGNOSIS — L89611 Pressure ulcer of right heel, stage 1: Secondary | ICD-10-CM | POA: Diagnosis not present

## 2017-12-16 DIAGNOSIS — I739 Peripheral vascular disease, unspecified: Secondary | ICD-10-CM | POA: Diagnosis not present

## 2017-12-16 DIAGNOSIS — L603 Nail dystrophy: Secondary | ICD-10-CM | POA: Diagnosis not present

## 2017-12-16 DIAGNOSIS — L84 Corns and callosities: Secondary | ICD-10-CM | POA: Diagnosis not present

## 2018-04-16 ENCOUNTER — Encounter: Payer: Self-pay | Admitting: Internal Medicine

## 2019-08-10 ENCOUNTER — Emergency Department: Payer: Medicare Other

## 2019-08-10 ENCOUNTER — Inpatient Hospital Stay
Admission: EM | Admit: 2019-08-10 | Discharge: 2019-08-16 | DRG: 193 | Disposition: A | Discharge status: Skilled Nursing Facility | Payer: Medicare Other | Attending: Internal Medicine | Admitting: Internal Medicine

## 2019-08-10 ENCOUNTER — Other Ambulatory Visit: Payer: Self-pay

## 2019-08-10 DIAGNOSIS — J9601 Acute respiratory failure with hypoxia: Secondary | ICD-10-CM | POA: Diagnosis present

## 2019-08-10 DIAGNOSIS — J181 Lobar pneumonia, unspecified organism: Secondary | ICD-10-CM | POA: Diagnosis not present

## 2019-08-10 DIAGNOSIS — L8990 Pressure ulcer of unspecified site, unspecified stage: Secondary | ICD-10-CM | POA: Diagnosis present

## 2019-08-10 DIAGNOSIS — E860 Dehydration: Secondary | ICD-10-CM | POA: Diagnosis present

## 2019-08-10 DIAGNOSIS — I2510 Atherosclerotic heart disease of native coronary artery without angina pectoris: Secondary | ICD-10-CM | POA: Diagnosis present

## 2019-08-10 DIAGNOSIS — Z993 Dependence on wheelchair: Secondary | ICD-10-CM

## 2019-08-10 DIAGNOSIS — N400 Enlarged prostate without lower urinary tract symptoms: Secondary | ICD-10-CM | POA: Diagnosis present

## 2019-08-10 DIAGNOSIS — G894 Chronic pain syndrome: Secondary | ICD-10-CM | POA: Diagnosis present

## 2019-08-10 DIAGNOSIS — Z7902 Long term (current) use of antithrombotics/antiplatelets: Secondary | ICD-10-CM

## 2019-08-10 DIAGNOSIS — Z20828 Contact with and (suspected) exposure to other viral communicable diseases: Secondary | ICD-10-CM | POA: Diagnosis present

## 2019-08-10 DIAGNOSIS — L89629 Pressure ulcer of left heel, unspecified stage: Secondary | ICD-10-CM | POA: Diagnosis present

## 2019-08-10 DIAGNOSIS — Z955 Presence of coronary angioplasty implant and graft: Secondary | ICD-10-CM

## 2019-08-10 DIAGNOSIS — Z87891 Personal history of nicotine dependence: Secondary | ICD-10-CM

## 2019-08-10 DIAGNOSIS — E8770 Fluid overload, unspecified: Secondary | ICD-10-CM | POA: Diagnosis not present

## 2019-08-10 DIAGNOSIS — L89312 Pressure ulcer of right buttock, stage 2: Secondary | ICD-10-CM | POA: Diagnosis present

## 2019-08-10 DIAGNOSIS — I10 Essential (primary) hypertension: Secondary | ICD-10-CM | POA: Diagnosis present

## 2019-08-10 DIAGNOSIS — L89322 Pressure ulcer of left buttock, stage 2: Secondary | ICD-10-CM | POA: Diagnosis present

## 2019-08-10 DIAGNOSIS — R0602 Shortness of breath: Secondary | ICD-10-CM

## 2019-08-10 DIAGNOSIS — R531 Weakness: Principal | ICD-10-CM

## 2019-08-10 DIAGNOSIS — Z96642 Presence of left artificial hip joint: Secondary | ICD-10-CM | POA: Diagnosis present

## 2019-08-10 DIAGNOSIS — K769 Liver disease, unspecified: Secondary | ICD-10-CM | POA: Diagnosis present

## 2019-08-10 DIAGNOSIS — W19XXXA Unspecified fall, initial encounter: Secondary | ICD-10-CM | POA: Diagnosis present

## 2019-08-10 DIAGNOSIS — N1831 Chronic kidney disease, stage 3a: Secondary | ICD-10-CM | POA: Diagnosis present

## 2019-08-10 DIAGNOSIS — M545 Low back pain: Secondary | ICD-10-CM | POA: Diagnosis present

## 2019-08-10 DIAGNOSIS — Z79899 Other long term (current) drug therapy: Secondary | ICD-10-CM

## 2019-08-10 DIAGNOSIS — I480 Paroxysmal atrial fibrillation: Secondary | ICD-10-CM | POA: Diagnosis present

## 2019-08-10 DIAGNOSIS — M6282 Rhabdomyolysis: Secondary | ICD-10-CM | POA: Diagnosis present

## 2019-08-10 DIAGNOSIS — E785 Hyperlipidemia, unspecified: Secondary | ICD-10-CM | POA: Diagnosis present

## 2019-08-10 DIAGNOSIS — L89619 Pressure ulcer of right heel, unspecified stage: Secondary | ICD-10-CM | POA: Diagnosis present

## 2019-08-10 DIAGNOSIS — G609 Hereditary and idiopathic neuropathy, unspecified: Secondary | ICD-10-CM | POA: Diagnosis present

## 2019-08-10 DIAGNOSIS — R6510 Systemic inflammatory response syndrome (SIRS) of non-infectious origin without acute organ dysfunction: Secondary | ICD-10-CM | POA: Diagnosis present

## 2019-08-10 DIAGNOSIS — G4700 Insomnia, unspecified: Secondary | ICD-10-CM | POA: Diagnosis present

## 2019-08-10 DIAGNOSIS — K8020 Calculus of gallbladder without cholecystitis without obstruction: Secondary | ICD-10-CM

## 2019-08-10 DIAGNOSIS — I1310 Hypertensive heart and chronic kidney disease without heart failure, with stage 1 through stage 4 chronic kidney disease, or unspecified chronic kidney disease: Secondary | ICD-10-CM | POA: Diagnosis present

## 2019-08-10 DIAGNOSIS — E559 Vitamin D deficiency, unspecified: Secondary | ICD-10-CM | POA: Diagnosis present

## 2019-08-10 DIAGNOSIS — N179 Acute kidney failure, unspecified: Secondary | ICD-10-CM | POA: Diagnosis present

## 2019-08-10 LAB — TROPONIN I (HIGH SENSITIVITY): Troponin I (High Sensitivity): 15 ng/L (ref <18)

## 2019-08-10 LAB — CBC
HCT: 36.9 % — ABNORMAL LOW (ref 39.0–52.0)
Hemoglobin: 12.1 g/dL — ABNORMAL LOW (ref 13.0–17.0)
MCH: 31.5 pg (ref 26.0–34.0)
MCHC: 32.8 g/dL (ref 30.0–36.0)
MCV: 96.1 fL (ref 80.0–100.0)
Platelets: 217 10*3/uL (ref 150–400)
RBC: 3.84 MIL/uL — ABNORMAL LOW (ref 4.22–5.81)
RDW: 12.6 % (ref 11.5–15.5)
WBC: 14.5 10*3/uL — ABNORMAL HIGH (ref 4.0–10.5)
nRBC: 0 % (ref 0.0–0.2)

## 2019-08-10 LAB — LACTIC ACID, PLASMA: Lactic Acid, Venous: 1.3 mmol/L (ref 0.5–1.9)

## 2019-08-10 LAB — BASIC METABOLIC PANEL
Anion gap: 8 (ref 5–15)
BUN: 37 mg/dL — ABNORMAL HIGH (ref 8–23)
CO2: 26 mmol/L (ref 22–32)
Calcium: 9.3 mg/dL (ref 8.9–10.3)
Chloride: 106 mmol/L (ref 98–111)
Creatinine, Ser: 1.71 mg/dL — ABNORMAL HIGH (ref 0.61–1.24)
GFR calc Af Amer: 43 mL/min — ABNORMAL LOW (ref >60)
GFR calc non Af Amer: 37 mL/min — ABNORMAL LOW (ref >60)
Glucose, Bld: 113 mg/dL — ABNORMAL HIGH (ref 70–99)
Potassium: 3.7 mmol/L (ref 3.5–5.1)
Sodium: 140 mmol/L (ref 135–145)

## 2019-08-10 MED ORDER — SODIUM CHLORIDE 0.9% FLUSH
3.0000 mL | Freq: Once | INTRAVENOUS | Status: AC
  Administered 2019-08-10: 3 mL via INTRAVENOUS

## 2019-08-10 NOTE — ED Triage Notes (Addendum)
Pt to ED via [facility] from [facility]. Per EMS pt has had increasing weakness. EMS states pt tried to get up to pivot out of his wheelchair to use the bathroom last night and wasn't able to and fell on the floor.   Upon arrival pt c/o generalized weakness x32month with new increasing weakness on the left arm and left leg. Pt states he is wheelchair bound andcan usually get up and pivot but no longer able to do so and fell last night. Pt denies hitting his head or LOC. Pt c/o new lower right back pain 10/10.   Pt denies hx of stroke. Pt is on plavix. Pt states last time he took home meds was 12pm.

## 2019-08-10 NOTE — ED Notes (Signed)
Pt oxygen saturation sitting 87-89% on RA. Pt stating he feels a little SOB. Pt placed on 2L via [place] with improvement to 91%. Pt bumped up to 3L on [place] with improvement to 98%. MD made aware. Will continue to monitor.

## 2019-08-10 NOTE — ED Provider Notes (Signed)
[facility] Emergency Department Provider Note ____________________________________________   First MD Initiated Contact with Patient 08/10/19 2158     (approximate)  I have reviewed the triage vital signs and the nursing notes.   HISTORY  Chief Complaint Weakness    HPI Joel Wilson. is a 78 y.o. male with PMH as noted below including hypertension, CAD, paroxysmal atrial fibrillation, and BPH who presents with generalized weakness, gradual onset over the last few days, and characterized by weakness in all of his extremities.  He states that he is unable to stand and gets very shaky.  He states he has difficulty with grip in both hands.  To me, he denied it being worse on one side or the other.  He also has some chest and back pain which developed today.  He denies any significant headache.  He has had no vomiting or diarrhea, and denies urinary symptoms.  Past Medical History:  Diagnosis Date   BPH without obstruction/lower urinary tract symptoms    Coronary artery disease    a. 2003 PCI to Ramus;  b. 11/2012 Cath: LM nl, LAD 30, LCX nl, RI 40 ISR, RCA nl.   Hereditary and idiopathic peripheral neuropathy    Hyperlipidemia    Hypertensive heart disease    a. 08/2013 Echo: EF 70-75%, mild LVH.   Insomnia    Paroxysmal atrial fibrillation (HCC)    a.08/2013-->amio;  b. CHA2DS2VASc = 4-->no longer on coumadin.   Vitamin D deficiency     Patient Active Problem List   Diagnosis Date Noted   Seasonal allergic rhinitis 01/03/2017   Chronic pain syndrome 05/16/2016   Chronic lumbar pain 03/23/2016   Abnormality of gait 12/09/2015   Insomnia 12/09/2015   Vitamin D deficiency 11/08/2015   Peripheral neuropathy 11/08/2015   BPH without obstruction/lower urinary tract symptoms 11/08/2015   Hypertensive heart disease    Paroxysmal atrial fibrillation ([place])    Long term (current) use of anticoagulants 09/07/2013   Atrial  fibrillation ([place]) 09/04/2013   Coronary artery disease    Hyperlipidemia     Past Surgical History:  Procedure Laterality Date   CARDIAC CATHETERIZATION  11/13/12   [facility]; EF >55%   CARDIAC CATHETERIZATION  2003   JOINT REPLACEMENT     left hip   NECK SURGERY      Prior to Admission medications   Medication Sig Start Date End Date Taking? Authorizing Provider  acetaminophen (TYLENOL) 325 MG tablet Take 650 mg by mouth 4 (four) times daily.    [provider]  amiodarone (PACERONE) 200 MG tablet Take 1 tablet (200 mg total) by mouth at bedtime. 09/30/15   Theora Gianotti, NP  Calcium Carbonate-Vitamin D (CALCIUM 600+D) 600-400 MG-UNIT tablet Take 1 tablet by mouth daily. Reported on 03/23/2016    [provider]  cetirizine (ZYRTEC) 10 MG tablet Take 10 mg by mouth daily.    [provider]  chlorhexidine (PERIDEX) 0.12 % solution Use as directed 10 mLs in the mouth or throat 3 (three) times daily. Rinse swish and spit    [provider]  cholecalciferol (VITAMIN D) 1000 UNITS tablet Take 1,000 Units by mouth daily.    [provider]  clopidogrel (PLAVIX) 75 MG tablet Take 75 mg by mouth daily.    [provider]  diltiazem (DILACOR XR) 240 MG 24 hr capsule Take 240 mg by mouth at bedtime. Hold if SBP < 110 or pulse < 60    [provider]  finasteride (PROSCAR) 5 MG tablet Take 5 mg by mouth daily.     [provider]  gabapentin (NEURONTIN) 600 MG tablet Take 1,200 mg by mouth 3 (three) times daily.     [provider]  lisinopril (PRINIVIL,ZESTRIL) 10 MG tablet Take 10 mg by mouth daily.    [provider]  magnesium hydroxide (MILK OF MAGNESIA) 400 MG/5ML suspension Take 30 mLs by mouth daily as needed for mild constipation or moderate constipation.    [provider]  Melatonin 3 MG TABS Take 1 tablet (3 mg total) by mouth at bedtime. 11/08/15   Ngetich, Dinah C, NP    metoprolol succinate (TOPROL-XL) 25 MG 24 hr tablet Take 12.5 mg by mouth 2 (two) times daily.    [provider]  nitroGLYCERIN (NITROSTAT) 0.4 MG SL tablet Place 0.4 mg under the tongue every 5 (five) minutes as needed for chest pain.    [provider]  ondansetron (ZOFRAN) 4 MG tablet Take 4 mg by mouth every 6 (six) hours.    [provider]  oxycodone (OXY-IR) 5 MG capsule Take one and 1/2 tablet (7.5mg ) by mouth every 6 hours as needed for pain 09/13/16   Lauree Chandler, NP  senna (SENOKOT) 8.6 MG tablet Take 2 tablets by mouth 2 (two) times daily.     [provider]  simvastatin (ZOCOR) 20 MG tablet Take 1 tablet (20 mg total) by mouth at bedtime. 01/15/17 04/15/17  Minna Merritts, MD  tamsulosin (FLOMAX) 0.4 MG CAPS Take 0.4 mg by mouth daily.     [provider]    Allergies Aspirin  Family History  Problem Relation Age of Onset   Diabetes Neg Hx     Social History Social History   Tobacco Use   Smoking status: Former Smoker    Packs/day: 0.50    Years: 4.00    Pack years: 2.00   Smokeless tobacco: Never Used  Substance Use Topics   Alcohol use: No   Drug use: No    Review of Systems  Constitutional: No fever.  Positive generalized weakness. Eyes: No visual changes. ENT: No sore throat. Cardiovascular: Positive for chest pain. Respiratory: Denies shortness of breath. Gastrointestinal: No vomiting or diarrhea.  Genitourinary: Negative for dysuria.  Musculoskeletal: Positive for back pain. Skin: Negative for rash. Neurological: Negative for headache.  Positive for weakness to all extremities.  No numbness.   ____________________________________________   PHYSICAL EXAM:  VITAL SIGNS: ED Triage Vitals  Enc Vitals Group     BP 08/10/19 2151 (!) 230/211     Pulse Rate 08/10/19 2151 69     Resp 08/10/19 2151 20     Temp 08/10/19 2155 99 F (37.2 C)     Temp Source 08/10/19 2155 Oral     SpO2  08/10/19 2151 91 %     Weight 08/10/19 2157 220 lb (99.8 kg)     Height 08/10/19 2157 5\' 9"  (1.753 m)     Head Circumference --      Peak Flow --      Pain Score 08/10/19 2156 10     Pain Loc --      Pain Edu? --      Excl. in [place]? --     Constitutional: Alert and oriented.  Somewhat weak appearing but in no acute distress. Eyes: Conjunctivae are normal.  EOMI.  PERRLA. Head: Atraumatic. Nose: No congestion/rhinnorhea. Mouth/Throat: Mucous membranes are moist.   Neck:  Normal range of motion.  Cardiovascular: Normal rate, regular rhythm.  Good peripheral circulation. Respiratory: Normal respiratory effort.  No retractions.  Gastrointestinal: Soft and nontender. No distention.  Genitourinary: No flank tenderness. Musculoskeletal: 2+ bilateral lower extremity edema.  Extremities warm and well perfused.  Neurologic:  Normal speech and language.  Decreased strength bilateral upper and lower extremities, approximately 4 out of 5 in the upper extremities and 3 out of 5 in the lower extremities and symmetric.  No facial droop. Skin:  Skin is warm and dry. No rash noted. Psychiatric: Mood and affect are normal. Speech and behavior are normal.  ____________________________________________   LABS (all labs ordered are listed, but only abnormal results are displayed)  Labs Reviewed  BASIC METABOLIC PANEL - Abnormal; Notable for the following components:      Result Value   Glucose, Bld 113 (*)    BUN 37 (*)    Creatinine, Ser 1.71 (*)    GFR calc non Af Amer 37 (*)    GFR calc Af Amer 43 (*)    All other components within normal limits  CBC - Abnormal; Notable for the following components:   WBC 14.5 (*)    RBC 3.84 (*)    Hemoglobin 12.1 (*)    HCT 36.9 (*)    All other components within normal limits  LACTIC ACID, PLASMA  URINALYSIS, COMPLETE (UACMP) WITH MICROSCOPIC  LACTIC ACID, PLASMA  CBG MONITORING, ED  TROPONIN I (HIGH SENSITIVITY)  TROPONIN I (HIGH SENSITIVITY)    ____________________________________________  EKG  ED ECG REPORT I, Arta Silence, the attending physician, personally viewed and interpreted this ECG.  Date: 08/10/2019 EKG Time: 2154 Rate: 67 Rhythm: normal sinus rhythm QRS Axis: normal Intervals: Nonspecific intraventricular conduction delay ST/T Wave abnormalities: normal Narrative Interpretation: no evidence of acute ischemia  ____________________________________________  RADIOLOGY  CXR: Streaky opacity in the left midlung, possible atelectasis versus infiltrate CT angio chest/abdomen/pelvis: Pending  ____________________________________________   PROCEDURES  Procedure(s) performed: No  Procedures  Critical Care performed: No ____________________________________________   INITIAL IMPRESSION / ASSESSMENT AND PLAN / ED COURSE  Pertinent labs & imaging results that were available during my care of the patient were reviewed by me and considered in my medical decision making (see chart for details).  78 year old male with PMH as noted above presents with worsening generalized weakness over the last several days.  He reports weakness to all of his extremities but especially in his legs, and has difficulty with standing and pivoting from a wheelchair.  He had a fall last night but did not hit his head or have LOC, he has no specific injuries.  On exam, the patient is somewhat chronically weak appearing but in no acute distress.  His vital signs are normal, however his blood pressure is significantly higher in the left upper extremity than in the right, as high as 230/210.  The patient has a bounding left radial pulse, but on the right it feels normal.  On review of systems, the patient does endorse some chest and back pain, but appears comfortable and is not diaphoretic.  He is awake and alert.  He has bilateral upper and lower extremity weakness, worse in the lower extremities but no other focal neurologic  findings.  Overall I suspect most likely systemic etiology such as dehydration, renal insufficiency, other metabolic causes, or infection.  I have a lower suspicion for cardiac etiology.  The patient has no focal findings to suggest acute stroke.  Although the patient's overall  presentation and complaints are not consistent with aortic dissection, given the strongly asymmetrical pulses in the upper extremities and the fact that he does have chest and back pain, I will obtain a CT angiogram dissection protocol to further evaluate.  I anticipate the patient will likely need admission given his difficulty ambulating and the relatively acute nature of the weakness.  ----------------------------------------- 11:40 PM on 08/10/2019 -----------------------------------------  Work-up so far reveals elevated WBC count and slightly elevated creatinine.  Chest x-ray shows some streaky opacities which are consistent with atelectasis versus possible infiltrate although clinically the patient does not have signs or symptoms of pneumonia.  CT angiogram and urinalysis are pending.  I am signing the patient out to the oncoming physician Dr. Joan Mayans.  ____________________________________________   FINAL CLINICAL IMPRESSION(S) / ED DIAGNOSES  Final diagnoses:  Weakness      NEW MEDICATIONS STARTED DURING THIS VISIT:  New Prescriptions   No medications on file     Note:  This document was prepared using Dragon voice recognition software and may include unintentional dictation errors.    Arta Silence, MD 08/10/19 [phone]

## 2019-08-10 NOTE — ED Notes (Signed)
Pt's BP on LA 230/211 (219). Repeat BP on LA 237/200 (211). Blood pressure cuff moved to RA 150/55 (84). MD aware.

## 2019-08-11 ENCOUNTER — Emergency Department: Payer: Medicare Other

## 2019-08-11 ENCOUNTER — Encounter: Payer: Self-pay | Admitting: Radiology

## 2019-08-11 DIAGNOSIS — J9601 Acute respiratory failure with hypoxia: Secondary | ICD-10-CM | POA: Diagnosis present

## 2019-08-11 DIAGNOSIS — G4700 Insomnia, unspecified: Secondary | ICD-10-CM | POA: Diagnosis present

## 2019-08-11 DIAGNOSIS — W19XXXA Unspecified fall, initial encounter: Secondary | ICD-10-CM | POA: Diagnosis not present

## 2019-08-11 DIAGNOSIS — N179 Acute kidney failure, unspecified: Secondary | ICD-10-CM | POA: Diagnosis present

## 2019-08-11 DIAGNOSIS — Z79899 Other long term (current) drug therapy: Secondary | ICD-10-CM | POA: Diagnosis not present

## 2019-08-11 DIAGNOSIS — G609 Hereditary and idiopathic neuropathy, unspecified: Secondary | ICD-10-CM | POA: Diagnosis present

## 2019-08-11 DIAGNOSIS — Z20828 Contact with and (suspected) exposure to other viral communicable diseases: Secondary | ICD-10-CM | POA: Diagnosis present

## 2019-08-11 DIAGNOSIS — E559 Vitamin D deficiency, unspecified: Secondary | ICD-10-CM | POA: Diagnosis present

## 2019-08-11 DIAGNOSIS — K769 Liver disease, unspecified: Secondary | ICD-10-CM | POA: Diagnosis present

## 2019-08-11 DIAGNOSIS — Z7902 Long term (current) use of antithrombotics/antiplatelets: Secondary | ICD-10-CM | POA: Diagnosis not present

## 2019-08-11 DIAGNOSIS — Z87891 Personal history of nicotine dependence: Secondary | ICD-10-CM | POA: Diagnosis not present

## 2019-08-11 DIAGNOSIS — N400 Enlarged prostate without lower urinary tract symptoms: Secondary | ICD-10-CM | POA: Diagnosis present

## 2019-08-11 DIAGNOSIS — Z955 Presence of coronary angioplasty implant and graft: Secondary | ICD-10-CM | POA: Diagnosis not present

## 2019-08-11 DIAGNOSIS — M545 Low back pain: Secondary | ICD-10-CM | POA: Diagnosis present

## 2019-08-11 DIAGNOSIS — W19XXXD Unspecified fall, subsequent encounter: Secondary | ICD-10-CM | POA: Diagnosis not present

## 2019-08-11 DIAGNOSIS — I25118 Atherosclerotic heart disease of native coronary artery with other forms of angina pectoris: Secondary | ICD-10-CM

## 2019-08-11 DIAGNOSIS — I480 Paroxysmal atrial fibrillation: Secondary | ICD-10-CM

## 2019-08-11 DIAGNOSIS — M6282 Rhabdomyolysis: Secondary | ICD-10-CM | POA: Diagnosis present

## 2019-08-11 DIAGNOSIS — R531 Weakness: Secondary | ICD-10-CM

## 2019-08-11 DIAGNOSIS — R6510 Systemic inflammatory response syndrome (SIRS) of non-infectious origin without acute organ dysfunction: Secondary | ICD-10-CM | POA: Diagnosis not present

## 2019-08-11 DIAGNOSIS — Z993 Dependence on wheelchair: Secondary | ICD-10-CM | POA: Diagnosis not present

## 2019-08-11 DIAGNOSIS — N1831 Chronic kidney disease, stage 3a: Secondary | ICD-10-CM | POA: Diagnosis present

## 2019-08-11 DIAGNOSIS — E785 Hyperlipidemia, unspecified: Secondary | ICD-10-CM | POA: Diagnosis present

## 2019-08-11 DIAGNOSIS — I1310 Hypertensive heart and chronic kidney disease without heart failure, with stage 1 through stage 4 chronic kidney disease, or unspecified chronic kidney disease: Secondary | ICD-10-CM | POA: Diagnosis present

## 2019-08-11 DIAGNOSIS — G894 Chronic pain syndrome: Secondary | ICD-10-CM | POA: Diagnosis present

## 2019-08-11 DIAGNOSIS — I2510 Atherosclerotic heart disease of native coronary artery without angina pectoris: Secondary | ICD-10-CM | POA: Diagnosis present

## 2019-08-11 DIAGNOSIS — Z96642 Presence of left artificial hip joint: Secondary | ICD-10-CM | POA: Diagnosis present

## 2019-08-11 DIAGNOSIS — J181 Lobar pneumonia, unspecified organism: Secondary | ICD-10-CM | POA: Diagnosis present

## 2019-08-11 DIAGNOSIS — M6281 Muscle weakness (generalized): Secondary | ICD-10-CM | POA: Diagnosis not present

## 2019-08-11 DIAGNOSIS — I10 Essential (primary) hypertension: Secondary | ICD-10-CM | POA: Diagnosis present

## 2019-08-11 DIAGNOSIS — E860 Dehydration: Secondary | ICD-10-CM | POA: Diagnosis present

## 2019-08-11 LAB — URINALYSIS, COMPLETE (UACMP) WITH MICROSCOPIC
Bacteria, UA: NONE SEEN
Bilirubin Urine: NEGATIVE
Glucose, UA: NEGATIVE mg/dL
Ketones, ur: NEGATIVE mg/dL
Leukocytes,Ua: NEGATIVE
Nitrite: NEGATIVE
Protein, ur: NEGATIVE mg/dL
Specific Gravity, Urine: 1.031 — ABNORMAL HIGH (ref 1.005–1.030)
pH: 5 (ref 5.0–8.0)

## 2019-08-11 LAB — TROPONIN I (HIGH SENSITIVITY): Troponin I (High Sensitivity): 14 ng/L (ref <18)

## 2019-08-11 LAB — BRAIN NATRIURETIC PEPTIDE: B Natriuretic Peptide: 202 pg/mL — ABNORMAL HIGH (ref 0.0–100.0)

## 2019-08-11 LAB — HEPATIC FUNCTION PANEL
ALT: 16 U/L (ref 0–44)
AST: 34 U/L (ref 15–41)
Albumin: 3.9 g/dL (ref 3.5–5.0)
Alkaline Phosphatase: 45 U/L (ref 38–126)
Bilirubin, Direct: 0.2 mg/dL (ref 0.0–0.2)
Indirect Bilirubin: 0.6 mg/dL (ref 0.3–0.9)
Total Bilirubin: 0.8 mg/dL (ref 0.3–1.2)
Total Protein: 7.7 g/dL (ref 6.5–8.1)

## 2019-08-11 LAB — CK: Total CK: 555 U/L — ABNORMAL HIGH (ref 49–397)

## 2019-08-11 LAB — PROCALCITONIN: Procalcitonin: <0.1 ng/mL

## 2019-08-11 LAB — SARS CORONAVIRUS 2 (TAT 6-24 HRS): SARS Coronavirus 2: NEGATIVE

## 2019-08-11 LAB — POC SARS CORONAVIRUS 2 AG -  ED: SARS Coronavirus 2 Ag: NEGATIVE

## 2019-08-11 MED ORDER — SODIUM CHLORIDE 0.9 % IV SOLN
1.0000 g | Freq: Once | INTRAVENOUS | Status: AC
  Administered 2019-08-11: 1 g via INTRAVENOUS
  Filled 2019-08-11: qty 10

## 2019-08-11 MED ORDER — SODIUM CHLORIDE 0.9 % IV BOLUS
500.0000 mL | Freq: Once | INTRAVENOUS | Status: AC
  Administered 2019-08-11: 500 mL via INTRAVENOUS

## 2019-08-11 MED ORDER — LOPERAMIDE HCL 2 MG PO CAPS: 2.0000 mg | ORAL_CAPSULE | ORAL | Status: DC | PRN

## 2019-08-11 MED ORDER — GUAIFENESIN ER 600 MG PO TB12
600.0000 mg | ORAL_TABLET | Freq: Two times a day (BID) | ORAL | Status: DC
  Administered 2019-08-11 – 2019-08-16 (×11): 600 mg via ORAL
  Filled 2019-08-11 (×13): qty 1

## 2019-08-11 MED ORDER — POLYETHYLENE GLYCOL 3350 17 G PO PACK: 17.0000 g | PACK | Freq: Every day | ORAL | Status: DC | PRN

## 2019-08-11 MED ORDER — SIMVASTATIN 20 MG PO TABS
20.0000 mg | ORAL_TABLET | Freq: Every day | ORAL | Status: DC
  Administered 2019-08-11: 23:00:00 20 mg via ORAL
  Filled 2019-08-11: qty 1

## 2019-08-11 MED ORDER — ONDANSETRON HCL 4 MG/2ML IJ SOLN
4.0000 mg | Freq: Three times a day (TID) | INTRAMUSCULAR | Status: DC | PRN
  Administered 2019-08-11 – 2019-08-15 (×5): 4 mg via INTRAVENOUS
  Filled 2019-08-11 (×5): qty 2

## 2019-08-11 MED ORDER — CLOPIDOGREL BISULFATE 75 MG PO TABS
75.0000 mg | ORAL_TABLET | Freq: Every day | ORAL | Status: DC
  Administered 2019-08-11 – 2019-08-16 (×6): 75 mg via ORAL
  Filled 2019-08-11 (×6): qty 1

## 2019-08-11 MED ORDER — GABAPENTIN 600 MG PO TABS
1200.0000 mg | ORAL_TABLET | Freq: Three times a day (TID) | ORAL | Status: DC
  Administered 2019-08-11 – 2019-08-16 (×14): 1200 mg via ORAL
  Filled 2019-08-11 (×14): qty 2

## 2019-08-11 MED ORDER — ACETAMINOPHEN 325 MG PO TABS
650.0000 mg | ORAL_TABLET | Freq: Four times a day (QID) | ORAL | Status: DC | PRN
  Administered 2019-08-12 – 2019-08-16 (×5): 650 mg via ORAL
  Filled 2019-08-11 (×5): qty 2

## 2019-08-11 MED ORDER — HEPARIN SODIUM (PORCINE) 5000 UNIT/ML IJ SOLN
5000.0000 [IU] | Freq: Three times a day (TID) | INTRAMUSCULAR | Status: DC
  Administered 2019-08-11 – 2019-08-12 (×5): 5000 [IU] via SUBCUTANEOUS
  Filled 2019-08-11 (×7): qty 1

## 2019-08-11 MED ORDER — ALUM & MAG HYDROXIDE-SIMETH 200-200-20 MG/5ML PO SUSP: 30.0000 mL | Freq: Four times a day (QID) | ORAL | Status: DC | PRN

## 2019-08-11 MED ORDER — METOPROLOL SUCCINATE ER 25 MG PO TB24
12.5000 mg | ORAL_TABLET | Freq: Two times a day (BID) | ORAL | Status: DC
  Administered 2019-08-11 – 2019-08-16 (×9): 12.5 mg via ORAL
  Filled 2019-08-11 (×10): qty 1

## 2019-08-11 MED ORDER — HYDRALAZINE HCL 50 MG PO TABS
25.0000 mg | ORAL_TABLET | Freq: Three times a day (TID) | ORAL | Status: DC | PRN
  Filled 2019-08-11: qty 1

## 2019-08-11 MED ORDER — OXYCODONE HCL 20 MG PO TABS: 10.0000 mg | ORAL_TABLET | Freq: Three times a day (TID) | ORAL | Status: DC

## 2019-08-11 MED ORDER — AMIODARONE HCL 200 MG PO TABS
200.0000 mg | ORAL_TABLET | Freq: Every day | ORAL | Status: DC
  Administered 2019-08-11 – 2019-08-15 (×5): 200 mg via ORAL
  Filled 2019-08-11 (×5): qty 1

## 2019-08-11 MED ORDER — NITROGLYCERIN 0.4 MG SL SUBL: 0.4000 mg | SUBLINGUAL_TABLET | SUBLINGUAL | Status: DC | PRN

## 2019-08-11 MED ORDER — SODIUM CHLORIDE 0.9 % IV SOLN
1.0000 g | INTRAVENOUS | Status: DC
  Administered 2019-08-12 – 2019-08-16 (×5): 1 g via INTRAVENOUS
  Filled 2019-08-11: qty 10
  Filled 2019-08-11 (×5): qty 1

## 2019-08-11 MED ORDER — DEXTROMETHORPHAN POLISTIREX ER 30 MG/5ML PO SUER
30.0000 mg | Freq: Two times a day (BID) | ORAL | Status: DC
  Administered 2019-08-11 – 2019-08-15 (×9): 30 mg via ORAL
  Filled 2019-08-11 (×15): qty 5

## 2019-08-11 MED ORDER — MELATONIN 5 MG PO TABS
5.0000 mg | ORAL_TABLET | Freq: Every day | ORAL | Status: DC
  Administered 2019-08-11 – 2019-08-15 (×4): 5 mg via ORAL
  Filled 2019-08-11 (×6): qty 1

## 2019-08-11 MED ORDER — CALCIUM CARBONATE-VITAMIN D 500-200 MG-UNIT PO TABS
1.0000 | ORAL_TABLET | Freq: Every day | ORAL | Status: DC
  Administered 2019-08-11 – 2019-08-16 (×6): 1 via ORAL
  Filled 2019-08-11 (×6): qty 1

## 2019-08-11 MED ORDER — TAMSULOSIN HCL 0.4 MG PO CAPS
0.4000 mg | ORAL_CAPSULE | Freq: Every day | ORAL | Status: DC
  Administered 2019-08-11 – 2019-08-16 (×6): 0.4 mg via ORAL
  Filled 2019-08-11 (×6): qty 1

## 2019-08-11 MED ORDER — TRIAMCINOLONE ACETONIDE 0.1 % EX OINT
1.0000 "application " | TOPICAL_OINTMENT | Freq: Two times a day (BID) | CUTANEOUS | Status: DC
  Administered 2019-08-11 – 2019-08-16 (×10): 1 via TOPICAL
  Filled 2019-08-11: qty 15

## 2019-08-11 MED ORDER — DM-GUAIFENESIN ER 30-600 MG PO TB12: 1.0000 | ORAL_TABLET | Freq: Two times a day (BID) | ORAL | Status: DC

## 2019-08-11 MED ORDER — LIDOCAINE 5 % EX PTCH
1.0000 | MEDICATED_PATCH | Freq: Every day | CUTANEOUS | Status: DC
  Administered 2019-08-12 – 2019-08-16 (×5): 1 via TRANSDERMAL
  Filled 2019-08-11 (×6): qty 1

## 2019-08-11 MED ORDER — HYDROCERIN EX CREA
1.0000 "application " | TOPICAL_CREAM | Freq: Every day | CUTANEOUS | Status: DC
  Administered 2019-08-11 – 2019-08-16 (×6): 1 via TOPICAL
  Filled 2019-08-11: qty 113

## 2019-08-11 MED ORDER — ACETAMINOPHEN 650 MG RE SUPP: 650.0000 mg | Freq: Four times a day (QID) | RECTAL | Status: DC | PRN

## 2019-08-11 MED ORDER — VITAMIN D 25 MCG (1000 UNIT) PO TABS
2000.0000 [IU] | ORAL_TABLET | Freq: Every day | ORAL | Status: DC
  Administered 2019-08-11 – 2019-08-13 (×3): 2000 [IU] via ORAL
  Filled 2019-08-11 (×3): qty 2

## 2019-08-11 MED ORDER — DILTIAZEM HCL ER COATED BEADS 120 MG PO CP24
240.0000 mg | ORAL_CAPSULE | Freq: Every day | ORAL | Status: DC
  Administered 2019-08-11 – 2019-08-16 (×6): 240 mg via ORAL
  Filled 2019-08-11 (×6): qty 2

## 2019-08-11 MED ORDER — VITAMIN B-12 1000 MCG PO TABS
1000.0000 ug | ORAL_TABLET | Freq: Every day | ORAL | Status: DC
  Administered 2019-08-12 – 2019-08-16 (×5): 1000 ug via ORAL
  Filled 2019-08-11 (×5): qty 1

## 2019-08-11 MED ORDER — SODIUM CHLORIDE 0.9 % IV SOLN
INTRAVENOUS | Status: DC
  Administered 2019-08-11 – 2019-08-12 (×2): via INTRAVENOUS

## 2019-08-11 MED ORDER — OXYCODONE HCL 5 MG PO TABS
10.0000 mg | ORAL_TABLET | Freq: Three times a day (TID) | ORAL | Status: DC
  Administered 2019-08-11 – 2019-08-12 (×6): 10 mg via ORAL
  Filled 2019-08-11 (×6): qty 2

## 2019-08-11 MED ORDER — LORATADINE 10 MG PO TABS
10.0000 mg | ORAL_TABLET | Freq: Every day | ORAL | Status: DC
  Administered 2019-08-11 – 2019-08-16 (×6): 10 mg via ORAL
  Filled 2019-08-11 (×6): qty 1

## 2019-08-11 MED ORDER — ALBUTEROL SULFATE HFA 108 (90 BASE) MCG/ACT IN AERS
2.0000 | INHALATION_SPRAY | RESPIRATORY_TRACT | Status: DC | PRN
  Filled 2019-08-11: qty 6.7

## 2019-08-11 MED ORDER — SODIUM CHLORIDE 0.9 % IV SOLN
500.0000 mg | Freq: Once | INTRAVENOUS | Status: AC
  Administered 2019-08-11: 500 mg via INTRAVENOUS
  Filled 2019-08-11: qty 500

## 2019-08-11 MED ORDER — IOHEXOL 350 MG/ML SOLN
75.0000 mL | Freq: Once | INTRAVENOUS | Status: AC | PRN
  Administered 2019-08-11: 75 mL via INTRAVENOUS

## 2019-08-11 MED ORDER — SODIUM CHLORIDE 0.9 % IV SOLN
100.0000 mg | Freq: Two times a day (BID) | INTRAVENOUS | Status: DC
  Administered 2019-08-12: 100 mg via INTRAVENOUS
  Filled 2019-08-11 (×3): qty 100

## 2019-08-11 MED ORDER — FINASTERIDE 5 MG PO TABS
5.0000 mg | ORAL_TABLET | Freq: Every day | ORAL | Status: DC
  Administered 2019-08-11 – 2019-08-16 (×6): 5 mg via ORAL
  Filled 2019-08-11 (×6): qty 1

## 2019-08-11 NOTE — ED Notes (Signed)
This RN and Deneise Lever, RN placed pt's soiled clothes into pt specific bag. Fresh bed lines and chux placed on bed. Brief placed on pt.

## 2019-08-11 NOTE — ED Notes (Signed)
Repositioned in bed, meds given, NAD.

## 2019-08-11 NOTE — ED Notes (Signed)
Patient bed pad changed after urinary incontinence

## 2019-08-11 NOTE — ED Notes (Signed)
Upon entrance to room, pt found sleeping with [place] to misplaced L cheek. [place] adjusted for pt. Pt woken to introduce self to pt, complete neuro assessment and ask about any needs. Pt denied any needs. Bed locked low. Rails up. Call bell within reach.

## 2019-08-11 NOTE — ED Notes (Signed)
Pt sleeping. 

## 2019-08-11 NOTE — ED Notes (Signed)
This RN spoke to pts daughter, gave short update, explained visitor policy. Pt states he didn't want to talk to her at this time. We will try again later. Pt given urinal, red yeasty looking rash noted to bilateral groin areas, right > left. Old diaper removed, NT washed area placed barrier cream. Pt states he has chronic pain and usually gets his pain meds by now, this RN explained I will be with him shortly. NAD. Pulled up in bed and given blanket.

## 2019-08-11 NOTE — Progress Notes (Signed)
Report given to [place] on [place].

## 2019-08-11 NOTE — H&P (Signed)
History and Physical    Joel Wilson. [id number] DOB: 1941-07-13 DOA: 08/10/2019  Referring MD/NP/PA:   PCP: [facility]   Patient coming from:  The patient is coming from SNF.  At baseline, pt is dependent for most of ADL.        Chief Complaint: fall and weakness  HPI: Joel Wilson. is a 78 y.o. male with medical history significant of hypertension, hyperlipidemia, PAF not on anticoagulants, CAD, BPH, CKD stage IIIa, chronic lower back pain, who presents with fall and weakness.  Pt states that he has been having generalized weakness in the past several days, particularly in both legs, which has been progressively worsening. He tried to get up to pivot out of his wheelchair to use the bathroom last night and wasn't able to and fell on the floor. No LOC.  Denies head or neck injury. He refused CT of head or neck.  Patient states that he has chronic bilateral shoulder pain and chronic lower back pain, which is no significant change.  Patient does not have cough or shortness of breath, but he has oxygen desaturation to 87% on room air, which improved to 99% on 2 L nasal cannula oxygen.  Denies chest pain, fever or chills.  Patient denies nausea vomiting, diarrhea, abdominal pain, symptoms of UTI.  No unilateral numbness or tingling his extremities.  No facial droop or slurred speech   ED Course: pt was found to have WBC 14.5, lactic acid 1.3, troponin 14, negative COVID-19 test, worsening renal function, temperature 99, blood pressure 237/200, then 115/79, RR 27, heart rate 59-70. Pt is admitted to [facility] bed as inpatient.   CTA-dissection protocol showed:  1. Thoracoabdominal aortic atherosclerosis. No aneurysm, dissection, or acute aortic abnormality. Coronary artery calcifications versus stents. 2. Distended gallbladder with possible small layering stones or sludge. Mild common bile duct dilatation at 10 mm, no visualized choledocholithiasis.  3. Distal colonic  diverticulosis without diverticulitis. 4. Streaky atelectasis in both lower lobes   US-RUQ: 1. Distended gallbladder but no gallstones, sludge, or gallbladder wall thickening. 2. Small 11 mm hypoechoic lesion in the left lobe of the liver, not well characterized sonographically but likely cyst. 3. Common bile duct diameter: 3 mm proximally, flares distally to 7 mm.  Review of Systems:   General: no fevers, chills, no body weight gain, has fatigue and weakness HEENT: no blurry vision, hearing changes or sore throat Respiratory: no dyspnea, coughing, wheezing CV: no chest pain, no palpitations GI: no nausea, vomiting, abdominal pain, diarrhea, constipation GU: no dysuria, burning on urination, increased urinary frequency, hematuria  Ext: has leg edema Neuro: no numbness, or tingling, no vision change or hearing loss. Has fall. Skin: no rash, no skin tear. MSK: No muscle spasm, no deformity, no limitation of range of movement in spin Heme: No easy bruising.  Travel history: No recent long distant travel.  Allergy:  Allergies  Allergen Reactions   Aspirin     Past Medical History:  Diagnosis Date   BPH without obstruction/lower urinary tract symptoms    Coronary artery disease    a. 2003 PCI to [place];  b. 11/2012 Cath: LM nl, LAD 30, LCX nl, RI 40 ISR, RCA nl.   Hereditary and idiopathic peripheral neuropathy    Hyperlipidemia    Hypertension    Hypertensive heart disease    a. 08/2013 Echo: EF 70-75%, mild LVH.   Insomnia    Paroxysmal atrial fibrillation (HCC)    a.08/2013-->amio;  b.  CHA2DS2VASc = 4-->no longer on coumadin.   Vitamin D deficiency     Past Surgical History:  Procedure Laterality Date   CARDIAC CATHETERIZATION  11/13/12   [place]; EF >55%   CARDIAC CATHETERIZATION  2003   JOINT REPLACEMENT     left hip   NECK SURGERY      Social History:  reports that he has quit smoking. He has a 2.00 pack-year smoking history. He has never used smokeless  tobacco. He reports that he does not drink alcohol or use drugs.  Family History:  Family History  Problem Relation Age of Onset   Diabetes Neg Hx      Prior to Admission medications   Medication Sig Start Date End Date Taking? Authorizing Provider  acetaminophen (TYLENOL) 325 MG tablet Take 650 mg by mouth 4 (four) times daily.    [provider]  amiodarone (PACERONE) 200 MG tablet Take 1 tablet (200 mg total) by mouth at bedtime. 09/30/15   Theora Gianotti, NP  Calcium Carbonate-Vitamin D (CALCIUM 600+D) 600-400 MG-UNIT tablet Take 1 tablet by mouth daily. Reported on 03/23/2016    [provider]  cetirizine (ZYRTEC) 10 MG tablet Take 10 mg by mouth daily.    [provider]  chlorhexidine (PERIDEX) 0.12 % solution Use as directed 10 mLs in the mouth or throat 3 (three) times daily. Rinse swish and spit    [provider]  cholecalciferol (VITAMIN D) 1000 UNITS tablet Take 1,000 Units by mouth daily.    [provider]  clopidogrel (PLAVIX) 75 MG tablet Take 75 mg by mouth daily.    [provider]  diltiazem (DILACOR XR) 240 MG 24 hr capsule Take 240 mg by mouth at bedtime. Hold if SBP < 110 or pulse < 60    [provider]  finasteride (PROSCAR) 5 MG tablet Take 5 mg by mouth daily.     [provider]  gabapentin (NEURONTIN) 600 MG tablet Take 1,200 mg by mouth 3 (three) times daily.     [provider]  lisinopril (PRINIVIL,ZESTRIL) 10 MG tablet Take 10 mg by mouth daily.    [provider]  magnesium hydroxide (MILK OF MAGNESIA) 400 MG/5ML suspension Take 30 mLs by mouth daily as needed for mild constipation or moderate constipation.    [provider]  Melatonin 3 MG TABS Take 1 tablet (3 mg total) by mouth at bedtime. 11/08/15   Ngetich, Dinah C, NP  metoprolol succinate (TOPROL-XL) 25 MG 24 hr tablet Take 12.5 mg by mouth 2 (two) times daily.    [provider]    nitroGLYCERIN (NITROSTAT) 0.4 MG SL tablet Place 0.4 mg under the tongue every 5 (five) minutes as needed for chest pain.    [provider]  ondansetron (ZOFRAN) 4 MG tablet Take 4 mg by mouth every 6 (six) hours.    [provider]  oxycodone (OXY-IR) 5 MG capsule Take one and 1/2 tablet (7.5mg ) by mouth every 6 hours as needed for pain 09/13/16   Lauree Chandler, NP  senna (SENOKOT) 8.6 MG tablet Take 2 tablets by mouth 2 (two) times daily.     [provider]  simvastatin (ZOCOR) 20 MG tablet Take 1 tablet (20 mg total) by mouth at bedtime. 01/15/17 04/15/17  Minna Merritts, MD  tamsulosin (FLOMAX) 0.4 MG CAPS Take 0.4 mg by mouth daily.     [provider]    Physical Exam: Vitals:   08/11/19 1140  08/11/19 1505 08/11/19 1508 08/11/19 1930  BP:   (!) 157/62 (!) 174/71  Pulse:  63 74 76  Resp:   18   Temp:    99.2 F (37.3 C)  TempSrc:    Oral  SpO2: 99%  96% 97%  Weight:      Height:       General: Not in acute distress. Dry mucus and membrane. HEENT:       Eyes: PERRL, EOMI, no scleral icterus.       ENT: No discharge from the ears and nose, no pharynx injection, no tonsillar enlargement.        Neck: No JVD, no bruit, no mass felt. Heme: No neck lymph node enlargement. Cardiac: S1/S2, RRR, No murmurs, No gallops or rubs. Respiratory:  No rales, wheezing, rhonchi or rubs. GI: Soft, nondistended, nontender, no rebound pain, no organomegaly, BS present. GU: No hematuria Ext: 1+DP/PT pulse bilaterally.  Has chronic venous insufficiency change, has 2+ bilateral leg edema Musculoskeletal: No joint deformities, No joint redness or warmth, no limitation of ROM in spin. Skin: No rashes.  Neuro: Alert, oriented X3, cranial nerves II-XII grossly intact, has bilateral leg weakness Psych: Patient is not psychotic, no suicidal or hemocidal ideation.  Labs on Admission: I have personally reviewed following labs and imaging studies  CBC: Recent  Labs  Lab 08/10/19 2158  WBC 14.5*  HGB 12.1*  HCT 36.9*  MCV 96.1  PLT [id number]   Basic Metabolic Panel: Recent Labs  Lab 08/10/19 2158  NA 140  K 3.7  CL 106  CO2 26  GLUCOSE 113*  BUN 37*  CREATININE 1.71*  CALCIUM 9.3   GFR: Estimated Creatinine Clearance: 40.8 mL/min (A) (by C-G formula based on SCr of 1.71 mg/dL (H)). Liver Function Tests: Recent Labs  Lab 08/10/19 2158  AST 34  ALT 16  ALKPHOS 45  BILITOT 0.8  PROT 7.7  ALBUMIN 3.9   No results for input(s): LIPASE, AMYLASE in the last 168 hours. No results for input(s): AMMONIA in the last 168 hours. Coagulation Profile: No results for input(s): INR, PROTIME in the last 168 hours. Cardiac Enzymes: Recent Labs  Lab 08/11/19 0437  CKTOTAL 555*   BNP (last 3 results) No results for input(s): PROBNP in the last 8760 hours. HbA1C: No results for input(s): HGBA1C in the last 72 hours. CBG: No results for input(s): GLUCAP in the last 168 hours. Lipid Profile: No results for input(s): CHOL, HDL, LDLCALC, TRIG, CHOLHDL, LDLDIRECT in the last 72 hours. Thyroid Function Tests: No results for input(s): TSH, T4TOTAL, FREET4, T3FREE, THYROIDAB in the last 72 hours. Anemia Panel: No results for input(s): VITAMINB12, FOLATE, FERRITIN, TIBC, IRON, RETICCTPCT in the last 72 hours. Urine analysis:    Component Value Date/Time   COLORURINE YELLOW (A) 08/11/2019 0413   APPEARANCEUR CLEAR (A) 08/11/2019 0413   APPEARANCEUR Clear 08/29/2013 0454   LABSPEC 1.031 (H) 08/11/2019 0413   LABSPEC 1.019 08/29/2013 0454   PHURINE 5.0 08/11/2019 0413   GLUCOSEU NEGATIVE 08/11/2019 0413   GLUCOSEU Negative 08/29/2013 0454   HGBUR SMALL (A) 08/11/2019 0413   BILIRUBINUR NEGATIVE 08/11/2019 0413   BILIRUBINUR Negative 08/29/2013 0454   KETONESUR NEGATIVE 08/11/2019 0413   PROTEINUR NEGATIVE 08/11/2019 0413   NITRITE NEGATIVE 08/11/2019 0413   LEUKOCYTESUR NEGATIVE 08/11/2019 0413   LEUKOCYTESUR Negative 08/29/2013 0454    Sepsis Labs: @LABRCNTIP (procalcitonin:4,lacticidven:4) ) Recent Results (from the past 240 hour(s))  SARS CORONAVIRUS 2 (TAT 6-24 HRS) Nasopharyngeal Nasopharyngeal Swab  Status: None   Collection Time: 08/11/19  4:58 AM   Specimen: Nasopharyngeal Swab  Result Value Ref Range Status   SARS Coronavirus 2 NEGATIVE NEGATIVE Final    Comment: (NOTE) SARS-CoV-2 target nucleic acids are NOT DETECTED. The SARS-CoV-2 RNA is generally detectable in upper and lower respiratory specimens during the acute phase of infection. Negative results do not preclude SARS-CoV-2 infection, do not rule out co-infections with other pathogens, and should not be used as the sole basis for treatment or other patient management decisions. Negative results must be combined with clinical observations, patient history, and epidemiological information. The expected result is Negative. Fact Sheet for Patients: SugarRoll.be Fact Sheet for Healthcare Providers: https://www.woods-mathews.com/ This test is not yet approved or cleared by the [place] FDA and  has been authorized for detection and/or diagnosis of SARS-CoV-2 by FDA under an Emergency Use Authorization (EUA). This EUA will remain  in effect (meaning this test can be used) for the duration of the COVID-19 declaration under Section 56 4(b)(1) of the Act, 21 U.S.C. section 360bbb-3(b)(1), unless the authorization is terminated or revoked sooner. Performed at [facility], [place] [address] [place], [place] [address]      Radiological Exams on Admission: Dg Chest Portable 1 View  Result Date: 08/10/2019 CLINICAL DATA:  The chest pain. Progressive weakness. EXAM: PORTABLE CHEST 1 VIEW COMPARISON:  Chest radiograph 10/10/2015 FINDINGS: The cardiomediastinal contours are unchanged. Streaky opacities in the left mid lower lung zone, present on prior exam but increased. Pulmonary vasculature is normal. No  confluent consolidation, pleural effusion, or pneumothorax. No acute osseous abnormalities are seen. IMPRESSION: Streaky opacities in the left mid lower lung zone, present on 2017 exam but increased from. Findings likely represent atelectasis or atypical pneumonia superimposed on scarring. Electronically Signed   By: Keith Rake M.D.   On: 08/10/2019 23:16   Ct Angio Chest/abd/pel For Dissection W And/or Wo Contrast  Result Date: 08/11/2019 CLINICAL DATA:  Acute chest and back pain, aortic dissection suspected. Patient reports generalized weakness. EXAM: CT ANGIOGRAPHY CHEST, ABDOMEN AND PELVIS TECHNIQUE: Multidetector CT imaging through the chest, abdomen and pelvis was performed using the standard protocol during bolus administration of intravenous contrast. Multiplanar reconstructed images and MIPs were obtained and reviewed to evaluate the vascular anatomy. CONTRAST:  34mL OMNIPAQUE IOHEXOL 350 MG/ML SOLN COMPARISON:  Radiograph yesterday. FINDINGS: CTA CHEST FINDINGS Cardiovascular: Aortic atherosclerosis and tortuosity. No aortic dissection, hematoma, or evidence of acute aortic syndrome. Left vertebral artery arises directly from the thoracic aorta, variant arch anatomy. Mild multi chamber cardiomegaly. There are coronary artery calcifications, possible coronary stents. Mitral annulus calcifications. Limited assessment for pulmonary embolus given phase of contrast, no filling defects in the pulmonary arteries to the lobar level. Mediastinum/Nodes: No enlarged mediastinal or hilar lymph nodes. No esophageal wall thickening. No suspicious thyroid nodule. Lungs/Pleura: Linear atelectasis in both lower lobes and the lingula. Mild hypoventilatory atelectasis dependently. Trace pleural thickening without frank pleural effusion. No pulmonary edema or septal thickening. No pulmonary mass. Trachea and mainstem bronchi are patent. Musculoskeletal: There are no acute or suspicious osseous abnormalities.  Degenerative change in the thoracic spine. No compression deformity Review of the MIP images confirms the above findings. CTA ABDOMEN AND PELVIS FINDINGS VASCULAR Aorta: Normal in caliber with moderate atherosclerosis. No aneurysm, dissection, or evidence of vasculitis. Celiac: Patent with mild plaque at the origin. No dissection or aneurysm. Branch vessels are patent. SMA: Patent with mild plaque at the origin. Mesenteric branches are patent. Renals: Single  right renal artery. Three left renal arteries, 2 codominant and a tiny accessory to the upper pole. All renal arteries are patent with mild atheromatous plaque. No dissection or evidence of FMD. IMA: Patent without evidence of aneurysm, dissection, vasculitis or significant stenosis. Inflow: Patent without evidence of aneurysm, dissection, vasculitis or significant stenosis. Veins: No obvious venous abnormality within the limitations of this arterial phase study. Review of the MIP images confirms the above findings. NON-VASCULAR Hepatobiliary: 9 mm hypodensity in the left lobe, nonspecific and too small to accurately characterize. Calcified granuloma in the dome of the liver. Distended gallbladder. Possible small layering stones or sludge, series 5, image 80. Mild common bile duct dilatation at 10 mm. No visualized choledocholithiasis. Pancreas: Unremarkable. No pancreatic ductal dilatation or surrounding inflammatory changes. Spleen: Normal in size. Normal arterial enhancement. Adrenals/Urinary Tract: Normal adrenal glands. No hydronephrosis. Trace bilateral symmetric perinephric edema. Urinary bladder is unremarkable. Stomach/Bowel: Decompressed stomach. No small bowel obstruction or inflammatory change. Normal appendix. Moderate volume of colonic stool. No colonic inflammation. Diverticulosis of the distal descending and sigmoid colon. No evidence of diverticulitis. There is sigmoid colonic tortuosity. Lymphatic: No adenopathy. Reproductive: Prostate is  unremarkable. Other: No free air, free fluid, or intra-abdominal fluid collection. Musculoskeletal: Multilevel degenerative change throughout the lumbar spine. There are no acute or suspicious osseous abnormalities. Chronic irregularity of the right anterior iliac crest. Chronic calcifications in the anterior left upper thigh musculature, partially included, likely sequela of remote prior injury. Review of the MIP images confirms the above findings. IMPRESSION: 1. Thoracoabdominal aortic atherosclerosis. No aneurysm, dissection, or acute aortic abnormality. Coronary artery calcifications versus stents. 2. Distended gallbladder with possible small layering stones or sludge. Mild common bile duct dilatation at 10 mm, no visualized choledocholithiasis. Recommend correlation with LFTs. Consider further evaluation with right upper quadrant ultrasound. 3. Distal colonic diverticulosis without diverticulitis. 4. Streaky atelectasis in both lower lobes and lingula. Aortic Atherosclerosis (ICD10-I70.0). Electronically Signed   By: Keith Rake M.D.   On: 08/11/2019 01:02   US Abdomen Limited Ruq  Result Date: 08/11/2019 CLINICAL DATA:  Gallstones on CT.  Chest pain. EXAM: ULTRASOUND ABDOMEN LIMITED RIGHT UPPER QUADRANT COMPARISON:  None. FINDINGS: Gallbladder: Distended. No gallstones or sludge. No wall thickening. No sonographic Murphy sign noted by sonographer. Common bile duct: Diameter: 3 mm proximally, flares distally to 7 mm. Liver: Hypoechoic lesion in the left lobe of the liver measures 11 x 8 x 9 mm, corresponding to a hypodense lesion on CT, not well characterized sonographically. No other focal lesion. Within normal limits in parenchymal echogenicity. Portal vein is patent on color Doppler imaging with normal direction of blood flow towards the liver. Other: None. IMPRESSION: 1. Distended gallbladder but no gallstones, sludge, or gallbladder wall thickening. 2. Small 11 mm hypoechoic lesion in the left  lobe of the liver, not well characterized sonographically but likely cyst. Electronically Signed   By: Keith Rake M.D.   On: 08/11/2019 06:34     EKG: Independently reviewed.   Seems to be A fib, QTC 530, nonspecific T wave change  Assessment/Plan Principal Problem:   Fall Active Problems:   Coronary artery disease   Hyperlipidemia   Paroxysmal atrial fibrillation (HCC)   BPH without obstruction/lower urinary tract symptoms   Acute respiratory failure with hypoxia (HCC)   Generalized weakness   Liver lesion   Acute renal failure superimposed on stage 3a chronic kidney disease ([place])   HTN (hypertension)  Fall due to generalized weakness: pt refused Ct-head and CT-  C spin. His weakness is likely multifactorial etiology, including possible PNA, worsening renal Fx and dehydration. -will admit to med-surg bed as inpt. -PT/OT -IVF: 500 cc of NS, then 75 cc/h -check CK   Coronary artery disease: denies CP to me. Trop 14. -continue zocor and plavix  Hyperlipidemia: -zocor  Paroxysmal atrial fibrillation ([place]): not on anticoagulant at home -continue metoprolol and amiodarone, cardizem  BPH: stable - Continue Flomax and Proscar  Acute respiratory failure with hypoxia due to possible lobar PNA: Covid 19 negative. -Rocephin and doxycyline (d/c'ed Azithromycine due to QT prolongation). -Bx and sputum Cx  Liver lesion: -f/u with PCP  Acute renal failure superimposed on stage 3a chronic kidney disease ([place]): Baseline Cre is 1.0, pt's Cre is 1.71 and BUN 37 on admission. Likely due to dehydration and continuation of ACEI and diuretics. CK mildly elevated 555 - IVF as above - Follow up renal function by BMP - Avoid using renal toxic medications, hypotension and contrast dye (or carefully use) - Hold lasix and lisinopril  Essential hypertension: -Hydralazine prn -Continue home medications: metoprolol -- Hold lasix and lisinopril  Distended gallbladder: pt dose not have  any GI symptoms. LFT normal -monitoring   : DVT ppx: SQ Heparin   Code Status: Full code (I discussed with the patient about CODE STATUS, explained the meaning of CODE STATUS, he wants to be full code.) Family Communication: None at bed side.    Disposition Plan:  Anticipate discharge back to previous SNF environment Consults called:  none Admission status: med-surg bed as inpt    Date of Service 08/11/2019    [facility]   If 7PM-7AM, please contact night-coverage [email or web address] Password [facility] 08/11/2019, 9:53 PM

## 2019-08-11 NOTE — ED Notes (Signed)
Pt given hospital phone to talk with daughter.

## 2019-08-12 ENCOUNTER — Inpatient Hospital Stay: Payer: Medicare Other

## 2019-08-12 DIAGNOSIS — L8990 Pressure ulcer of unspecified site, unspecified stage: Secondary | ICD-10-CM | POA: Diagnosis present

## 2019-08-12 DIAGNOSIS — N1831 Chronic kidney disease, stage 3a: Secondary | ICD-10-CM

## 2019-08-12 DIAGNOSIS — J181 Lobar pneumonia, unspecified organism: Principal | ICD-10-CM

## 2019-08-12 DIAGNOSIS — L89619 Pressure ulcer of right heel, unspecified stage: Secondary | ICD-10-CM | POA: Diagnosis present

## 2019-08-12 DIAGNOSIS — W19XXXD Unspecified fall, subsequent encounter: Secondary | ICD-10-CM

## 2019-08-12 DIAGNOSIS — N179 Acute kidney failure, unspecified: Secondary | ICD-10-CM

## 2019-08-12 DIAGNOSIS — K769 Liver disease, unspecified: Secondary | ICD-10-CM

## 2019-08-12 LAB — BASIC METABOLIC PANEL
Anion gap: 12 (ref 5–15)
BUN: 20 mg/dL (ref 8–23)
CO2: 24 mmol/L (ref 22–32)
Calcium: 8.7 mg/dL — ABNORMAL LOW (ref 8.9–10.3)
Chloride: 108 mmol/L (ref 98–111)
Creatinine, Ser: 1.09 mg/dL (ref 0.61–1.24)
GFR calc Af Amer: >60 mL/min (ref >60)
GFR calc non Af Amer: >60 mL/min (ref >60)
Glucose, Bld: 115 mg/dL — ABNORMAL HIGH (ref 70–99)
Potassium: 3.8 mmol/L (ref 3.5–5.1)
Sodium: 144 mmol/L (ref 135–145)

## 2019-08-12 LAB — CBC
HCT: 33.7 % — ABNORMAL LOW (ref 39.0–52.0)
Hemoglobin: 11.7 g/dL — ABNORMAL LOW (ref 13.0–17.0)
MCH: 32.4 pg (ref 26.0–34.0)
MCHC: 34.7 g/dL (ref 30.0–36.0)
MCV: 93.4 fL (ref 80.0–100.0)
Platelets: 192 10*3/uL (ref 150–400)
RBC: 3.61 MIL/uL — ABNORMAL LOW (ref 4.22–5.81)
RDW: 12.4 % (ref 11.5–15.5)
WBC: 11.8 10*3/uL — ABNORMAL HIGH (ref 4.0–10.5)
nRBC: 0 % (ref 0.0–0.2)

## 2019-08-12 MED ORDER — ALBUTEROL SULFATE (2.5 MG/3ML) 0.083% IN NEBU
2.5000 mg | INHALATION_SOLUTION | RESPIRATORY_TRACT | Status: DC | PRN
  Administered 2019-08-12: 16:00:00 2.5 mg via RESPIRATORY_TRACT
  Filled 2019-08-12: qty 3

## 2019-08-12 MED ORDER — VITAMIN D3 25 MCG (1000 UNIT) PO TABS
2000.0000 [IU] | ORAL_TABLET | Freq: Every day | ORAL | Status: DC
  Administered 2019-08-14 – 2019-08-16 (×3): 2000 [IU] via ORAL
  Filled 2019-08-12 (×5): qty 2

## 2019-08-12 MED ORDER — DOXYCYCLINE HYCLATE 100 MG PO TABS
100.0000 mg | ORAL_TABLET | Freq: Two times a day (BID) | ORAL | Status: DC
  Administered 2019-08-12 – 2019-08-16 (×8): 100 mg via ORAL
  Filled 2019-08-12 (×8): qty 1

## 2019-08-12 MED ORDER — FUROSEMIDE 10 MG/ML IJ SOLN
40.0000 mg | Freq: Once | INTRAMUSCULAR | Status: AC
  Administered 2019-08-12: 40 mg via INTRAVENOUS
  Filled 2019-08-12: qty 4

## 2019-08-12 MED ORDER — LISINOPRIL 10 MG PO TABS
10.0000 mg | ORAL_TABLET | Freq: Every day | ORAL | Status: DC
  Administered 2019-08-13 – 2019-08-16 (×4): 10 mg via ORAL
  Filled 2019-08-12: qty 1
  Filled 2019-08-12: qty 2
  Filled 2019-08-12 (×2): qty 1

## 2019-08-12 MED ORDER — GUAIFENESIN-DM 100-10 MG/5ML PO SYRP
5.0000 mL | ORAL_SOLUTION | ORAL | Status: DC | PRN
  Administered 2019-08-12: 5 mL via ORAL
  Filled 2019-08-12: qty 5

## 2019-08-12 MED ORDER — ENOXAPARIN SODIUM 40 MG/0.4ML ~~LOC~~ SOLN
40.0000 mg | SUBCUTANEOUS | Status: DC
  Administered 2019-08-12 – 2019-08-15 (×4): 40 mg via SUBCUTANEOUS
  Filled 2019-08-12 (×4): qty 0.4

## 2019-08-12 NOTE — Progress Notes (Signed)
MD made aware that pt requesting cough meds, slight wheeze and slight crackles noted, new order for cough meds to be placed, decrease fluids to 63ml/hr

## 2019-08-12 NOTE — Progress Notes (Signed)
OT Cancellation Note  Patient Details Name: Joel Wilson. MRN: [id number] DOB: [birth year]   Cancelled Treatment:    Reason Eval/Treat Not Completed: Patient declined, no reason specified;Other (comment)  OT order received and chart reviewed. Upon OT presenting to room, pt states "I'm not taking any more therapy". OT attempts to encourage pt to participate in basic assessment to no avail at this time. Pt's bother present in room visiting pt. Will f/u as able/as pt becomes agreeable for OT evaluation.   Gerrianne Scale, [place], OTR/L ascom [phone] 08/12/19, 12:01 PM

## 2019-08-12 NOTE — Progress Notes (Signed)
PROGRESS NOTE                                                                                                                                                                                                             Patient Demographics:    Joel Wilson, is a 78 y.o. male, DOB - May 25, 1941, [id number]  Admit date - 08/10/2019   Admitting Physician Ivor Costa, MD  Outpatient Primary MD for the patient is [facility]  LOS - 1  Outpatient Specialists: None  Chief Complaint  Patient presents with  . Weakness       Brief Narrative 78 year old male resident of group home with history of hypertension, hyperlipidemia, paroxysmal A. fib not on anticoagulation, CAD, BPH, chronic kidney disease stage III, chronic low back pain presented with fall and weakness for several days.  In the ED was found to have oxygen desaturation to 87% on room air.  Blood work showed WC of 14.5K, acute kidney injury, elevated blood pressure of 237/200 mmHg.  Chest x-ray concerning for left-sided infiltrate.  Admitted for further management.   Subjective:   Reports having back pain (chronic) and feeling weak.  Also reports some cough.   Assessment  & Plan :    Principal Problem: Acute respiratory failure with hypoxia (HCC) SIRS (HCC) Secondary to left lobar pneumonia.  Empiric Rocephin and doxycycline.  COVID-19 tested negative.  Follow blood cultures.  Antitussives as needed.   Active Problems: Generalized weakness with fall/rhabdomyolysis Patient refused head CT and CT of the cervical spine.  Likely combination of pneumonia, dehydration and low back pain (chronic).  Seen by PT and recommends SNF. CPK in 500s.  Hold statin.  Gentle IV hydration.  Acute kidney injury superimposed on stage IIIa chronic kidney disease (HCC) Suspect prerenal with dehydration.  Improving with gentle hydration.  Holding Lasix.  Resume lisinopril.   BPH Continue Flomax and Proscar.  Paroxysmal A. fib Not on anticoagulation.  Rate controlled on metoprolol, amiodarone and Cardizem  Hypoechoic left lower lobe lesion/distended gallbladder No gallstones or sludge.  LFTs normal.  Check AFP.  Outpatient follow-up.  Essential hypertension Continue metoprolol and lisinopril.  Stage II bilateral buttock wound Wound care consult appreciated.  Code Status : Full code  Family Communication  : We will update son  Disposition Plan  : SNF per PT.  Possibly in the next 48 hours if improved  Barriers For Discharge : Active symptoms  Consults  : None  Procedures  : CT angiogram of the chest, ultrasound abdomen  DVT Prophylaxis  :  Lovenox -   Lab Results  Component Value Date   PLT 192 08/12/2019    Antibiotics  :    Anti-infectives (From admission, onward)   Start     Dose/Rate Route Frequency Ordered Stop   08/12/19 2000  doxycycline (VIBRA-TABS) tablet 100 mg     100 mg Oral Every 12 hours 08/12/19 1429     08/12/19 1000  doxycycline (VIBRAMYCIN) 100 mg in sodium chloride 0.9 % 250 mL IVPB  Status:  Discontinued     100 mg 125 mL/hr over 120 Minutes Intravenous Every 12 hours 08/11/19 1717 08/12/19 1429   08/12/19 0800  cefTRIAXone (ROCEPHIN) 1 g in sodium chloride 0.9 % 100 mL IVPB     1 g 200 mL/hr over 30 Minutes Intravenous Every 24 hours 08/11/19 1717     08/11/19 0445  azithromycin (ZITHROMAX) 500 mg in sodium chloride 0.9 % 250 mL IVPB     500 mg 250 mL/hr over 60 Minutes Intravenous  Once 08/11/19 0437 08/11/19 0656   08/11/19 0445  cefTRIAXone (ROCEPHIN) 1 g in sodium chloride 0.9 % 100 mL IVPB     1 g 200 mL/hr over 30 Minutes Intravenous  Once 08/11/19 0437 08/11/19 0536        Objective:   Vitals:   08/11/19 2230 08/12/19 0337 08/12/19 0340 08/12/19 0818  BP: (!) 156/65 (!) 147/55  (!) 150/52  Pulse: 70 71  62  Resp: 20     Temp: 98.2 F (36.8 C) 99 F (37.2 C)    TempSrc: Oral Oral    SpO2: 91%  91% 93%   Weight:      Height:        Wt Readings from Last 3 Encounters:  08/10/19 99.8 kg  01/31/17 98 kg  01/15/17 98 kg     Intake/Output Summary (Last 24 hours) at 08/12/2019 1501 Last data filed at 08/12/2019 1225 Gross per 24 hour  Intake 1514.08 ml  Output 600 ml  Net 914.08 ml     Physical Exam  Gen: not in distress, fatigue HEENT: no pallor, moist mucosa, supple neck Chest: clear b/l, no added sounds CVS: N S1&S2, no murmurs, GI: soft, NT, ND, BS+ Musculoskeletal: warm, trace edema (chronic mild lymphedema)     Data Review:    CBC Recent Labs  Lab 08/10/19 2158 08/12/19 0329  WBC 14.5* 11.8*  HGB 12.1* 11.7*  HCT 36.9* 33.7*  PLT 217 192  MCV 96.1 93.4  MCH 31.5 32.4  MCHC 32.8 34.7  RDW 12.6 12.4    Chemistries  Recent Labs  Lab 08/10/19 2158 08/12/19 0329  NA 140 144  K 3.7 3.8  CL 106 108  CO2 26 24  GLUCOSE 113* 115*  BUN 37* 20  CREATININE 1.71* 1.09  CALCIUM 9.3 8.7*  AST 34  --   ALT 16  --   ALKPHOS 45  --   BILITOT 0.8  --    ------------------------------------------------------------------------------------------------------------------ No results for input(s): CHOL, HDL, LDLCALC, TRIG, CHOLHDL, LDLDIRECT in the last 72 hours.  Lab Results  Component Value Date   HGBA1C 5.6 12/28/2016   ------------------------------------------------------------------------------------------------------------------ No results for input(s): TSH, T4TOTAL, T3FREE, THYROIDAB in the last 72 hours.  Invalid input(s): FREET3 ------------------------------------------------------------------------------------------------------------------ No results for input(s): VITAMINB12, FOLATE, FERRITIN,  TIBC, IRON, RETICCTPCT in the last 72 hours.  Coagulation profile No results for input(s): INR, PROTIME in the last 168 hours.  No results for input(s): DDIMER in the last 72 hours.  Cardiac Enzymes No results for input(s): CKMB, TROPONINI,  MYOGLOBIN in the last 168 hours.  Invalid input(s): CK ------------------------------------------------------------------------------------------------------------------    Component Value Date/Time   BNP 202.0 (H) 08/11/2019 1209    Inpatient Medications  Scheduled Meds: . amiodarone  200 mg Oral QHS  . calcium-vitamin D  1 tablet Oral Daily  . cholecalciferol  2,000 Units Oral Daily  . clopidogrel  75 mg Oral Daily  . guaiFENesin  600 mg Oral BID   And  . dextromethorphan  30 mg Oral BID  . diltiazem  240 mg Oral Daily  . doxycycline  100 mg Oral Q12H  . enoxaparin (LOVENOX) injection  40 mg Subcutaneous Q24H  . finasteride  5 mg Oral Daily  . gabapentin  1,200 mg Oral TID  . hydrocerin  1 application Topical Daily  . lidocaine  1 patch Transdermal Daily  . loratadine  10 mg Oral Daily  . Melatonin  5 mg Oral QHS  . metoprolol succinate  12.5 mg Oral BID  . oxyCODONE  10 mg Oral TID  . tamsulosin  0.4 mg Oral Daily  . triamcinolone ointment  1 application Topical BID  . vitamin B-12  1,000 mcg Oral Daily   Continuous Infusions: . sodium chloride 50 mL/hr at 08/12/19 1434  . cefTRIAXone (ROCEPHIN)  IV 1 g (08/12/19 0831)   PRN Meds:.acetaminophen **OR** acetaminophen, albuterol, alum & mag hydroxide-simeth, guaiFENesin-dextromethorphan, hydrALAZINE, loperamide, nitroGLYCERIN, ondansetron (ZOFRAN) IV, polyethylene glycol  Micro Results Recent Results (from the past 240 hour(s))  SARS CORONAVIRUS 2 (TAT 6-24 HRS) Nasopharyngeal Nasopharyngeal Swab     Status: None   Collection Time: 08/11/19  4:58 AM   Specimen: Nasopharyngeal Swab  Result Value Ref Range Status   SARS Coronavirus 2 NEGATIVE NEGATIVE Final    Comment: (NOTE) SARS-CoV-2 target nucleic acids are NOT DETECTED. The SARS-CoV-2 RNA is generally detectable in upper and lower respiratory specimens during the acute phase of infection. Negative results do not preclude SARS-CoV-2 infection, do not rule out  co-infections with other pathogens, and should not be used as the sole basis for treatment or other patient management decisions. Negative results must be combined with clinical observations, patient history, and epidemiological information. The expected result is Negative. Fact Sheet for Patients: SugarRoll.be Fact Sheet for Healthcare Providers: https://www.woods-mathews.com/ This test is not yet approved or cleared by the [place] FDA and  has been authorized for detection and/or diagnosis of SARS-CoV-2 by FDA under an Emergency Use Authorization (EUA). This EUA will remain  in effect (meaning this test can be used) for the duration of the COVID-19 declaration under Section 56 4(b)(1) of the Act, 21 U.S.C. section 360bbb-3(b)(1), unless the authorization is terminated or revoked sooner. Performed at [facility], [place] [address] [place], [place] [address]   CULTURE, BLOOD (ROUTINE X 2) w Reflex to ID Panel     Status: None (Preliminary result)   Collection Time: 08/11/19 10:36 AM   Specimen: BLOOD RIGHT ARM  Result Value Ref Range Status   Specimen Description BLOOD RIGHT ARM  Final   Special Requests   Final    BOTTLES DRAWN AEROBIC AND ANAEROBIC Blood Culture adequate volume   Culture   Final    NO GROWTH < 24 HOURS Performed at [facility], [address] [place], [place] [address]    Report Status PENDING  Incomplete  CULTURE, BLOOD (ROUTINE X 2) w Reflex to ID Panel     Status: None (Preliminary result)   Collection Time: 08/11/19 10:36 AM   Specimen: BLOOD LEFT ARM  Result Value Ref Range Status   Specimen Description BLOOD LEFT ARM  Final   Special Requests   Final    BOTTLES DRAWN AEROBIC AND ANAEROBIC Blood Culture adequate volume   Culture   Final    NO GROWTH < 24 HOURS Performed at [facility], [address] [place], [place] [address]    Report Status PENDING  Incomplete     Radiology Reports Dg Chest Portable 1 View  Result Date: 08/10/2019 CLINICAL DATA:  The chest pain. Progressive weakness. EXAM: PORTABLE CHEST 1 VIEW COMPARISON:  Chest radiograph 10/10/2015 FINDINGS: The cardiomediastinal contours are unchanged. Streaky opacities in the left mid lower lung zone, present on prior exam but increased. Pulmonary vasculature is normal. No confluent consolidation, pleural effusion, or pneumothorax. No acute osseous abnormalities are seen. IMPRESSION: Streaky opacities in the left mid lower lung zone, present on 2017 exam but increased from. Findings likely represent atelectasis or atypical pneumonia superimposed on scarring. Electronically Signed   By: Keith Rake M.D.   On: 08/10/2019 23:16   Ct Angio Chest/abd/pel For Dissection W And/or Wo Contrast  Result Date: 08/11/2019 CLINICAL DATA:  Acute chest and back pain, aortic dissection suspected. Patient reports generalized weakness. EXAM: CT ANGIOGRAPHY CHEST, ABDOMEN AND PELVIS TECHNIQUE: Multidetector CT imaging through the chest, abdomen and pelvis was performed using the standard protocol during bolus administration of intravenous contrast. Multiplanar reconstructed images and MIPs were obtained and reviewed to evaluate the vascular anatomy. CONTRAST:  74mL OMNIPAQUE IOHEXOL 350 MG/ML SOLN COMPARISON:  Radiograph yesterday. FINDINGS: CTA CHEST FINDINGS Cardiovascular: Aortic atherosclerosis and tortuosity. No aortic dissection, hematoma, or evidence of acute aortic syndrome. Left vertebral artery arises directly from the thoracic aorta, variant arch anatomy. Mild multi chamber cardiomegaly. There are coronary artery calcifications, possible coronary stents. Mitral annulus calcifications. Limited assessment for pulmonary embolus given phase of contrast, no filling defects in the pulmonary arteries to the lobar level. Mediastinum/Nodes: No enlarged mediastinal or hilar lymph nodes. No esophageal wall thickening. No  suspicious thyroid nodule. Lungs/Pleura: Linear atelectasis in both lower lobes and the lingula. Mild hypoventilatory atelectasis dependently. Trace pleural thickening without frank pleural effusion. No pulmonary edema or septal thickening. No pulmonary mass. Trachea and mainstem bronchi are patent. Musculoskeletal: There are no acute or suspicious osseous abnormalities. Degenerative change in the thoracic spine. No compression deformity Review of the MIP images confirms the above findings. CTA ABDOMEN AND PELVIS FINDINGS VASCULAR Aorta: Normal in caliber with moderate atherosclerosis. No aneurysm, dissection, or evidence of vasculitis. Celiac: Patent with mild plaque at the origin. No dissection or aneurysm. Branch vessels are patent. SMA: Patent with mild plaque at the origin. Mesenteric branches are patent. Renals: Single right renal artery. Three left renal arteries, 2 codominant and a tiny accessory to the upper pole. All renal arteries are patent with mild atheromatous plaque. No dissection or evidence of FMD. IMA: Patent without evidence of aneurysm, dissection, vasculitis or significant stenosis. Inflow: Patent without evidence of aneurysm, dissection, vasculitis or significant stenosis. Veins: No obvious venous abnormality within the limitations of this arterial phase study. Review of the MIP images confirms the above findings. NON-VASCULAR Hepatobiliary: 9 mm hypodensity in the left lobe, nonspecific and too small to accurately characterize. Calcified  granuloma in the dome of the liver. Distended gallbladder. Possible small layering stones or sludge, series 5, image 80. Mild common bile duct dilatation at 10 mm. No visualized choledocholithiasis. Pancreas: Unremarkable. No pancreatic ductal dilatation or surrounding inflammatory changes. Spleen: Normal in size. Normal arterial enhancement. Adrenals/Urinary Tract: Normal adrenal glands. No hydronephrosis. Trace bilateral symmetric perinephric edema. Urinary  bladder is unremarkable. Stomach/Bowel: Decompressed stomach. No small bowel obstruction or inflammatory change. Normal appendix. Moderate volume of colonic stool. No colonic inflammation. Diverticulosis of the distal descending and sigmoid colon. No evidence of diverticulitis. There is sigmoid colonic tortuosity. Lymphatic: No adenopathy. Reproductive: Prostate is unremarkable. Other: No free air, free fluid, or intra-abdominal fluid collection. Musculoskeletal: Multilevel degenerative change throughout the lumbar spine. There are no acute or suspicious osseous abnormalities. Chronic irregularity of the right anterior iliac crest. Chronic calcifications in the anterior left upper thigh musculature, partially included, likely sequela of remote prior injury. Review of the MIP images confirms the above findings. IMPRESSION: 1. Thoracoabdominal aortic atherosclerosis. No aneurysm, dissection, or acute aortic abnormality. Coronary artery calcifications versus stents. 2. Distended gallbladder with possible small layering stones or sludge. Mild common bile duct dilatation at 10 mm, no visualized choledocholithiasis. Recommend correlation with LFTs. Consider further evaluation with right upper quadrant ultrasound. 3. Distal colonic diverticulosis without diverticulitis. 4. Streaky atelectasis in both lower lobes and lingula. Aortic Atherosclerosis (ICD10-I70.0). Electronically Signed   By: Keith Rake M.D.   On: 08/11/2019 01:02   US Abdomen Limited Ruq  Result Date: 08/11/2019 CLINICAL DATA:  Gallstones on CT.  Chest pain. EXAM: ULTRASOUND ABDOMEN LIMITED RIGHT UPPER QUADRANT COMPARISON:  None. FINDINGS: Gallbladder: Distended. No gallstones or sludge. No wall thickening. No sonographic Murphy sign noted by sonographer. Common bile duct: Diameter: 3 mm proximally, flares distally to 7 mm. Liver: Hypoechoic lesion in the left lobe of the liver measures 11 x 8 x 9 mm, corresponding to a hypodense lesion on CT, not  well characterized sonographically. No other focal lesion. Within normal limits in parenchymal echogenicity. Portal vein is patent on color Doppler imaging with normal direction of blood flow towards the liver. Other: None. IMPRESSION: 1. Distended gallbladder but no gallstones, sludge, or gallbladder wall thickening. 2. Small 11 mm hypoechoic lesion in the left lobe of the liver, not well characterized sonographically but likely cyst. Electronically Signed   By: Keith Rake M.D.   On: 08/11/2019 06:34    Time Spent in minutes 35   Danylle Ouk M.D on 08/12/2019 at 3:01 PM  Between 7am to 7pm - Pager - [phone]  After 7pm go to [email or web address] - password [email or web address]  [facility] -  Office  [phone]

## 2019-08-12 NOTE — Evaluation (Signed)
Physical Therapy Evaluation Patient Details Name: Joel Wilson. MRN: [id number] DOB: [birth year] Today's Date: 08/12/2019   History of Present Illness  78 yo male admitted after presenting to ED after worsening generalized weakness and having a fall trying to pivot from w/c to toilet, No LOC, pt refused CT of head or neck. PMH includes HTN, HLD, CAD, PAF not on anticoagulants  Clinical Impression  Pt is a 78 yo male admitted for above. Pt presents with minimal LE strength 2-/2 /5. Pt reports using w/c for mobility for at least the last 5 years after an injury while serving in the [employer]. Pt required max A for rolling either direction and cuing for technique. While assessing bed mobility, noticed pad wet despite condom cath with pt unaware. Pt's condom cath then came undone while pt urinating. Nursing assistant present for assistance with rolling and getting EOB and to recliner. Pt required max multimodal cuing, encouragement and assist to get EOB and then to recliner. Pt became agitated stating he did not want to get up to recliner and that he could not get up. Pt educated from PT and nursing assistant on the importance of being in an upright position and getting out of bed. Pt encouraged to sit up in recliner for at least an hour. Pt reports severe pain in his back once in recliner however received pain medication an hour prior to session. Nursing aware. Pt presents with decreased strength, ROM, endurance and balance limiting functional mobility and would benefit from skilled acute PT to improve. Recommendation for SNF following hospital discharge in order to further maximize functional mobility gains and decrease fall risk.     Follow Up Recommendations SNF    Equipment Recommendations  None recommended by PT(pt already has w/c)    Recommendations for Other Services       Precautions / Restrictions Precautions Precautions: Fall Restrictions Weight Bearing Restrictions: No       Mobility  Bed Mobility Overal bed mobility: Needs Assistance Bed Mobility: Rolling;Sidelying to Sit Rolling: Max assist Sidelying to sit: Max assist;+2 for physical assistance       General bed mobility comments: pt required max A for rolling, pt required cuing for initiation, technique and frequent encouragement, suspect pt could do more but is self limiting, pt max A to roll either direction but unsure of how much effort pt actually putting forth this session  Transfers Overall transfer level: Needs assistance Equipment used: None Transfers: Squat Pivot Transfers     Squat pivot transfers: +2 safety/equipment;Max assist     General transfer comment: max A for squat pivot transfer to recliner, pt required max cuing for technique, +2 from nursing assistant for safety, pt reporting severe pain during and after transfer, pt agitated after getting to recliner however educated on importance of getting out of bed and being more upright  Ambulation/Gait             General Gait Details: unable  Stairs            Wheelchair Mobility    Modified Rankin (Stroke Patients Only)       Balance Overall balance assessment: Needs assistance Sitting-balance support: Bilateral upper extremity supported;Feet supported Sitting balance-Leahy Scale: Fair       Standing balance-Leahy Scale: Zero                               Pertinent Vitals/Pain Pain Assessment: Faces Faces Pain  Scale: Hurts worst Pain Location: back Pain Descriptors / Indicators: Grimacing;Discomfort;Aching Pain Intervention(s): Limited activity within patient's tolerance;Monitored during session;Premedicated before session;Repositioned    Home Living Family/patient expects to be discharged to:: Skilled nursing facility                      Prior Function Level of Independence: Needs assistance         Comments: pt initially reporting using w/c for at least last 5 years  however pretty independent with transfers to/from w/c, end of session pt reporting he did not say that and he always needs assistance with ADLs, pt stated he was unable to don socks when asked during session     Hand Dominance        Extremity/Trunk Assessment   Upper Extremity Assessment Upper Extremity Assessment: Generalized weakness;Defer to OT evaluation    Lower Extremity Assessment Lower Extremity Assessment: Generalized weakness(B LE grossly 2-/2 /5, however believe pt self limiting to some degree)       Communication   Communication: No difficulties  Cognition Arousal/Alertness: Awake/alert Behavior During Therapy: Agitated Overall Cognitive Status: Within Functional Limits for tasks assessed                                 General Comments: pt agitated about being asked to move and transfer to recliner, pt repeated multiple times he cant when asked to do various activities such as rolling and sit up EOB      General Comments      Exercises Total Joint Exercises Ankle Circles/Pumps: AROM;Both;5 reps   Assessment/Plan    PT Assessment Patient needs continued PT services  PT Problem List Decreased strength;Decreased mobility;Decreased safety awareness;Decreased range of motion;Decreased balance;Decreased knowledge of use of DME;Decreased activity tolerance;Pain;Cardiopulmonary status limiting activity       PT Treatment Interventions DME instruction;Therapeutic exercise;Gait training;Balance training;Stair training;Functional mobility training;Therapeutic activities;Patient/family education    PT Goals (Current goals can be found in the Care Plan section)  Acute Rehab PT Goals Patient Stated Goal: go home PT Goal Formulation: With patient Time For Goal Achievement: 08/26/19 Potential to Achieve Goals: Good    Frequency Min 2X/week   Barriers to discharge        Co-evaluation               AM-PAC PT "6 Clicks" Mobility  Outcome  Measure Help needed turning from your back to your side while in a flat bed without using bedrails?: A Lot Help needed moving from lying on your back to sitting on the side of a flat bed without using bedrails?: A Lot Help needed moving to and from a bed to a chair (including a wheelchair)?: A Lot Help needed standing up from a chair using your arms (e.g., wheelchair or bedside chair)?: Total Help needed to walk in hospital room?: Total Help needed climbing 3-5 steps with a railing? : Total 6 Click Score: 9    End of Session Equipment Utilized During Treatment: Gait belt Activity Tolerance: Patient limited by pain Patient left: in chair;with call bell/phone within reach;with chair alarm set Nurse Communication: Mobility status PT Visit Diagnosis: Difficulty in walking, not elsewhere classified (R26.2);Muscle weakness (generalized) (M62.81)    Time: [id number] PT Time Calculation (min) (ACUTE ONLY): 26 min   Charges:   PT Evaluation $PT Eval Moderate Complexity: 1 Mod PT Treatments $Therapeutic Activity: 8-22 mins  Zachary George PT, DPT 11:17 AM,08/12/19   Amaira Safley Drucilla Chalet 08/12/2019, 11:12 AM

## 2019-08-12 NOTE — Progress Notes (Signed)
MD aware that pt sounds wet, increase wheezes, this is a change from this morning, new order for a chest xray

## 2019-08-12 NOTE — Consult Note (Signed)
[place] Nurse Consult Note: Patient receiving care in [facility] 116.  Assisted with turing for assessment by primary RN> Reason for Consult: wounds on buttock and heel Wound type: right medial heel has a DTPI that measures 3.8 cm x 3 cm.  There is a foam dressing to the area.  I have ordered bilateral Prevalon heel lift boots to avoid further heel pressure. The lower sacrum/coccyx/bilateral buttocks at the top of the medial gluteal fold has a DTPI with bilateral buttock stage 2 wounds where the buttocks contact each other when in a supine position. The entire area measures 9 cm x 7 cm and is maroon with two pink ulcerations. Patient does not turn independently.  For this area, a foam dressing, offloading with an air mattress and turning right/left, and use of one Dermatherapy pad beneath the buttocks has been ordered. Monitor the wound area(s) for worsening of condition such as: Signs/symptoms of infection,  Increase in size,  Development of or worsening of odor, Development of pain, or increased pain at the affected locations.  Notify the medical team if any of these develop.  Thank you for the consult.  Discussed plan of care with the patient and bedside nurse.  [place] nurse will not follow at this time.  Please re-consult the [place] team if needed.  Val Riles, RN, MSN, CWOCN, CNS-BC, pager [phone]

## 2019-08-13 DIAGNOSIS — J9601 Acute respiratory failure with hypoxia: Secondary | ICD-10-CM

## 2019-08-13 LAB — CK: Total CK: 692 U/L — ABNORMAL HIGH (ref 49–397)

## 2019-08-13 LAB — BASIC METABOLIC PANEL
Anion gap: 12 (ref 5–15)
BUN: 23 mg/dL (ref 8–23)
CO2: 27 mmol/L (ref 22–32)
Calcium: 9 mg/dL (ref 8.9–10.3)
Chloride: 105 mmol/L (ref 98–111)
Creatinine, Ser: 1.02 mg/dL (ref 0.61–1.24)
GFR calc Af Amer: >60 mL/min (ref >60)
GFR calc non Af Amer: >60 mL/min (ref >60)
Glucose, Bld: 103 mg/dL — ABNORMAL HIGH (ref 70–99)
Potassium: 3.9 mmol/L (ref 3.5–5.1)
Sodium: 144 mmol/L (ref 135–145)

## 2019-08-13 MED ORDER — FUROSEMIDE 20 MG PO TABS
20.0000 mg | ORAL_TABLET | Freq: Every day | ORAL | Status: DC
  Administered 2019-08-13 – 2019-08-16 (×4): 20 mg via ORAL
  Filled 2019-08-13 (×4): qty 1

## 2019-08-13 MED ORDER — SALINE SPRAY 0.65 % NA SOLN
1.0000 | NASAL | Status: DC | PRN
  Administered 2019-08-13: 1 via NASAL
  Filled 2019-08-13: qty 44

## 2019-08-13 MED ORDER — OXYCODONE HCL 5 MG PO TABS
7.5000 mg | ORAL_TABLET | Freq: Four times a day (QID) | ORAL | Status: DC | PRN
  Administered 2019-08-13 – 2019-08-16 (×12): 7.5 mg via ORAL
  Filled 2019-08-13 (×13): qty 2

## 2019-08-13 NOTE — TOC Initial Note (Signed)
Transition of Care (TOC) - Initial/Assessment Note    Patient Details  Name: Joel Wilson. MRN: [id number] Date of Birth: 25-Dec-1940  Transition of Care [facility]) CM/SW Contact:    Candie Chroman, LCSW Phone Number: 08/13/2019, 3:27 PM  Clinical Narrative:  Received call back from [facility]. They confirmed patient is a resident there and can return at discharge. They are aware of SNF recommendation but patient can return with home health. Patient was supposed to start home health services for wound care on the day of admission. No further concerns. CSW will continue to follow and facilitate return to ALF once stable.                Expected Discharge Plan: Assisted Living(with home health services.)     Patient Goals and CMS Choice        Expected Discharge Plan and Services Expected Discharge Plan: Assisted Living(with home health services.)     Post Acute Care Choice: [place] arrangements for the past 2 months: [place]                                      Prior Living Arrangements/Services Living arrangements for the past 2 months: [place] Lives with:: Facility Resident Patient language and need for interpreter reviewed:: Yes Do you feel safe going back to the place where you live?: Yes      Need for Family Participation in Patient Care: Yes (Comment) Care giver support system in place?: Yes (comment)   Criminal Activity/Legal Involvement Pertinent to Current Situation/Hospitalization: No - Comment as needed  Activities of Daily Living Home Assistive Devices/Equipment: Wheelchair ADL Screening (condition at time of admission) Patient's cognitive ability adequate to safely complete daily activities?: Yes Is the patient deaf or have difficulty hearing?: No Does the patient have difficulty seeing, even when wearing glasses/contacts?: No Does the patient have difficulty concentrating, remembering, or making  decisions?: No Patient able to express need for assistance with ADLs?: Yes Does the patient have difficulty dressing or bathing?: Yes Independently performs ADLs?: No Communication: Independent Dressing (OT): Needs assistance Is this a change from baseline?: Change from baseline, expected to last <3days Grooming: Independent Feeding: Independent Bathing: Needs assistance Is this a change from baseline?: Change from baseline, expected to last <3 days Toileting: Needs assistance Is this a change from baseline?: Change from baseline, expected to last <3 days In/Out Bed: Needs assistance Is this a change from baseline?: Change from baseline, expected to last <3 days Walks in Home: Needs assistance Is this a change from baseline?: Pre-admission baseline Does the patient have difficulty walking or climbing stairs?: Yes Weakness of Legs: Both Weakness of Arms/Hands: None  Permission Sought/Granted Permission sought to share information with : Investment banker, corporate granted to share info w AGENCY: [facility]        Emotional Assessment Appearance:: Appears stated age Attitude/Demeanor/Rapport: Unable to Assess Affect (typically observed): Unable to Assess Orientation: : Oriented to Place, Oriented to Self, Oriented to  Time, Oriented to Situation Alcohol / Substance Use: Not Applicable Psych Involvement: No (comment)  Admission diagnosis:  Weakness [R53.1] Cholelithiasis [K80.20] Fall [W19.XXXA] Patient Active Problem List   Diagnosis Date Noted  . Pressure injury of skin 08/12/2019  . Fall 08/11/2019  . Acute respiratory failure with hypoxia ([place]) 08/11/2019  . Generalized weakness  08/11/2019  . Liver lesion 08/11/2019  . Acute renal failure superimposed on stage 3a chronic kidney disease ([place]) 08/11/2019  . HTN (hypertension) 08/11/2019  . Seasonal allergic rhinitis 01/03/2017  . Chronic pain syndrome 05/16/2016  . Chronic lumbar pain  03/23/2016  . Abnormality of gait 12/09/2015  . Insomnia 12/09/2015  . Vitamin D deficiency 11/08/2015  . Peripheral neuropathy 11/08/2015  . BPH without obstruction/lower urinary tract symptoms 11/08/2015  . Hypertensive heart disease   . Paroxysmal atrial fibrillation (HCC)   . Long term (current) use of anticoagulants 09/07/2013  . Atrial fibrillation ([place]) 09/04/2013  . Coronary artery disease   . Hyperlipidemia    PCP:  [facility]:   [id number] - [place], [place] [address] Phone: [phone] Fax: [phone]  [place], [place] - [place] [address] Phone: [phone] Fax: [place], [place] - [place] [address] [place] [address] Phone: [phone] Fax: [phone]     Social Determinants of Health (SDOH) Interventions    Readmission Risk Interventions No flowsheet data found.

## 2019-08-13 NOTE — Progress Notes (Signed)
PROGRESS NOTE                                                                                                                                                                                                             Patient Demographics:    Joel Wilson, is a 78 y.o. male, DOB - Oct 10, 1940, [id number]  Admit date - 08/10/2019   Admitting Physician Ivor Costa, MD  Outpatient Primary MD for the patient is [facility]  LOS - 2  Outpatient Specialists: None  Chief Complaint  Patient presents with   Weakness       Brief Narrative 78 year old male resident of group home with history of hypertension, hyperlipidemia, paroxysmal A. fib not on anticoagulation, CAD, BPH, chronic kidney disease stage III, chronic low back pain presented with fall and weakness for several days.  In the ED was found to have oxygen desaturation to 87% on room air.  Blood work showed WC of 14.5K, acute kidney injury, elevated blood pressure of 237/200 mmHg.  Chest x-ray concerning for left-sided infiltrate.  Admitted for further management.   Subjective:   Still complaining of low back pain.  He was short of breath yesterday with chest x-ray showing some fluid overload.  Received IV Lasix with improvement.  Reports he is breathing much better today.   Assessment  & Plan :    Principal Problem: Acute respiratory failure with hypoxia (HCC) SIRS (HCC) Secondary to left lobar pneumonia.  Continue empiric Rocephin and doxycycline.  COVID-19 tested negative. blood cultures negative so far.  Antitussives as needed.   Active Problems: Generalized weakness with fall/rhabdomyolysis Patient refused head CT and CT of the cervical spine.  Likely combination of pneumonia, dehydration and low back pain (chronic).  Seen by PT and recommends SNF.  Patient refusing and wants to go back to group home. CPK elevated to 600.  Statin held.  Received  IV hydration but discontinued due to volume overload.  Acute kidney injury superimposed on stage IIIa chronic kidney disease (HCC) Suspect prerenal with dehydration.  Improved with gentle hydration.  Resume Lasix and lisinopril.  BPH Continue Flomax and Proscar.  Paroxysmal A. fib Not on anticoagulation.  Rate controlled on metoprolol, amiodarone and Cardizem  Hypoechoic left lower lobe lesion/distended gallbladder No gallstones or sludge.  LFTs normal.  Check AFP.  Outpatient follow-up.  Essential hypertension Continue metoprolol and lisinopril.  Stage II bilateral buttock wound Wound care consult appreciated.  Code Status : Full code  Family Communication  : Son updated on the phone  Disposition Plan  : SNF per PT. patient refuses and wants to go back to group home.  We will plan on arranging home health RN and PT over there.  Possible discharge tomorrow if symptoms better  Barriers For Discharge : Active symptoms  Consults  : None  Procedures  : CT angiogram of the chest, ultrasound abdomen  DVT Prophylaxis  :  Lovenox -   Lab Results  Component Value Date   PLT 192 08/12/2019    Antibiotics  :    Anti-infectives (From admission, onward)   Start     Dose/Rate Route Frequency Ordered Stop   08/12/19 2000  doxycycline (VIBRA-TABS) tablet 100 mg     100 mg Oral Every 12 hours 08/12/19 1429     08/12/19 1000  doxycycline (VIBRAMYCIN) 100 mg in sodium chloride 0.9 % 250 mL IVPB  Status:  Discontinued     100 mg 125 mL/hr over 120 Minutes Intravenous Every 12 hours 08/11/19 1717 08/12/19 1429   08/12/19 0800  cefTRIAXone (ROCEPHIN) 1 g in sodium chloride 0.9 % 100 mL IVPB     1 g 200 mL/hr over 30 Minutes Intravenous Every 24 hours 08/11/19 1717     08/11/19 0445  azithromycin (ZITHROMAX) 500 mg in sodium chloride 0.9 % 250 mL IVPB     500 mg 250 mL/hr over 60 Minutes Intravenous  Once 08/11/19 0437 08/11/19 0656   08/11/19 0445  cefTRIAXone (ROCEPHIN) 1 g in sodium  chloride 0.9 % 100 mL IVPB     1 g 200 mL/hr over 30 Minutes Intravenous  Once 08/11/19 0437 08/11/19 0536        Objective:   Vitals:   08/12/19 1603 08/12/19 1943 08/13/19 0423 08/13/19 0737  BP:  (!) 137/45 (!) 150/52 (!) 153/66  Pulse:  66 61 62  Resp:      Temp:  98.4 F (36.9 C) 98.7 F (37.1 C) 98.3 F (36.8 C)  TempSrc:  Oral Oral Oral  SpO2: 91% 96% 96% 98%  Weight:      Height:        Wt Readings from Last 3 Encounters:  08/10/19 99.8 kg  01/31/17 98 kg  01/15/17 98 kg     Intake/Output Summary (Last 24 hours) at 08/13/2019 1201 Last data filed at 08/13/2019 0900 Gross per 24 hour  Intake 894.37 ml  Output 1900 ml  Net -1005.63 ml    Physical exam Not in distress HEENT: Moist with a, supple neck Chest: Clear breath sounds bilaterally CVs: Normal S1-S2 GI: Soft, nondistended, nontender Musculoskeletal: Warm, trace edema bilaterally     Data Review:    CBC Recent Labs  Lab 08/10/19 2158 08/12/19 0329  WBC 14.5* 11.8*  HGB 12.1* 11.7*  HCT 36.9* 33.7*  PLT 217 192  MCV 96.1 93.4  MCH 31.5 32.4  MCHC 32.8 34.7  RDW 12.6 12.4    Chemistries  Recent Labs  Lab 08/10/19 2158 08/12/19 0329 08/13/19 0647  NA 140 144 144  K 3.7 3.8 3.9  CL 106 108 105  CO2 26 24 27   GLUCOSE 113* 115* 103*  BUN 37* 20 23  CREATININE 1.71* 1.09 1.02  CALCIUM 9.3 8.7* 9.0  AST 34  --   --   ALT 16  --   --  ALKPHOS 45  --   --   BILITOT 0.8  --   --    ------------------------------------------------------------------------------------------------------------------ No results for input(s): CHOL, HDL, LDLCALC, TRIG, CHOLHDL, LDLDIRECT in the last 72 hours.  Lab Results  Component Value Date   HGBA1C 5.6 12/28/2016   ------------------------------------------------------------------------------------------------------------------ No results for input(s): TSH, T4TOTAL, T3FREE, THYROIDAB in the last 72 hours.  Invalid input(s):  FREET3 ------------------------------------------------------------------------------------------------------------------ No results for input(s): VITAMINB12, FOLATE, FERRITIN, TIBC, IRON, RETICCTPCT in the last 72 hours.  Coagulation profile No results for input(s): INR, PROTIME in the last 168 hours.  No results for input(s): DDIMER in the last 72 hours.  Cardiac Enzymes No results for input(s): CKMB, TROPONINI, MYOGLOBIN in the last 168 hours.  Invalid input(s): CK ------------------------------------------------------------------------------------------------------------------    Component Value Date/Time   BNP 202.0 (H) 08/11/2019 1209    Inpatient Medications  Scheduled Meds:  amiodarone  200 mg Oral QHS   calcium-vitamin D  1 tablet Oral Daily   cholecalciferol  2,000 Units Oral Daily   cholecalciferol  2,000 Units Oral Daily   clopidogrel  75 mg Oral Daily   guaiFENesin  600 mg Oral BID   And   dextromethorphan  30 mg Oral BID   diltiazem  240 mg Oral Daily   doxycycline  100 mg Oral Q12H   enoxaparin (LOVENOX) injection  40 mg Subcutaneous Q24H   finasteride  5 mg Oral Daily   gabapentin  1,200 mg Oral TID   hydrocerin  1 application Topical Daily   lidocaine  1 patch Transdermal Daily   lisinopril  10 mg Oral Daily   loratadine  10 mg Oral Daily   Melatonin  5 mg Oral QHS   metoprolol succinate  12.5 mg Oral BID   tamsulosin  0.4 mg Oral Daily   triamcinolone ointment  1 application Topical BID   vitamin B-12  1,000 mcg Oral Daily   Continuous Infusions:  cefTRIAXone (ROCEPHIN)  IV Stopped (08/12/19 0900)   PRN Meds:.acetaminophen **OR** acetaminophen, albuterol, alum & mag hydroxide-simeth, guaiFENesin-dextromethorphan, hydrALAZINE, loperamide, nitroGLYCERIN, ondansetron (ZOFRAN) IV, oxyCODONE, polyethylene glycol, sodium chloride  Micro Results Recent Results (from the past 240 hour(s))  SARS CORONAVIRUS 2 (TAT 6-24 HRS)  Nasopharyngeal Nasopharyngeal Swab     Status: None   Collection Time: 08/11/19  4:58 AM   Specimen: Nasopharyngeal Swab  Result Value Ref Range Status   SARS Coronavirus 2 NEGATIVE NEGATIVE Final    Comment: (NOTE) SARS-CoV-2 target nucleic acids are NOT DETECTED. The SARS-CoV-2 RNA is generally detectable in upper and lower respiratory specimens during the acute phase of infection. Negative results do not preclude SARS-CoV-2 infection, do not rule out co-infections with other pathogens, and should not be used as the sole basis for treatment or other patient management decisions. Negative results must be combined with clinical observations, patient history, and epidemiological information. The expected result is Negative. Fact Sheet for Patients: SugarRoll.be Fact Sheet for Healthcare Providers: https://www.woods-mathews.com/ This test is not yet approved or cleared by the [place] FDA and  has been authorized for detection and/or diagnosis of SARS-CoV-2 by FDA under an Emergency Use Authorization (EUA). This EUA will remain  in effect (meaning this test can be used) for the duration of the COVID-19 declaration under Section 56 4(b)(1) of the Act, 21 U.S.C. section 360bbb-3(b)(1), unless the authorization is terminated or revoked sooner. Performed at [facility], [place] [address] [place], [place] [address]   CULTURE, BLOOD (ROUTINE X 2) w Reflex to ID  Panel     Status: None (Preliminary result)   Collection Time: 08/11/19 10:36 AM   Specimen: BLOOD RIGHT ARM  Result Value Ref Range Status   Specimen Description BLOOD RIGHT ARM  Final   Special Requests   Final    BOTTLES DRAWN AEROBIC AND ANAEROBIC Blood Culture adequate volume   Culture   Final    NO GROWTH 2 DAYS Performed at [facility], [address], [place], [place] [address]    Report Status PENDING  Incomplete  CULTURE, BLOOD (ROUTINE X 2) w Reflex to ID  Panel     Status: None (Preliminary result)   Collection Time: 08/11/19 10:36 AM   Specimen: BLOOD LEFT ARM  Result Value Ref Range Status   Specimen Description BLOOD LEFT ARM  Final   Special Requests   Final    BOTTLES DRAWN AEROBIC AND ANAEROBIC Blood Culture adequate volume   Culture   Final    NO GROWTH 2 DAYS Performed at [facility], [address], [place], [place] [address]    Report Status PENDING  Incomplete    Radiology Reports DG Chest Port 1 View  Result Date: 08/12/2019 CLINICAL DATA:  Shortness of breath. Heart disease. Atrial fibrillation. Ex-smoker. EXAM: PORTABLE CHEST 1 VIEW COMPARISON:  08/10/2019 FINDINGS: Midline trachea. Borderline cardiomegaly. Atherosclerosis in the transverse aorta. No pleural effusion or pneumothorax. Development of relatively diffuse interstitial prominence with perihilar airspace and interstitial opacities. IMPRESSION: Interstitial and airspace disease bilaterally. Favor pulmonary edema. Especially given lack of pleural fluid, atypical infection is a secondary consideration. Electronically Signed   By: Abigail Miyamoto M.D.   On: 08/12/2019 15:49   DG Chest Portable 1 View  Result Date: 08/10/2019 CLINICAL DATA:  The chest pain. Progressive weakness. EXAM: PORTABLE CHEST 1 VIEW COMPARISON:  Chest radiograph 10/10/2015 FINDINGS: The cardiomediastinal contours are unchanged. Streaky opacities in the left mid lower lung zone, present on prior exam but increased. Pulmonary vasculature is normal. No confluent consolidation, pleural effusion, or pneumothorax. No acute osseous abnormalities are seen. IMPRESSION: Streaky opacities in the left mid lower lung zone, present on 2017 exam but increased from. Findings likely represent atelectasis or atypical pneumonia superimposed on scarring. Electronically Signed   By: Keith Rake M.D.   On: 08/10/2019 23:16   CT Angio Chest/Abd/Pel for Dissection W and/or Wo Contrast  Result Date:  08/11/2019 CLINICAL DATA:  Acute chest and back pain, aortic dissection suspected. Patient reports generalized weakness. EXAM: CT ANGIOGRAPHY CHEST, ABDOMEN AND PELVIS TECHNIQUE: Multidetector CT imaging through the chest, abdomen and pelvis was performed using the standard protocol during bolus administration of intravenous contrast. Multiplanar reconstructed images and MIPs were obtained and reviewed to evaluate the vascular anatomy. CONTRAST:  72mL OMNIPAQUE IOHEXOL 350 MG/ML SOLN COMPARISON:  Radiograph yesterday. FINDINGS: CTA CHEST FINDINGS Cardiovascular: Aortic atherosclerosis and tortuosity. No aortic dissection, hematoma, or evidence of acute aortic syndrome. Left vertebral artery arises directly from the thoracic aorta, variant arch anatomy. Mild multi chamber cardiomegaly. There are coronary artery calcifications, possible coronary stents. Mitral annulus calcifications. Limited assessment for pulmonary embolus given phase of contrast, no filling defects in the pulmonary arteries to the lobar level. Mediastinum/Nodes: No enlarged mediastinal or hilar lymph nodes. No esophageal wall thickening. No suspicious thyroid nodule. Lungs/Pleura: Linear atelectasis in both lower lobes and the lingula. Mild hypoventilatory atelectasis dependently. Trace pleural thickening without frank pleural effusion. No pulmonary edema or septal thickening. No pulmonary mass. Trachea and mainstem bronchi are patent. Musculoskeletal: There are no  acute or suspicious osseous abnormalities. Degenerative change in the thoracic spine. No compression deformity Review of the MIP images confirms the above findings. CTA ABDOMEN AND PELVIS FINDINGS VASCULAR Aorta: Normal in caliber with moderate atherosclerosis. No aneurysm, dissection, or evidence of vasculitis. Celiac: Patent with mild plaque at the origin. No dissection or aneurysm. Branch vessels are patent. SMA: Patent with mild plaque at the origin. Mesenteric branches are patent.  Renals: Single right renal artery. Three left renal arteries, 2 codominant and a tiny accessory to the upper pole. All renal arteries are patent with mild atheromatous plaque. No dissection or evidence of FMD. IMA: Patent without evidence of aneurysm, dissection, vasculitis or significant stenosis. Inflow: Patent without evidence of aneurysm, dissection, vasculitis or significant stenosis. Veins: No obvious venous abnormality within the limitations of this arterial phase study. Review of the MIP images confirms the above findings. NON-VASCULAR Hepatobiliary: 9 mm hypodensity in the left lobe, nonspecific and too small to accurately characterize. Calcified granuloma in the dome of the liver. Distended gallbladder. Possible small layering stones or sludge, series 5, image 80. Mild common bile duct dilatation at 10 mm. No visualized choledocholithiasis. Pancreas: Unremarkable. No pancreatic ductal dilatation or surrounding inflammatory changes. Spleen: Normal in size. Normal arterial enhancement. Adrenals/Urinary Tract: Normal adrenal glands. No hydronephrosis. Trace bilateral symmetric perinephric edema. Urinary bladder is unremarkable. Stomach/Bowel: Decompressed stomach. No small bowel obstruction or inflammatory change. Normal appendix. Moderate volume of colonic stool. No colonic inflammation. Diverticulosis of the distal descending and sigmoid colon. No evidence of diverticulitis. There is sigmoid colonic tortuosity. Lymphatic: No adenopathy. Reproductive: Prostate is unremarkable. Other: No free air, free fluid, or intra-abdominal fluid collection. Musculoskeletal: Multilevel degenerative change throughout the lumbar spine. There are no acute or suspicious osseous abnormalities. Chronic irregularity of the right anterior iliac crest. Chronic calcifications in the anterior left upper thigh musculature, partially included, likely sequela of remote prior injury. Review of the MIP images confirms the above findings.  IMPRESSION: 1. Thoracoabdominal aortic atherosclerosis. No aneurysm, dissection, or acute aortic abnormality. Coronary artery calcifications versus stents. 2. Distended gallbladder with possible small layering stones or sludge. Mild common bile duct dilatation at 10 mm, no visualized choledocholithiasis. Recommend correlation with LFTs. Consider further evaluation with right upper quadrant ultrasound. 3. Distal colonic diverticulosis without diverticulitis. 4. Streaky atelectasis in both lower lobes and lingula. Aortic Atherosclerosis (ICD10-I70.0). Electronically Signed   By: Keith Rake M.D.   On: 08/11/2019 01:02   US Abdomen Limited RUQ  Result Date: 08/11/2019 CLINICAL DATA:  Gallstones on CT.  Chest pain. EXAM: ULTRASOUND ABDOMEN LIMITED RIGHT UPPER QUADRANT COMPARISON:  None. FINDINGS: Gallbladder: Distended. No gallstones or sludge. No wall thickening. No sonographic Murphy sign noted by sonographer. Common bile duct: Diameter: 3 mm proximally, flares distally to 7 mm. Liver: Hypoechoic lesion in the left lobe of the liver measures 11 x 8 x 9 mm, corresponding to a hypodense lesion on CT, not well characterized sonographically. No other focal lesion. Within normal limits in parenchymal echogenicity. Portal vein is patent on color Doppler imaging with normal direction of blood flow towards the liver. Other: None. IMPRESSION: 1. Distended gallbladder but no gallstones, sludge, or gallbladder wall thickening. 2. Small 11 mm hypoechoic lesion in the left lobe of the liver, not well characterized sonographically but likely cyst. Electronically Signed   By: Keith Rake M.D.   On: 08/11/2019 06:34    Time Spent in minutes 25   Cayce Quezada M.D on 08/13/2019 at 12:01 PM  Between 7am to  7pm - Pager - [phone]  After 7pm go to [email or web address] - password [facility]  [facility] -  Office  [phone]

## 2019-08-13 NOTE — Progress Notes (Signed)
OT Cancellation Note  Patient Details Name: Joel Wilson. MRN: [id number] DOB: 1941-01-20   Cancelled Treatment:    Reason Eval/Treat Not Completed: Other (comment)  When OT presents to pt room this AM, he is not agreeable to therapy participation. Pt states "I've already told you I'm not taking any therapy." OT attempts to encourage pt to participate in basic assessment on some level. Pt finally states, "if I get my pain medicine, I might do something." OT informs nursing that pt wants pain medication. Will f/u for occupational therapy evaluation as able/as pt becomes more agreeable. Thank you.   Gerrianne Scale, [place], OTR/L ascom [phone] 08/13/19, 9:23 AM

## 2019-08-14 DIAGNOSIS — M6281 Muscle weakness (generalized): Secondary | ICD-10-CM

## 2019-08-14 DIAGNOSIS — M6282 Rhabdomyolysis: Secondary | ICD-10-CM

## 2019-08-14 LAB — SARS CORONAVIRUS 2 (TAT 6-24 HRS): SARS Coronavirus 2: NEGATIVE

## 2019-08-14 LAB — AFP TUMOR MARKER: AFP, Serum, Tumor Marker: 1.1 ng/mL (ref 0.0–8.3)

## 2019-08-14 NOTE — Care Management Important Message (Signed)
Important Message  Patient Details  Name: Joel Wilson. MRN: [id number] Date of Birth: [birth year]   Medicare Important Message Given:  Yes     [name] 08/14/2019, 11:09 AM

## 2019-08-14 NOTE — Progress Notes (Signed)
PROGRESS NOTE                                                                                                                                                                                                             Patient Demographics:    Joel Wilson, is a 78 y.o. male, DOB - June 20, 1941, [id number]  Admit date - 08/10/2019   Admitting Physician Ivor Costa, MD  Outpatient Primary MD for the patient is [facility]  LOS - 3  Outpatient Specialists: None  Chief Complaint  Patient presents with   Weakness       Brief Narrative 78 year old male resident of group home with history of hypertension, hyperlipidemia, paroxysmal A. fib not on anticoagulation, CAD, BPH, chronic kidney disease stage III, chronic low back pain presented with fall and weakness for several days.  In the ED was found to have oxygen desaturation to 87% on room air.  Blood work showed WC of 14.5K, acute kidney injury, elevated blood pressure of 237/200 mmHg.  Chest x-ray concerning for left-sided infiltrate.  Admitted for further management.   Subjective:   Still having off-and-on back pain and complains of legs swelling again.  Denies any shortness of breath.   Assessment  & Plan :    Principal Problem: Acute respiratory failure with hypoxia (HCC) SIRS (HCC) Secondary to left lobar pneumonia.  Continue empiric Rocephin and doxycycline.  COVID-19 tested negative. blood cultures negative for growth.  Continue as needed antitussives.   Active Problems: Generalized weakness with fall/rhabdomyolysis Patient refused head CT and CT of the cervical spine.  Likely combination of pneumonia, dehydration and low back pain (chronic).  Seen by PT and recommends SNF.  Initially refusing now agrees to go to SNF. CPK elevated to 600.  Statin held.  Received IV hydration but was discontinued as patient developed fluid overload.  Acute kidney  injury superimposed on stage IIIa chronic kidney disease (HCC) Suspect prerenal with dehydration.  Improved with gentle hydration.  Continue Lasix and lisinopril.  BPH Continue Flomax and Proscar.  Paroxysmal A. fib Not on anticoagulation.  Rate controlled on metoprolol, amiodarone and Cardizem  Hypoechoic left lower lobe lesion/distended gallbladder No gallstones or sludge.  LFTs normal.  AFP negative.  Outpatient follow-up.  Essential hypertension Continue metoprolol and lisinopril.  Stage II bilateral buttock wound Wound care consult appreciated.  Chronic low back pain Continue oxycodone as needed   Code Status : Full code  Family Communication  : Son updated on the phone  Disposition Plan  : PT recommends SNF.  Patient now agrees.  Should be ready for discharge tomorrow if symptoms continue to improve.  Barriers For Discharge : Active symptoms  Consults  : None  Procedures  : CT angiogram of the chest, ultrasound abdomen  DVT Prophylaxis  :  Lovenox -   Lab Results  Component Value Date   PLT 192 08/12/2019    Antibiotics  :    Anti-infectives (From admission, onward)   Start     Dose/Rate Route Frequency Ordered Stop   08/12/19 2000  doxycycline (VIBRA-TABS) tablet 100 mg     100 mg Oral Every 12 hours 08/12/19 1429     08/12/19 1000  doxycycline (VIBRAMYCIN) 100 mg in sodium chloride 0.9 % 250 mL IVPB  Status:  Discontinued     100 mg 125 mL/hr over 120 Minutes Intravenous Every 12 hours 08/11/19 1717 08/12/19 1429   08/12/19 0800  cefTRIAXone (ROCEPHIN) 1 g in sodium chloride 0.9 % 100 mL IVPB     1 g 200 mL/hr over 30 Minutes Intravenous Every 24 hours 08/11/19 1717     08/11/19 0445  azithromycin (ZITHROMAX) 500 mg in sodium chloride 0.9 % 250 mL IVPB     500 mg 250 mL/hr over 60 Minutes Intravenous  Once 08/11/19 0437 08/11/19 0656   08/11/19 0445  cefTRIAXone (ROCEPHIN) 1 g in sodium chloride 0.9 % 100 mL IVPB     1 g 200 mL/hr over 30 Minutes  Intravenous  Once 08/11/19 0437 08/11/19 0536        Objective:   Vitals:   08/13/19 0423 08/13/19 0737 08/13/19 2012 08/14/19 0343  BP: (!) 150/52 (!) 153/66 (!) 168/66 (!) 115/46  Pulse: 61 62 69 61  Resp:      Temp: 98.7 F (37.1 C) 98.3 F (36.8 C) 99 F (37.2 C) 98.8 F (37.1 C)  TempSrc: Oral Oral Oral Oral  SpO2: 96% 98% 94% 92%  Weight:      Height:        Wt Readings from Last 3 Encounters:  08/10/19 99.8 kg  01/31/17 98 kg  01/15/17 98 kg     Intake/Output Summary (Last 24 hours) at 08/14/2019 1308 Last data filed at 08/14/2019 0956 Gross per 24 hour  Intake 120 ml  Output 225 ml  Net -105 ml   Physical exam Elderly male appears fatigued, not in distress HEENT: Moist mucosa, supple neck Chest: Clear CVS: Normal S1-S2 GI: Soft, nondistended, nontender Musculoskeletal: Trace bilateral edema      Data Review:    CBC Recent Labs  Lab 08/10/19 2158 08/12/19 0329  WBC 14.5* 11.8*  HGB 12.1* 11.7*  HCT 36.9* 33.7*  PLT 217 192  MCV 96.1 93.4  MCH 31.5 32.4  MCHC 32.8 34.7  RDW 12.6 12.4    Chemistries  Recent Labs  Lab 08/10/19 2158 08/12/19 0329 08/13/19 0647  NA 140 144 144  K 3.7 3.8 3.9  CL 106 108 105  CO2 26 24 27   GLUCOSE 113* 115* 103*  BUN 37* 20 23  CREATININE 1.71* 1.09 1.02  CALCIUM 9.3 8.7* 9.0  AST 34  --   --   ALT 16  --   --   ALKPHOS 45  --   --   BILITOT 0.8  --   --    ------------------------------------------------------------------------------------------------------------------  No results for input(s): CHOL, HDL, LDLCALC, TRIG, CHOLHDL, LDLDIRECT in the last 72 hours.  Lab Results  Component Value Date   HGBA1C 5.6 12/28/2016   ------------------------------------------------------------------------------------------------------------------ No results for input(s): TSH, T4TOTAL, T3FREE, THYROIDAB in the last 72 hours.  Invalid input(s):  FREET3 ------------------------------------------------------------------------------------------------------------------ No results for input(s): VITAMINB12, FOLATE, FERRITIN, TIBC, IRON, RETICCTPCT in the last 72 hours.  Coagulation profile No results for input(s): INR, PROTIME in the last 168 hours.  No results for input(s): DDIMER in the last 72 hours.  Cardiac Enzymes No results for input(s): CKMB, TROPONINI, MYOGLOBIN in the last 168 hours.  Invalid input(s): CK ------------------------------------------------------------------------------------------------------------------    Component Value Date/Time   BNP 202.0 (H) 08/11/2019 1209    Inpatient Medications  Scheduled Meds:  amiodarone  200 mg Oral QHS   calcium-vitamin D  1 tablet Oral Daily   cholecalciferol  2,000 Units Oral Daily   clopidogrel  75 mg Oral Daily   guaiFENesin  600 mg Oral BID   And   dextromethorphan  30 mg Oral BID   diltiazem  240 mg Oral Daily   doxycycline  100 mg Oral Q12H   enoxaparin (LOVENOX) injection  40 mg Subcutaneous Q24H   finasteride  5 mg Oral Daily   furosemide  20 mg Oral Daily   gabapentin  1,200 mg Oral TID   hydrocerin  1 application Topical Daily   lidocaine  1 patch Transdermal Daily   lisinopril  10 mg Oral Daily   loratadine  10 mg Oral Daily   Melatonin  5 mg Oral QHS   metoprolol succinate  12.5 mg Oral BID   tamsulosin  0.4 mg Oral Daily   triamcinolone ointment  1 application Topical BID   vitamin B-12  1,000 mcg Oral Daily   Continuous Infusions:  cefTRIAXone (ROCEPHIN)  IV Stopped (08/14/19 1302)   PRN Meds:.acetaminophen **OR** acetaminophen, albuterol, alum & mag hydroxide-simeth, guaiFENesin-dextromethorphan, hydrALAZINE, loperamide, nitroGLYCERIN, ondansetron (ZOFRAN) IV, oxyCODONE, polyethylene glycol, sodium chloride  Micro Results Recent Results (from the past 240 hour(s))  SARS CORONAVIRUS 2 (TAT 6-24 HRS) Nasopharyngeal  Nasopharyngeal Swab     Status: None   Collection Time: 08/11/19  4:58 AM   Specimen: Nasopharyngeal Swab  Result Value Ref Range Status   SARS Coronavirus 2 NEGATIVE NEGATIVE Final    Comment: (NOTE) SARS-CoV-2 target nucleic acids are NOT DETECTED. The SARS-CoV-2 RNA is generally detectable in upper and lower respiratory specimens during the acute phase of infection. Negative results do not preclude SARS-CoV-2 infection, do not rule out co-infections with other pathogens, and should not be used as the sole basis for treatment or other patient management decisions. Negative results must be combined with clinical observations, patient history, and epidemiological information. The expected result is Negative. Fact Sheet for Patients: SugarRoll.be Fact Sheet for Healthcare Providers: https://www.woods-mathews.com/ This test is not yet approved or cleared by the [place] FDA and  has been authorized for detection and/or diagnosis of SARS-CoV-2 by FDA under an Emergency Use Authorization (EUA). This EUA will remain  in effect (meaning this test can be used) for the duration of the COVID-19 declaration under Section 56 4(b)(1) of the Act, 21 U.S.C. section 360bbb-3(b)(1), unless the authorization is terminated or revoked sooner. Performed at [facility], [place] [address] [place], [place] [address]   CULTURE, BLOOD (ROUTINE X 2) w Reflex to ID Panel     Status: None (Preliminary result)   Collection Time: 08/11/19 10:36 AM   Specimen: BLOOD  RIGHT ARM  Result Value Ref Range Status   Specimen Description BLOOD RIGHT ARM  Final   Special Requests   Final    BOTTLES DRAWN AEROBIC AND ANAEROBIC Blood Culture adequate volume   Culture   Final    NO GROWTH 3 DAYS Performed at [facility], [address], [place], [place] [address]    Report Status PENDING  Incomplete  CULTURE, BLOOD (ROUTINE X 2) w Reflex to ID Panel      Status: None (Preliminary result)   Collection Time: 08/11/19 10:36 AM   Specimen: BLOOD LEFT ARM  Result Value Ref Range Status   Specimen Description BLOOD LEFT ARM  Final   Special Requests   Final    BOTTLES DRAWN AEROBIC AND ANAEROBIC Blood Culture adequate volume   Culture   Final    NO GROWTH 3 DAYS Performed at [facility], [address] [place], [place] [address]    Report Status PENDING  Incomplete    Radiology Reports DG Chest Port 1 View  Result Date: 08/12/2019 CLINICAL DATA:  Shortness of breath. Heart disease. Atrial fibrillation. Ex-smoker. EXAM: PORTABLE CHEST 1 VIEW COMPARISON:  08/10/2019 FINDINGS: Midline trachea. Borderline cardiomegaly. Atherosclerosis in the transverse aorta. No pleural effusion or pneumothorax. Development of relatively diffuse interstitial prominence with perihilar airspace and interstitial opacities. IMPRESSION: Interstitial and airspace disease bilaterally. Favor pulmonary edema. Especially given lack of pleural fluid, atypical infection is a secondary consideration. Electronically Signed   By: Abigail Miyamoto M.D.   On: 08/12/2019 15:49   DG Chest Portable 1 View  Result Date: 08/10/2019 CLINICAL DATA:  The chest pain. Progressive weakness. EXAM: PORTABLE CHEST 1 VIEW COMPARISON:  Chest radiograph 10/10/2015 FINDINGS: The cardiomediastinal contours are unchanged. Streaky opacities in the left mid lower lung zone, present on prior exam but increased. Pulmonary vasculature is normal. No confluent consolidation, pleural effusion, or pneumothorax. No acute osseous abnormalities are seen. IMPRESSION: Streaky opacities in the left mid lower lung zone, present on 2017 exam but increased from. Findings likely represent atelectasis or atypical pneumonia superimposed on scarring. Electronically Signed   By: Keith Rake M.D.   On: 08/10/2019 23:16   CT Angio Chest/Abd/Pel for Dissection W and/or Wo Contrast  Result Date: 08/11/2019 CLINICAL  DATA:  Acute chest and back pain, aortic dissection suspected. Patient reports generalized weakness. EXAM: CT ANGIOGRAPHY CHEST, ABDOMEN AND PELVIS TECHNIQUE: Multidetector CT imaging through the chest, abdomen and pelvis was performed using the standard protocol during bolus administration of intravenous contrast. Multiplanar reconstructed images and MIPs were obtained and reviewed to evaluate the vascular anatomy. CONTRAST:  65mL OMNIPAQUE IOHEXOL 350 MG/ML SOLN COMPARISON:  Radiograph yesterday. FINDINGS: CTA CHEST FINDINGS Cardiovascular: Aortic atherosclerosis and tortuosity. No aortic dissection, hematoma, or evidence of acute aortic syndrome. Left vertebral artery arises directly from the thoracic aorta, variant arch anatomy. Mild multi chamber cardiomegaly. There are coronary artery calcifications, possible coronary stents. Mitral annulus calcifications. Limited assessment for pulmonary embolus given phase of contrast, no filling defects in the pulmonary arteries to the lobar level. Mediastinum/Nodes: No enlarged mediastinal or hilar lymph nodes. No esophageal wall thickening. No suspicious thyroid nodule. Lungs/Pleura: Linear atelectasis in both lower lobes and the lingula. Mild hypoventilatory atelectasis dependently. Trace pleural thickening without frank pleural effusion. No pulmonary edema or septal thickening. No pulmonary mass. Trachea and mainstem bronchi are patent. Musculoskeletal: There are no acute or suspicious osseous abnormalities. Degenerative change in the thoracic spine. No compression deformity Review of the MIP images confirms  the above findings. CTA ABDOMEN AND PELVIS FINDINGS VASCULAR Aorta: Normal in caliber with moderate atherosclerosis. No aneurysm, dissection, or evidence of vasculitis. Celiac: Patent with mild plaque at the origin. No dissection or aneurysm. Branch vessels are patent. SMA: Patent with mild plaque at the origin. Mesenteric branches are patent. Renals: Single right  renal artery. Three left renal arteries, 2 codominant and a tiny accessory to the upper pole. All renal arteries are patent with mild atheromatous plaque. No dissection or evidence of FMD. IMA: Patent without evidence of aneurysm, dissection, vasculitis or significant stenosis. Inflow: Patent without evidence of aneurysm, dissection, vasculitis or significant stenosis. Veins: No obvious venous abnormality within the limitations of this arterial phase study. Review of the MIP images confirms the above findings. NON-VASCULAR Hepatobiliary: 9 mm hypodensity in the left lobe, nonspecific and too small to accurately characterize. Calcified granuloma in the dome of the liver. Distended gallbladder. Possible small layering stones or sludge, series 5, image 80. Mild common bile duct dilatation at 10 mm. No visualized choledocholithiasis. Pancreas: Unremarkable. No pancreatic ductal dilatation or surrounding inflammatory changes. Spleen: Normal in size. Normal arterial enhancement. Adrenals/Urinary Tract: Normal adrenal glands. No hydronephrosis. Trace bilateral symmetric perinephric edema. Urinary bladder is unremarkable. Stomach/Bowel: Decompressed stomach. No small bowel obstruction or inflammatory change. Normal appendix. Moderate volume of colonic stool. No colonic inflammation. Diverticulosis of the distal descending and sigmoid colon. No evidence of diverticulitis. There is sigmoid colonic tortuosity. Lymphatic: No adenopathy. Reproductive: Prostate is unremarkable. Other: No free air, free fluid, or intra-abdominal fluid collection. Musculoskeletal: Multilevel degenerative change throughout the lumbar spine. There are no acute or suspicious osseous abnormalities. Chronic irregularity of the right anterior iliac crest. Chronic calcifications in the anterior left upper thigh musculature, partially included, likely sequela of remote prior injury. Review of the MIP images confirms the above findings. IMPRESSION: 1.  Thoracoabdominal aortic atherosclerosis. No aneurysm, dissection, or acute aortic abnormality. Coronary artery calcifications versus stents. 2. Distended gallbladder with possible small layering stones or sludge. Mild common bile duct dilatation at 10 mm, no visualized choledocholithiasis. Recommend correlation with LFTs. Consider further evaluation with right upper quadrant ultrasound. 3. Distal colonic diverticulosis without diverticulitis. 4. Streaky atelectasis in both lower lobes and lingula. Aortic Atherosclerosis (ICD10-I70.0). Electronically Signed   By: Keith Rake M.D.   On: 08/11/2019 01:02   US Abdomen Limited RUQ  Result Date: 08/11/2019 CLINICAL DATA:  Gallstones on CT.  Chest pain. EXAM: ULTRASOUND ABDOMEN LIMITED RIGHT UPPER QUADRANT COMPARISON:  None. FINDINGS: Gallbladder: Distended. No gallstones or sludge. No wall thickening. No sonographic Murphy sign noted by sonographer. Common bile duct: Diameter: 3 mm proximally, flares distally to 7 mm. Liver: Hypoechoic lesion in the left lobe of the liver measures 11 x 8 x 9 mm, corresponding to a hypodense lesion on CT, not well characterized sonographically. No other focal lesion. Within normal limits in parenchymal echogenicity. Portal vein is patent on color Doppler imaging with normal direction of blood flow towards the liver. Other: None. IMPRESSION: 1. Distended gallbladder but no gallstones, sludge, or gallbladder wall thickening. 2. Small 11 mm hypoechoic lesion in the left lobe of the liver, not well characterized sonographically but likely cyst. Electronically Signed   By: Keith Rake M.D.   On: 08/11/2019 06:34    Time Spent in minutes 25   Alfretta Pinch M.D on 08/14/2019 at 1:08 PM  Between 7am to 7pm - Pager - [phone]  After 7pm go to [email or web address] - password [email or web address]  [facility] -  Office  [phone] ° ° °  °

## 2019-08-14 NOTE — Clinical Social Work Note (Addendum)
Per MD, patient now agreeable to SNF placement. Tried calling in room but no answer. Will try again later.  Dayton Scrape, CSW [phone]  2:26 pm Tried calling in room again. No answer. Patient has two bed offers so far: [facility] and [facility]. [facility] does not take weekend admissions.  Dayton Scrape, CSW [phone]  3:17 pm  Tried calling in room again. No answer. [facility] is his only option at this time if discharging tomorrow. They confirmed they can take him. CSW coworker will notify patient.  Dayton Scrape, [place]  3:40 pm: CSW coworker talked to patient. He is agreeable to [facility]. Reminded MD about [place], [place]

## 2019-08-14 NOTE — Evaluation (Signed)
Occupational Therapy Evaluation Patient Details Name: Joel Wilson. MRN: [id number] DOB: [birth year] Today's Date: 08/14/2019    History of Present Illness 78 yo male admitted after presenting to ED after worsening generalized weakness and having a fall trying to pivot from w/c to toilet, No LOC, pt refused CT of head or neck. PMH includes HTN, HLD, CAD, PAF not on anticoagulants   Clinical Impression   Pt seen for OT evaluation this date. Prior to hospital admission, pt was MOD I with ADL transfers, performed all BADLs I'ly and had some assist through facility for IADLs-meals provided (Pt states [employer] is where he resides).  Pt primarily used w/c for fxl mobility and would SPS from w/c to commode with use of grab bars.  Currently pt demonstrates impairments in sitting balance and tolerance as well as global strength requiring MIN A for UB ADLs and MAX A for LB ADLs.  Pt would benefit from skilled OT to address noted impairments and functional limitations (see below for any additional details) in order to maximize safety and independence while minimizing falls risk and caregiver burden.  Upon hospital discharge, recommend pt discharge to SNF.    Follow Up Recommendations  SNF    Equipment Recommendations  Other (comment)(defer to next level of care)    Recommendations for Other Services       Precautions / Restrictions Precautions Precautions: Fall Restrictions Weight Bearing Restrictions: No      Mobility Bed Mobility Overal bed mobility: Needs Assistance Bed Mobility: Supine to Sit;Sit to Supine     Supine to sit: Mod assist Sit to supine: Max assist      Transfers                 General transfer comment: pt declines to participate in transfers with OT on assessment.    Balance Overall balance assessment: Needs assistance Sitting-balance support: Bilateral upper extremity supported;Feet supported Sitting balance-Leahy Scale: Fair          Standing balance comment: unable to assess                           ADL either performed or assessed with clinical judgement   ADL Overall ADL's : Needs assistance/impaired Eating/Feeding: Minimal assistance;Sitting Eating/Feeding Details (indicate cue type and reason): demos ROM appropraite for hand to mouth, but states that coca-cola bottle is too heavy. OT attempts to have pt perform with setup, but pt ultimately needs MIN  A for hand to mouth with full coke bottle. Grooming: Wash/dry hands;Wash/dry face;Oral care;Set up;Sitting   Upper Body Bathing: Minimal assistance;Sitting   Lower Body Bathing: Maximal assistance;Sitting/lateral leans   Upper Body Dressing : Minimal assistance;Sitting   Lower Body Dressing: Maximal assistance;Sitting/lateral leans Lower Body Dressing Details (indicate cue type and reason): pt able to pull up socks   Toilet Transfer Details (indicate cue type and reason): pt declines to perform t/f with OT on assessment.                 Vision Baseline Vision/History: Wears glasses Wears Glasses: At all times Patient Visual Report: No change from baseline       Perception     Praxis      Pertinent Vitals/Pain Pain Assessment: Faces Faces Pain Scale: Hurts even more Pain Location: back with mobility Pain Descriptors / Indicators: Grimacing;Discomfort;Aching Pain Intervention(s): Limited activity within patient's tolerance;Monitored during session;Premedicated before session;Repositioned     Hand Dominance  Extremity/Trunk Assessment Upper Extremity Assessment Upper Extremity Assessment: Generalized weakness;RUE deficits/detail;LUE deficits/detail RUE Deficits / Details: shoudler 3-/5, elbow 4-/5, grip 4/5 LUE Deficits / Details: shoudler 3-/5, elbow 4-/5, grip 4/5   Lower Extremity Assessment Lower Extremity Assessment: Defer to PT evaluation;Generalized weakness       Communication Communication Communication: No  difficulties   Cognition Arousal/Alertness: Awake/alert Behavior During Therapy: Agitated Overall Cognitive Status: Within Functional Limits for tasks assessed                                 General Comments: pt intermittently becomes aggitated with recommnedations, but gentle re-direction helps   General Comments       Exercises Other Exercises Other Exercises: OT engages pt in 1 set, 20 reps shoulder flexion to 90 degrees with pt requiring MIN verbal cues for form and pace. Some increased WOB with activity.   Shoulder Instructions      Home Living Family/patient expects to be discharged to:: Skilled nursing facility                                 Additional Comments: lived in      Prior Functioning/Environment Level of Independence: Needs assistance  Gait / Transfers Assistance Needed: Pt reports using w/c x5 years ADL's / Homemaking Assistance Needed: Reports sponge-bathing at baseline because he can't safely sit in shower chair. Pt unclear on how he would put on pants, socks and shoes. States he would have to do it one leg at a time. States he would use grab bars to pull himself from w/c to commode and back-doesn't come to full stand.            OT Problem List: Decreased strength;Decreased range of motion;Decreased activity tolerance;Impaired balance (sitting and/or standing);Pain;Decreased safety awareness      OT Treatment/Interventions: Self-care/ADL training;Therapeutic exercise;Energy conservation;DME and/or AE instruction;Therapeutic activities;Patient/family education;Balance training    OT Goals(Current goals can be found in the care plan section) Acute Rehab OT Goals Patient Stated Goal: get stronger OT Goal Formulation: With patient Time For Goal Achievement: 08/28/19 Potential to Achieve Goals: Good  OT Frequency: Min 1X/week   Barriers to D/C:            Co-evaluation              AM-PAC OT "6 Clicks" Daily  Activity     Outcome Measure Help from another person eating meals?: A Little Help from another person taking care of personal grooming?: A Little Help from another person toileting, which includes using toliet, bedpan, or urinal?: A Lot Help from another person bathing (including washing, rinsing, drying)?: A Lot Help from another person to put on and taking off regular upper body clothing?: A Little Help from another person to put on and taking off regular lower body clothing?: A Lot 6 Click Score: 15   End of Session Nurse Communication: Mobility status  Activity Tolerance: Patient tolerated treatment well(pt somehwat self-limiting) Patient left: with call bell/phone within reach;with bed alarm set  OT Visit Diagnosis: Unsteadiness on feet (R26.81);Muscle weakness (generalized) (M62.81);History of falling (Z91.81)                Time: [id number] OT Time Calculation (min): 43 min Charges:  OT General Charges $OT Visit: 1 Visit OT Evaluation $OT Eval Moderate Complexity: 1 Mod OT Treatments $Self Care/Home Management :  8-22 mins $Therapeutic Activity: 8-22 mins  Gerrianne Scale, MS, OTR/L ascom [phone] 08/14/19, 2:12 PM

## 2019-08-14 NOTE — Plan of Care (Signed)
  Problem: Activity: Goal: Ability to tolerate increased activity will improve Outcome: Progressing   Problem: Clinical Measurements: Goal: Ability to maintain a body temperature in the normal range will improve Outcome: Progressing   Problem: Respiratory: Goal: Ability to maintain adequate ventilation will improve Outcome: Progressing Goal: Ability to maintain a clear airway will improve Outcome: Progressing   Problem: Pain Managment: Goal: General experience of comfort will improve Outcome: Progressing   Problem: Safety: Goal: Ability to remain free from injury will improve Outcome: Progressing   Problem: Skin Integrity: Goal: Risk for impaired skin integrity will decrease Outcome: Progressing   Problem: Education: Goal: Knowledge of General Education information will improve Description: Including pain rating scale, medication(s)/side effects and non-pharmacologic comfort measures Outcome: Progressing   Problem: Clinical Measurements: Goal: Ability to maintain clinical measurements within normal limits will improve Outcome: Progressing

## 2019-08-14 NOTE — Progress Notes (Signed)
PT Cancellation Note  Patient Details Name: Joel Wilson. MRN: [id number] DOB: 02-Sep-1941   Cancelled Treatment:    Reason Eval/Treat Not Completed: Patient declined, no reason specified(Treatment session attempted.  Patient refused participation with treatment session, reporting he just completed OT ("she worked me out pretty good").  Now, just "wants to take a nap". Unable to redirect despite encouragement.  Will continue efforts at later time/date as medically appropriate and available.)  Boaz Berisha H. Owens Shark, PT, DPT, NCS 08/14/19, 2:37 PM [phone]

## 2019-08-14 NOTE — NC FL2 (Signed)
[place] LEVEL OF CARE SCREENING TOOL     IDENTIFICATION  Patient Name: Joel Wilson. Birthdate: October 09, 1940 Sex: male Admission Date (Current Location): 08/10/2019  [place] and [place] Number:  Engineering geologist and Address:  [facility], [address] [place], [place] [address]      Provider Number: [id number]  Attending Physician Name and Address:  Louellen Molder, MD  Relative Name and Phone Number:       Current Level of Care: Hospital Recommended Level of Care: [place] Prior Approval Number:    Date Approved/Denied:   PASRR Number: [id number]  Discharge Plan: SNF    Current Diagnoses: Patient Active Problem List   Diagnosis Date Noted  . Pressure injury of skin 08/12/2019  . Fall 08/11/2019  . Acute respiratory failure with hypoxia ([place]) 08/11/2019  . Generalized weakness 08/11/2019  . Liver lesion 08/11/2019  . Acute renal failure superimposed on stage 3a chronic kidney disease ([place]) 08/11/2019  . HTN (hypertension) 08/11/2019  . Seasonal allergic rhinitis 01/03/2017  . Chronic pain syndrome 05/16/2016  . Chronic lumbar pain 03/23/2016  . Abnormality of gait 12/09/2015  . Insomnia 12/09/2015  . Vitamin D deficiency 11/08/2015  . Peripheral neuropathy 11/08/2015  . BPH without obstruction/lower urinary tract symptoms 11/08/2015  . Hypertensive heart disease   . Paroxysmal atrial fibrillation (HCC)   . Long term (current) use of anticoagulants 09/07/2013  . Atrial fibrillation ([place]) 09/04/2013  . Coronary artery disease   . Hyperlipidemia     Orientation RESPIRATION BLADDER Height & Weight     Self, Time, Situation, Place  O2(Nasal Canula 3 L) Incontinent, External catheter Weight: 220 lb (99.8 kg) Height:  5\' 9"  (175.3 cm)  BEHAVIORAL SYMPTOMS/MOOD NEUROLOGICAL BOWEL NUTRITION STATUS  (None) (None) Incontinent Diet(Heart healthy)  AMBULATORY STATUS COMMUNICATION OF NEEDS Skin    Extensive Assist Verbally Skin abrasions, Bruising, Other (Comment), PU Stage and Appropriate Care(Cracking, MASD. Unstageable pressure injury on right heel: Foam (lift every shift to assess).)     PU Stage 3 Dressing: (Left and right buttocks: Foam (lift to assess every shift).)                 Personal Care Assistance Level of Assistance  Bathing, Feeding, Dressing Bathing Assistance: Maximum assistance Feeding assistance: Limited assistance Dressing Assistance: Maximum assistance     Functional Limitations Info  Sight, Hearing, Speech Sight Info: Adequate Hearing Info: Adequate Speech Info: Adequate    SPECIAL CARE FACTORS FREQUENCY  PT (By licensed PT), OT (By licensed OT)     PT Frequency: 5 x week OT Frequency: 5 x week            Contractures Contractures Info: Not present    Additional Factors Info  Code Status, Allergies Code Status Info: Full code Allergies Info: Aspirin           Current Medications (08/14/2019):  This is the current hospital active medication list Current Facility-Administered Medications  Medication Dose Route Frequency Provider Last Rate Last Admin  . acetaminophen (TYLENOL) tablet 650 mg  650 mg Oral Q6H PRN Ivor Costa, MD   650 mg at 08/14/19 [id number]   Or  . acetaminophen (TYLENOL) suppository 650 mg  650 mg Rectal Q6H PRN Ivor Costa, MD      . albuterol (PROVENTIL) (2.5 MG/3ML) 0.083% nebulizer solution 2.5 mg  2.5 mg Nebulization Q4H PRN Lu Duffel, RPH   2.5 mg at 08/12/19 1552  . alum &  mag hydroxide-simeth (MAALOX/MYLANTA) 200-200-20 MG/5ML suspension 30 mL  30 mL Oral Q6H PRN Ivor Costa, MD      . amiodarone (PACERONE) tablet 200 mg  200 mg Oral QHS Ivor Costa, MD   200 mg at 08/13/19 2155  . calcium-vitamin D (OSCAL WITH D) 500-200 MG-UNIT per tablet 1 tablet  1 tablet Oral Daily Ivor Costa, MD   1 tablet at 08/14/19 1003  . cefTRIAXone (ROCEPHIN) 1 g in sodium chloride 0.9 % 100 mL IVPB  1 g Intravenous Q24H Ivor Costa, MD   Stopped at 08/14/19 1302  . cholecalciferol (VITAMIN D) tablet 2,000 Units  2,000 Units Oral Daily Dhungel, Nishant, MD   2,000 Units at 08/14/19 1003  . clopidogrel (PLAVIX) tablet 75 mg  75 mg Oral Daily Ivor Costa, MD   75 mg at 08/14/19 1003  . guaiFENesin (MUCINEX) 12 hr tablet 600 mg  600 mg Oral BID Lu Duffel, RPH   600 mg at 08/14/19 1005   And  . dextromethorphan (DELSYM) 30 MG/5ML liquid 30 mg  30 mg Oral BID Lu Duffel, RPH   30 mg at 08/14/19 1005  . diltiazem (CARDIZEM CD) 24 hr capsule 240 mg  240 mg Oral Daily Ivor Costa, MD   240 mg at 08/14/19 1004  . doxycycline (VIBRA-TABS) tablet 100 mg  100 mg Oral Q12H Dhungel, Nishant, MD   100 mg at 08/14/19 1003  . enoxaparin (LOVENOX) injection 40 mg  40 mg Subcutaneous Q24H Dhungel, Nishant, MD   40 mg at 08/13/19 2155  . finasteride (PROSCAR) tablet 5 mg  5 mg Oral Daily Ivor Costa, MD   5 mg at 08/14/19 1003  . furosemide (LASIX) tablet 20 mg  20 mg Oral Daily Dhungel, Nishant, MD   20 mg at 08/14/19 1003  . gabapentin (NEURONTIN) tablet 1,200 mg  1,200 mg Oral TID Ivor Costa, MD   1,200 mg at 08/14/19 1004  . guaiFENesin-dextromethorphan (ROBITUSSIN DM) 100-10 MG/5ML syrup 5 mL  5 mL Oral Q4H PRN Dhungel, Nishant, MD   5 mL at 08/12/19 1454  . hydrALAZINE (APRESOLINE) tablet 25 mg  25 mg Oral TID PRN Ivor Costa, MD      . hydrocerin (EUCERIN) cream 1 application  1 application Topical Daily Ivor Costa, MD   1 application at [id number]  . lidocaine (LIDODERM) 5 % 1 patch  1 patch Transdermal Daily Ivor Costa, MD   1 patch at 08/14/19 1014  . lisinopril (ZESTRIL) tablet 10 mg  10 mg Oral Daily Dhungel, Nishant, MD   10 mg at 08/14/19 1004  . loperamide (IMODIUM) capsule 2 mg  2 mg Oral PRN Ivor Costa, MD      . loratadine (CLARITIN) tablet 10 mg  10 mg Oral Daily Ivor Costa, MD   10 mg at 08/14/19 1003  . Melatonin TABS 5 mg  5 mg Oral QHS Ivor Costa, MD   5 mg at 08/13/19 2155  . metoprolol  succinate (TOPROL-XL) 24 hr tablet 12.5 mg  12.5 mg Oral BID Ivor Costa, MD   12.5 mg at 08/14/19 1006  . nitroGLYCERIN (NITROSTAT) SL tablet 0.4 mg  0.4 mg Sublingual Q5 min PRN Ivor Costa, MD      . ondansetron [facility]) injection 4 mg  4 mg Intravenous Q8H PRN Ivor Costa, MD   4 mg at 08/14/19 [id number]  . oxyCODONE (Oxy IR/ROXICODONE) immediate release tablet 7.5 mg  7.5 mg Oral Q6H PRN Dhungel, Nishant, MD  7.5 mg at 08/14/19 1203  . polyethylene glycol (MIRALAX / GLYCOLAX) packet 17 g  17 g Oral Daily PRN Ivor Costa, MD      . sodium chloride (OCEAN) 0.65 % nasal spray 1 spray  1 spray Each Nare PRN Ivor Costa, MD   1 spray at 08/13/19 1159  . tamsulosin (FLOMAX) capsule 0.4 mg  0.4 mg Oral Daily Ivor Costa, MD   0.4 mg at 08/14/19 1004  . triamcinolone ointment (KENALOG) 0.1 % 1 application  1 application Topical BID Ivor Costa, MD   1 application at [id number]  . vitamin B-12 (CYANOCOBALAMIN) tablet 1,000 mcg  1,000 mcg Oral Daily Ivor Costa, MD   1,000 mcg at 08/14/19 1004     Discharge Medications: Please see discharge summary for a list of discharge medications.  Relevant Imaging Results:  Relevant Lab Results:   Additional Information SS#: [id number]. Lives at [facility].  Candie Chroman, LCSW

## 2019-08-15 DIAGNOSIS — R531 Weakness: Secondary | ICD-10-CM

## 2019-08-15 LAB — CK: Total CK: 177 U/L (ref 49–397)

## 2019-08-15 MED ORDER — OXYCODONE HCL 20 MG PO TABS: 10.0000 mg | ORAL_TABLET | Freq: Three times a day (TID) | ORAL | 0 refills | Status: DC

## 2019-08-15 MED ORDER — DOXYCYCLINE HYCLATE 100 MG PO TABS: 100.0000 mg | ORAL_TABLET | Freq: Two times a day (BID) | ORAL | 0 refills | Status: AC

## 2019-08-15 MED ORDER — SIMVASTATIN 20 MG PO TABS: 10.0000 mg | ORAL_TABLET | Freq: Every day | ORAL | 3 refills | Status: DC

## 2019-08-15 NOTE — TOC Progression Note (Addendum)
Transition of Care (TOC) - Progression Note    Patient Details  Name: Joel Wilson. MRN: [id number] Date of Birth: 13-Jun-1941  Transition of Care [facility]) CM/SW Contact  Marshell Garfinkel, RN Phone Number: 08/15/2019, 11:18 AM  Clinical Narrative:     RNCM spoke with patient about transfer to [facility] and patient said he "was not going to [facility] care; I told them I wanted [facility]".  I spoke with Magda Paganini at [facility] and they will have a bed available Monday. MD updated. Update: Magda Paganini from [facility] stating that they cannot accept patient due to "covid outbreak at [employer] (where patient is from).  I have shared this with patient and he agrees. I spoke with [facility] and they can accept him tomorrow. Patient states he needs an air mattress- [place] is aware and I will speak with MD about it.  Barriers to Discharge: No SNF bed  Expected Discharge Plan and Services                                         Social Determinants of Health (SDOH) Interventions    Readmission Risk Interventions No flowsheet data found.

## 2019-08-15 NOTE — Progress Notes (Signed)
PROGRESS NOTE                                                                                                                                                                                                             Patient Demographics:    Joel Wilson, is a 78 y.o. male, DOB - December 21, 1940, [id number]  Admit date - 08/10/2019   Admitting Physician Ivor Costa, MD  Outpatient Primary MD for the patient is [facility]  LOS - 4  Outpatient Specialists: None  Chief Complaint  Patient presents with  . Weakness       Brief Narrative 78 year old male resident of group home with history of hypertension, hyperlipidemia, paroxysmal A. fib not on anticoagulation, CAD, BPH, chronic kidney disease stage III, chronic low back pain presented with fall and weakness for several days.  In the ED was found to have oxygen desaturation to 87% on room air.  Blood work showed WC of 14.5K, acute kidney injury, elevated blood pressure of 237/200 mmHg.  Chest x-ray concerning for left-sided infiltrate.  Admitted for further management.   Subjective:   Still has off-and-on low back pain.  Could not be sent to [facility] today due to Covid outbreak there.   Assessment  & Plan :    Principal Problem: Acute respiratory failure with hypoxia (HCC) SIRS (HCC) Secondary to left lobar pneumonia.  Empiric Rocephin and doxycycline, transition to Augmentin upon discharge.  COVID-19 tested negative x 2. blood cultures negative for growth.  Continue as needed antitussives.   Active Problems: Generalized weakness with fall/rhabdomyolysis Patient refused head CT and CT of the cervical spine.  Likely combination of pneumonia, dehydration and low back pain (chronic).  PT recommends SNF.  CPK elevated to 600, statin held, now resolved.  Resume statin upon discharge.  Acute kidney injury superimposed on stage IIIa chronic kidney  disease (HCC) Suspect prerenal with dehydration.  Improved with hydration.  Resumed Lasix and lisinopril.  BPH Continue Flomax and Proscar.  Paroxysmal A. fib Not on anticoagulation.  Rate controlled on metoprolol, amiodarone and Cardizem  Hypoechoic left lower lobe lesion/distended gallbladder No gallstones or sludge.  LFTs normal.  AFP negative.  Outpatient follow-up.  Essential hypertension Continue metoprolol and lisinopril.  Stage II bilateral buttock wound Wound care consult appreciated.  Chronic low back pain Continue oxycodone as  needed   Code Status : Full code  Family Communication  : Son updated on the phone  Disposition Plan  : SNF tomorrow  Barriers For Discharge : Active symptoms  Consults  : None  Procedures  : CT angiogram of the chest, ultrasound abdomen  DVT Prophylaxis  :  Lovenox -   Lab Results  Component Value Date   PLT 192 08/12/2019    Antibiotics  :    Anti-infectives (From admission, onward)   Start     Dose/Rate Route Frequency Ordered Stop   08/15/19 0000  doxycycline (VIBRA-TABS) 100 MG tablet     100 mg Oral Every 12 hours 08/15/19 1120 08/18/19 2359   08/12/19 2000  doxycycline (VIBRA-TABS) tablet 100 mg     100 mg Oral Every 12 hours 08/12/19 1429     08/12/19 1000  doxycycline (VIBRAMYCIN) 100 mg in sodium chloride 0.9 % 250 mL IVPB  Status:  Discontinued     100 mg 125 mL/hr over 120 Minutes Intravenous Every 12 hours 08/11/19 1717 08/12/19 1429   08/12/19 0800  cefTRIAXone (ROCEPHIN) 1 g in sodium chloride 0.9 % 100 mL IVPB     1 g 200 mL/hr over 30 Minutes Intravenous Every 24 hours 08/11/19 1717     08/11/19 0445  azithromycin (ZITHROMAX) 500 mg in sodium chloride 0.9 % 250 mL IVPB     500 mg 250 mL/hr over 60 Minutes Intravenous  Once 08/11/19 0437 08/11/19 0656   08/11/19 0445  cefTRIAXone (ROCEPHIN) 1 g in sodium chloride 0.9 % 100 mL IVPB     1 g 200 mL/hr over 30 Minutes Intravenous  Once 08/11/19 0437 08/11/19  0536        Objective:   Vitals:   08/14/19 1629 08/14/19 2014 08/15/19 0515 08/15/19 0758  BP: (!) 142/52 (!) 166/57 (!) 141/59 (!) 158/63  Pulse: (!) 55 64 60 61  Resp: 17 18 18 18   Temp: 98.4 F (36.9 C) 98.5 F (36.9 C) 98.7 F (37.1 C) 98.4 F (36.9 C)  TempSrc:  Oral Oral Oral  SpO2: 93% 95% 95% 95%  Weight:      Height:        Wt Readings from Last 3 Encounters:  08/10/19 99.8 kg  01/31/17 98 kg  01/15/17 98 kg     Intake/Output Summary (Last 24 hours) at 08/15/2019 1318 Last data filed at 08/15/2019 0608 Gross per 24 hour  Intake --  Output 1450 ml  Net -1450 ml   Physical exam Not in distress HEENT: Moist mucosa, supple neck Chest: Clear bilaterally CVs: Normal S1-S2 GI: Soft, nondistended, nontender Musculoskeletal: Warm, improved edema      Data Review:    CBC Recent Labs  Lab 08/10/19 2158 08/12/19 0329  WBC 14.5* 11.8*  HGB 12.1* 11.7*  HCT 36.9* 33.7*  PLT 217 192  MCV 96.1 93.4  MCH 31.5 32.4  MCHC 32.8 34.7  RDW 12.6 12.4    Chemistries  Recent Labs  Lab 08/10/19 2158 08/12/19 0329 08/13/19 0647  NA 140 144 144  K 3.7 3.8 3.9  CL 106 108 105  CO2 26 24 27   GLUCOSE 113* 115* 103*  BUN 37* 20 23  CREATININE 1.71* 1.09 1.02  CALCIUM 9.3 8.7* 9.0  AST 34  --   --   ALT 16  --   --   ALKPHOS 45  --   --   BILITOT 0.8  --   --    ------------------------------------------------------------------------------------------------------------------  No results for input(s): CHOL, HDL, LDLCALC, TRIG, CHOLHDL, LDLDIRECT in the last 72 hours.  Lab Results  Component Value Date   HGBA1C 5.6 12/28/2016   ------------------------------------------------------------------------------------------------------------------ No results for input(s): TSH, T4TOTAL, T3FREE, THYROIDAB in the last 72 hours.  Invalid input(s):  FREET3 ------------------------------------------------------------------------------------------------------------------ No results for input(s): VITAMINB12, FOLATE, FERRITIN, TIBC, IRON, RETICCTPCT in the last 72 hours.  Coagulation profile No results for input(s): INR, PROTIME in the last 168 hours.  No results for input(s): DDIMER in the last 72 hours.  Cardiac Enzymes No results for input(s): CKMB, TROPONINI, MYOGLOBIN in the last 168 hours.  Invalid input(s): CK ------------------------------------------------------------------------------------------------------------------    Component Value Date/Time   BNP 202.0 (H) 08/11/2019 1209    Inpatient Medications  Scheduled Meds: . amiodarone  200 mg Oral QHS  . calcium-vitamin D  1 tablet Oral Daily  . cholecalciferol  2,000 Units Oral Daily  . clopidogrel  75 mg Oral Daily  . guaiFENesin  600 mg Oral BID   And  . dextromethorphan  30 mg Oral BID  . diltiazem  240 mg Oral Daily  . doxycycline  100 mg Oral Q12H  . enoxaparin (LOVENOX) injection  40 mg Subcutaneous Q24H  . finasteride  5 mg Oral Daily  . furosemide  20 mg Oral Daily  . gabapentin  1,200 mg Oral TID  . hydrocerin  1 application Topical Daily  . lidocaine  1 patch Transdermal Daily  . lisinopril  10 mg Oral Daily  . loratadine  10 mg Oral Daily  . Melatonin  5 mg Oral QHS  . metoprolol succinate  12.5 mg Oral BID  . tamsulosin  0.4 mg Oral Daily  . triamcinolone ointment  1 application Topical BID  . vitamin B-12  1,000 mcg Oral Daily   Continuous Infusions: . cefTRIAXone (ROCEPHIN)  IV 1 g (08/15/19 1010)   PRN Meds:.acetaminophen **OR** acetaminophen, albuterol, alum & mag hydroxide-simeth, guaiFENesin-dextromethorphan, hydrALAZINE, loperamide, nitroGLYCERIN, ondansetron (ZOFRAN) IV, oxyCODONE, polyethylene glycol, sodium chloride  Micro Results Recent Results (from the past 240 hour(s))  SARS CORONAVIRUS 2 (TAT 6-24 HRS) Nasopharyngeal  Nasopharyngeal Swab     Status: None   Collection Time: 08/11/19  4:58 AM   Specimen: Nasopharyngeal Swab  Result Value Ref Range Status   SARS Coronavirus 2 NEGATIVE NEGATIVE Final    Comment: (NOTE) SARS-CoV-2 target nucleic acids are NOT DETECTED. The SARS-CoV-2 RNA is generally detectable in upper and lower respiratory specimens during the acute phase of infection. Negative results do not preclude SARS-CoV-2 infection, do not rule out co-infections with other pathogens, and should not be used as the sole basis for treatment or other patient management decisions. Negative results must be combined with clinical observations, patient history, and epidemiological information. The expected result is Negative. Fact Sheet for Patients: SugarRoll.be Fact Sheet for Healthcare Providers: https://www.woods-mathews.com/ This test is not yet approved or cleared by the [place] FDA and  has been authorized for detection and/or diagnosis of SARS-CoV-2 by FDA under an Emergency Use Authorization (EUA). This EUA will remain  in effect (meaning this test can be used) for the duration of the COVID-19 declaration under Section 56 4(b)(1) of the Act, 21 U.S.C. section 360bbb-3(b)(1), unless the authorization is terminated or revoked sooner. Performed at [facility], [place] [address], [place], [place] [address]   CULTURE, BLOOD (ROUTINE X 2) w Reflex to ID Panel     Status: None (Preliminary result)   Collection Time: 08/11/19 10:36 AM   Specimen:  BLOOD RIGHT ARM  Result Value Ref Range Status   Specimen Description BLOOD RIGHT ARM  Final   Special Requests   Final    BOTTLES DRAWN AEROBIC AND ANAEROBIC Blood Culture adequate volume   Culture   Final    NO GROWTH 4 DAYS Performed at [facility], [address], [place], [place] [address]    Report Status PENDING  Incomplete  CULTURE, BLOOD (ROUTINE X 2) w Reflex to ID Panel      Status: None (Preliminary result)   Collection Time: 08/11/19 10:36 AM   Specimen: BLOOD LEFT ARM  Result Value Ref Range Status   Specimen Description BLOOD LEFT ARM  Final   Special Requests   Final    BOTTLES DRAWN AEROBIC AND ANAEROBIC Blood Culture adequate volume   Culture   Final    NO GROWTH 4 DAYS Performed at [facility], [address], [place], [place] [address]    Report Status PENDING  Incomplete  SARS CORONAVIRUS 2 (TAT 6-24 HRS) Nasopharyngeal Nasopharyngeal Swab     Status: None   Collection Time: 08/14/19  6:06 PM   Specimen: Nasopharyngeal Swab  Result Value Ref Range Status   SARS Coronavirus 2 NEGATIVE NEGATIVE Final    Comment: (NOTE) SARS-CoV-2 target nucleic acids are NOT DETECTED. The SARS-CoV-2 RNA is generally detectable in upper and lower respiratory specimens during the acute phase of infection. Negative results do not preclude SARS-CoV-2 infection, do not rule out co-infections with other pathogens, and should not be used as the sole basis for treatment or other patient management decisions. Negative results must be combined with clinical observations, patient history, and epidemiological information. The expected result is Negative. Fact Sheet for Patients: SugarRoll.be Fact Sheet for Healthcare Providers: https://www.woods-mathews.com/ This test is not yet approved or cleared by the [place] FDA and  has been authorized for detection and/or diagnosis of SARS-CoV-2 by FDA under an Emergency Use Authorization (EUA). This EUA will remain  in effect (meaning this test can be used) for the duration of the COVID-19 declaration under Section 56 4(b)(1) of the Act, 21 U.S.C. section 360bbb-3(b)(1), unless the authorization is terminated or revoked sooner. Performed at [facility], [place] [address] [place], [place] [address]     Radiology Reports DG Chest [place] 1 View  Result Date:  08/12/2019 CLINICAL DATA:  Shortness of breath. Heart disease. Atrial fibrillation. Ex-smoker. EXAM: PORTABLE CHEST 1 VIEW COMPARISON:  08/10/2019 FINDINGS: Midline trachea. Borderline cardiomegaly. Atherosclerosis in the transverse aorta. No pleural effusion or pneumothorax. Development of relatively diffuse interstitial prominence with perihilar airspace and interstitial opacities. IMPRESSION: Interstitial and airspace disease bilaterally. Favor pulmonary edema. Especially given lack of pleural fluid, atypical infection is a secondary consideration. Electronically Signed   By: Abigail Miyamoto M.D.   On: 08/12/2019 15:49   DG Chest Portable 1 View  Result Date: 08/10/2019 CLINICAL DATA:  The chest pain. Progressive weakness. EXAM: PORTABLE CHEST 1 VIEW COMPARISON:  Chest radiograph 10/10/2015 FINDINGS: The cardiomediastinal contours are unchanged. Streaky opacities in the left mid lower lung zone, present on prior exam but increased. Pulmonary vasculature is normal. No confluent consolidation, pleural effusion, or pneumothorax. No acute osseous abnormalities are seen. IMPRESSION: Streaky opacities in the left mid lower lung zone, present on 2017 exam but increased from. Findings likely represent atelectasis or atypical pneumonia superimposed on scarring. Electronically Signed   By: Keith Rake M.D.   On: 08/10/2019 23:16   CT Angio Chest/Abd/Pel for Dissection W  and/or Wo Contrast  Result Date: 08/11/2019 CLINICAL DATA:  Acute chest and back pain, aortic dissection suspected. Patient reports generalized weakness. EXAM: CT ANGIOGRAPHY CHEST, ABDOMEN AND PELVIS TECHNIQUE: Multidetector CT imaging through the chest, abdomen and pelvis was performed using the standard protocol during bolus administration of intravenous contrast. Multiplanar reconstructed images and MIPs were obtained and reviewed to evaluate the vascular anatomy. CONTRAST:  60mL OMNIPAQUE IOHEXOL 350 MG/ML SOLN COMPARISON:  Radiograph  yesterday. FINDINGS: CTA CHEST FINDINGS Cardiovascular: Aortic atherosclerosis and tortuosity. No aortic dissection, hematoma, or evidence of acute aortic syndrome. Left vertebral artery arises directly from the thoracic aorta, variant arch anatomy. Mild multi chamber cardiomegaly. There are coronary artery calcifications, possible coronary stents. Mitral annulus calcifications. Limited assessment for pulmonary embolus given phase of contrast, no filling defects in the pulmonary arteries to the lobar level. Mediastinum/Nodes: No enlarged mediastinal or hilar lymph nodes. No esophageal wall thickening. No suspicious thyroid nodule. Lungs/Pleura: Linear atelectasis in both lower lobes and the lingula. Mild hypoventilatory atelectasis dependently. Trace pleural thickening without frank pleural effusion. No pulmonary edema or septal thickening. No pulmonary mass. Trachea and mainstem bronchi are patent. Musculoskeletal: There are no acute or suspicious osseous abnormalities. Degenerative change in the thoracic spine. No compression deformity Review of the MIP images confirms the above findings. CTA ABDOMEN AND PELVIS FINDINGS VASCULAR Aorta: Normal in caliber with moderate atherosclerosis. No aneurysm, dissection, or evidence of vasculitis. Celiac: Patent with mild plaque at the origin. No dissection or aneurysm. Branch vessels are patent. SMA: Patent with mild plaque at the origin. Mesenteric branches are patent. Renals: Single right renal artery. Three left renal arteries, 2 codominant and a tiny accessory to the upper pole. All renal arteries are patent with mild atheromatous plaque. No dissection or evidence of FMD. IMA: Patent without evidence of aneurysm, dissection, vasculitis or significant stenosis. Inflow: Patent without evidence of aneurysm, dissection, vasculitis or significant stenosis. Veins: No obvious venous abnormality within the limitations of this arterial phase study. Review of the MIP images  confirms the above findings. NON-VASCULAR Hepatobiliary: 9 mm hypodensity in the left lobe, nonspecific and too small to accurately characterize. Calcified granuloma in the dome of the liver. Distended gallbladder. Possible small layering stones or sludge, series 5, image 80. Mild common bile duct dilatation at 10 mm. No visualized choledocholithiasis. Pancreas: Unremarkable. No pancreatic ductal dilatation or surrounding inflammatory changes. Spleen: Normal in size. Normal arterial enhancement. Adrenals/Urinary Tract: Normal adrenal glands. No hydronephrosis. Trace bilateral symmetric perinephric edema. Urinary bladder is unremarkable. Stomach/Bowel: Decompressed stomach. No small bowel obstruction or inflammatory change. Normal appendix. Moderate volume of colonic stool. No colonic inflammation. Diverticulosis of the distal descending and sigmoid colon. No evidence of diverticulitis. There is sigmoid colonic tortuosity. Lymphatic: No adenopathy. Reproductive: Prostate is unremarkable. Other: No free air, free fluid, or intra-abdominal fluid collection. Musculoskeletal: Multilevel degenerative change throughout the lumbar spine. There are no acute or suspicious osseous abnormalities. Chronic irregularity of the right anterior iliac crest. Chronic calcifications in the anterior left upper thigh musculature, partially included, likely sequela of remote prior injury. Review of the MIP images confirms the above findings. IMPRESSION: 1. Thoracoabdominal aortic atherosclerosis. No aneurysm, dissection, or acute aortic abnormality. Coronary artery calcifications versus stents. 2. Distended gallbladder with possible small layering stones or sludge. Mild common bile duct dilatation at 10 mm, no visualized choledocholithiasis. Recommend correlation with LFTs. Consider further evaluation with right upper quadrant ultrasound. 3. Distal colonic diverticulosis without diverticulitis. 4. Streaky atelectasis in both lower lobes  and lingula.  Aortic Atherosclerosis (ICD10-I70.0). Electronically Signed   By: Keith Rake M.D.   On: 08/11/2019 01:02   US Abdomen Limited RUQ  Result Date: 08/11/2019 CLINICAL DATA:  Gallstones on CT.  Chest pain. EXAM: ULTRASOUND ABDOMEN LIMITED RIGHT UPPER QUADRANT COMPARISON:  None. FINDINGS: Gallbladder: Distended. No gallstones or sludge. No wall thickening. No sonographic Murphy sign noted by sonographer. Common bile duct: Diameter: 3 mm proximally, flares distally to 7 mm. Liver: Hypoechoic lesion in the left lobe of the liver measures 11 x 8 x 9 mm, corresponding to a hypodense lesion on CT, not well characterized sonographically. No other focal lesion. Within normal limits in parenchymal echogenicity. Portal vein is patent on color Doppler imaging with normal direction of blood flow towards the liver. Other: None. IMPRESSION: 1. Distended gallbladder but no gallstones, sludge, or gallbladder wall thickening. 2. Small 11 mm hypoechoic lesion in the left lobe of the liver, not well characterized sonographically but likely cyst. Electronically Signed   By: Keith Rake M.D.   On: 08/11/2019 06:34    Time Spent in minutes 25   Novalynn Branaman M.D on 08/15/2019 at 1:18 PM  Between 7am to 7pm - Pager - [phone]  After 7pm go to [email or web address] - password [facility]  [facility] -  Office  [phone]

## 2019-08-16 DIAGNOSIS — J181 Lobar pneumonia, unspecified organism: Secondary | ICD-10-CM | POA: Diagnosis present

## 2019-08-16 DIAGNOSIS — R6510 Systemic inflammatory response syndrome (SIRS) of non-infectious origin without acute organ dysfunction: Secondary | ICD-10-CM | POA: Diagnosis present

## 2019-08-16 LAB — CULTURE, BLOOD (ROUTINE X 2)
Culture: NO GROWTH
Culture: NO GROWTH
Special Requests: ADEQUATE
Special Requests: ADEQUATE

## 2019-08-16 NOTE — TOC Transition Note (Deleted)
Transition of Care [facility]) - CM/SW Discharge Note   Patient Details  Name: Myka Linzmeier. MRN: [id number] Date of Birth: July 02, 1941  [facility]) CM/SW Contact:  Truitt Merle, LCSW Phone Number: 08/16/2019, 3:21 PM   Clinical Narrative:       Final next level of care: Skilled Nursing Facility Barriers to Discharge: No Barriers Identified   Patient Goals and CMS Choice Patient states their goals for this hospitalization and ongoing recovery are:: "I"m not going to [place] health care; I'm going to [employer].gov Compare Post Acute Care list provided to:: Patient Choice offered to / list presented to : Patient  Discharge Placement              Patient chooses bed at: [facility] Patient to be transferred to facility by: [facility] Name of family member notified: None-Patient refused stating he has already contacted his family. Patient and family notified of of transfer: 08/16/19  Discharge Plan and Services     Post Acute Care Choice: Home Health                               Social Determinants of Health (SDOH) Interventions     Readmission Risk Interventions No flowsheet data found.

## 2019-08-16 NOTE — Progress Notes (Signed)
EMS present for pt discharge; discharge packet given to EMS personnel to take to [facility]; pt discharged via stretcher by EMS to [facility]

## 2019-08-16 NOTE — Discharge Summary (Signed)
Physician Discharge Summary  Rudi Coco. [id number] DOB: Sep 25, 1940 DOA: 08/10/2019  PCP: [facility]  Admit date: 08/10/2019 Discharge date: 08/16/2019  Admitted From: Assisted living Disposition: Skilled nursing facility  Recommendations for Outpatient Follow-up:  Follow-up with MD at SNF in 1 week.  Patient will complete 7-day course of antibiotic after 12/15)   Equipment/Devices: As per therapy at the facility  Discharge Condition: Fair CODE STATUS: Full code Diet recommendation: Heart Healthy    Discharge Diagnoses:  Principal Problem:   Lobar pneumonia ([place])  Active Problems:   Generalized weakness   Acute respiratory failure with hypoxia (HCC)   Coronary artery disease   Hyperlipidemia   Paroxysmal atrial fibrillation (HCC)   BPH without obstruction/lower urinary tract symptoms   Fall   Liver lesion   Acute renal failure superimposed on stage 3a chronic kidney disease (HCC) Essential hypertension   Pressure injury of skin   SIRS (systemic inflammatory response syndrome) ([place])  Brief narrative/HPI  78 year old male resident of group home with history of hypertension, hyperlipidemia, paroxysmal A. fib not on anticoagulation, CAD, BPH, chronic kidney disease stage III, chronic low back pain presented with fall and weakness for several days.  In the ED was found to have oxygen desaturation to 87% on room air.  Blood work showed WC of 14.5K, acute kidney injury, elevated blood pressure of 237/200 mmHg.  Chest x-ray concerning for left-sided infiltrate.  Admitted for further management.  Hospital course  Principal Problem:  Acute respiratory failure with hypoxia ([place]) SIRS ([place]) Secondary to left lobar pneumonia.  Empiric Rocephin and doxycycline, discharge on oral doxycycline to complete 7-day course. COVID-19 tested negative x 2. blood cultures negative for growth.    Symptoms much improved.  Patient maintaining sats on room  air.   Active Problems: Generalized weakness with fall/rhabdomyolysis Patient refused head CT and CT of the cervical spine.  Likely combination of pneumonia, dehydration and low back pain (chronic).  PT recommends SNF.  CPK elevated to 600, statin held, resumed at a lower dose upon discharge.  Acute kidney injury superimposed on stage IIIa chronic kidney disease (HCC) Prerenal with dehydration.  Improved with fluids.  Lasix and lisinopril resumed.  BPH Continue Flomax and Proscar.  Paroxysmal A. fib Not on anticoagulation.  Rate controlled on metoprolol, amiodarone and Cardizem  Hypoechoic left lower lobe lesion/distended gallbladder No gallstones or sludge.  LFTs normal.  AFP negative.    Essential hypertension Continue metoprolol and lisinopril.  Stage II bilateral buttock wound Wound care consult appreciated.  Care per nursing.  Chronic low back pain Continue oxycodone as needed.     Family Communication  : Son and daughter updated on the phone  Disposition Plan  : SNF   Consults  : None  Procedures  : CT angiogram of the chest, ultrasound abdomen    Discharge Instructions   Allergies as of 08/16/2019      Reactions   Aspirin       Medication List    TAKE these medications   acetaminophen 325 MG tablet Commonly known as: TYLENOL Take 650 mg by mouth 4 (four) times daily. Take as needed for fever   amiodarone 200 MG tablet Commonly known as: PACERONE Take 1 tablet (200 mg total) by mouth at bedtime.   Calcium 600+D 600-400 MG-UNIT tablet Generic drug: Calcium Carbonate-Vitamin D Take 1 tablet by mouth daily. Reported on 03/23/2016   Calmoseptine 0.44-20.6 % Oint Generic drug: Menthol-Zinc Oxide Apply topically daily. To buttocks  cetirizine 10 MG tablet Commonly known as: ZYRTEC Take 10 mg by mouth daily.   cholecalciferol 1000 units tablet Commonly known as: VITAMIN D Take 2,000 Units by mouth daily.   clopidogrel 75 MG  tablet Commonly known as: PLAVIX Take 75 mg by mouth daily.   Dermacloud Crea Apply topically 2 (two) times daily. To buttocks and sacral area   diltiazem 240 MG 24 hr capsule Commonly known as: DILACOR XR Take 240 mg by mouth daily.   doxycycline 100 MG tablet Commonly known as: VIBRA-TABS Take 1 tablet (100 mg total) by mouth every 12 (twelve) hours for 3 days.   finasteride 5 MG tablet Commonly known as: PROSCAR Take 5 mg by mouth daily.   furosemide 20 MG tablet Commonly known as: LASIX Take 20 mg by mouth daily.   gabapentin 600 MG tablet Commonly known as: NEURONTIN Take 1,200 mg by mouth 3 (three) times daily.   guaiFENesin 100 MG/5ML liquid Commonly known as: ROBITUSSIN Take 200 mg by mouth every 6 (six) hours as needed for cough.   Lidocaine 4 % Ptch Apply 1 patch topically daily. Right shoulder   lisinopril 10 MG tablet Commonly known as: ZESTRIL Take 10 mg by mouth daily. Hold if SBP<110   loperamide 2 MG capsule Commonly known as: IMODIUM Take 2 mg by mouth as needed for diarrhea or loose stools.   Melatonin 3 MG Tabs Take 1 tablet (3 mg total) by mouth at bedtime.   metoprolol succinate 25 MG 24 hr tablet Commonly known as: TOPROL-XL Take 12.5 mg by mouth 2 (two) times daily.   Minerin Creme Crea Apply 1 application topically daily. To both legs from knees to ankles   Mintox [id number] MG/5ML suspension Generic drug: alum & mag hydroxide-simeth Take 30 mLs by mouth every 6 (six) hours as needed for indigestion or heartburn.   neomycin-bacitracin-polymyxin [phone] ointment Apply 1 application topically daily as needed (tears or abrasions).   nitroGLYCERIN 0.4 MG SL tablet Commonly known as: NITROSTAT Place 0.4 mg under the tongue every 5 (five) minutes as needed for chest pain.   nystatin powder Commonly known as: MYCOSTATIN/NYSTOP Apply topically 4 (four) times daily as needed (rash).   ondansetron 4 MG tablet Commonly known as:  ZOFRAN Take 4 mg by mouth every 6 (six) hours as needed for nausea or vomiting.   Oxycodone HCl 20 MG Tabs Take 0.5 tablets (10 mg total) by mouth 3 (three) times daily.   polyethylene glycol 17 g packet Commonly known as: MIRALAX / GLYCOLAX Take 17 g by mouth daily as needed for mild constipation.   senna 8.6 MG tablet Commonly known as: SENOKOT Take 2 tablets by mouth 2 (two) times daily.   simvastatin 20 MG tablet Commonly known as: ZOCOR Take 0.5 tablets (10 mg total) by mouth at bedtime. What changed: how much to take   tamsulosin 0.4 MG Caps capsule Commonly known as: FLOMAX Take 0.4 mg by mouth daily.   triamcinolone ointment 0.1 % Commonly known as: KENALOG Apply 1 application topically 2 (two) times daily. Feet and ankles   triamcinolone cream 0.1 % Commonly known as: KENALOG Apply 1 application topically every evening. To feet, lower legs and toenails   vitamin B-12 1000 MCG tablet Commonly known as: CYANOCOBALAMIN Take 1,000 mcg by mouth daily.       Contact information for follow-up providers    MD at SNF in 1 week Follow up.            Contact  information for after-discharge care    [facility] .   Service: Skilled Nursing Contact information: Malcolm Turrell [phone]                 Allergies  Allergen Reactions  . Aspirin      Procedures/Studies: DG Chest Port 1 View  Result Date: 08/12/2019 CLINICAL DATA:  Shortness of breath. Heart disease. Atrial fibrillation. Ex-smoker. EXAM: PORTABLE CHEST 1 VIEW COMPARISON:  08/10/2019 FINDINGS: Midline trachea. Borderline cardiomegaly. Atherosclerosis in the transverse aorta. No pleural effusion or pneumothorax. Development of relatively diffuse interstitial prominence with perihilar airspace and interstitial opacities. IMPRESSION: Interstitial and airspace disease bilaterally. Favor pulmonary edema. Especially  given lack of pleural fluid, atypical infection is a secondary consideration. Electronically Signed   By: Abigail Miyamoto M.D.   On: 08/12/2019 15:49   DG Chest Portable 1 View  Result Date: 08/10/2019 CLINICAL DATA:  The chest pain. Progressive weakness. EXAM: PORTABLE CHEST 1 VIEW COMPARISON:  Chest radiograph 10/10/2015 FINDINGS: The cardiomediastinal contours are unchanged. Streaky opacities in the left mid lower lung zone, present on prior exam but increased. Pulmonary vasculature is normal. No confluent consolidation, pleural effusion, or pneumothorax. No acute osseous abnormalities are seen. IMPRESSION: Streaky opacities in the left mid lower lung zone, present on 2017 exam but increased from. Findings likely represent atelectasis or atypical pneumonia superimposed on scarring. Electronically Signed   By: Keith Rake M.D.   On: 08/10/2019 23:16   CT Angio Chest/Abd/Pel for Dissection W and/or Wo Contrast  Result Date: 08/11/2019 CLINICAL DATA:  Acute chest and back pain, aortic dissection suspected. Patient reports generalized weakness. EXAM: CT ANGIOGRAPHY CHEST, ABDOMEN AND PELVIS TECHNIQUE: Multidetector CT imaging through the chest, abdomen and pelvis was performed using the standard protocol during bolus administration of intravenous contrast. Multiplanar reconstructed images and MIPs were obtained and reviewed to evaluate the vascular anatomy. CONTRAST:  64mL OMNIPAQUE IOHEXOL 350 MG/ML SOLN COMPARISON:  Radiograph yesterday. FINDINGS: CTA CHEST FINDINGS Cardiovascular: Aortic atherosclerosis and tortuosity. No aortic dissection, hematoma, or evidence of acute aortic syndrome. Left vertebral artery arises directly from the thoracic aorta, variant arch anatomy. Mild multi chamber cardiomegaly. There are coronary artery calcifications, possible coronary stents. Mitral annulus calcifications. Limited assessment for pulmonary embolus given phase of contrast, no filling defects in the pulmonary  arteries to the lobar level. Mediastinum/Nodes: No enlarged mediastinal or hilar lymph nodes. No esophageal wall thickening. No suspicious thyroid nodule. Lungs/Pleura: Linear atelectasis in both lower lobes and the lingula. Mild hypoventilatory atelectasis dependently. Trace pleural thickening without frank pleural effusion. No pulmonary edema or septal thickening. No pulmonary mass. Trachea and mainstem bronchi are patent. Musculoskeletal: There are no acute or suspicious osseous abnormalities. Degenerative change in the thoracic spine. No compression deformity Review of the MIP images confirms the above findings. CTA ABDOMEN AND PELVIS FINDINGS VASCULAR Aorta: Normal in caliber with moderate atherosclerosis. No aneurysm, dissection, or evidence of vasculitis. Celiac: Patent with mild plaque at the origin. No dissection or aneurysm. Branch vessels are patent. SMA: Patent with mild plaque at the origin. Mesenteric branches are patent. Renals: Single right renal artery. Three left renal arteries, 2 codominant and a tiny accessory to the upper pole. All renal arteries are patent with mild atheromatous plaque. No dissection or evidence of FMD. IMA: Patent without evidence of aneurysm, dissection, vasculitis or significant stenosis. Inflow: Patent without evidence of aneurysm, dissection, vasculitis or significant stenosis. Veins: No obvious venous abnormality  within the limitations of this arterial phase study. Review of the MIP images confirms the above findings. NON-VASCULAR Hepatobiliary: 9 mm hypodensity in the left lobe, nonspecific and too small to accurately characterize. Calcified granuloma in the dome of the liver. Distended gallbladder. Possible small layering stones or sludge, series 5, image 80. Mild common bile duct dilatation at 10 mm. No visualized choledocholithiasis. Pancreas: Unremarkable. No pancreatic ductal dilatation or surrounding inflammatory changes. Spleen: Normal in size. Normal arterial  enhancement. Adrenals/Urinary Tract: Normal adrenal glands. No hydronephrosis. Trace bilateral symmetric perinephric edema. Urinary bladder is unremarkable. Stomach/Bowel: Decompressed stomach. No small bowel obstruction or inflammatory change. Normal appendix. Moderate volume of colonic stool. No colonic inflammation. Diverticulosis of the distal descending and sigmoid colon. No evidence of diverticulitis. There is sigmoid colonic tortuosity. Lymphatic: No adenopathy. Reproductive: Prostate is unremarkable. Other: No free air, free fluid, or intra-abdominal fluid collection. Musculoskeletal: Multilevel degenerative change throughout the lumbar spine. There are no acute or suspicious osseous abnormalities. Chronic irregularity of the right anterior iliac crest. Chronic calcifications in the anterior left upper thigh musculature, partially included, likely sequela of remote prior injury. Review of the MIP images confirms the above findings. IMPRESSION: 1. Thoracoabdominal aortic atherosclerosis. No aneurysm, dissection, or acute aortic abnormality. Coronary artery calcifications versus stents. 2. Distended gallbladder with possible small layering stones or sludge. Mild common bile duct dilatation at 10 mm, no visualized choledocholithiasis. Recommend correlation with LFTs. Consider further evaluation with right upper quadrant ultrasound. 3. Distal colonic diverticulosis without diverticulitis. 4. Streaky atelectasis in both lower lobes and lingula. Aortic Atherosclerosis (ICD10-I70.0). Electronically Signed   By: Keith Rake M.D.   On: 08/11/2019 01:02   US Abdomen Limited RUQ  Result Date: 08/11/2019 CLINICAL DATA:  Gallstones on CT.  Chest pain. EXAM: ULTRASOUND ABDOMEN LIMITED RIGHT UPPER QUADRANT COMPARISON:  None. FINDINGS: Gallbladder: Distended. No gallstones or sludge. No wall thickening. No sonographic Murphy sign noted by sonographer. Common bile duct: Diameter: 3 mm proximally, flares distally to  7 mm. Liver: Hypoechoic lesion in the left lobe of the liver measures 11 x 8 x 9 mm, corresponding to a hypodense lesion on CT, not well characterized sonographically. No other focal lesion. Within normal limits in parenchymal echogenicity. Portal vein is patent on color Doppler imaging with normal direction of blood flow towards the liver. Other: None. IMPRESSION: 1. Distended gallbladder but no gallstones, sludge, or gallbladder wall thickening. 2. Small 11 mm hypoechoic lesion in the left lobe of the liver, not well characterized sonographically but likely cyst. Electronically Signed   By: Keith Rake M.D.   On: 08/11/2019 06:34     Subjective: Reports feeling much better.  Back pain controlled on pain meds  Discharge Exam: Vitals:   08/16/19 0503 08/16/19 0742  BP: (!) 142/56 137/61  Pulse: (!) 49 (!) 55  Resp: 18 18  Temp: 98.2 F (36.8 C) 98.3 F (36.8 C)  SpO2: 94% 95%   Vitals:   08/15/19 1947 08/15/19 2324 08/16/19 0503 08/16/19 0742  BP: (!) 148/77 (!) 147/60 (!) 142/56 137/61  Pulse: (!) 59 (!) 52 (!) 49 (!) 55  Resp: 16  18 18   Temp: 98.9 F (37.2 C)  98.2 F (36.8 C) 98.3 F (36.8 C)  TempSrc: Oral  Oral Oral  SpO2: 94%  94% 95%  Weight:      Height:        General: Elderly male not in distress HEENT: Moist mucosa, supple neck Chest: Clear bilaterally CVs: Normal S1-S2, no murmurs  GI: Soft, nondistended, nontender Musculoskeletal: Warm, no edema     The results of significant diagnostics from this hospitalization (including imaging, microbiology, ancillary and laboratory) are listed below for reference.     Microbiology: Recent Results (from the past 240 hour(s))  SARS CORONAVIRUS 2 (TAT 6-24 HRS) Nasopharyngeal Nasopharyngeal Swab     Status: None   Collection Time: 08/11/19  4:58 AM   Specimen: Nasopharyngeal Swab  Result Value Ref Range Status   SARS Coronavirus 2 NEGATIVE NEGATIVE Final    Comment: (NOTE) SARS-CoV-2 target nucleic acids are  NOT DETECTED. The SARS-CoV-2 RNA is generally detectable in upper and lower respiratory specimens during the acute phase of infection. Negative results do not preclude SARS-CoV-2 infection, do not rule out co-infections with other pathogens, and should not be used as the sole basis for treatment or other patient management decisions. Negative results must be combined with clinical observations, patient history, and epidemiological information. The expected result is Negative. Fact Sheet for Patients: SugarRoll.be Fact Sheet for Healthcare Providers: https://www.woods-mathews.com/ This test is not yet approved or cleared by the [place] FDA and  has been authorized for detection and/or diagnosis of SARS-CoV-2 by FDA under an Emergency Use Authorization (EUA). This EUA will remain  in effect (meaning this test can be used) for the duration of the COVID-19 declaration under Section 56 4(b)(1) of the Act, 21 U.S.C. section 360bbb-3(b)(1), unless the authorization is terminated or revoked sooner. Performed at [facility], [place] [address], [place], [place] [address]   CULTURE, BLOOD (ROUTINE X 2) w Reflex to ID Panel     Status: None   Collection Time: 08/11/19 10:36 AM   Specimen: BLOOD RIGHT ARM  Result Value Ref Range Status   Specimen Description BLOOD RIGHT ARM  Final   Special Requests   Final    BOTTLES DRAWN AEROBIC AND ANAEROBIC Blood Culture adequate volume   Culture   Final    NO GROWTH 5 DAYS Performed at [facility], [address] [place], [place] [address]    Report Status 08/16/2019 FINAL  Final  CULTURE, BLOOD (ROUTINE X 2) w Reflex to ID Panel     Status: None   Collection Time: 08/11/19 10:36 AM   Specimen: BLOOD LEFT ARM  Result Value Ref Range Status   Specimen Description BLOOD LEFT ARM  Final   Special Requests   Final    BOTTLES DRAWN AEROBIC AND ANAEROBIC Blood Culture adequate volume   Culture    Final    NO GROWTH 5 DAYS Performed at [facility], [address], [place], [place] [address]    Report Status 08/16/2019 FINAL  Final  SARS CORONAVIRUS 2 (TAT 6-24 HRS) Nasopharyngeal Nasopharyngeal Swab     Status: None   Collection Time: 08/14/19  6:06 PM   Specimen: Nasopharyngeal Swab  Result Value Ref Range Status   SARS Coronavirus 2 NEGATIVE NEGATIVE Final    Comment: (NOTE) SARS-CoV-2 target nucleic acids are NOT DETECTED. The SARS-CoV-2 RNA is generally detectable in upper and lower respiratory specimens during the acute phase of infection. Negative results do not preclude SARS-CoV-2 infection, do not rule out co-infections with other pathogens, and should not be used as the sole basis for treatment or other patient management decisions. Negative results must be combined with clinical observations, patient history, and epidemiological information. The expected result is Negative. Fact Sheet for Patients: SugarRoll.be Fact Sheet for Healthcare Providers: https://www.woods-mathews.com/ This test is not yet approved or cleared  by the [place] and  has been authorized for detection and/or diagnosis of SARS-CoV-2 by FDA under an Emergency Use Authorization (EUA). This EUA will remain  in effect (meaning this test can be used) for the duration of the COVID-19 declaration under Section 56 4(b)(1) of the Act, 21 U.S.C. section 360bbb-3(b)(1), unless the authorization is terminated or revoked sooner. Performed at [facility], [place] [address] [place], [place] [address]      Labs: BNP (last 3 results) Recent Labs    08/11/19 1209  BNP [id number]*   Basic Metabolic Panel: Recent Labs  Lab 08/10/19 2158 08/12/19 0329 08/13/19 0647  NA 140 144 144  K 3.7 3.8 3.9  CL 106 108 105  CO2 26 24 27   GLUCOSE 113* 115* 103*  BUN 37* 20 23  CREATININE 1.71* 1.09 1.02  CALCIUM 9.3 8.7* 9.0   Liver Function  Tests: Recent Labs  Lab 08/10/19 2158  AST 34  ALT 16  ALKPHOS 45  BILITOT 0.8  PROT 7.7  ALBUMIN 3.9   No results for input(s): LIPASE, AMYLASE in the last 168 hours. No results for input(s): AMMONIA in the last 168 hours. CBC: Recent Labs  Lab 08/10/19 2158 08/12/19 0329  WBC 14.5* 11.8*  HGB 12.1* 11.7*  HCT 36.9* 33.7*  MCV 96.1 93.4  PLT 217 192   Cardiac Enzymes: Recent Labs  Lab 08/11/19 0437 08/13/19 0647 08/15/19 0803  CKTOTAL 555* 692* 177   BNP: Invalid input(s): POCBNP CBG: No results for input(s): GLUCAP in the last 168 hours. D-Dimer No results for input(s): DDIMER in the last 72 hours. Hgb A1c No results for input(s): HGBA1C in the last 72 hours. Lipid Profile No results for input(s): CHOL, HDL, LDLCALC, TRIG, CHOLHDL, LDLDIRECT in the last 72 hours. Thyroid function studies No results for input(s): TSH, T4TOTAL, T3FREE, THYROIDAB in the last 72 hours.  Invalid input(s): FREET3 Anemia work up No results for input(s): VITAMINB12, FOLATE, FERRITIN, TIBC, IRON, RETICCTPCT in the last 72 hours. Urinalysis    Component Value Date/Time   COLORURINE YELLOW (A) 08/11/2019 0413   APPEARANCEUR CLEAR (A) 08/11/2019 0413   APPEARANCEUR Clear 08/29/2013 0454   LABSPEC 1.031 (H) 08/11/2019 0413   LABSPEC 1.019 08/29/2013 0454   PHURINE 5.0 08/11/2019 0413   GLUCOSEU NEGATIVE 08/11/2019 0413   GLUCOSEU Negative 08/29/2013 0454   HGBUR SMALL (A) 08/11/2019 0413   BILIRUBINUR NEGATIVE 08/11/2019 0413   BILIRUBINUR Negative 08/29/2013 0454   KETONESUR NEGATIVE 08/11/2019 0413   PROTEINUR NEGATIVE 08/11/2019 0413   NITRITE NEGATIVE 08/11/2019 0413   LEUKOCYTESUR NEGATIVE 08/11/2019 0413   LEUKOCYTESUR Negative 08/29/2013 0454   Sepsis Labs Invalid input(s): PROCALCITONIN,  WBC,  LACTICIDVEN Microbiology Recent Results (from the past 240 hour(s))  SARS CORONAVIRUS 2 (TAT 6-24 HRS) Nasopharyngeal Nasopharyngeal Swab     Status: None   Collection  Time: 08/11/19  4:58 AM   Specimen: Nasopharyngeal Swab  Result Value Ref Range Status   SARS Coronavirus 2 NEGATIVE NEGATIVE Final    Comment: (NOTE) SARS-CoV-2 target nucleic acids are NOT DETECTED. The SARS-CoV-2 RNA is generally detectable in upper and lower respiratory specimens during the acute phase of infection. Negative results do not preclude SARS-CoV-2 infection, do not rule out co-infections with other pathogens, and should not be used as the sole basis for treatment or other patient management decisions. Negative results must be combined with clinical observations, patient history, and epidemiological information. The expected result is Negative. Fact Sheet for Patients:  SugarRoll.be Fact Sheet for Healthcare Providers: https://www.woods-mathews.com/ This test is not yet approved or cleared by the [place] FDA and  has been authorized for detection and/or diagnosis of SARS-CoV-2 by FDA under an Emergency Use Authorization (EUA). This EUA will remain  in effect (meaning this test can be used) for the duration of the COVID-19 declaration under Section 56 4(b)(1) of the Act, 21 U.S.C. section 360bbb-3(b)(1), unless the authorization is terminated or revoked sooner. Performed at [facility], [place] [address], [place], [place] [address]   CULTURE, BLOOD (ROUTINE X 2) w Reflex to ID Panel     Status: None   Collection Time: 08/11/19 10:36 AM   Specimen: BLOOD RIGHT ARM  Result Value Ref Range Status   Specimen Description BLOOD RIGHT ARM  Final   Special Requests   Final    BOTTLES DRAWN AEROBIC AND ANAEROBIC Blood Culture adequate volume   Culture   Final    NO GROWTH 5 DAYS Performed at [facility], [address] [place], [place] [address]    Report Status 08/16/2019 FINAL  Final  CULTURE, BLOOD (ROUTINE X 2) w Reflex to ID Panel     Status: None   Collection Time: 08/11/19 10:36 AM   Specimen: BLOOD  LEFT ARM  Result Value Ref Range Status   Specimen Description BLOOD LEFT ARM  Final   Special Requests   Final    BOTTLES DRAWN AEROBIC AND ANAEROBIC Blood Culture adequate volume   Culture   Final    NO GROWTH 5 DAYS Performed at [facility], [address], [place], [place] [address]    Report Status 08/16/2019 FINAL  Final  SARS CORONAVIRUS 2 (TAT 6-24 HRS) Nasopharyngeal Nasopharyngeal Swab     Status: None   Collection Time: 08/14/19  6:06 PM   Specimen: Nasopharyngeal Swab  Result Value Ref Range Status   SARS Coronavirus 2 NEGATIVE NEGATIVE Final    Comment: (NOTE) SARS-CoV-2 target nucleic acids are NOT DETECTED. The SARS-CoV-2 RNA is generally detectable in upper and lower respiratory specimens during the acute phase of infection. Negative results do not preclude SARS-CoV-2 infection, do not rule out co-infections with other pathogens, and should not be used as the sole basis for treatment or other patient management decisions. Negative results must be combined with clinical observations, patient history, and epidemiological information. The expected result is Negative. Fact Sheet for Patients: SugarRoll.be Fact Sheet for Healthcare Providers: https://www.woods-mathews.com/ This test is not yet approved or cleared by the [place] FDA and  has been authorized for detection and/or diagnosis of SARS-CoV-2 by FDA under an Emergency Use Authorization (EUA). This EUA will remain  in effect (meaning this test can be used) for the duration of the COVID-19 declaration under Section 56 4(b)(1) of the Act, 21 U.S.C. section 360bbb-3(b)(1), unless the authorization is terminated or revoked sooner. Performed at [facility], [place] [address], [place], [place] [address]      Time coordinating discharge: 35 minutes  SIGNED:   Louellen Molder, MD  [facility] 08/16/2019, 11:15 AM Pager   If 7PM-7AM,  please contact night-coverage [email or web address] Password [email or web address]

## 2019-08-16 NOTE — Progress Notes (Signed)
911 called for nonemergency transport from Room 116 to [place]; discharge pending arrival of EMS for nonemergency transport

## 2019-08-16 NOTE — TOC Progression Note (Signed)
Transition of Care (TOC) - Progression Note    Patient Details  Name: Reza Grzesik. MRN: [id number] Date of Birth: 26-Dec-1940  Transition of Care [facility]) CM/SW Contact  Truitt Merle, LCSW Phone Number: 08/16/2019, 3:22 PM  Clinical Narrative:    Patient ready for discharge today to [facility]). Patient aware and agreeable to discharge plan, per discussion in room. Does not need for LCSW to contact family, as he has already updated them. LCSW confirmed bed available and air mattress delivered with Claiborne Billings in admissions at [facility]. Updated Dr. Clementeen Graham and RN Bailey Mech. RN Bailey Mech to call report to [phone] for room 11B. Patient to be transported to [facility] by [facility]. Patient transportation packet prepared and placed on chart. LCSW signing off, as no further needs.    Expected Discharge Plan: Assisted Living(with home health services.) Barriers to Discharge: No Barriers Identified  Expected Discharge Plan and Services Expected Discharge Plan: Assisted Living(with home health services.)     Post Acute Care Choice: [place] arrangements for the past 2 months: [place] Expected Discharge Date: 08/16/19                                     Social Determinants of Health (SDOH) Interventions    Readmission Risk Interventions No flowsheet data found.

## 2019-08-16 NOTE — Progress Notes (Signed)
Telephone call to [facility] [phone] spoke to [facility] for Room 11b, advised her pt on his way by EMS

## 2019-08-16 NOTE — Progress Notes (Signed)
MD order received in [facility] to discharge pt to SNF today; [facility] team previously established discharge packet for the pt; telephone report called to [facility] at [phone] spoke with Luz Brazen; pt's discharge pending arrival of EMS for nonemergency transport

## 2019-09-01 ENCOUNTER — Other Ambulatory Visit: Payer: Self-pay

## 2019-09-01 ENCOUNTER — Emergency Department: Payer: Medicare Other

## 2019-09-01 ENCOUNTER — Emergency Department
Admission: EM | Admit: 2019-09-01 | Discharge: 2019-09-02 | Disposition: A | Discharge status: Home / Self Care | Payer: Medicare Other | Source: Home / Self Care | Attending: Student in an Organized Health Care Education/Training Program | Admitting: Student in an Organized Health Care Education/Training Program

## 2019-09-01 DIAGNOSIS — U071 COVID-19: Secondary | ICD-10-CM | POA: Insufficient documentation

## 2019-09-01 DIAGNOSIS — Z79899 Other long term (current) drug therapy: Secondary | ICD-10-CM | POA: Insufficient documentation

## 2019-09-01 DIAGNOSIS — I2510 Atherosclerotic heart disease of native coronary artery without angina pectoris: Secondary | ICD-10-CM | POA: Insufficient documentation

## 2019-09-01 DIAGNOSIS — R251 Tremor, unspecified: Secondary | ICD-10-CM

## 2019-09-01 DIAGNOSIS — Z7902 Long term (current) use of antithrombotics/antiplatelets: Secondary | ICD-10-CM | POA: Insufficient documentation

## 2019-09-01 DIAGNOSIS — R531 Weakness: Secondary | ICD-10-CM

## 2019-09-01 DIAGNOSIS — I10 Essential (primary) hypertension: Secondary | ICD-10-CM | POA: Insufficient documentation

## 2019-09-01 DIAGNOSIS — Z96642 Presence of left artificial hip joint: Secondary | ICD-10-CM | POA: Insufficient documentation

## 2019-09-01 DIAGNOSIS — A4189 Other specified sepsis: Secondary | ICD-10-CM | POA: Diagnosis not present

## 2019-09-01 DIAGNOSIS — Z87891 Personal history of nicotine dependence: Secondary | ICD-10-CM | POA: Insufficient documentation

## 2019-09-01 LAB — COMPREHENSIVE METABOLIC PANEL
ALT: 31 U/L (ref 0–44)
AST: 29 U/L (ref 15–41)
Albumin: 3.2 g/dL — ABNORMAL LOW (ref 3.5–5.0)
Alkaline Phosphatase: 94 U/L (ref 38–126)
Anion gap: 12 (ref 5–15)
BUN: 20 mg/dL (ref 8–23)
CO2: 29 mmol/L (ref 22–32)
Calcium: 9.2 mg/dL (ref 8.9–10.3)
Chloride: 96 mmol/L — ABNORMAL LOW (ref 98–111)
Creatinine, Ser: 1.44 mg/dL — ABNORMAL HIGH (ref 0.61–1.24)
GFR calc Af Amer: 53 mL/min — ABNORMAL LOW (ref >60)
GFR calc non Af Amer: 46 mL/min — ABNORMAL LOW (ref >60)
Glucose, Bld: 103 mg/dL — ABNORMAL HIGH (ref 70–99)
Potassium: 3.6 mmol/L (ref 3.5–5.1)
Sodium: 137 mmol/L (ref 135–145)
Total Bilirubin: 1.1 mg/dL (ref 0.3–1.2)
Total Protein: 7.7 g/dL (ref 6.5–8.1)

## 2019-09-01 LAB — URINALYSIS, COMPLETE (UACMP) WITH MICROSCOPIC
Bacteria, UA: NONE SEEN
Bilirubin Urine: NEGATIVE
Glucose, UA: NEGATIVE mg/dL
Ketones, ur: NEGATIVE mg/dL
Leukocytes,Ua: NEGATIVE
Nitrite: NEGATIVE
Protein, ur: NEGATIVE mg/dL
Specific Gravity, Urine: 1.011 (ref 1.005–1.030)
pH: 7 (ref 5.0–8.0)

## 2019-09-01 LAB — CBC WITH DIFFERENTIAL/PLATELET
Abs Immature Granulocytes: 0.02 10*3/uL (ref 0.00–0.07)
Basophils Absolute: 0 10*3/uL (ref 0.0–0.1)
Basophils Relative: 1 %
Eosinophils Absolute: 0.2 10*3/uL (ref 0.0–0.5)
Eosinophils Relative: 2 %
HCT: 41.9 % (ref 39.0–52.0)
Hemoglobin: 13.8 g/dL (ref 13.0–17.0)
Immature Granulocytes: 0 %
Lymphocytes Relative: 15 %
Lymphs Abs: 0.9 10*3/uL (ref 0.7–4.0)
MCH: 31.5 pg (ref 26.0–34.0)
MCHC: 32.9 g/dL (ref 30.0–36.0)
MCV: 95.7 fL (ref 80.0–100.0)
Monocytes Absolute: 1 10*3/uL (ref 0.1–1.0)
Monocytes Relative: 15 %
Neutro Abs: 4.3 10*3/uL (ref 1.7–7.7)
Neutrophils Relative %: 67 %
Platelets: 231 10*3/uL (ref 150–400)
RBC: 4.38 MIL/uL (ref 4.22–5.81)
RDW: 12.8 % (ref 11.5–15.5)
WBC: 6.4 10*3/uL (ref 4.0–10.5)
nRBC: 0 % (ref 0.0–0.2)

## 2019-09-01 LAB — LACTIC ACID, PLASMA: Lactic Acid, Venous: 1.1 mmol/L (ref 0.5–1.9)

## 2019-09-01 LAB — CK: Total CK: 205 U/L (ref 49–397)

## 2019-09-01 LAB — PROCALCITONIN: Procalcitonin: <0.1 ng/mL

## 2019-09-01 LAB — POC SARS CORONAVIRUS 2 AG: SARS Coronavirus 2 Ag: NEGATIVE

## 2019-09-01 MED ORDER — ONDANSETRON HCL 4 MG PO TABS: 4.0000 mg | ORAL_TABLET | Freq: Four times a day (QID) | ORAL | Status: DC | PRN

## 2019-09-01 MED ORDER — FUROSEMIDE 40 MG PO TABS
20.0000 mg | ORAL_TABLET | Freq: Every day | ORAL | Status: DC
  Administered 2019-09-01 – 2019-09-02 (×2): 20 mg via ORAL
  Filled 2019-09-01 (×2): qty 1

## 2019-09-01 MED ORDER — LOPERAMIDE HCL 2 MG PO CAPS: 2.0000 mg | ORAL_CAPSULE | Freq: Every day | ORAL | Status: DC | PRN

## 2019-09-01 MED ORDER — LISINOPRIL 10 MG PO TABS
10.0000 mg | ORAL_TABLET | Freq: Every day | ORAL | Status: DC
  Administered 2019-09-01 – 2019-09-02 (×2): 10 mg via ORAL
  Filled 2019-09-01 (×2): qty 1

## 2019-09-01 MED ORDER — DILTIAZEM HCL ER COATED BEADS 240 MG PO CP24
240.0000 mg | ORAL_CAPSULE | Freq: Every day | ORAL | Status: DC
  Administered 2019-09-02: 10:00:00 240 mg via ORAL
  Filled 2019-09-01 (×2): qty 1

## 2019-09-01 MED ORDER — CLOPIDOGREL BISULFATE 75 MG PO TABS
75.0000 mg | ORAL_TABLET | Freq: Every day | ORAL | Status: DC
  Administered 2019-09-01 – 2019-09-02 (×2): 75 mg via ORAL
  Filled 2019-09-01 (×2): qty 1

## 2019-09-01 MED ORDER — SODIUM CHLORIDE 0.9 % IV BOLUS
500.0000 mL | Freq: Once | INTRAVENOUS | Status: AC
  Administered 2019-09-01: 21:00:00 500 mL via INTRAVENOUS

## 2019-09-01 MED ORDER — AMIODARONE HCL 200 MG PO TABS
200.0000 mg | ORAL_TABLET | Freq: Every day | ORAL | Status: DC
  Administered 2019-09-01: 200 mg via ORAL
  Filled 2019-09-01 (×2): qty 1

## 2019-09-01 MED ORDER — GABAPENTIN 600 MG PO TABS
300.0000 mg | ORAL_TABLET | Freq: Three times a day (TID) | ORAL | Status: DC
  Administered 2019-09-01 – 2019-09-02 (×3): 300 mg via ORAL
  Filled 2019-09-01 (×3): qty 1

## 2019-09-01 MED ORDER — POLYETHYLENE GLYCOL 3350 17 G PO PACK: 17.0000 g | PACK | Freq: Every day | ORAL | Status: DC | PRN

## 2019-09-01 MED ORDER — OXYCODONE HCL 5 MG PO TABS: 10.0000 mg | ORAL_TABLET | Freq: Three times a day (TID) | ORAL | Status: DC | PRN

## 2019-09-01 MED ORDER — FINASTERIDE 5 MG PO TABS
5.0000 mg | ORAL_TABLET | Freq: Every day | ORAL | Status: DC
  Administered 2019-09-02: 5 mg via ORAL
  Filled 2019-09-01: qty 1

## 2019-09-01 MED ORDER — GABAPENTIN 800 MG PO TABS: 400.0000 mg | ORAL_TABLET | Freq: Three times a day (TID) | ORAL | Status: DC

## 2019-09-01 MED ORDER — IOHEXOL 350 MG/ML SOLN
75.0000 mL | Freq: Once | INTRAVENOUS | Status: AC | PRN
  Administered 2019-09-01: 75 mL via INTRAVENOUS

## 2019-09-01 MED ORDER — METOPROLOL SUCCINATE ER 25 MG PO TB24
12.5000 mg | ORAL_TABLET | Freq: Two times a day (BID) | ORAL | Status: DC
  Administered 2019-09-02: 12.5 mg via ORAL
  Filled 2019-09-01 (×3): qty 0.5

## 2019-09-01 NOTE — ED Notes (Signed)
Pt given turkey sandwich tray and water 

## 2019-09-01 NOTE — ED Triage Notes (Signed)
Pt arrives via [facility] from [facility] with staff reports of tremors x 1 week. Pt recently discharged from [facility] 12/13 for pneumonia, covid tests at the facility have been negative but pt has been rooming with another resident who is covid positive. Pt alert to self, per EMS staff at facility reports early stages of alzheimers. NAD noted. O2 on RA is 89%, placed on 2L [place]

## 2019-09-01 NOTE — ED Notes (Addendum)
Pt's son updated on pt's status. Dr. Quentin Cornwall informed of son's request for social work consult

## 2019-09-01 NOTE — Care Management (Signed)
RN CM: Incoming call from facility advising patient was stable at facility. Family arrived and had some concerns and decided to call EMS. Patient was in independent room and not rooming with a COVID patient.

## 2019-09-01 NOTE — ED Notes (Signed)
Pt's son updated on pt's status and plan of care

## 2019-09-01 NOTE — ED Notes (Signed)
Pt otf for imaging 

## 2019-09-01 NOTE — ED Provider Notes (Addendum)
[facility] Emergency Department Provider Note    First MD Initiated Contact with Patient 09/01/19 1500     (approximate)  I have reviewed the triage vital signs and the nursing notes.   HISTORY  Chief Complaint Tremors    HPI Joel Zein. is a 78 y.o. male the below listed past medical history recent admission to the hospital for lobar pneumonia  discharged to SNF with outpatient antibiotics presents the ER for several days of tremor and shaking spell with worsening cough shortness of breath.  Was reportedly discharged without any O2 requirement.  He presents to the ER today reportedly hypoxic on room air via ems requiring supplemental oxygen.  Low-grade temperature.  Reportedly has a roommate or co-resident that just recently tested positive for Covid.   Past Medical History:  Diagnosis Date  . 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 Coronary artery disease    a. 2003 PCI to [place];  b. 11/2012 Cath: LM nl, LAD 30, LCX nl, RI 40 ISR, RCA nl.  . Hereditary and idiopathic peripheral neuropathy   . Hyperlipidemia   . Hypertension   . Hypertensive heart disease    a. 08/2013 Echo: EF 70-75%, mild LVH.  Marland Kitchen Insomnia   . Paroxysmal atrial fibrillation (HCC)    a.08/2013-->amio;  b. CHA2DS2VASc = 4-->no longer on coumadin.  . Vitamin D deficiency    Family History  Problem Relation Age of Onset  . Diabetes Neg Hx    Past Surgical History:  Procedure Laterality Date  . CARDIAC CATHETERIZATION  11/13/12   [facility]; EF >55%  . CARDIAC CATHETERIZATION  2003  . JOINT REPLACEMENT     left hip  . NECK SURGERY     Patient Active Problem List   Diagnosis Date Noted  . Lobar pneumonia ([place]) 08/16/2019  . SIRS (systemic inflammatory response syndrome) ([place]) 08/16/2019  . Pressure injury of skin 08/12/2019  . Fall 08/11/2019  . Acute respiratory failure with hypoxia ([place]) 08/11/2019  . Generalized weakness 08/11/2019  . Liver lesion  08/11/2019  . Acute renal failure superimposed on stage 3a chronic kidney disease ([place]) 08/11/2019  . HTN (hypertension) 08/11/2019  . Seasonal allergic rhinitis 01/03/2017  . Chronic pain syndrome 05/16/2016  . Chronic lumbar pain 03/23/2016  . Abnormality of gait 12/09/2015  . Insomnia 12/09/2015  . Vitamin D deficiency 11/08/2015  . Peripheral neuropathy 11/08/2015  . BPH without obstruction/lower urinary tract symptoms 11/08/2015  . Hypertensive heart disease   . Paroxysmal atrial fibrillation (HCC)   . Long term (current) use of anticoagulants 09/07/2013  . Atrial fibrillation ([place]) 09/04/2013  . Coronary artery disease   . Hyperlipidemia       Prior to Admission medications   Medication Sig Start Date End Date Taking? Authorizing Provider  acetaminophen (TYLENOL) 325 MG tablet Take 650 mg by mouth every 6 (six) hours as needed for mild pain or fever.    Yes [provider]  alum & mag hydroxide-simeth (Ivor) 200-200-20 MG/5ML suspension Take 30 mLs by mouth every 6 (six) hours as needed for indigestion or heartburn.   Yes [provider]  amiodarone (PACERONE) 200 MG tablet Take 1 tablet (200 mg total) by mouth at bedtime. 09/30/15  Yes Theora Gianotti, NP  Calcium Carbonate-Vitamin D (CALCIUM 600+D) 600-400 MG-UNIT tablet Take 1 tablet by mouth daily.    Yes [provider]  cetirizine (ZYRTEC) 10 MG tablet Take 10 mg by mouth daily.   Yes  [provider]  cholecalciferol (VITAMIN D) 1000 UNITS tablet Take 2,000 Units by mouth daily.    Yes [provider]  clopidogrel (PLAVIX) 75 MG tablet Take 75 mg by mouth daily.   Yes [provider]  diltiazem (DILACOR XR) 240 MG 24 hr capsule Take 240 mg by mouth daily.    Yes [provider]  finasteride (PROSCAR) 5 MG tablet Take 5 mg by mouth daily.    Yes [provider]  furosemide (LASIX) 20 MG tablet Take 20 mg by mouth daily.   Yes [provider]  gabapentin (NEURONTIN) 600 MG tablet Take 1,200 mg by mouth 3 (three) times daily.    Yes [provider]  Infant Care Products (DERMACLOUD) CREA Apply 1 application topically 2 (two) times daily. (apply to buttocks and sacral area)   Yes [provider]  Lidocaine 4 % PTCH Place 1 patch onto the skin daily. (apply to right shoulder)   Yes [provider]  lisinopril (PRINIVIL,ZESTRIL) 10 MG tablet Take 10 mg by mouth daily. (hold if SBP<110)   Yes [provider]  loperamide (IMODIUM) 2 MG capsule Take 2 mg by mouth daily as needed for diarrhea or loose stools.    Yes [provider]  Melatonin 3 MG TABS Take 1 tablet (3 mg total) by mouth at bedtime. 11/08/15  Yes Ngetich, Dinah C, NP  Menthol-Zinc Oxide (CALMOSEPTINE) 0.44-20.6 % OINT Apply 1 application topically daily. (apply to buttocks)   Yes [provider]  metoprolol succinate (TOPROL-XL) 25 MG 24 hr tablet Take 12.5 mg by mouth 2 (two) times daily.   Yes [provider]  neomycin-bacitracin-polymyxin (NEOSPORIN) [phone] ointment Apply 1 application topically daily as needed (tears or abrasions).   Yes [provider]  nitroGLYCERIN (NITROSTAT) 0.4 MG SL tablet Place 0.4 mg under the tongue every 5 (five) minutes as needed for chest pain.   Yes [provider]  nystatin (MYCOSTATIN/NYSTOP) powder Apply 1 application topically 4 (four) times daily.    Yes [provider]  ondansetron (ZOFRAN) 4 MG tablet Take 4 mg by mouth every 6 (six) hours as needed for nausea or vomiting.    Yes [provider]  Oxycodone HCl 20 MG TABS Take 0.5 tablets (10 mg total) by mouth 3 (three) times daily. 08/15/19  Yes Dhungel, Nishant, MD  polyethylene glycol (MIRALAX / GLYCOLAX) 17 g packet Take 17 g by mouth daily as needed for mild constipation.   Yes [provider]  senna (SENOKOT) 8.6 MG tablet Take 2 tablets by mouth daily.    Yes  [provider]  simvastatin (ZOCOR) 10 MG tablet Take 10 mg by mouth at bedtime.   Yes [provider]  Skin Protectants, Misc. (MINERIN CREME) CREA Apply 1 application topically daily. (apply to legs and knees)   Yes [provider]  tamsulosin (FLOMAX) 0.4 MG CAPS Take 0.4 mg by mouth daily.    Yes [provider]  triamcinolone ointment (KENALOG) 0.1 % Apply 1 application topically 2 (two) times daily. (apply to feet, lower legs and ankles)   Yes [provider]  vitamin B-12 (CYANOCOBALAMIN) 1000 MCG tablet Take 1,000 mcg by mouth daily.   Yes [provider]    Allergies Aspirin    Social History Social History   Tobacco Use  . Smoking status: Former Smoker    Packs/day: 0.50    Years: 4.00    Pack years: 2.00  . Smokeless tobacco:  Never Used  Substance Use Topics  . Alcohol use: No  . Drug use: No    Review of Systems Patient denies headaches, rhinorrhea, blurry vision, numbness, shortness of breath, chest pain, edema, cough, abdominal pain, nausea, vomiting, diarrhea, dysuria, fevers, rashes or hallucinations unless otherwise stated above in HPI. ____________________________________________   PHYSICAL EXAM:  VITAL SIGNS: Vitals:   09/01/19 1700 09/01/19 1730  BP: (!) 142/52 131/62  Pulse: 75 (!) 54  Resp: 11 16  Temp:    SpO2: 94% 96%    Constitutional: Alert and oriented. Frail appearing Eyes: Conjunctivae are normal.  Head: Atraumatic. Nose: No congestion/rhinnorhea. Mouth/Throat: Mucous membranes are moist.   Neck: No stridor. Painless ROM.  Cardiovascular: Normal rate, regular rhythm. Grossly normal heart sounds.  Good peripheral circulation. Respiratory: Normal respiratory effort.  No retractions. Lungs with coarse bibasilar breathsounds . Gastrointestinal: Soft and nontender. No distention. No abdominal bruits. No CVA tenderness. Genitourinary:  Musculoskeletal: No lower extremity tenderness nor  edema.  No joint effusions. Neurologic:  Normal speech and language. No gross focal neurologic deficits are appreciated. No facial droop Skin:  Skin is warm, dry and intact. No rash noted. Psychiatric: Mood and affect are normal. Speech and behavior are normal.  ____________________________________________   LABS (all labs ordered are listed, but only abnormal results are displayed)  Results for orders placed or performed during the hospital encounter of 09/01/19 (from the past 24 hour(s))  Lactic acid, plasma     Status: None   Collection Time: 09/01/19  3:19 PM  Result Value Ref Range   Lactic Acid, Venous 1.1 0.5 - 1.9 mmol/L  Comprehensive metabolic panel     Status: Abnormal   Collection Time: 09/01/19  3:19 PM  Result Value Ref Range   Sodium 137 135 - 145 mmol/L   Potassium 3.6 3.5 - 5.1 mmol/L   Chloride 96 (L) 98 - 111 mmol/L   CO2 29 22 - 32 mmol/L   Glucose, Bld 103 (H) 70 - 99 mg/dL   BUN 20 8 - 23 mg/dL   Creatinine, Ser 1.44 (H) 0.61 - 1.24 mg/dL   Calcium 9.2 8.9 - 10.3 mg/dL   Total Protein 7.7 6.5 - 8.1 g/dL   Albumin 3.2 (L) 3.5 - 5.0 g/dL   AST 29 15 - 41 U/L   ALT 31 0 - 44 U/L   Alkaline Phosphatase 94 38 - 126 U/L   Total Bilirubin 1.1 0.3 - 1.2 mg/dL   GFR calc non Af Amer 46 (L) >60 mL/min   GFR calc Af Amer 53 (L) >60 mL/min   Anion gap 12 5 - 15  CBC WITH DIFFERENTIAL     Status: None   Collection Time: 09/01/19  3:19 PM  Result Value Ref Range   WBC 6.4 4.0 - 10.5 K/uL   RBC 4.38 4.22 - 5.81 MIL/uL   Hemoglobin 13.8 13.0 - 17.0 g/dL   HCT 41.9 39.0 - 52.0 %   MCV 95.7 80.0 - 100.0 fL   MCH 31.5 26.0 - 34.0 pg   MCHC 32.9 30.0 - 36.0 g/dL   RDW 12.8 11.5 - 15.5 %   Platelets 231 150 - 400 K/uL   nRBC 0.0 0.0 - 0.2 %   Neutrophils Relative % 67 %   Neutro Abs 4.3 1.7 - 7.7 K/uL   Lymphocytes Relative 15 %   Lymphs Abs 0.9 0.7 - 4.0 K/uL   Monocytes Relative 15 %   Monocytes Absolute 1.0 0.1 - 1.0 K/uL  Eosinophils Relative 2 %    Eosinophils Absolute 0.2 0.0 - 0.5 K/uL   Basophils Relative 1 %   Basophils Absolute 0.0 0.0 - 0.1 K/uL   Immature Granulocytes 0 %   Abs Immature Granulocytes 0.02 0.00 - 0.07 K/uL  Procalcitonin     Status: None   Collection Time: 09/01/19  3:19 PM  Result Value Ref Range   Procalcitonin <0.10 ng/mL  Urinalysis, Complete w Microscopic     Status: Abnormal   Collection Time: 09/01/19  3:19 PM  Result Value Ref Range   Color, Urine YELLOW (A) YELLOW   APPearance CLEAR (A) CLEAR   Specific Gravity, Urine 1.011 1.005 - 1.030   pH 7.0 5.0 - 8.0   Glucose, UA NEGATIVE NEGATIVE mg/dL   Hgb urine dipstick SMALL (A) NEGATIVE   Bilirubin Urine NEGATIVE NEGATIVE   Ketones, ur NEGATIVE NEGATIVE mg/dL   Protein, ur NEGATIVE NEGATIVE mg/dL   Nitrite NEGATIVE NEGATIVE   Leukocytes,Ua NEGATIVE NEGATIVE   RBC / HPF 0-5 0 - 5 RBC/hpf   WBC, UA 0-5 0 - 5 WBC/hpf   Bacteria, UA NONE SEEN NONE SEEN   Squamous Epithelial / LPF 0-5 0 - 5   Mucus PRESENT    Hyaline Casts, UA PRESENT   CK     Status: None   Collection Time: 09/01/19  3:19 PM  Result Value Ref Range   Total CK 205 49 - 397 U/L  POC SARS Coronavirus 2 Ag     Status: None   Collection Time: 09/01/19  4:31 PM  Result Value Ref Range   SARS Coronavirus 2 Ag NEGATIVE NEGATIVE   ____________________________________________  EKG My review and personal interpretation at Time: 15:10   Indication: trmor  Rate: 55  Rhythm: sinus Axis: normal Other: normal intervals, no stemi ____________________________________________  RADIOLOGY  I personally reviewed all radiographic images ordered to evaluate for the above acute complaints and reviewed radiology reports and findings.  These findings were personally discussed with the patient.  Please see medical record for radiology report.  ____________________________________________   PROCEDURES  Procedure(s) performed:  Procedures    Critical Care performed:  no ____________________________________________   INITIAL IMPRESSION / ASSESSMENT AND PLAN / ED COURSE  Pertinent labs & imaging results that were available during my care of the patient were reviewed by me and considered in my medical decision making (see chart for details).   DDX: pna, sepsis, electrolyte abn, covid,, aki, renal failure, uti  Joel Wilson. is a 78 y.o. who presents to the ED with is as described above.  Patient nontoxic-appearing exam reassuring.  Patient's not hypoxic on room air currently will place on cardiac monitor and continue to observe.  Clinical Course as of Aug 31 2033  Tue Sep 01, 2019  1633 Blood work reassuring.  Rapid Covid is negative.  Will order CT imaging for the above differential.  Family notified nurse that they are unwilling to have patient go back to the same nursing facility.  Will consult social work.   [PR]  1757 Patient does not have any hypoxia.  His work-up is reassuring.  Do not feel patient meets criteria for admission at this point.  Will work with social work to see if there is other options for placement as family refusing for him to go back to previous facility.   [PR]    Clinical Course User Index [PR] [name], MD   Discussed work-up and family concerns with patient's daughter and son.  They were concerned about some of their concerns being neglected while he was at [facility] care do not feel comfortable with him being discharged back to that facility which seems reasonable.  He remains hemodynamically stable.  Is not having any significant tremors at this moment.  I suspect some of this may be secondary to his medications will decrease some of them give some IV hydration and reassess.  The patient was evaluated in Emergency Department today for the symptoms described in the history of present illness. He/she was evaluated in the context of the global COVID-19 pandemic, which necessitated consideration that the  patient might be at risk for infection with the SARS-CoV-2 virus that causes COVID-19. Institutional protocols and algorithms that pertain to the evaluation of patients at risk for COVID-19 are in a state of rapid change based on information released by regulatory bodies including the CDC and federal and state organizations. These policies and algorithms were followed during the patient's care in the ED.  As part of my medical decision making, I reviewed the following data within the [facility] notes reviewed and incorporated, Labs reviewed, notes from prior ED visits and [place] Controlled Substance Database   ____________________________________________   FINAL CLINICAL IMPRESSION(S) / ED DIAGNOSES  Final diagnoses:  Shakiness  Weakness      NEW MEDICATIONS STARTED DURING THIS VISIT:  New Prescriptions   No medications on file     Note:  This document was prepared using Dragon voice recognition software and may include unintentional dictation errors.    [name], MD 09/01/19 Marjo Bicker    [name], MD 09/01/19 2034

## 2019-09-01 NOTE — ED Notes (Signed)
Condom cath placed per pt request

## 2019-09-01 NOTE — ED Notes (Signed)
Ok to d/c 2nd lactic per Dr. Quentin Cornwall

## 2019-09-01 NOTE — ED Notes (Signed)
Pt's O2 drops to 89% on RA when sleeping, when awakening pt's O2 saturation is 93% on RA

## 2019-09-01 NOTE — ED Notes (Signed)
Dr Robinson at bedside 

## 2019-09-02 ENCOUNTER — Other Ambulatory Visit: Payer: Self-pay

## 2019-09-02 LAB — SARS CORONAVIRUS 2 (TAT 6-24 HRS): SARS Coronavirus 2: POSITIVE — AB

## 2019-09-02 NOTE — ED Notes (Signed)
Pt repositioned in bed.

## 2019-09-02 NOTE — ED Notes (Signed)
Pt given meal tray.

## 2019-09-02 NOTE — ED Notes (Signed)
Pt's son updated about pt. Pt given phone to talk to son.

## 2019-09-02 NOTE — TOC Initial Note (Addendum)
Transition of Care (TOC) - Initial/Assessment Note    Patient Details  Name: Joel Wilson. MRN: [id number] Date of Birth: 31-Dec-1940  Transition of Care [facility]) CM/SW Contact:    Anselm Pancoast, RN Phone Number: 09/02/2019, 3:21 PM  Clinical Narrative:                 Talked with son on the phone and family is requesting patient return to [facility] where pt resides however patient is COVID (+) and requiring extensive assistance with ADLs. Pt will need to continue with rehab at [facility] until able to return to ALF more independent. [facility] is willing to accept patient back to room 33A.   Expected Discharge Plan: [place]     Patient Goals and CMS Choice Patient states their goals for this hospitalization and ongoing recovery are:: get strong enough to return home to ALF      Expected Discharge Plan and Services Expected Discharge Plan: [place] arrangements for the past 2 months: [place]                                      Prior Living Arrangements/Services Living arrangements for the past 2 months: [place] Lives with:: Self Patient language and need for interpreter reviewed:: Yes Do you feel safe going back to the place where you live?: Yes      Need for Family Participation in Patient Care: Yes (Comment) Care giver support system in place?: Yes (comment)   Criminal Activity/Legal Involvement Pertinent to Current Situation/Hospitalization: No - Comment as needed  Activities of Daily Living      Permission Sought/Granted Permission sought to share information with : Facility Sport and exercise psychologist                Emotional Assessment Appearance:: Appears stated age Attitude/Demeanor/Rapport: Engaged Affect (typically observed): Accepting Orientation: : Oriented to Self, Oriented to Place, Oriented to  Time, Oriented to  Situation   Psych Involvement: No (comment)  Admission diagnosis:  tremors Patient Active Problem List   Diagnosis Date Noted  . Lobar pneumonia ([place]) 08/16/2019  . SIRS (systemic inflammatory response syndrome) ([place]) 08/16/2019  . Pressure injury of skin 08/12/2019  . Fall 08/11/2019  . Acute respiratory failure with hypoxia ([place]) 08/11/2019  . Generalized weakness 08/11/2019  . Liver lesion 08/11/2019  . Acute renal failure superimposed on stage 3a chronic kidney disease ([place]) 08/11/2019  . HTN (hypertension) 08/11/2019  . Seasonal allergic rhinitis 01/03/2017  . Chronic pain syndrome 05/16/2016  . Chronic lumbar pain 03/23/2016  . Abnormality of gait 12/09/2015  . Insomnia 12/09/2015  . Vitamin D deficiency 11/08/2015  . Peripheral neuropathy 11/08/2015  . BPH without obstruction/lower urinary tract symptoms 11/08/2015  . Hypertensive heart disease   . Paroxysmal atrial fibrillation (HCC)   . Long term (current) use of anticoagulants 09/07/2013  . Atrial fibrillation ([place]) 09/04/2013  . Coronary artery disease   . Hyperlipidemia    PCP:  [facility]:   [place] [place]. [place] [place] [address] Phone: [phone] Fax: [phone]     Social Determinants of Health (SDOH) Interventions    Readmission Risk Interventions No flowsheet data found.

## 2019-09-02 NOTE — ED Notes (Addendum)
Voicemail left for social work for a second time. Waiting on response due to social work consult that has not been completed. Charge nurse Colletta Maryland made aware.

## 2019-09-02 NOTE — ED Notes (Addendum)
Pt informed he will be going to [facility].

## 2019-09-02 NOTE — ED Notes (Signed)
Report called to [facility]. 

## 2019-09-02 NOTE — ED Notes (Signed)
Pt placed in clean brief at this time.

## 2019-09-02 NOTE — ED Notes (Signed)
Pt's son called this RN regarding pt's status in the ER. Son states that pt told him he was now able to ambulate and would be able to use his wheelchair. Son states that it would be fine for patient to return to facility.

## 2019-09-02 NOTE — ED Notes (Signed)
Pt given breakfast tray

## 2019-09-02 NOTE — ED Notes (Signed)
Pt satting at 95% on room air.

## 2019-09-02 NOTE — ED Provider Notes (Signed)
Patient remains hemodynamically stable.  Is Covid positive but not hypoxic and no indication for hospitalization at this time.  [facility] has accepted patient back to care facility and this is the only option for placement at the level of care that he requires.  Family and patient agreeable to this at this time.  I have also made referral to the outpatient infusion center given his comorbidities I think he could be a candidate.   [name], MD 09/02/19 [phone]

## 2019-09-02 NOTE — TOC Transition Note (Signed)
Transition of Care [facility]) - CM/SW Discharge Note   Patient Details  Name: Joel Wilson. MRN: [id number] Date of Birth: 1940-09-07  Transition of Care [facility]) CM/SW Contact:  Anselm Pancoast, RN Phone Number: 09/02/2019, 4:05 PM   Clinical Narrative:    Patient returning to [facility] SNF. Son  notified of transfer and transportation via EMS. Updated SNF patient returning to SNF.          Patient Goals and CMS Choice Patient states their goals for this hospitalization and ongoing recovery are:: get strong enough to return home to ALF      Discharge Placement                       Discharge Plan and Services                                     Social Determinants of Health (SDOH) Interventions     Readmission Risk Interventions No flowsheet data found.

## 2019-09-02 NOTE — ED Notes (Signed)
Spoke with Case Freight forwarder and she is working on [employer] of care for [employer].

## 2019-09-02 NOTE — ED Notes (Signed)
Pt's sister Iris updated on pt status.

## 2019-09-02 NOTE — ED Notes (Signed)
This RN contacted social working regarding status for patient placement. Voicemail was left to call this rn back with any updates. Pt's son aware.

## 2019-09-02 NOTE — ED Provider Notes (Signed)
-----------------------------------------   6:53 AM on 09/02/2019 -----------------------------------------   Blood pressure 135/65, pulse (!) 55, temperature 99.2 F (37.3 C), temperature source Oral, resp. rate 15, height 5\' 9"  (1.753 m), weight 80.7 kg, SpO2 92 %.  The patient is sleeping at this time.  Lab called to inform us of his positive Covid results..  Awaiting disposition plan from Social Work team(s).   Paulette Blanch, MD 09/02/19 [phone]

## 2019-09-03 ENCOUNTER — Inpatient Hospital Stay
Admission: EM | Admit: 2019-09-03 | Discharge: 2019-09-08 | DRG: 871 | Disposition: A | Discharge status: Home / Self Care | Payer: Medicare Other | Attending: Internal Medicine | Admitting: Internal Medicine

## 2019-09-03 ENCOUNTER — Telehealth: Payer: Self-pay | Admitting: Unknown Physician Specialty

## 2019-09-03 DIAGNOSIS — E785 Hyperlipidemia, unspecified: Secondary | ICD-10-CM | POA: Diagnosis present

## 2019-09-03 DIAGNOSIS — Z96642 Presence of left artificial hip joint: Secondary | ICD-10-CM | POA: Diagnosis present

## 2019-09-03 DIAGNOSIS — N400 Enlarged prostate without lower urinary tract symptoms: Secondary | ICD-10-CM | POA: Diagnosis present

## 2019-09-03 DIAGNOSIS — G609 Hereditary and idiopathic neuropathy, unspecified: Secondary | ICD-10-CM | POA: Diagnosis present

## 2019-09-03 DIAGNOSIS — I1310 Hypertensive heart and chronic kidney disease without heart failure, with stage 1 through stage 4 chronic kidney disease, or unspecified chronic kidney disease: Secondary | ICD-10-CM | POA: Diagnosis present

## 2019-09-03 DIAGNOSIS — J9601 Acute respiratory failure with hypoxia: Secondary | ICD-10-CM | POA: Diagnosis present

## 2019-09-03 DIAGNOSIS — Z7902 Long term (current) use of antithrombotics/antiplatelets: Secondary | ICD-10-CM

## 2019-09-03 DIAGNOSIS — N1831 Chronic kidney disease, stage 3a: Secondary | ICD-10-CM | POA: Diagnosis present

## 2019-09-03 DIAGNOSIS — E559 Vitamin D deficiency, unspecified: Secondary | ICD-10-CM | POA: Diagnosis present

## 2019-09-03 DIAGNOSIS — I2510 Atherosclerotic heart disease of native coronary artery without angina pectoris: Secondary | ICD-10-CM | POA: Diagnosis present

## 2019-09-03 DIAGNOSIS — Z87448 Personal history of other diseases of urinary system: Secondary | ICD-10-CM

## 2019-09-03 DIAGNOSIS — Z8701 Personal history of pneumonia (recurrent): Secondary | ICD-10-CM

## 2019-09-03 DIAGNOSIS — Z79899 Other long term (current) drug therapy: Secondary | ICD-10-CM

## 2019-09-03 DIAGNOSIS — I480 Paroxysmal atrial fibrillation: Secondary | ICD-10-CM | POA: Diagnosis present

## 2019-09-03 DIAGNOSIS — N179 Acute kidney failure, unspecified: Secondary | ICD-10-CM | POA: Diagnosis present

## 2019-09-03 DIAGNOSIS — U071 COVID-19: Secondary | ICD-10-CM | POA: Diagnosis present

## 2019-09-03 DIAGNOSIS — R6521 Severe sepsis with septic shock: Secondary | ICD-10-CM | POA: Diagnosis present

## 2019-09-03 DIAGNOSIS — A4189 Other specified sepsis: Principal | ICD-10-CM | POA: Diagnosis present

## 2019-09-03 DIAGNOSIS — L89610 Pressure ulcer of right heel, unstageable: Secondary | ICD-10-CM | POA: Diagnosis present

## 2019-09-03 DIAGNOSIS — Z886 Allergy status to analgesic agent status: Secondary | ICD-10-CM

## 2019-09-03 DIAGNOSIS — M549 Dorsalgia, unspecified: Secondary | ICD-10-CM | POA: Diagnosis present

## 2019-09-03 DIAGNOSIS — Z87891 Personal history of nicotine dependence: Secondary | ICD-10-CM

## 2019-09-03 DIAGNOSIS — J1282 Pneumonia due to coronavirus disease 2019: Secondary | ICD-10-CM | POA: Diagnosis present

## 2019-09-03 NOTE — Telephone Encounter (Signed)
Discussed with patient's children about Covid symptoms and the use of bamlanivimab, a monoclonal antibody infusion for those with mild to moderate Covid symptoms and at a high risk of hospitalization.  Pt is qualified for this infusion at the [facility] infusion center due to Age > 57 and multiple co-morbid conditions.    Sx onset 12/27.  Talked to son and daughter.  Transportation will be an issue and son will explore options.

## 2019-09-04 ENCOUNTER — Other Ambulatory Visit: Payer: Self-pay

## 2019-09-04 ENCOUNTER — Emergency Department: Payer: Medicare Other

## 2019-09-04 ENCOUNTER — Encounter: Payer: Self-pay | Admitting: Pulmonary Disease

## 2019-09-04 DIAGNOSIS — G609 Hereditary and idiopathic neuropathy, unspecified: Secondary | ICD-10-CM | POA: Diagnosis present

## 2019-09-04 DIAGNOSIS — Z8701 Personal history of pneumonia (recurrent): Secondary | ICD-10-CM | POA: Diagnosis not present

## 2019-09-04 DIAGNOSIS — L89610 Pressure ulcer of right heel, unstageable: Secondary | ICD-10-CM | POA: Diagnosis present

## 2019-09-04 DIAGNOSIS — Z96642 Presence of left artificial hip joint: Secondary | ICD-10-CM | POA: Diagnosis present

## 2019-09-04 DIAGNOSIS — N400 Enlarged prostate without lower urinary tract symptoms: Secondary | ICD-10-CM | POA: Diagnosis present

## 2019-09-04 DIAGNOSIS — U071 COVID-19: Secondary | ICD-10-CM | POA: Diagnosis present

## 2019-09-04 DIAGNOSIS — M549 Dorsalgia, unspecified: Secondary | ICD-10-CM | POA: Diagnosis present

## 2019-09-04 DIAGNOSIS — I2510 Atherosclerotic heart disease of native coronary artery without angina pectoris: Secondary | ICD-10-CM | POA: Diagnosis present

## 2019-09-04 DIAGNOSIS — E559 Vitamin D deficiency, unspecified: Secondary | ICD-10-CM | POA: Diagnosis present

## 2019-09-04 DIAGNOSIS — J9601 Acute respiratory failure with hypoxia: Secondary | ICD-10-CM | POA: Diagnosis present

## 2019-09-04 DIAGNOSIS — R6521 Severe sepsis with septic shock: Secondary | ICD-10-CM | POA: Diagnosis present

## 2019-09-04 DIAGNOSIS — Z87891 Personal history of nicotine dependence: Secondary | ICD-10-CM | POA: Diagnosis not present

## 2019-09-04 DIAGNOSIS — J1282 Pneumonia due to coronavirus disease 2019: Secondary | ICD-10-CM | POA: Diagnosis present

## 2019-09-04 DIAGNOSIS — I480 Paroxysmal atrial fibrillation: Secondary | ICD-10-CM | POA: Diagnosis present

## 2019-09-04 DIAGNOSIS — Z886 Allergy status to analgesic agent status: Secondary | ICD-10-CM | POA: Diagnosis not present

## 2019-09-04 DIAGNOSIS — E785 Hyperlipidemia, unspecified: Secondary | ICD-10-CM | POA: Diagnosis present

## 2019-09-04 DIAGNOSIS — I1310 Hypertensive heart and chronic kidney disease without heart failure, with stage 1 through stage 4 chronic kidney disease, or unspecified chronic kidney disease: Secondary | ICD-10-CM | POA: Diagnosis present

## 2019-09-04 DIAGNOSIS — Z87448 Personal history of other diseases of urinary system: Secondary | ICD-10-CM | POA: Diagnosis not present

## 2019-09-04 DIAGNOSIS — Z7902 Long term (current) use of antithrombotics/antiplatelets: Secondary | ICD-10-CM | POA: Diagnosis not present

## 2019-09-04 DIAGNOSIS — A4189 Other specified sepsis: Secondary | ICD-10-CM | POA: Diagnosis present

## 2019-09-04 DIAGNOSIS — N1831 Chronic kidney disease, stage 3a: Secondary | ICD-10-CM | POA: Diagnosis present

## 2019-09-04 DIAGNOSIS — Z79899 Other long term (current) drug therapy: Secondary | ICD-10-CM | POA: Diagnosis not present

## 2019-09-04 DIAGNOSIS — N179 Acute kidney failure, unspecified: Secondary | ICD-10-CM | POA: Diagnosis present

## 2019-09-04 LAB — LACTATE DEHYDROGENASE: LDH: 222 U/L — ABNORMAL HIGH (ref 98–192)

## 2019-09-04 LAB — CBC WITH DIFFERENTIAL/PLATELET
Abs Immature Granulocytes: 0.04 10*3/uL (ref 0.00–0.07)
Basophils Absolute: 0 10*3/uL (ref 0.0–0.1)
Basophils Relative: 0 %
Eosinophils Absolute: 0 10*3/uL (ref 0.0–0.5)
Eosinophils Relative: 0 %
HCT: 40.7 % (ref 39.0–52.0)
Hemoglobin: 13.8 g/dL (ref 13.0–17.0)
Immature Granulocytes: 1 %
Lymphocytes Relative: 12 %
Lymphs Abs: 1.1 10*3/uL (ref 0.7–4.0)
MCH: 31.4 pg (ref 26.0–34.0)
MCHC: 33.9 g/dL (ref 30.0–36.0)
MCV: 92.5 fL (ref 80.0–100.0)
Monocytes Absolute: 0.9 10*3/uL (ref 0.1–1.0)
Monocytes Relative: 10 %
Neutro Abs: 6.7 10*3/uL (ref 1.7–7.7)
Neutrophils Relative %: 77 %
Platelets: 221 10*3/uL (ref 150–400)
RBC: 4.4 MIL/uL (ref 4.22–5.81)
RDW: 13 % (ref 11.5–15.5)
WBC: 8.7 10*3/uL (ref 4.0–10.5)
nRBC: 0 % (ref 0.0–0.2)

## 2019-09-04 LAB — COMPREHENSIVE METABOLIC PANEL
ALT: 62 U/L — ABNORMAL HIGH (ref 0–44)
AST: 58 U/L — ABNORMAL HIGH (ref 15–41)
Albumin: 3.1 g/dL — ABNORMAL LOW (ref 3.5–5.0)
Alkaline Phosphatase: 75 U/L (ref 38–126)
Anion gap: 14 (ref 5–15)
BUN: 36 mg/dL — ABNORMAL HIGH (ref 8–23)
CO2: 27 mmol/L (ref 22–32)
Calcium: 8.6 mg/dL — ABNORMAL LOW (ref 8.9–10.3)
Chloride: 97 mmol/L — ABNORMAL LOW (ref 98–111)
Creatinine, Ser: 2.71 mg/dL — ABNORMAL HIGH (ref 0.61–1.24)
GFR calc Af Amer: 25 mL/min — ABNORMAL LOW (ref >60)
GFR calc non Af Amer: 21 mL/min — ABNORMAL LOW (ref >60)
Glucose, Bld: 114 mg/dL — ABNORMAL HIGH (ref 70–99)
Potassium: 3.6 mmol/L (ref 3.5–5.1)
Sodium: 138 mmol/L (ref 135–145)
Total Bilirubin: 1 mg/dL (ref 0.3–1.2)
Total Protein: 7.3 g/dL (ref 6.5–8.1)

## 2019-09-04 LAB — LACTIC ACID, PLASMA: Lactic Acid, Venous: 1.6 mmol/L (ref 0.5–1.9)

## 2019-09-04 LAB — GLUCOSE, CAPILLARY: Glucose-Capillary: 156 mg/dL — ABNORMAL HIGH (ref 70–99)

## 2019-09-04 LAB — FIBRIN DERIVATIVES D-DIMER (ARMC ONLY): Fibrin derivatives D-dimer (ARMC): 1090.12 ng/mL (FEU) — ABNORMAL HIGH (ref 0.00–499.00)

## 2019-09-04 LAB — PROCALCITONIN: Procalcitonin: 0.21 ng/mL

## 2019-09-04 LAB — BRAIN NATRIURETIC PEPTIDE: B Natriuretic Peptide: 62 pg/mL (ref 0.0–100.0)

## 2019-09-04 LAB — TRIGLYCERIDES: Triglycerides: 107 mg/dL (ref <150)

## 2019-09-04 LAB — FIBRINOGEN: Fibrinogen: 591 mg/dL — ABNORMAL HIGH (ref 210–475)

## 2019-09-04 LAB — FERRITIN: Ferritin: 198 ng/mL (ref 24–336)

## 2019-09-04 LAB — MRSA PCR SCREENING: MRSA by PCR: POSITIVE — AB

## 2019-09-04 LAB — C-REACTIVE PROTEIN: CRP: 4.5 mg/dL — ABNORMAL HIGH (ref <1.0)

## 2019-09-04 MED ORDER — AMIODARONE HCL 200 MG PO TABS
200.0000 mg | ORAL_TABLET | Freq: Every day | ORAL | Status: DC
  Administered 2019-09-04 – 2019-09-07 (×4): 200 mg via ORAL
  Filled 2019-09-04 (×4): qty 1

## 2019-09-04 MED ORDER — CHLORHEXIDINE GLUCONATE CLOTH 2 % EX PADS
6.0000 | MEDICATED_PAD | Freq: Every day | CUTANEOUS | Status: DC
  Administered 2019-09-04 – 2019-09-05 (×2): 6 via TOPICAL

## 2019-09-04 MED ORDER — CHLORHEXIDINE GLUCONATE 0.12 % MT SOLN
15.0000 mL | Freq: Two times a day (BID) | OROMUCOSAL | Status: DC
  Administered 2019-09-04 – 2019-09-08 (×9): 15 mL via OROMUCOSAL
  Filled 2019-09-04 (×9): qty 15

## 2019-09-04 MED ORDER — HEPARIN SODIUM (PORCINE) 5000 UNIT/ML IJ SOLN: 5000.0000 [IU] | Freq: Three times a day (TID) | INTRAMUSCULAR | Status: DC

## 2019-09-04 MED ORDER — HEPARIN SODIUM (PORCINE) 5000 UNIT/ML IJ SOLN: 7500.0000 [IU] | Freq: Three times a day (TID) | INTRAMUSCULAR | Status: DC

## 2019-09-04 MED ORDER — PANTOPRAZOLE SODIUM 40 MG IV SOLR
40.0000 mg | Freq: Every day | INTRAVENOUS | Status: DC
  Administered 2019-09-04 – 2019-09-07 (×4): 40 mg via INTRAVENOUS
  Filled 2019-09-04 (×4): qty 40

## 2019-09-04 MED ORDER — HEPARIN SODIUM (PORCINE) 5000 UNIT/ML IJ SOLN
INTRAMUSCULAR | Status: AC
  Filled 2019-09-04: qty 2

## 2019-09-04 MED ORDER — DEXAMETHASONE SODIUM PHOSPHATE 10 MG/ML IJ SOLN
10.0000 mg | Freq: Once | INTRAMUSCULAR | Status: AC
  Administered 2019-09-04: 10 mg via INTRAVENOUS
  Filled 2019-09-04: qty 1

## 2019-09-04 MED ORDER — GABAPENTIN 600 MG PO TABS: 1200.0000 mg | ORAL_TABLET | Freq: Three times a day (TID) | ORAL | Status: DC

## 2019-09-04 MED ORDER — GABAPENTIN 100 MG PO CAPS
100.0000 mg | ORAL_CAPSULE | Freq: Three times a day (TID) | ORAL | Status: DC
  Administered 2019-09-04 – 2019-09-08 (×13): 100 mg via ORAL
  Filled 2019-09-04 (×14): qty 1

## 2019-09-04 MED ORDER — ASCORBIC ACID 500 MG PO TABS
500.0000 mg | ORAL_TABLET | Freq: Every day | ORAL | Status: DC
  Administered 2019-09-04 – 2019-09-08 (×5): 500 mg via ORAL
  Filled 2019-09-04 (×5): qty 1

## 2019-09-04 MED ORDER — DEXAMETHASONE SODIUM PHOSPHATE 10 MG/ML IJ SOLN
6.0000 mg | INTRAMUSCULAR | Status: DC
  Administered 2019-09-05 – 2019-09-08 (×4): 6 mg via INTRAVENOUS
  Filled 2019-09-04 (×6): qty 0.6

## 2019-09-04 MED ORDER — ONDANSETRON HCL 4 MG/2ML IJ SOLN: 4.0000 mg | Freq: Four times a day (QID) | INTRAMUSCULAR | Status: DC | PRN

## 2019-09-04 MED ORDER — HEPARIN SODIUM (PORCINE) 5000 UNIT/ML IJ SOLN
5000.0000 [IU] | Freq: Three times a day (TID) | INTRAMUSCULAR | Status: DC
  Administered 2019-09-04 – 2019-09-08 (×13): 5000 [IU] via SUBCUTANEOUS
  Filled 2019-09-04 (×12): qty 1

## 2019-09-04 MED ORDER — ZINC SULFATE 220 (50 ZN) MG PO CAPS
220.0000 mg | ORAL_CAPSULE | Freq: Every day | ORAL | Status: DC
  Administered 2019-09-04 – 2019-09-08 (×5): 220 mg via ORAL
  Filled 2019-09-04 (×6): qty 1

## 2019-09-04 MED ORDER — CLOPIDOGREL BISULFATE 75 MG PO TABS
75.0000 mg | ORAL_TABLET | Freq: Every day | ORAL | Status: DC
  Administered 2019-09-04 – 2019-09-08 (×5): 75 mg via ORAL
  Filled 2019-09-04 (×5): qty 1

## 2019-09-04 MED ORDER — ALUM & MAG HYDROXIDE-SIMETH 200-200-20 MG/5ML PO SUSP: 30.0000 mL | Freq: Four times a day (QID) | ORAL | Status: DC | PRN

## 2019-09-04 MED ORDER — SODIUM CHLORIDE 0.9 % IV BOLUS
1000.0000 mL | Freq: Once | INTRAVENOUS | Status: AC
  Administered 2019-09-04: 1000 mL via INTRAVENOUS

## 2019-09-04 MED ORDER — ORAL CARE MOUTH RINSE
15.0000 mL | Freq: Two times a day (BID) | OROMUCOSAL | Status: DC
  Administered 2019-09-04 – 2019-09-06 (×5): 15 mL via OROMUCOSAL

## 2019-09-04 MED ORDER — NOREPINEPHRINE 4 MG/250ML-% IV SOLN
0.0000 ug/min | INTRAVENOUS | Status: DC
  Administered 2019-09-04 (×2): 2 ug/min via INTRAVENOUS
  Filled 2019-09-04 (×2): qty 250

## 2019-09-04 MED ORDER — ONDANSETRON HCL 4 MG/2ML IJ SOLN
4.0000 mg | Freq: Four times a day (QID) | INTRAMUSCULAR | Status: DC | PRN
  Administered 2019-09-04 – 2019-09-06 (×2): 4 mg via INTRAVENOUS
  Filled 2019-09-04 (×2): qty 2

## 2019-09-04 MED ORDER — SENNA 8.6 MG PO TABS
2.0000 | ORAL_TABLET | Freq: Every day | ORAL | Status: DC
  Administered 2019-09-04 – 2019-09-08 (×5): 17.2 mg via ORAL
  Filled 2019-09-04 (×5): qty 2

## 2019-09-04 MED ORDER — MUPIROCIN 2 % EX OINT
1.0000 "application " | TOPICAL_OINTMENT | Freq: Two times a day (BID) | CUTANEOUS | Status: DC
  Administered 2019-09-04 – 2019-09-08 (×9): 1 via NASAL
  Filled 2019-09-04: qty 22

## 2019-09-04 MED ORDER — DEXAMETHASONE SODIUM PHOSPHATE 10 MG/ML IJ SOLN
INTRAMUSCULAR | Status: AC
  Administered 2019-09-04: 6 mg via INTRAVENOUS
  Filled 2019-09-04: qty 1

## 2019-09-04 MED ORDER — ACETAMINOPHEN 325 MG PO TABS: 650.0000 mg | ORAL_TABLET | ORAL | Status: DC | PRN

## 2019-09-04 MED ORDER — LORATADINE 10 MG PO TABS
10.0000 mg | ORAL_TABLET | Freq: Every day | ORAL | Status: DC
  Administered 2019-09-04 – 2019-09-08 (×5): 10 mg via ORAL
  Filled 2019-09-04 (×5): qty 1

## 2019-09-04 MED ORDER — SODIUM CHLORIDE 0.9 % IV SOLN
100.0000 mg | Freq: Every day | INTRAVENOUS | Status: DC
  Administered 2019-09-05 – 2019-09-07 (×3): 100 mg via INTRAVENOUS
  Filled 2019-09-04: qty 20
  Filled 2019-09-04: qty 100
  Filled 2019-09-04: qty 20
  Filled 2019-09-04: qty 100
  Filled 2019-09-04: qty 20

## 2019-09-04 MED ORDER — POLYETHYLENE GLYCOL 3350 17 G PO PACK
17.0000 g | PACK | Freq: Every day | ORAL | Status: DC | PRN
  Administered 2019-09-04: 17 g via ORAL
  Filled 2019-09-04: qty 1

## 2019-09-04 MED ORDER — SIMVASTATIN 10 MG PO TABS
10.0000 mg | ORAL_TABLET | Freq: Every day | ORAL | Status: DC
  Administered 2019-09-04 – 2019-09-07 (×4): 10 mg via ORAL
  Filled 2019-09-04 (×5): qty 1

## 2019-09-04 MED ORDER — SODIUM CHLORIDE 0.9 % IV SOLN
200.0000 mg | Freq: Once | INTRAVENOUS | Status: AC
  Administered 2019-09-04: 200 mg via INTRAVENOUS
  Filled 2019-09-04: qty 40

## 2019-09-04 MED ORDER — TAMSULOSIN HCL 0.4 MG PO CAPS
0.4000 mg | ORAL_CAPSULE | Freq: Every day | ORAL | Status: DC
  Administered 2019-09-04 – 2019-09-08 (×5): 0.4 mg via ORAL
  Filled 2019-09-04 (×5): qty 1

## 2019-09-04 MED ORDER — FINASTERIDE 5 MG PO TABS
5.0000 mg | ORAL_TABLET | Freq: Every day | ORAL | Status: DC
  Administered 2019-09-04 – 2019-09-08 (×5): 5 mg via ORAL
  Filled 2019-09-04 (×6): qty 1

## 2019-09-04 MED ORDER — ADULT MULTIVITAMIN W/MINERALS CH
1.0000 | ORAL_TABLET | Freq: Every day | ORAL | Status: DC
  Administered 2019-09-04 – 2019-09-08 (×5): 1 via ORAL
  Filled 2019-09-04 (×5): qty 1

## 2019-09-04 MED ORDER — LACTATED RINGERS IV BOLUS
1000.0000 mL | Freq: Once | INTRAVENOUS | Status: AC
  Administered 2019-09-04: 1000 mL via INTRAVENOUS

## 2019-09-04 NOTE — ED Notes (Addendum)
Pt's O2 drops to 89% while sleeping; woken and prompted to take deep breaths; O2 inc to 4L. Sats immediately rise to 96%. Attending Mansy notified. Will switch pt to high flow [place] or nonrebreather as needed.

## 2019-09-04 NOTE — ED Notes (Signed)
Admitting informed via secure chat of low BP

## 2019-09-04 NOTE — ED Triage Notes (Addendum)
Pt in via EMS from [facility] d/t declining vitals. Pt is covid positive per facility. Currently satting 96% on 3L via [place]. Pt states not normally on supplemental O2. BP soft per EMS.

## 2019-09-04 NOTE — ED Notes (Signed)
Jessica RN collected 2nd set of cultures at R fa before vein blew.

## 2019-09-04 NOTE — ED Notes (Signed)
Pt's son, Shayaan Hermiller updated with pt's verbal okay.

## 2019-09-04 NOTE — ED Notes (Signed)
Report to Amy, RN

## 2019-09-04 NOTE — ED Notes (Signed)
Taken to Unit by this RN and ED tech. Pt clean and dry upon transfer. Pt taken on 15L NRB as recommended by RT. States that they will get the HFNC setup in room upon patient arrival. Pt tolerating NRB well. All of belongings including cell phone sent with patient to floor.

## 2019-09-04 NOTE — ED Provider Notes (Signed)
[facility] Emergency Department Provider Note  ____________________________________________   First MD Initiated Contact with Patient 09/04/19 0005     (approximate)  I have reviewed the triage vital signs and the nursing notes.   HISTORY  Chief Complaint Shortness of Breath   HPI Joel Wilson. is a 79 y.o. male with below list of previous medical conditions including recently diagnosed COVID-19 infection on 09/02/2019 returns to the emergency department from [facility] healthcare via EMS secondary to worsening dyspnea hypoxia generalized malaise of vomiting and poor p.o. intake.  Patient's current oxygen saturation 96% on 3 L via nasal cannula.        Past Medical History:  Diagnosis Date  . 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 Coronary artery disease    a. 2003 PCI to [place];  b. 11/2012 Cath: LM nl, LAD 30, LCX nl, RI 40 ISR, RCA nl.  . Hereditary and idiopathic peripheral neuropathy   . Hyperlipidemia   . Hypertension   . Hypertensive heart disease    a. 08/2013 Echo: EF 70-75%, mild LVH.  Marland Kitchen Insomnia   . Paroxysmal atrial fibrillation (HCC)    a.08/2013-->amio;  b. CHA2DS2VASc = 4-->no longer on coumadin.  . Vitamin D deficiency     Patient Active Problem List   Diagnosis Date Noted  . Lobar pneumonia ([place]) 08/16/2019  . SIRS (systemic inflammatory response syndrome) ([place]) 08/16/2019  . Pressure injury of skin 08/12/2019  . Fall 08/11/2019  . Acute respiratory failure with hypoxia ([place]) 08/11/2019  . Generalized weakness 08/11/2019  . Liver lesion 08/11/2019  . Acute renal failure superimposed on stage 3a chronic kidney disease ([place]) 08/11/2019  . HTN (hypertension) 08/11/2019  . Seasonal allergic rhinitis 01/03/2017  . Chronic pain syndrome 05/16/2016  . Chronic lumbar pain 03/23/2016  . Abnormality of gait 12/09/2015  . Insomnia 12/09/2015  . Vitamin D deficiency 11/08/2015  . Peripheral neuropathy  11/08/2015  . BPH without obstruction/lower urinary tract symptoms 11/08/2015  . Hypertensive heart disease   . Paroxysmal atrial fibrillation (HCC)   . Long term (current) use of anticoagulants 09/07/2013  . Atrial fibrillation ([place]) 09/04/2013  . Coronary artery disease   . Hyperlipidemia     Past Surgical History:  Procedure Laterality Date  . CARDIAC CATHETERIZATION  11/13/12   [facility]; EF >55%  . CARDIAC CATHETERIZATION  2003  . JOINT REPLACEMENT     left hip  . NECK SURGERY      Prior to Admission medications   Medication Sig Start Date End Date Taking? Authorizing Provider  acetaminophen (TYLENOL) 325 MG tablet Take 650 mg by mouth every 6 (six) hours as needed for mild pain or fever.     [provider]  alum & mag hydroxide-simeth ([place]) 200-200-20 MG/5ML suspension Take 30 mLs by mouth every 6 (six) hours as needed for indigestion or heartburn.    [provider]  amiodarone (PACERONE) 200 MG tablet Take 1 tablet (200 mg total) by mouth at bedtime. 09/30/15   Theora Gianotti, NP  Calcium Carbonate-Vitamin D (CALCIUM 600+D) 600-400 MG-UNIT tablet Take 1 tablet by mouth daily.     [provider]  cetirizine (ZYRTEC) 10 MG tablet Take 10 mg by mouth daily.    [provider]  cholecalciferol (VITAMIN D) 1000 UNITS tablet Take 2,000 Units by mouth daily.     [provider]  clopidogrel (PLAVIX) 75 MG tablet Take 75 mg by mouth daily.    [provider]  diltiazem (DILACOR XR) 240 MG 24 hr capsule Take 240 mg by mouth daily.     [provider]  finasteride (PROSCAR) 5 MG tablet Take 5 mg by mouth daily.     [provider]  furosemide (LASIX) 20 MG tablet Take 20 mg by mouth daily.    [provider]  gabapentin (NEURONTIN) 600 MG tablet Take 1,200 mg by mouth 3 (three) times daily.     [provider]  Infant Care Products (DERMACLOUD) CREA Apply 1 application topically 2 (two)  times daily. (apply to buttocks and sacral area)    [provider]  Lidocaine 4 % PTCH Place 1 patch onto the skin daily. (apply to right shoulder)    [provider]  lisinopril (PRINIVIL,ZESTRIL) 10 MG tablet Take 10 mg by mouth daily. (hold if SBP<110)    [provider]  loperamide (IMODIUM) 2 MG capsule Take 2 mg by mouth daily as needed for diarrhea or loose stools.     [provider]  Melatonin 3 MG TABS Take 1 tablet (3 mg total) by mouth at bedtime. 11/08/15   Ngetich, Dinah C, NP  Menthol-Zinc Oxide (CALMOSEPTINE) 0.44-20.6 % OINT Apply 1 application topically daily. (apply to buttocks)    [provider]  metoprolol succinate (TOPROL-XL) 25 MG 24 hr tablet Take 12.5 mg by mouth 2 (two) times daily.    [provider]  neomycin-bacitracin-polymyxin (NEOSPORIN) [phone] ointment Apply 1 application topically daily as needed (tears or abrasions).    [provider]  nitroGLYCERIN (NITROSTAT) 0.4 MG SL tablet Place 0.4 mg under the tongue every 5 (five) minutes as needed for chest pain.    [provider]  nystatin (MYCOSTATIN/NYSTOP) powder Apply 1 application topically 4 (four) times daily.     [provider]  ondansetron (ZOFRAN) 4 MG tablet Take 4 mg by mouth every 6 (six) hours as needed for nausea or vomiting.     [provider]  Oxycodone HCl 20 MG TABS Take 0.5 tablets (10 mg total) by mouth 3 (three) times daily. 08/15/19   Dhungel, Nishant, MD  polyethylene glycol (MIRALAX / GLYCOLAX) 17 g packet Take 17 g by mouth daily as needed for mild constipation.    [provider]  senna (SENOKOT) 8.6 MG tablet Take 2 tablets by mouth daily.     [provider]  simvastatin (ZOCOR) 10 MG tablet Take 10 mg by mouth at bedtime.    [provider]  Skin Protectants, Misc. (MINERIN CREME) CREA Apply 1 application topically daily. (apply to legs and knees)    [provider]  tamsulosin (FLOMAX) 0.4 MG CAPS Take 0.4 mg by mouth daily.     [provider]  triamcinolone ointment (KENALOG) 0.1 % Apply 1 application topically 2 (two) times daily. (apply to feet, lower legs and ankles)    [provider]  vitamin B-12 (CYANOCOBALAMIN) 1000 MCG tablet Take 1,000 mcg by mouth daily.    [provider]    Allergies Aspirin  Family History  Problem Relation Age of Onset  . Diabetes Neg Hx     Social History Social History   Tobacco Use  . Smoking status: Former Smoker    Packs/day: 0.50    Years: 4.00    Pack years: 2.00  . Smokeless tobacco: Never Used  Substance Use Topics  . Alcohol use: No  . Drug use: No    Review of Systems Constitutional: Positive for  fever/chills Eyes: No visual changes. ENT: No sore throat. Cardiovascular: Denies chest pain. Respiratory: Positive for shortness of breath. Gastrointestinal: No abdominal pain.  No nausea, no vomiting.  No diarrhea.  No constipation. Genitourinary: Negative for dysuria. Musculoskeletal: Positive generalized muscle aches. Integumentary: Negative for rash. Neurological: Negative for headaches, focal weakness or numbness.  ____________________________________________   PHYSICAL EXAM:  VITAL SIGNS: ED Triage Vitals  Enc Vitals Group     BP 09/04/19 0012 (!) 66/47     Pulse Rate 09/04/19 0004 77     Resp 09/04/19 0012 19     Temp --      Temp src --      SpO2 09/04/19 0004 96 %     Weight 09/04/19 0039 81.6 kg (180 lb)     Height 09/04/19 0039 1.829 m (6')     Head Circumference --      Peak Flow --      Pain Score --      Pain Loc --      Pain Edu? --      Excl. in [place]? --     Constitutional: Alert and oriented.  Apparent respiratory difficulty. Eyes: Conjunctivae are normal.  Mouth/Throat: Patient is wearing a mask. Neck: No stridor.  No meningeal signs.   Cardiovascular: Normal rate, regular rhythm. Good peripheral circulation.  Grossly normal heart sounds. Respiratory: Tachypnea no retractions.  Bibasilar rhonchi Gastrointestinal: Soft and nontender. No distention.  Musculoskeletal: No lower extremity tenderness nor edema. No gross deformities of extremities. Neurologic:  Normal speech and language. No gross focal neurologic deficits are appreciated.  Skin:  Skin is warm, dry and intact. Psychiatric: Mood and affect are normal. Speech and behavior are normal.  ____________________________________________   LABS (all labs ordered are listed, but only abnormal results are displayed)  Labs Reviewed  CULTURE, BLOOD (ROUTINE X 2)  CULTURE, BLOOD (ROUTINE X 2)  LACTIC ACID, PLASMA  LACTIC ACID, PLASMA  CBC WITH DIFFERENTIAL/PLATELET  COMPREHENSIVE METABOLIC PANEL  FIBRIN DERIVATIVES D-DIMER ([facility] ONLY)  PROCALCITONIN  LACTATE DEHYDROGENASE  FERRITIN  TRIGLYCERIDES  FIBRINOGEN  C-REACTIVE PROTEIN   ____________________________________________  EKG  ED ECG REPORT I, [place]on Sem, the attending physician, personally viewed and interpreted this ECG.   Date: 09/04/2019  EKG Time: 12:11 AM  Rate: 71  Rhythm: Normal sinus rhythm  Axis: Normal  Intervals: Normal  ST&T Change: None  ____________________________________________  RADIOLOGY I, [place] N Revere Maahs, personally viewed and evaluated these images (plain radiographs) as part of my medical decision making, as well as reviewing the written report by the radiologist.  ED MD interpretation: Mild interstitial congestion without frank alveolar edema on chest x-ray per radiologist.  Official radiology report(s): DG Chest Port 1 View  Result Date: 09/04/2019 CLINICAL DATA:  Initial evaluation for acute respiratory distress. EXAM: PORTABLE CHEST 1 VIEW COMPARISON:  Prior radiograph from 09/01/2019. FINDINGS: Mild cardiomegaly, stable. Mediastinal silhouette within normal limits. Lungs hypoinflated. Mild diffuse pulmonary interstitial congestion without  frank alveolar edema. No pleural effusion. No focal infiltrates or consolidative opacity. No pneumothorax. No acute osseous finding. Degenerative changes noted about the left shoulder. IMPRESSION: 1. Mild diffuse pulmonary interstitial congestion without frank pulmonary edema. 2. No other active cardiopulmonary disease. Electronically Signed   By: Jeannine Boga M.D.   On: 09/04/2019 00:40    ____________________________________________   PROCEDURES     .Critical Care Performed by: Gregor Hams, MD Authorized by: Gregor Hams, MD   Critical care provider statement:  Critical care time (minutes):  45   Critical care time was exclusive of:  Separately billable procedures and treating other patients   Critical care was necessary to treat or prevent imminent or life-threatening deterioration of the following conditions:  Respiratory failure   Critical care was time spent personally by me on the following activities:  Development of treatment plan with patient or surrogate, discussions with consultants, evaluation of patient's response to treatment, examination of patient, obtaining history from patient or surrogate, ordering and performing treatments and interventions, ordering and review of laboratory studies, ordering and review of radiographic studies, pulse oximetry, re-evaluation of patient's condition and review of old charts     ____________________________________________   INITIAL IMPRESSION / MDM / [place] / ED COURSE  As part of my medical decision making, I reviewed the following data within the electronic MEDICAL RECORD NUMBER   78 year old male presented with above-stated history and physical exam with eventual diagnosis consistent with worsening COVID-19 infection with acute respiratory failure with hypoxia.  3 L of oxygen via nasal cannula applied.  Patient given Decadron 10 mg IV.  Ordered from remdesivir.     Patient with progressive dyspnea  and hypoxia with sleep down to 88% and as such patient was placed on high flow oxygen 9 L.  Patient discussed with Dr. Sidney Ace for hospital admission for further evaluation and management of COVID-19 with hypoxia.       ____________________________________________  FINAL CLINICAL IMPRESSION(S) / ED DIAGNOSES  Final diagnoses:  COVID-19 virus infection     MEDICATIONS GIVEN DURING THIS VISIT:  Medications  sodium chloride 0.9 % bolus 1,000 mL (1,000 mLs Intravenous New Bag/Given 09/04/19 0024)  sodium chloride 0.9 % bolus 1,000 mL (1,000 mLs Intravenous New Bag/Given 09/04/19 0035)     ED Discharge Orders    None      *Please note:  Metro Pickett. was evaluated in Emergency Department on 09/04/2019 for the symptoms described in the history of present illness. He was evaluated in the context of the global COVID-19 pandemic, which necessitated consideration that the patient might be at risk for infection with the SARS-CoV-2 virus that causes COVID-19. Institutional protocols and algorithms that pertain to the evaluation of patients at risk for COVID-19 are in a state of rapid change based on information released by regulatory bodies including the CDC and federal and state organizations. These policies and algorithms were followed during the patient's care in the ED.  Some ED evaluations and interventions may be delayed as a result of limited staffing during the pandemic.*  Note:  This document was prepared using Dragon voice recognition software and may include unintentional dictation errors.   Gregor Hams, MD 09/04/19 [phone]

## 2019-09-04 NOTE — H&P (Signed)
Name: Joel Wilson. MRN: [id number] DOB: 12-Nov-1940    ADMISSION DATE:  09/03/2019 CONSULTATION DATE:  09/04/2019  REFERRING MD :  Dr. Owens Shark  CHIEF COMPLAINT:  Shortness of breath  BRIEF PATIENT DESCRIPTION:  79 year old male admitted 1/1 with acute hypoxic respiratory failure and septic shock requiring vasopressors in the setting of COVID-19 infection.  SIGNIFICANT EVENTS  1/1>>Admission to ICU  STUDIES:  CTA chest 12/29>>1. No evidence acute pulmonary embolism. 2. Bilateral atelectasis. 3. Ground-glass opacity upper lobes favored mild edema. Pneumonia not favored. 4. Distension of  the gallbladder increased from prior. CT head without contrast 12/29>>No acute intracranial findings or mass lesions. Chest x-ray 1/1>>1. Mild diffuse pulmonary interstitial congestion without frank pulmonary edema. 2. No other active cardiopulmonary disease.  CULTURES: SARS-CoV-2 PCR 12/29>> positive Blood culture x2 1/1>> Strep pneumo urinary antigen 1/1>> Legionella urinary antigen 1/1>> Sputum 1/1>>  ANTIBIOTICS: Remdesivir 1/1>>  HISTORY OF PRESENT ILLNESS:   [name] is a 79 year old male with a past medical history significant for hypertension, hyperlipidemia, paroxysmal atrial fibrillation, vitamin D deficiency, CAD, BPH who presents to [facility] ED on 09/04/2019 due to , generalized malaise, shortness of breath and worsening hypoxia.  He was recently diagnosed with COVID-19 infection on 09/01/2019.  He was placed on 3 L supplemental O2.  Initial work-up in the ED revealed glucose 114, BUN 36, creatinine 2.71, AST 58, ALT 62, LDH 222, ferritin 198, CRP 4.5, lactic acid 1.6, procalcitonin 0.21, WBC 8.7, fibrinogen 591, FDPs 1090.  Chest x-ray is concerning for mild diffuse pulmonary interstitial congestion without frank pulmonary edema, otherwise no active cardiopulmonary disease.  He did exhibit progressive dyspnea and hypoxia while sleeping, of which O2 sats fell to 88%.  He was  subsequently placed on high flow nasal cannula, which he is tolerating with improved oxygenation.  He was to be admitted to the [place] unit by the hospitalist, however while awaiting bed placement, he became hypotensive that was poorly responsive to 2 L IV fluid boluses.  He subsequently required Levophed infusion.  PCCM is asked to admit the patient to ICU for acute hypoxic respiratory failure and septic shock secondary to COVID-19 infection.  PAST MEDICAL HISTORY :   has a past medical history of BPH without obstruction/lower urinary tract symptoms, Coronary artery disease, Hereditary and idiopathic peripheral neuropathy, Hyperlipidemia, Hypertension, Hypertensive heart disease, Insomnia, Paroxysmal atrial fibrillation ([place]), and Vitamin D deficiency.  has a past surgical history that includes Neck surgery; Cardiac catheterization (11/13/12); Cardiac catheterization (2003); and Joint replacement. Prior to Admission medications   Medication Sig Start Date End Date Taking? Authorizing Provider  acetaminophen (TYLENOL) 325 MG tablet Take 650 mg by mouth every 6 (six) hours as needed for mild pain or fever.     [provider]  alum & mag hydroxide-simeth ([place]) 200-200-20 MG/5ML suspension Take 30 mLs by mouth every 6 (six) hours as needed for indigestion or heartburn.    [provider]  amiodarone (PACERONE) 200 MG tablet Take 1 tablet (200 mg total) by mouth at bedtime. 09/30/15   Theora Gianotti, NP  Calcium Carbonate-Vitamin D (CALCIUM 600+D) 600-400 MG-UNIT tablet Take 1 tablet by mouth daily.     [provider]  cetirizine (ZYRTEC) 10 MG tablet Take 10 mg by mouth daily.    [provider]  cholecalciferol (VITAMIN D) 1000 UNITS tablet Take 2,000 Units by mouth daily.     [provider]  clopidogrel (PLAVIX) 75 MG tablet Take 75 mg by mouth daily.  [provider]  diltiazem (DILACOR XR) 240 MG 24 hr capsule Take 240 mg by mouth  daily.     [provider]  finasteride (PROSCAR) 5 MG tablet Take 5 mg by mouth daily.     [provider]  furosemide (LASIX) 20 MG tablet Take 20 mg by mouth daily.    [provider]  gabapentin (NEURONTIN) 600 MG tablet Take 1,200 mg by mouth 3 (three) times daily.     [provider]  Infant Care Products (DERMACLOUD) CREA Apply 1 application topically 2 (two) times daily. (apply to buttocks and sacral area)    [provider]  Lidocaine 4 % PTCH Place 1 patch onto the skin daily. (apply to right shoulder)    [provider]  lisinopril (PRINIVIL,ZESTRIL) 10 MG tablet Take 10 mg by mouth daily. (hold if SBP<110)    [provider]  loperamide (IMODIUM) 2 MG capsule Take 2 mg by mouth daily as needed for diarrhea or loose stools.     [provider]  Melatonin 3 MG TABS Take 1 tablet (3 mg total) by mouth at bedtime. 11/08/15   Ngetich, Dinah C, NP  Menthol-Zinc Oxide (CALMOSEPTINE) 0.44-20.6 % OINT Apply 1 application topically daily. (apply to buttocks)    [provider]  metoprolol succinate (TOPROL-XL) 25 MG 24 hr tablet Take 12.5 mg by mouth 2 (two) times daily.    [provider]  neomycin-bacitracin-polymyxin (NEOSPORIN) [phone] ointment Apply 1 application topically daily as needed (tears or abrasions).    [provider]  nitroGLYCERIN (NITROSTAT) 0.4 MG SL tablet Place 0.4 mg under the tongue every 5 (five) minutes as needed for chest pain.    [provider]  nystatin (MYCOSTATIN/NYSTOP) powder Apply 1 application topically 4 (four) times daily.     [provider]  ondansetron (ZOFRAN) 4 MG tablet Take 4 mg by mouth every 6 (six) hours as needed for nausea or vomiting.     [provider]  Oxycodone HCl 20 MG TABS Take 0.5 tablets (10 mg total) by mouth 3 (three) times daily. 08/15/19   Dhungel, Nishant, MD  polyethylene glycol (MIRALAX / GLYCOLAX) 17 g  packet Take 17 g by mouth daily as needed for mild constipation.    [provider]  senna (SENOKOT) 8.6 MG tablet Take 2 tablets by mouth daily.     [provider]  simvastatin (ZOCOR) 10 MG tablet Take 10 mg by mouth at bedtime.    [provider]  Skin Protectants, Misc. (MINERIN CREME) CREA Apply 1 application topically daily. (apply to legs and knees)    [provider]  tamsulosin (FLOMAX) 0.4 MG CAPS Take 0.4 mg by mouth daily.     [provider]  triamcinolone ointment (KENALOG) 0.1 % Apply 1 application topically 2 (two) times daily. (apply to feet, lower legs and ankles)    [provider]  vitamin B-12 (CYANOCOBALAMIN) 1000 MCG tablet Take 1,000 mcg by mouth daily.    [provider]   Allergies  Allergen Reactions  . Aspirin     FAMILY HISTORY:  family history is not on file. SOCIAL HISTORY:  reports that he has quit smoking. He has a 2.00 pack-year smoking history. He has never used smokeless tobacco. He reports that he does not drink alcohol or use drugs.   COVID-19 DISASTER DECLARATION:  FULL CONTACT PHYSICAL EXAMINATION WAS NOT POSSIBLE DUE TO TREATMENT OF COVID-19 AND  CONSERVATION OF PERSONAL PROTECTIVE EQUIPMENT,  LIMITED EXAM FINDINGS INCLUDE-  Patient assessed or the symptoms described in the history of present illness.  In the context of the Global COVID-19 pandemic, which necessitated consideration that the patient might be at risk for infection with the SARS-CoV-2 virus that causes COVID-19, Institutional protocols and algorithms that pertain to the evaluation of patients at risk for COVID-19 are in a state of rapid change based on information released by regulatory bodies including the CDC and federal and state organizations. These policies and algorithms were followed during the patient's care while in hospital.  REVIEW OF SYSTEMS:  Positives in BOLD Constitutional: Negative for fever, chills,  weight loss, malaise/fatigue and diaphoresis.  HENT: Negative for hearing loss, ear pain, nosebleeds, congestion, sore throat, neck pain, tinnitus and ear discharge.   Eyes: Negative for blurred vision, double vision, photophobia, pain, discharge and redness.  Respiratory: Negative for cough, hemoptysis, sputum production, shortness of breath, wheezing and stridor.   Cardiovascular: Negative for chest pain, palpitations, orthopnea, claudication, leg swelling and PND.  Gastrointestinal: Negative for heartburn, nausea, vomiting, abdominal pain, diarrhea, constipation, blood in stool and melena.  Genitourinary: Negative for dysuria, urgency, frequency, hematuria and flank pain.  Musculoskeletal: Negative for myalgias, back pain, joint pain and falls.  Skin: Negative for itching and rash.  Neurological: Negative for dizziness, tingling, tremors, sensory change, speech change, focal weakness, seizures, loss of consciousness, weakness and headaches.  Endo/Heme/Allergies: Negative for environmental allergies and polydipsia. Does not bruise/bleed easily.  SUBJECTIVE:   VITAL SIGNS: Temp:  [98.6 F (37 C)] 98.6 F (37 C) (01/01 0343) Pulse Rate:  [55-79] 55 (01/01 0445) Resp:  [12-24] 18 (01/01 0445) BP: (66-124)/(39-56) 107/50 (01/01 0445) SpO2:  [90 %-100 %] 100 % (01/01 0445) Weight:  [81.6 kg] 81.6 kg (01/01 0039)  PHYSICAL EXAMINATION: General:  Mild distress Neuro:  Able to communicate appropriately but sleepy and tired  HEENT:  supple Cardiovascular:  NSR per telemetry Lungs:  Non tachypneic Abdomen:  Non obese  Musculoskeletal:  Moving spontaneously Skin:  Intact   Recent Labs  Lab 09/01/19 1519 09/04/19 0032  NA 137 138  K 3.6 3.6  CL 96* 97*  CO2 29 27  BUN 20 36*  CREATININE 1.44* 2.71*  GLUCOSE 103* 114*   Recent Labs  Lab 09/01/19 1519 09/04/19 0032  HGB 13.8 13.8  HCT 41.9 40.7  WBC 6.4 8.7  PLT 231 221   DG Chest Port 1 View  Result Date:  09/04/2019 CLINICAL DATA:  Initial evaluation for acute respiratory distress. EXAM: PORTABLE CHEST 1 VIEW COMPARISON:  Prior radiograph from 09/01/2019. FINDINGS: Mild cardiomegaly, stable. Mediastinal silhouette within normal limits. Lungs hypoinflated. Mild diffuse pulmonary interstitial congestion without frank alveolar edema. No pleural effusion. No focal infiltrates or consolidative opacity. No pneumothorax. No acute osseous finding. Degenerative changes noted about the left shoulder. IMPRESSION: 1. Mild diffuse pulmonary interstitial congestion without frank pulmonary edema. 2. No other active cardiopulmonary disease. Electronically Signed   By: Jeannine Boga M.D.   On: 09/04/2019 00:40    ASSESSMENT / PLAN:  Acute hypoxic respiratory failure secondary to COVID-19 infection -Supplemental O2 as needed -  -Continue high flow nasal cannula, wean as tolerated -Allow for permissive hypoxia as long as patient asymptomatic -Encourage self proning -Incentive spirometry and flutter valve -Follow intermittent chest x-ray and ABG as needed -Continue IV Decadron and remdesivir -Will hold off on empiric CAP coverage for now as no infiltrates or opacities noted on chest x-ray, and procalcitonin 0.2 in the setting of renal  failure -Trend inflammatory markers -CTA chest on 12/29 negative for PE -Vitamin C, zinc, multivitamin -Monitor fever curve -Trend WBCs and procalcitonin -Follow cultures as above  Septic shock in the setting of COVID-19 infection -Continuous cardiac monitoring -Maintain MAP greater than 65 -Received IV fluid resuscitation in the ED -Levophed as needed to maintain MAP goal -Hold home antihypertensives -will give 1liter LR bolus and try to remove levophed as he is on 42mcg only at this time  AKI -Monitor I&O's / urinary output -Follow BMP -Ensure adequate renal perfusion -Avoid nephrotoxic agents as able -Replace electrolytes as indicated -Gentle IV Fluids -wii  repeat tommorow.     Disposition: ICU Goals of care: Full code VTE prophylaxis: Subcu heparin Updates: Updated patient at bedside 09/04/2019    Critical care provider statement:    Critical care time (minutes):  33   Critical care time was exclusive of:  Separately billable procedures and  treating other patients   Critical care was necessary to treat or prevent imminent or  life-threatening deterioration of the following conditions:  acute covid 19 infection, acute hypoxemic respiratory failure, multiple comorbid conditions.    Critical care was time spent personally by me on the following  activities:  Development of treatment plan with patient or surrogate,  discussions with consultants, evaluation of patient's response to  treatment, examination of patient, obtaining history from patient or  surrogate, ordering and performing treatments and interventions, ordering  and review of laboratory studies and re-evaluation of patient's condition   I assumed direction of critical care for this patient from another  provider in my specialty: no       Ottie Glazier, M.D.  [facility] [place]

## 2019-09-04 NOTE — ED Notes (Signed)
ED TO INPATIENT HANDOFF REPORT  ED Nurse Name and Phone #: ally [phone]  S Name/Age/Gender Joel Wilson. 79 y.o. male Room/Bed: ED17A/ED17A  Code Status   Code Status: Full Code  Home/SNF/Other Nursing Home Patient oriented to: self   Triage Complete: Triage complete  Chief Complaint COVID-19 [U07.1] Acute hypoxemic respiratory failure due to COVID-19 ([place]) [U07.1, J96.01]  Triage Note Pt in via EMS from [facility] d/t declining vitals. Pt is covid positive per facility. Currently satting 96% on 3L via [place]. Pt states not normally on supplemental O2. BP soft per EMS.     Allergies Allergies  Allergen Reactions  . Aspirin     Level of Care/Admitting Diagnosis ED Disposition    ED Disposition Condition [facility] Area: [place] [100120]  Level of Care: ICU [6]  Covid Evaluation: Confirmed COVID Positive  Diagnosis: COVID-19 [id number]  Admitting Physician: Ottie Glazier [id number]  Attending Physician: Ottie Glazier [id number]  Estimated length of stay: 3 - 4 days  Certification:: I certify this patient will need inpatient services for at least 2 midnights       B Medical/Surgery History Past Medical History:  Diagnosis Date  . 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 Coronary artery disease    a. 2003 PCI to [place]us;  b. 11/2012 Cath: LM nl, LAD 30, LCX nl, RI 40 ISR, RCA nl.  . Hereditary and idiopathic peripheral neuropathy   . Hyperlipidemia   . Hypertension   . Hypertensive heart disease    a. 08/2013 Echo: EF 70-75%, mild LVH.  Marland Kitchen Insomnia   . Paroxysmal atrial fibrillation (HCC)    a.08/2013-->amio;  b. CHA2DS2VASc = 4-->no longer on coumadin.  . Vitamin D deficiency    Past Surgical History:  Procedure Laterality Date  . CARDIAC CATHETERIZATION  11/13/12   [facility]; EF >55%  . CARDIAC CATHETERIZATION  2003  . JOINT REPLACEMENT     left hip  . NECK SURGERY       A IV  Location/Drains/Wounds Patient Lines/Drains/Airways Status   Active Line/Drains/Airways    Name:   Placement date:   Placement time:   Site:   Days:   Peripheral IV 09/04/19 Left Wrist   09/04/19    0022    Wrist   less than 1   Peripheral IV 09/04/19 Right Forearm   09/04/19    0053    Forearm   less than 1   Pressure Injury 08/12/19 Buttocks Left Stage III -  Full thickness tissue loss. Subcutaneous fat may be visible but bone, tendon or muscle are NOT exposed.   08/12/19    0008     23   Pressure Injury 08/12/19 Buttocks Right Stage III -  Full thickness tissue loss. Subcutaneous fat may be visible but bone, tendon or muscle are NOT exposed.   08/12/19    0009     23   Pressure Injury 08/12/19 Heel Right Unstageable - Full thickness tissue loss in which the base of the ulcer is covered by slough (yellow, tan, gray, green or brown) and/or eschar (tan, brown or black) in the wound bed.   08/12/19    0010     23          Intake/Output Last 24 hours No intake or output data in the 24 hours ending 09/04/19 [id number]  Labs/Imaging Results for orders placed or performed during the hospital encounter of 09/03/19 (from the past  48 hour(s))  Lactic acid, plasma     Status: None   Collection Time: 09/04/19 12:32 AM  Result Value Ref Range   Lactic Acid, Venous 1.6 0.5 - 1.9 mmol/L    Comment: Performed at [facility], [address] [place], [place] [address]  CBC WITH DIFFERENTIAL     Status: None   Collection Time: 09/04/19 12:32 AM  Result Value Ref Range   WBC 8.7 4.0 - 10.5 K/uL   RBC 4.40 4.22 - 5.81 MIL/uL   Hemoglobin 13.8 13.0 - 17.0 g/dL   HCT 40.7 39.0 - 52.0 %   MCV 92.5 80.0 - 100.0 fL   MCH 31.4 26.0 - 34.0 pg   MCHC 33.9 30.0 - 36.0 g/dL   RDW 13.0 11.5 - 15.5 %   Platelets 221 150 - 400 K/uL   nRBC 0.0 0.0 - 0.2 %   Neutrophils Relative % 77 %   Neutro Abs 6.7 1.7 - 7.7 K/uL   Lymphocytes Relative 12 %   Lymphs Abs 1.1 0.7 - 4.0 K/uL   Monocytes Relative 10 %    Monocytes Absolute 0.9 0.1 - 1.0 K/uL   Eosinophils Relative 0 %   Eosinophils Absolute 0.0 0.0 - 0.5 K/uL   Basophils Relative 0 %   Basophils Absolute 0.0 0.0 - 0.1 K/uL   Immature Granulocytes 1 %   Abs Immature Granulocytes 0.04 0.00 - 0.07 K/uL    Comment: Performed at [facility], [address] [place], [place] [address]  Comprehensive metabolic panel     Status: Abnormal   Collection Time: 09/04/19 12:32 AM  Result Value Ref Range   Sodium 138 135 - 145 mmol/L   Potassium 3.6 3.5 - 5.1 mmol/L   Chloride 97 (L) 98 - 111 mmol/L   CO2 27 22 - 32 mmol/L   Glucose, Bld 114 (H) 70 - 99 mg/dL   BUN 36 (H) 8 - 23 mg/dL   Creatinine, Ser 2.71 (H) 0.61 - 1.24 mg/dL   Calcium 8.6 (L) 8.9 - 10.3 mg/dL   Total Protein 7.3 6.5 - 8.1 g/dL   Albumin 3.1 (L) 3.5 - 5.0 g/dL   AST 58 (H) 15 - 41 U/L   ALT 62 (H) 0 - 44 U/L   Alkaline Phosphatase 75 38 - 126 U/L   Total Bilirubin 1.0 0.3 - 1.2 mg/dL   GFR calc non Af Amer 21 (L) >60 mL/min   GFR calc Af Amer 25 (L) >60 mL/min   Anion gap 14 5 - 15    Comment: Performed at [facility], [address] [place], [place] [address]  Fibrin derivatives D-Dimer     Status: Abnormal   Collection Time: 09/04/19 12:32 AM  Result Value Ref Range   Fibrin derivatives D-dimer ([facility]) 1,090.12 (H) 0.00 - 499.00 ng/mL (FEU)    Comment: (NOTE) <> Exclusion of Venous Thromboembolism (VTE) - OUTPATIENT ONLY   (Emergency Department or [place])   0-499 ng/ml (FEU): With a low to intermediate pretest probability                      for VTE this test result excludes the diagnosis                      of VTE.   >499 ng/ml (FEU) : VTE not excluded; additional work up for VTE is  required. <> Testing on Inpatients and Evaluation of Disseminated Intravascular   Coagulation (DIC) Reference Range:   0-499 ng/ml (FEU) Performed at [facility] [address] [place], [place] [address]   Procalcitonin      Status: None   Collection Time: 09/04/19 12:32 AM  Result Value Ref Range   Procalcitonin 0.21 ng/mL    Comment:        Interpretation: PCT (Procalcitonin) <= 0.5 ng/mL: Systemic infection (sepsis) is not likely. Local bacterial infection is possible. (NOTE)       Sepsis PCT Algorithm           Lower Respiratory Tract                                      Infection PCT Algorithm    ----------------------------     ----------------------------         PCT < 0.25 ng/mL                PCT < 0.10 ng/mL         Strongly encourage             Strongly discourage   discontinuation of antibiotics    initiation of antibiotics    ----------------------------     -----------------------------       PCT 0.25 - 0.50 ng/mL            PCT 0.10 - 0.25 ng/mL               OR       >80% decrease in PCT            Discourage initiation of                                            antibiotics      Encourage discontinuation           of antibiotics    ----------------------------     -----------------------------         PCT >= 0.50 ng/mL              PCT 0.26 - 0.50 ng/mL               AND        <80% decrease in PCT             Encourage initiation of                                             antibiotics       Encourage continuation           of antibiotics    ----------------------------     -----------------------------        PCT >= 0.50 ng/mL                  PCT > 0.50 ng/mL               AND         increase in PCT                  Strongly encourage  initiation of antibiotics    Strongly encourage escalation           of antibiotics                                     -----------------------------                                           PCT <= 0.25 ng/mL                                                 OR                                        > 80% decrease in PCT                                     Discontinue / Do not initiate                                              antibiotics Performed at [facility], [address] [place], [place] [address]   Lactate dehydrogenase     Status: Abnormal   Collection Time: 09/04/19 12:32 AM  Result Value Ref Range   LDH 222 (H) 98 - 192 U/L    Comment: Performed at [facility], [address] [place], [place] [address]  Ferritin     Status: None   Collection Time: 09/04/19 12:32 AM  Result Value Ref Range   Ferritin 198 24 - 336 ng/mL    Comment: Performed at [facility], [address] [place], [place] [address]  Triglycerides     Status: None   Collection Time: 09/04/19 12:32 AM  Result Value Ref Range   Triglycerides 107 <150 mg/dL    Comment: Performed at [facility], [address] [place], [place] [address]  Fibrinogen     Status: Abnormal   Collection Time: 09/04/19 12:32 AM  Result Value Ref Range   Fibrinogen 591 (H) 210 - 475 mg/dL    Comment: Performed at [facility], [address] [place], [place] [address]  C-reactive protein     Status: Abnormal   Collection Time: 09/04/19 12:32 AM  Result Value Ref Range   CRP 4.5 (H) <1.0 mg/dL    Comment: Performed at [facility], [place] [address], [place], [place] [address]   DG Chest Port 1 View  Result Date: 09/04/2019 CLINICAL DATA:  Initial evaluation for acute respiratory distress. EXAM: PORTABLE CHEST 1 VIEW COMPARISON:  Prior radiograph from 09/01/2019. FINDINGS: Mild cardiomegaly, stable. Mediastinal silhouette within normal limits. Lungs hypoinflated. Mild diffuse pulmonary interstitial congestion without frank alveolar edema. No pleural effusion. No focal infiltrates or consolidative opacity. No pneumothorax. No acute osseous finding. Degenerative changes noted about the left shoulder. IMPRESSION: 1. Mild diffuse pulmonary interstitial congestion without  frank pulmonary edema. 2. No other active cardiopulmonary disease. Electronically Signed   By: Jeannine Boga M.D.   On: 09/04/2019 00:40    Pending Labs Unresulted Labs (From admission, onward)    Start     Ordered   09/05/19 0500  CBC  Tomorrow morning,   STAT     09/04/19 0455   09/05/19 0500  C-reactive protein  Tomorrow morning,   STAT     09/04/19 0455   09/05/19 0500  Ferritin  Tomorrow morning,   STAT     09/04/19 0455   09/05/19 0500  Fibrin derivatives D-Dimer ([facility] only)  Tomorrow morning,   STAT     09/04/19 0455   09/05/19 0500  Lactate dehydrogenase  Tomorrow morning,   STAT     09/04/19 0455   09/05/19 0500  Procalcitonin  Daily,   STAT     09/04/19 0456   09/05/19 0500  Comprehensive metabolic panel  Tomorrow morning,   STAT     09/04/19 0512   09/04/19 0451  Expectorated sputum assessment w rflx to resp cult  Once,   STAT     09/04/19 0450   09/04/19 0451  Strep pneumoniae urinary antigen  Once,   STAT     09/04/19 0450   09/04/19 0451  Legionella Pneumophila Serogp 1 Ur Ag  Once,   STAT     09/04/19 0450   09/04/19 0007  Blood Culture (routine x 2)  BLOOD CULTURE X 2,   STAT     09/04/19 0009          Vitals/Pain Today's Vitals   09/04/19 0605 09/04/19 0615 09/04/19 0645 09/04/19 0715  BP: (!) 109/49 (!) 107/48 (!) 117/51 (!) 105/49  Pulse: (!) 57 (!) 56 (!) 55 (!) 53  Resp: 15 15 14 14   Temp:      TempSrc:      SpO2: 97% 99% 99% 100%  Weight:      Height:      PainSc:        Isolation Precautions Airborne and Contact precautions  Medications Medications  remdesivir 200 mg in sodium chloride 0.9% 250 mL IVPB (0 mg Intravenous Stopped 09/04/19 0227)    Followed by  remdesivir 100 mg in sodium chloride 0.9 % 100 mL IVPB (has no administration in time range)  norepinephrine (LEVOPHED) 4mg  in 257mL premix infusion (4 mcg/min Intravenous Rate/Dose Change 09/04/19 0600)  acetaminophen (TYLENOL) tablet 650 mg (has no administration in time range)  pantoprazole (PROTONIX) injection 40 mg (has no administration in time range)  dexamethasone (DECADRON)  injection 6 mg (6 mg Intravenous Given 09/04/19 0508)  ascorbic acid (VITAMIN C) tablet 500 mg (has no administration in time range)  multivitamin with minerals tablet 1 tablet (has no administration in time range)  zinc sulfate capsule 220 mg (has no administration in time range)  alum & mag hydroxide-simeth (MAALOX/MYLANTA) 200-200-20 MG/5ML suspension 30 mL (has no administration in time range)  amiodarone (PACERONE) tablet 200 mg (has no administration in time range)  loratadine (CLARITIN) tablet 10 mg (has no administration in time range)  clopidogrel (PLAVIX) tablet 75 mg (has no administration in time range)  finasteride (PROSCAR) tablet 5 mg (has no administration in time range)  polyethylene glycol (MIRALAX / GLYCOLAX) packet 17 g (has no administration in time range)  senna (SENOKOT) tablet 17.2 mg (has no administration in time range)  simvastatin (ZOCOR) tablet 10 mg (has no administration in time range)  tamsulosin (FLOMAX)  capsule 0.4 mg (has no administration in time range)  heparin injection 5,000 Units (5,000 Units Subcutaneous Given 09/04/19 0604)  gabapentin (NEURONTIN) capsule 100 mg (has no administration in time range)  sodium chloride 0.9 % bolus 1,000 mL (0 mLs Intravenous Stopped 09/04/19 0118)  sodium chloride 0.9 % bolus 1,000 mL (0 mLs Intravenous Stopped 09/04/19 0156)  dexamethasone (DECADRON) injection 10 mg (10 mg Intravenous Given 09/04/19 0122)    Mobility non-ambulatory Moderate fall risk   Focused Assessments Cardiac Assessment Handoff:  Cardiac Rhythm: Sinus bradycardia Lab Results  Component Value Date   CKTOTAL 205 09/01/2019   CKMB 2.1 08/29/2013   TROPONINI <0.03 10/10/2015   No results found for: DDIMER Does the Patient currently have chest pain? No     R Recommendations: See Admitting Provider Note  Report given to:   Additional Notes:

## 2019-09-04 NOTE — ED Notes (Signed)
Janett Billow RN attempted IV access/blood cultures x1

## 2019-09-04 NOTE — ED Notes (Signed)
Pt brief clean and dry. 

## 2019-09-04 NOTE — ED Notes (Signed)
Pt fatigued. Asleep when this RN entered room. Easily woken.

## 2019-09-04 NOTE — ED Notes (Addendum)
Called Resp therapy. State will set pt up with a high flow [place] soon. Lights dimmed for pt. Bed locked low. Rails up. Call bell within reach.

## 2019-09-05 LAB — COMPREHENSIVE METABOLIC PANEL
ALT: 59 U/L — ABNORMAL HIGH (ref 0–44)
AST: 48 U/L — ABNORMAL HIGH (ref 15–41)
Albumin: 2.5 g/dL — ABNORMAL LOW (ref 3.5–5.0)
Alkaline Phosphatase: 58 U/L (ref 38–126)
Anion gap: 13 (ref 5–15)
BUN: 41 mg/dL — ABNORMAL HIGH (ref 8–23)
CO2: 24 mmol/L (ref 22–32)
Calcium: 8.1 mg/dL — ABNORMAL LOW (ref 8.9–10.3)
Chloride: 103 mmol/L (ref 98–111)
Creatinine, Ser: 1.72 mg/dL — ABNORMAL HIGH (ref 0.61–1.24)
GFR calc Af Amer: 43 mL/min — ABNORMAL LOW (ref >60)
GFR calc non Af Amer: 37 mL/min — ABNORMAL LOW (ref >60)
Glucose, Bld: 132 mg/dL — ABNORMAL HIGH (ref 70–99)
Potassium: 3 mmol/L — ABNORMAL LOW (ref 3.5–5.1)
Sodium: 140 mmol/L (ref 135–145)
Total Bilirubin: 0.8 mg/dL (ref 0.3–1.2)
Total Protein: 6 g/dL — ABNORMAL LOW (ref 6.5–8.1)

## 2019-09-05 LAB — CBC
HCT: 36 % — ABNORMAL LOW (ref 39.0–52.0)
Hemoglobin: 11.9 g/dL — ABNORMAL LOW (ref 13.0–17.0)
MCH: 31.6 pg (ref 26.0–34.0)
MCHC: 33.1 g/dL (ref 30.0–36.0)
MCV: 95.5 fL (ref 80.0–100.0)
Platelets: 190 10*3/uL (ref 150–400)
RBC: 3.77 MIL/uL — ABNORMAL LOW (ref 4.22–5.81)
RDW: 12.6 % (ref 11.5–15.5)
WBC: 13.5 10*3/uL — ABNORMAL HIGH (ref 4.0–10.5)
nRBC: 0 % (ref 0.0–0.2)

## 2019-09-05 LAB — C-REACTIVE PROTEIN: CRP: 2.3 mg/dL — ABNORMAL HIGH (ref <1.0)

## 2019-09-05 LAB — PROCALCITONIN: Procalcitonin: <0.1 ng/mL

## 2019-09-05 LAB — FIBRIN DERIVATIVES D-DIMER (ARMC ONLY): Fibrin derivatives D-dimer (ARMC): 1096.32 ng/mL (FEU) — ABNORMAL HIGH (ref 0.00–499.00)

## 2019-09-05 LAB — PHOSPHORUS: Phosphorus: 3.3 mg/dL (ref 2.5–4.6)

## 2019-09-05 LAB — FERRITIN: Ferritin: 160 ng/mL (ref 24–336)

## 2019-09-05 LAB — LACTATE DEHYDROGENASE: LDH: 179 U/L (ref 98–192)

## 2019-09-05 LAB — MAGNESIUM: Magnesium: 2.1 mg/dL (ref 1.7–2.4)

## 2019-09-05 MED ORDER — TRAMADOL HCL 50 MG PO TABS
50.0000 mg | ORAL_TABLET | Freq: Four times a day (QID) | ORAL | Status: DC | PRN
  Administered 2019-09-05 – 2019-09-08 (×9): 50 mg via ORAL
  Filled 2019-09-05 (×9): qty 1

## 2019-09-05 MED ORDER — POTASSIUM CHLORIDE CRYS ER 20 MEQ PO TBCR
30.0000 meq | EXTENDED_RELEASE_TABLET | ORAL | Status: AC
  Administered 2019-09-05 (×2): 30 meq via ORAL
  Filled 2019-09-05 (×2): qty 2

## 2019-09-05 NOTE — Progress Notes (Signed)
Name: Joel Wilson. MRN: [id number] DOB: 1941/08/26    ADMISSION DATE:  09/03/2019 CONSULTATION DATE:  09/04/2019  REFERRING MD :  Dr. Owens Shark  CHIEF COMPLAINT:  Shortness of breath  BRIEF PATIENT DESCRIPTION:  79 year old male admitted 1/1 with acute hypoxic respiratory failure and septic shock requiring vasopressors in the setting of COVID-19 infection.  SIGNIFICANT EVENTS  1/1>>Admission to ICU 1/2 - off vasopressors, on 2L nasal canula now, will optmize for downgrade to medical floor  STUDIES:  CTA chest 12/29>>1. No evidence acute pulmonary embolism. 2. Bilateral atelectasis. 3. Ground-glass opacity upper lobes favored mild edema. Pneumonia not favored. 4. Distension of  the gallbladder increased from prior. CT head without contrast 12/29>>No acute intracranial findings or mass lesions. Chest x-ray 1/1>>1. Mild diffuse pulmonary interstitial congestion without frank pulmonary edema. 2. No other active cardiopulmonary disease.  CULTURES: SARS-CoV-2 PCR 12/29>> positive Blood culture x2 1/1>> Strep pneumo urinary antigen 1/1>> Legionella urinary antigen 1/1>> Sputum 1/1>>  ANTIBIOTICS: Remdesivir 1/1>>  HISTORY OF PRESENT ILLNESS:   [name] is a 79 year old male with a past medical history significant for hypertension, hyperlipidemia, paroxysmal atrial fibrillation, vitamin D deficiency, CAD, BPH who presents to [facility] ED on 09/04/2019 due to , generalized malaise, shortness of breath and worsening hypoxia.  He was recently diagnosed with COVID-19 infection on 09/01/2019.  He was placed on 3 L supplemental O2.  Initial work-up in the ED revealed glucose 114, BUN 36, creatinine 2.71, AST 58, ALT 62, LDH 222, ferritin 198, CRP 4.5, lactic acid 1.6, procalcitonin 0.21, WBC 8.7, fibrinogen 591, FDPs 1090.  Chest x-ray is concerning for mild diffuse pulmonary interstitial congestion without frank pulmonary edema, otherwise no active cardiopulmonary disease.  He did exhibit  progressive dyspnea and hypoxia while sleeping, of which O2 sats fell to 88%.  He was subsequently placed on high flow nasal cannula, which he is tolerating with improved oxygenation.  He was to be admitted to the [place] unit by the hospitalist, however while awaiting bed placement, he became hypotensive that was poorly responsive to 2 L IV fluid boluses.  He subsequently required Levophed infusion.  PCCM is asked to admit the patient to ICU for acute hypoxic respiratory failure and septic shock secondary to COVID-19 infection.  PAST MEDICAL HISTORY :   has a past medical history of BPH without obstruction/lower urinary tract symptoms, Coronary artery disease, Hereditary and idiopathic peripheral neuropathy, Hyperlipidemia, Hypertension, Hypertensive heart disease, Insomnia, Paroxysmal atrial fibrillation ([place]), and Vitamin D deficiency.  has a past surgical history that includes Neck surgery; Cardiac catheterization (11/13/12); Cardiac catheterization (2003); and Joint replacement. Prior to Admission medications   Medication Sig Start Date End Date Taking? Authorizing Provider  acetaminophen (TYLENOL) 325 MG tablet Take 650 mg by mouth every 6 (six) hours as needed for mild pain or fever.     [provider]  alum & mag hydroxide-simeth ([place]) 200-200-20 MG/5ML suspension Take 30 mLs by mouth every 6 (six) hours as needed for indigestion or heartburn.    [provider]  amiodarone (PACERONE) 200 MG tablet Take 1 tablet (200 mg total) by mouth at bedtime. 09/30/15   Theora Gianotti, NP  Calcium Carbonate-Vitamin D (CALCIUM 600+D) 600-400 MG-UNIT tablet Take 1 tablet by mouth daily.     [provider]  cetirizine (ZYRTEC) 10 MG tablet Take 10 mg by mouth daily.    [provider]  cholecalciferol (VITAMIN D) 1000 UNITS tablet Take 2,000 Units by mouth daily.  [provider]  clopidogrel (PLAVIX) 75 MG tablet Take 75 mg by mouth daily.     [provider]  diltiazem (DILACOR XR) 240 MG 24 hr capsule Take 240 mg by mouth daily.     [provider]  finasteride (PROSCAR) 5 MG tablet Take 5 mg by mouth daily.     [provider]  furosemide (LASIX) 20 MG tablet Take 20 mg by mouth daily.    [provider]  gabapentin (NEURONTIN) 600 MG tablet Take 1,200 mg by mouth 3 (three) times daily.     [provider]  Infant Care Products (DERMACLOUD) CREA Apply 1 application topically 2 (two) times daily. (apply to buttocks and sacral area)    [provider]  Lidocaine 4 % PTCH Place 1 patch onto the skin daily. (apply to right shoulder)    [provider]  lisinopril (PRINIVIL,ZESTRIL) 10 MG tablet Take 10 mg by mouth daily. (hold if SBP<110)    [provider]  loperamide (IMODIUM) 2 MG capsule Take 2 mg by mouth daily as needed for diarrhea or loose stools.     [provider]  Melatonin 3 MG TABS Take 1 tablet (3 mg total) by mouth at bedtime. 11/08/15   Ngetich, Dinah C, NP  Menthol-Zinc Oxide (CALMOSEPTINE) 0.44-20.6 % OINT Apply 1 application topically daily. (apply to buttocks)    [provider]  metoprolol succinate (TOPROL-XL) 25 MG 24 hr tablet Take 12.5 mg by mouth 2 (two) times daily.    [provider]  neomycin-bacitracin-polymyxin (NEOSPORIN) [phone] ointment Apply 1 application topically daily as needed (tears or abrasions).    [provider]  nitroGLYCERIN (NITROSTAT) 0.4 MG SL tablet Place 0.4 mg under the tongue every 5 (five) minutes as needed for chest pain.    [provider]  nystatin (MYCOSTATIN/NYSTOP) powder Apply 1 application topically 4 (four) times daily.     [provider]  ondansetron (ZOFRAN) 4 MG tablet Take 4 mg by mouth every 6 (six) hours as needed for nausea or vomiting.     [provider]  Oxycodone HCl 20 MG TABS Take 0.5 tablets (10 mg total) by mouth 3 (three) times  daily. 08/15/19   Dhungel, Nishant, MD  polyethylene glycol (MIRALAX / GLYCOLAX) 17 g packet Take 17 g by mouth daily as needed for mild constipation.    [provider]  senna (SENOKOT) 8.6 MG tablet Take 2 tablets by mouth daily.     [provider]  simvastatin (ZOCOR) 10 MG tablet Take 10 mg by mouth at bedtime.    [provider]  Skin Protectants, Misc. (MINERIN CREME) CREA Apply 1 application topically daily. (apply to legs and knees)    [provider]  tamsulosin (FLOMAX) 0.4 MG CAPS Take 0.4 mg by mouth daily.     [provider]  triamcinolone ointment (KENALOG) 0.1 % Apply 1 application topically 2 (two) times daily. (apply to feet, lower legs and ankles)    [provider]  vitamin B-12 (CYANOCOBALAMIN) 1000 MCG tablet Take 1,000 mcg by mouth daily.    [provider]   Allergies  Allergen Reactions  . Aspirin     FAMILY HISTORY:  family history is not on file. SOCIAL HISTORY:  reports that he has quit smoking. He has a 2.00 pack-year smoking history. He has never used smokeless tobacco. He reports that he does not drink alcohol or use drugs.   COVID-19 DISASTER DECLARATION:  FULL  CONTACT PHYSICAL EXAMINATION WAS NOT POSSIBLE DUE TO TREATMENT OF COVID-19 AND  CONSERVATION OF PERSONAL PROTECTIVE EQUIPMENT, LIMITED EXAM FINDINGS INCLUDE-  Patient assessed or the symptoms described in the history of present illness.  In the context of the Global COVID-19 pandemic, which necessitated consideration that the patient might be at risk for infection with the SARS-CoV-2 virus that causes COVID-19, Institutional protocols and algorithms that pertain to the evaluation of patients at risk for COVID-19 are in a state of rapid change based on information released by regulatory bodies including the CDC and federal and state organizations. These policies and algorithms were followed during the patient's care while in  hospital.  REVIEW OF SYSTEMS:  Positives in BOLD Constitutional: Negative for fever, chills, weight loss, malaise/fatigue and diaphoresis.  HENT: Negative for hearing loss, ear pain, nosebleeds, congestion, sore throat, neck pain, tinnitus and ear discharge.   Eyes: Negative for blurred vision, double vision, photophobia, pain, discharge and redness.  Respiratory: Negative for cough, hemoptysis, sputum production, shortness of breath, wheezing and stridor.   Cardiovascular: Negative for chest pain, palpitations, orthopnea, claudication, leg swelling and PND.  Gastrointestinal: Negative for heartburn, nausea, vomiting, abdominal pain, diarrhea, constipation, blood in stool and melena.  Genitourinary: Negative for dysuria, urgency, frequency, hematuria and flank pain.  Musculoskeletal: Negative for myalgias, back pain, joint pain and falls.  Skin: Negative for itching and rash.  Neurological: Negative for dizziness, tingling, tremors, sensory change, speech change, focal weakness, seizures, loss of consciousness, weakness and headaches.  Endo/Heme/Allergies: Negative for environmental allergies and polydipsia. Does not bruise/bleed easily.  SUBJECTIVE:   VITAL SIGNS: Temp:  [98 F (36.7 C)-98.9 F (37.2 C)] 98.8 F (37.1 C) (01/02 0800) Pulse Rate:  [56-86] 59 (01/02 1100) Resp:  [12-26] 13 (01/02 1100) BP: (88-165)/(44-138) 141/60 (01/02 1100) SpO2:  [91 %-100 %] 96 % (01/02 1100)  PHYSICAL EXAMINATION: General:  Mild distress Neuro:  Able to communicate appropriately but sleepy and tired  HEENT:  supple Cardiovascular:  NSR per telemetry Lungs:  Non tachypneic Abdomen:  Non obese  Musculoskeletal:  Moving spontaneously Skin:  Intact   Recent Labs  Lab 09/01/19 1519 09/04/19 0032 09/05/19 0427  NA 137 138 140  K 3.6 3.6 3.0*  CL 96* 97* 103  CO2 29 27 24   BUN 20 36* 41*  CREATININE 1.44* 2.71* 1.72*  GLUCOSE 103* 114* 132*   Recent Labs  Lab 09/01/19 1519  09/04/19 0032 09/05/19 0427  HGB 13.8 13.8 11.9*  HCT 41.9 40.7 36.0*  WBC 6.4 8.7 13.5*  PLT 231 221 190   DG Chest Port 1 View  Result Date: 09/04/2019 CLINICAL DATA:  Initial evaluation for acute respiratory distress. EXAM: PORTABLE CHEST 1 VIEW COMPARISON:  Prior radiograph from 09/01/2019. FINDINGS: Mild cardiomegaly, stable. Mediastinal silhouette within normal limits. Lungs hypoinflated. Mild diffuse pulmonary interstitial congestion without frank alveolar edema. No pleural effusion. No focal infiltrates or consolidative opacity. No pneumothorax. No acute osseous finding. Degenerative changes noted about the left shoulder. IMPRESSION: 1. Mild diffuse pulmonary interstitial congestion without frank pulmonary edema. 2. No other active cardiopulmonary disease. Electronically Signed   By: Jeannine Boga M.D.   On: 09/04/2019 00:40    ASSESSMENT / PLAN:  Acute hypoxic respiratory failure secondary to COVID-19 infection -Supplemental O2 as needed -  -Continue high flow nasal cannula, wean as tolerated -Allow for permissive hypoxia as long as patient asymptomatic -Encourage self proning -Incentive spirometry and flutter valve -Follow intermittent chest x-ray and ABG as needed -Continue IV Decadron  and remdesivir -Will hold off on empiric CAP coverage for now as no infiltrates or opacities noted on chest x-ray, and procalcitonin 0.2 in the setting of renal failure -Trend inflammatory markers -CTA chest on 12/29 negative for PE -Vitamin C, zinc, multivitamin -Monitor fever curve -Trend WBCs and procalcitonin -Follow cultures as above  Septic shock in the setting of COVID-19 infection -Continuous cardiac monitoring -Maintain MAP greater than 65 -Received IV fluid resuscitation in the ED -Levophed as needed to maintain MAP goal -Hold home antihypertensives -will give 1liter LR bolus and try to remove levophed as he is on 49mcg only at this time  AKI -Monitor I&O's / urinary  output -Follow BMP -Ensure adequate renal perfusion -Avoid nephrotoxic agents as able -Replace electrolytes as indicated -Gentle IV Fluids -wii repeat tommorow.     Disposition: ICU Goals of care: Full code VTE prophylaxis: Subcu heparin Updates: Updated patient at bedside 09/04/2019    Critical care provider statement:    Critical care time (minutes):  33   Critical care time was exclusive of:  Separately billable procedures and  treating other patients   Critical care was necessary to treat or prevent imminent or  life-threatening deterioration of the following conditions:  acute covid 19 infection, acute hypoxemic respiratory failure, multiple comorbid conditions.    Critical care was time spent personally by me on the following  activities:  Development of treatment plan with patient or surrogate,  discussions with consultants, evaluation of patient's response to  treatment, examination of patient, obtaining history from patient or  surrogate, ordering and performing treatments and interventions, ordering  and review of laboratory studies and re-evaluation of patient's condition   I assumed direction of critical care for this patient from another  provider in my specialty: no       Ottie Glazier, M.D.  [facility]

## 2019-09-06 ENCOUNTER — Encounter: Payer: Self-pay | Admitting: Pulmonary Disease

## 2019-09-06 DIAGNOSIS — J9601 Acute respiratory failure with hypoxia: Secondary | ICD-10-CM

## 2019-09-06 DIAGNOSIS — U071 COVID-19: Secondary | ICD-10-CM

## 2019-09-06 LAB — CBC WITH DIFFERENTIAL/PLATELET
Abs Immature Granulocytes: 0.05 10*3/uL (ref 0.00–0.07)
Basophils Absolute: 0 10*3/uL (ref 0.0–0.1)
Basophils Relative: 0 %
Eosinophils Absolute: 0 10*3/uL (ref 0.0–0.5)
Eosinophils Relative: 0 %
HCT: 33.2 % — ABNORMAL LOW (ref 39.0–52.0)
Hemoglobin: 11.6 g/dL — ABNORMAL LOW (ref 13.0–17.0)
Immature Granulocytes: 0 %
Lymphocytes Relative: 8 %
Lymphs Abs: 0.9 10*3/uL (ref 0.7–4.0)
MCH: 31.6 pg (ref 26.0–34.0)
MCHC: 34.9 g/dL (ref 30.0–36.0)
MCV: 90.5 fL (ref 80.0–100.0)
Monocytes Absolute: 0.4 10*3/uL (ref 0.1–1.0)
Monocytes Relative: 4 %
Neutro Abs: 9.9 10*3/uL — ABNORMAL HIGH (ref 1.7–7.7)
Neutrophils Relative %: 88 %
Platelets: 158 10*3/uL (ref 150–400)
RBC: 3.67 MIL/uL — ABNORMAL LOW (ref 4.22–5.81)
RDW: 12.5 % (ref 11.5–15.5)
WBC: 11.2 10*3/uL — ABNORMAL HIGH (ref 4.0–10.5)
nRBC: 0 % (ref 0.0–0.2)

## 2019-09-06 LAB — COMPREHENSIVE METABOLIC PANEL
ALT: 65 U/L — ABNORMAL HIGH (ref 0–44)
AST: 47 U/L — ABNORMAL HIGH (ref 15–41)
Albumin: 2.6 g/dL — ABNORMAL LOW (ref 3.5–5.0)
Alkaline Phosphatase: 54 U/L (ref 38–126)
Anion gap: 9 (ref 5–15)
BUN: 36 mg/dL — ABNORMAL HIGH (ref 8–23)
CO2: 25 mmol/L (ref 22–32)
Calcium: 8.3 mg/dL — ABNORMAL LOW (ref 8.9–10.3)
Chloride: 107 mmol/L (ref 98–111)
Creatinine, Ser: 1.3 mg/dL — ABNORMAL HIGH (ref 0.61–1.24)
GFR calc Af Amer: >60 mL/min (ref >60)
GFR calc non Af Amer: 52 mL/min — ABNORMAL LOW (ref >60)
Glucose, Bld: 141 mg/dL — ABNORMAL HIGH (ref 70–99)
Potassium: 4 mmol/L (ref 3.5–5.1)
Sodium: 141 mmol/L (ref 135–145)
Total Bilirubin: 0.7 mg/dL (ref 0.3–1.2)
Total Protein: 5.9 g/dL — ABNORMAL LOW (ref 6.5–8.1)

## 2019-09-06 LAB — PROCALCITONIN: Procalcitonin: <0.1 ng/mL

## 2019-09-06 MED ORDER — DEXAMETHASONE 6 MG PO TABS: 6.0000 mg | ORAL_TABLET | Freq: Every day | ORAL | 0 refills | Status: AC

## 2019-09-06 MED ORDER — METHOCARBAMOL 500 MG PO TABS
500.0000 mg | ORAL_TABLET | Freq: Three times a day (TID) | ORAL | Status: DC
  Administered 2019-09-06 – 2019-09-08 (×6): 500 mg via ORAL
  Filled 2019-09-06 (×8): qty 1

## 2019-09-06 MED ORDER — ENSURE ENLIVE PO LIQD
237.0000 mL | Freq: Three times a day (TID) | ORAL | Status: DC
  Administered 2019-09-06 – 2019-09-08 (×2): 237 mL via ORAL

## 2019-09-06 NOTE — Progress Notes (Signed)
PROGRESS NOTE    Joel Wilson.  [id number] DOB: 29-Jul-1941 DOA: 09/03/2019 PCP: [facility]    Brief Narrative:  79 year old male admitted 1/1 with acute hypoxic respiratory failure and septic shock requiring vasopressors in the setting of COVID-19 infection.  1/1>>Admission to ICU 1/2 - off vasopressors, on 2L nasal canula now, will optmize for downgrade to medical floor 1/3: Remained stable off pressors.  On room air.  Feels well. Transferred to med surg/hospitalist service   Assessment & Plan:   Active Problems:   COVID-19   Acute hypoxemic respiratory failure due to COVID-19 [facility])  Acute hypoxic respiratory failure secondary to COVID-19 infection -Supplemental O2 as needed -  -Continue high flow nasal cannula, wean as tolerated -Allow for permissive hypoxia as long as patient asymptomatic -Encourage self proning -Incentive spirometry and flutter valve -Follow intermittent chest x-ray and ABG as needed -Continue IV Decadron and remdesivir (day 3/5) -Will hold off on empiric CAP coverage for now as no infiltrates or opacities noted on chest x-ray, and procalcitonin 0.2 in the setting of renal failure -Trend inflammatory markers -CTA chest on 12/29 negative for PE -Vitamin C, zinc, multivitamin -Monitor fever curve -Trend WBCs and procalcitonin -Follow cultures as above Patient is medically stable for discharge from the hospital at this time however he may need negative Covid test prior to returning to his prior place of residence.  Transitions of care team is aware and is currently investigating solutions.  Septic shock in the setting of COVID-19 infection (resolved) -Continuous cardiac monitoring -Maintain MAP greater than 65 -Received IV fluid resuscitation in the ED -No indication for pressor support at this time - Hold home BP meds   AKI (improved) -Monitor I&O's / urinary output -Follow BMP -Ensure adequate renal perfusion -Avoid  nephrotoxic agents as able -Replace electrolytes as indicated -Gentle IV Fluids -wii repeat tommorow.    DVT prophylaxis: SQH Code Status: FULL Family Communication: none today Disposition Plan: SNF vs ALF   Consultants:   ICU  Procedures:  none  Antimicrobials:   remdesivir (09/04/19-  )   Subjective: Seen and examined No acute events Weaned off oxygen No pressor requirement   Objective: Vitals:   09/06/19 1200 09/06/19 1300 09/06/19 1400 09/06/19 1500  BP: (!) 142/56 (!) 151/66 (!) 155/60 (!) 135/53  Pulse: (!) 59 68  (!) 56  Resp: 18 20 15 20   Temp: 98.3 F (36.8 C)     TempSrc: Oral     SpO2: 93% 92%  94%  Weight:      Height:        Intake/Output Summary (Last 24 hours) at 09/06/2019 1551 Last data filed at 09/06/2019 1245 Gross per 24 hour  Intake 194.94 ml  Output 875 ml  Net -680.06 ml   Filed Weights   09/04/19 0039 09/04/19 0800  Weight: 81.6 kg 86.4 kg    Examination:  General exam: Appears calm and comfortable  Respiratory system: Scattered crackles.  Work of breathing not increased. Cardiovascular system: S1 & S2 heard, RRR. No JVD, murmurs, rubs, gallops or clicks. No pedal edema. Gastrointestinal system: Abdomen is nondistended, soft and nontender. No organomegaly or masses felt. Normal bowel sounds heard. Central nervous system: Alert and oriented. No focal neurological deficits. Extremities: Symmetric 5 x 5 power. Skin: No rashes, lesions or ulcers Psychiatry: Judgement and insight appear normal. Mood & affect appropriate.     Data Reviewed: I have personally reviewed following labs and imaging studies  CBC: Recent Labs  Lab  09/01/19 1519 09/04/19 0032 09/05/19 0427 09/06/19 0318  WBC 6.4 8.7 13.5* 11.2*  NEUTROABS 4.3 6.7  --  9.9*  HGB 13.8 13.8 11.9* 11.6*  HCT 41.9 40.7 36.0* 33.2*  MCV 95.7 92.5 95.5 90.5  PLT 231 221 190 0000000   Basic Metabolic Panel: Recent Labs  Lab 09/01/19 1519 09/04/19 0032 09/05/19 0427  09/06/19 0318  NA 137 138 140 141  K 3.6 3.6 3.0* 4.0  CL 96* 97* 103 107  CO2 29 27 24 25   GLUCOSE 103* 114* 132* 141*  BUN 20 36* 41* 36*  CREATININE 1.44* 2.71* 1.72* 1.30*  CALCIUM 9.2 8.6* 8.1* 8.3*  MG  --   --  2.1  --   PHOS  --   --  3.3  --    GFR: Estimated Creatinine Clearance: 49.1 mL/min (A) (by C-G formula based on SCr of 1.3 mg/dL (H)). Liver Function Tests: Recent Labs  Lab 09/01/19 1519 09/04/19 0032 09/05/19 0427 09/06/19 0318  AST 29 58* 48* 47*  ALT 31 62* 59* 65*  ALKPHOS 94 75 58 54  BILITOT 1.1 1.0 0.8 0.7  PROT 7.7 7.3 6.0* 5.9*  ALBUMIN 3.2* 3.1* 2.5* 2.6*   No results for input(s): LIPASE, AMYLASE in the last 168 hours. No results for input(s): AMMONIA in the last 168 hours. Coagulation Profile: No results for input(s): INR, PROTIME in the last 168 hours. Cardiac Enzymes: Recent Labs  Lab 09/01/19 1519  CKTOTAL 205   BNP (last 3 results) No results for input(s): PROBNP in the last 8760 hours. HbA1C: No results for input(s): HGBA1C in the last 72 hours. CBG: Recent Labs  Lab 09/04/19 0724  GLUCAP 156*   Lipid Profile: Recent Labs    09/04/19 0032  TRIG 107   Thyroid Function Tests: No results for input(s): TSH, T4TOTAL, FREET4, T3FREE, THYROIDAB in the last 72 hours. Anemia Panel: Recent Labs    09/04/19 0032 09/05/19 0427  FERRITIN 198 160   Sepsis Labs: Recent Labs  Lab 09/01/19 1519 09/04/19 0032 09/05/19 0427 09/06/19 0318  PROCALCITON <0.10 0.21 <0.10 <0.10  LATICACIDVEN 1.1 1.6  --   --     Recent Results (from the past 240 hour(s))  SARS CORONAVIRUS 2 (TAT 6-24 HRS) Nasopharyngeal Nasopharyngeal Swab     Status: Abnormal   Collection Time: 09/01/19  5:20 PM   Specimen: Nasopharyngeal Swab  Result Value Ref Range Status   SARS Coronavirus 2 POSITIVE (A) NEGATIVE Final    Comment: RESULT CALLED TO, READ BACK BY AND VERIFIED WITH: RN RAQUEL DAVID AT [place] ON  09/02/2019 (NOTE) SARS-CoV-2 target nucleic acids are DETECTED. The SARS-CoV-2 RNA is generally detectable in upper and lower respiratory specimens during the acute phase of infection. Positive results are indicative of the presence of SARS-CoV-2 RNA. Clinical correlation with patient history and other diagnostic information is  necessary to determine patient infection status. Positive results do not rule out bacterial infection or co-infection with other viruses.  The expected result is Negative. Fact Sheet for Patients: SugarRoll.be Fact Sheet for Healthcare Providers: https://www.woods-mathews.com/ This test is not yet approved or cleared by the [place] FDA and  has been authorized for detection and/or diagnosis of SARS-CoV-2 by FDA under an Emergency Use Authorization (EUA). This EUA will remain  in effect (meaning this te st can be used) for the duration of the COVID-19 declaration under Section 564(b)(1) of the Act, 21 U.S.C. section 360bbb-3(b)(1), unless the authorization is terminated or revoked  sooner. Performed at [facility], [place] [address] [place], [place] [address]   MRSA PCR Screening     Status: Abnormal   Collection Time: 09/03/19 11:59 PM   Specimen: Nasal Mucosa; Nasopharyngeal  Result Value Ref Range Status   MRSA by PCR POSITIVE (A) NEGATIVE Final    Comment:        The GeneXpert MRSA Assay (FDA approved for NASAL specimens only), is one component of a comprehensive MRSA colonization surveillance program. It is not intended to diagnose MRSA infection nor to guide or monitor treatment for MRSA infections. RESULT CALLED TO, READ BACK BY AND VERIFIED WITH: AMY SHANAHAN AT 0959 ON 09/04/2019 [place]. Performed at [facility], [address] [place], [place] [address]   Blood Culture (routine x 2)     Status: None (Preliminary result)   Collection Time: 09/04/19 12:32 AM   Specimen: BLOOD  Result  Value Ref Range Status   Specimen Description BLOOD LEFT WRIST  Final   Special Requests   Final    BOTTLES DRAWN AEROBIC AND ANAEROBIC Blood Culture adequate volume   Culture   Final    NO GROWTH 2 DAYS Performed at [facility], [address], [place], [place] [address]    Report Status PENDING  Incomplete  Blood Culture (routine x 2)     Status: None (Preliminary result)   Collection Time: 09/04/19 12:32 AM   Specimen: BLOOD  Result Value Ref Range Status   Specimen Description BLOOD RIGHT FOREARM  Final   Special Requests   Final    BOTTLES DRAWN AEROBIC AND ANAEROBIC Blood Culture results may not be optimal due to an inadequate volume of blood received in culture bottles   Culture   Final    NO GROWTH 2 DAYS Performed at [facility], [address] [place], [place] [address]    Report Status PENDING  Incomplete         Radiology Studies: No results found.      Scheduled Meds: . amiodarone  200 mg Oral QHS  . vitamin C  500 mg Oral Daily  . chlorhexidine  15 mL Mouth Rinse BID  . Chlorhexidine Gluconate Cloth  6 each Topical Daily  . clopidogrel  75 mg Oral Daily  . dexamethasone (DECADRON) injection  6 mg Intravenous Q24H  . finasteride  5 mg Oral Daily  . gabapentin  100 mg Oral TID  . heparin  5,000 Units Subcutaneous Q8H  . loratadine  10 mg Oral Daily  . mouth rinse  15 mL Mouth Rinse q12n4p  . multivitamin with minerals  1 tablet Oral Daily  . mupirocin ointment  1 application Nasal BID  . pantoprazole (PROTONIX) IV  40 mg Intravenous QHS  . senna  2 tablet Oral Daily  . simvastatin  10 mg Oral QHS  . tamsulosin  0.4 mg Oral Daily  . zinc sulfate  220 mg Oral Daily   Continuous Infusions: . norepinephrine (LEVOPHED) Adult infusion Stopped (09/05/19 0656)  . remdesivir 100 mg in NS 100 mL Stopped (09/06/19 0904)     LOS: 2 days    Time spent: 35 minutes    Sidney Ace, MD [facility] Pager [phone]  [phone]  If 7PM-7AM, please contact night-coverage [email or web address] Password [facility] 09/06/2019, 3:51 PM

## 2019-09-06 NOTE — TOC Initial Note (Signed)
Transition of Care (TOC) - Initial/Assessment Note    Patient Details  Name: Joel Wilson. MRN: [id number] Date of Birth: Jan 25, 1941  Transition of Care [facility]) CM/SW Contact:    Joel Rogers, LCSW Phone Number: 09/06/2019, 2:22 PM  Clinical Narrative:                  CSW contacted pt's daughter, Joel Wilson, and pt's brother, Joel Wilson, on a conference call. Patient's adult children stated that the patient came to the hospital from [facility] due to the patient going there because of being COVID positive. Patient originally is from [facility] where he is a resident there. Patient cannot go back to [facility] without a negative covid test and an evaluation from [facility]. Pt's adult children want the patient to be able to go back to [facility].  CSW contacted [facility] who stated that the patient can go back to [facility] per the director, Joel Wilson. CSW informed that patient will not be discharging today but will once [facility] comes for their evaluation and will keep [facility] in contact.  CSW contacted [facility] to see if the patient can just come right back there instead of [facility]. [place] stated that they will send someone out at 9:30 on 1/4 to do an evaluation to see if the patient can come back.  CSW informed pt's doctor who stated that patient will most likely test positive but will do a test once [facility] comes for their evulation on 09/07/2019.   CSW informed pt's adult children of the updated information.   Awaiting disposition from [facility].   Expected Discharge Plan: Assisted Living Barriers to Discharge: Other (comment)(SNF pending negative covid test and review for [facility])   Patient Goals and CMS Choice Patient states their goals for this hospitalization and ongoing recovery are:: Pt's son, "return back to [employer]" CMS Medicare.gov Compare Post Acute Care list  provided to:: Patient Represenative (must comment)(pt's son, Joel Wilson) Choice offered to / list presented to : Adult Children, Patient  Expected Discharge Plan and Services Expected Discharge Plan: Assisted Living In-house Referral: Clinical Social Work     Living arrangements for the past 2 months: [place] Expected Discharge Date: 09/06/19                                    Prior Living Arrangements/Services Living arrangements for the past 2 months: [place] Lives with:: Self Patient language and need for interpreter reviewed:: Yes Do you feel safe going back to the place where you live?: Yes      Need for Family Participation in Patient Care: Yes (Comment)(pt's son) Care giver support system in place?: Yes (comment)(pt's son)   Criminal Activity/Legal Involvement Pertinent to Current Situation/Hospitalization: No - Comment as needed  Activities of Daily Living Home Assistive Devices/Equipment: Wheelchair ADL Screening (condition at time of admission) Patient's cognitive ability adequate to safely complete daily activities?: Yes Is the patient deaf or have difficulty hearing?: No Does the patient have difficulty seeing, even when wearing glasses/contacts?: No Does the patient have difficulty concentrating, remembering, or making decisions?: No Patient able to express need for assistance with ADLs?: Yes Does the patient have difficulty dressing or bathing?: Yes Independently performs ADLs?: No Does the patient have difficulty walking or climbing stairs?: Yes Weakness of Legs: Both Weakness of  Arms/Hands: None  Permission Sought/Granted Permission sought to share information with : Facility Sport and exercise psychologist                Emotional Assessment Appearance:: Appears stated age Attitude/Demeanor/Rapport: Unable to Assess Affect (typically observed): Unable to Assess Orientation: : Oriented to Place, Oriented to  Time, Oriented  to Situation Alcohol / Substance Use: Not Applicable Psych Involvement: No (comment)  Admission diagnosis:  Acute hypoxemic respiratory failure due to COVID-19 ([place]) [U07.1, J96.01] COVID-19 virus infection [U07.1] COVID-19 [U07.1] Patient Active Problem List   Diagnosis Date Noted  . COVID-19 09/04/2019  . Acute hypoxemic respiratory failure due to COVID-19 ([place]) 09/04/2019  . Lobar pneumonia ([place]) 08/16/2019  . SIRS (systemic inflammatory response syndrome) ([place]) 08/16/2019  . Pressure injury of skin 08/12/2019  . Fall 08/11/2019  . Acute respiratory failure with hypoxia ([place]) 08/11/2019  . Generalized weakness 08/11/2019  . Liver lesion 08/11/2019  . Acute renal failure superimposed on stage 3a chronic kidney disease ([place]) 08/11/2019  . HTN (hypertension) 08/11/2019  . Seasonal allergic rhinitis 01/03/2017  . Chronic pain syndrome 05/16/2016  . Chronic lumbar pain 03/23/2016  . Abnormality of gait 12/09/2015  . Insomnia 12/09/2015  . Vitamin D deficiency 11/08/2015  . Peripheral neuropathy 11/08/2015  . BPH without obstruction/lower urinary tract symptoms 11/08/2015  . Hypertensive heart disease   . Paroxysmal atrial fibrillation (HCC)   . Long term (current) use of anticoagulants 09/07/2013  . Atrial fibrillation ([place]) 09/04/2013  . Coronary artery disease   . Hyperlipidemia    PCP:  [facility]:   [place] [place]. [place] [address] Phone: [phone] Fax: [phone]     Social Determinants of Health (SDOH) Interventions    Readmission Risk Interventions Readmission Risk Prevention Plan 09/06/2019  Transportation Screening Complete  HRI or Home Care Consult Patient refused  Social Work Consult for [place] Planning/Counseling Complete  Palliative Care Screening Not Applicable  Medication Review Press photographer) Complete  Some recent data might be hidden

## 2019-09-06 NOTE — Progress Notes (Signed)
Initial Nutrition Assessment  RD working remotely.  DOCUMENTATION CODES:   Not applicable  INTERVENTION:  Provide Ensure Enlive po TID, each supplement provides 350 kcal and 20 grams of protein.  Continue daily MVI.  NUTRITION DIAGNOSIS:   Increased nutrient needs related to wound healing, catabolic [id number]) as evidenced by estimated needs.  GOAL:   Patient will meet greater than or equal to 90% of their needs  MONITOR:   PO intake, Supplement acceptance, Labs, Weight trends, Skin, I & O's  REASON FOR ASSESSMENT:   Malnutrition Screening Tool    ASSESSMENT:   79 year old male with PMHx of CAD, HTN, HLD, paroxysmal A-fib, vitamin D deficiency admitted with COVID-19 infection.   Patient is on regular diet. Only meal completion documented was 5% of breakfast on 1/2. Patient has increased calorie/protein needs in setting of catabolic nature of [id number]. He would benefit from oral nutrition supplement. Discharge order was placed today but per chart discharge will not actually occur today.  Weight appears stable per chart. Patient is currently 86.4 kg (190.48 lbs).  Medications reviewed and include: vitamin C 500 mg daily, Decadron 6 mg Q24hrs IV, MVI daily, pantoprazole, senna 2 tablets daily, zinc sulfate 220 mg daily, remdesivir.  Labs reviewed: CBG 156, BUN 36, Creatinine 1.3.  Unable to determine if patient meets criteria for malnutrition at this time.  NUTRITION - FOCUSED PHYSICAL EXAM:  Unable to complete.  Diet Order:   Diet Order            Diet - low sodium heart healthy        Diet regular Room service appropriate? Yes; Fluid consistency: Thin  Diet effective now             EDUCATION NEEDS:   No education needs have been identified at this time  Skin:  Skin Assessment: Skin Integrity Issues:(stg III bilateral buttocks; unstageable right heel)  Last BM:  1/3 medium type 6  Height:   Ht Readings from Last 1 Encounters:  09/04/19 5\' 11"   (1.803 m)   Weight:  Wt Readings from Last 1 Encounters:  09/04/19 86.4 kg   Ideal Body Weight:  78.2 kg  BMI:  Body mass index is 26.57 kg/m.  Estimated Nutritional Needs:   Kcal:  2200-2500  Protein:  110-125 grams  Fluid:  2.2 L/day  Jacklynn Barnacle, MS, RD, LDN Office: [phone] Pager: [phone] After Hours/Weekend Pager: [phone]

## 2019-09-06 NOTE — Discharge Summary (Signed)
Physician Discharge Summary  Joel Wilson. MA:[id number] DOB: May 24, 1941 DOA: 09/03/2019  PCP: [facility]  Admit date: 09/03/2019 Discharge date: 09/08/2019  Admitted From: SNF Disposition:  SNF  Recommendations for Outpatient Follow-up:  1. Follow up with PCP in 1-2 weeks 2.   Home Health:NO Equipment/Devices:None  Discharge Condition:Stable CODE STATUS:FULL Diet recommendation: Heart Healthy  Brief/Interim Summary: [name] is a 79 year old male with a past medical history significant for hypertension, hyperlipidemia, paroxysmal atrial fibrillation, vitamin D deficiency, CAD, BPH who presents to [facility] ED on 09/04/2019 due to , generalized malaise, shortness of breath and worsening hypoxia.  He was recently diagnosed with COVID-19 infection on 09/01/2019.  He was placed on 3 L supplemental O2.  Initial work-up in the ED revealed glucose 114, BUN 36, creatinine 2.71, AST 58, ALT 62, LDH 222, ferritin 198, CRP 4.5, lactic acid 1.6, procalcitonin 0.21, WBC 8.7, fibrinogen 591, FDPs 1090.  Chest x-ray is concerning for mild diffuse pulmonary interstitial congestion without frank pulmonary edema, otherwise no active cardiopulmonary disease.  He did exhibit progressive dyspnea and hypoxia while sleeping, of which O2 sats fell to 88%.  He was subsequently placed on high flow nasal cannula, which he is tolerating with improved oxygenation.  He was to be admitted to the [facility] unit by the hospitalist, however while awaiting bed placement, he became hypotensive that was poorly responsive to 2 L IV fluid boluses.  He subsequently required Levophed infusion.  PCCM is asked to admit the patient to ICU for acute hypoxic respiratory failure and septic shock secondary to COVID-19 infection.  Discharge Diagnoses:  Active Problems:   COVID-19   Acute hypoxemic respiratory failure due to COVID-19 [facility])   Acute hypoxic respiratory failure secondary to COVID-19  infection -Supplemental O2 as needed -  -Respiratory status improved -On room air at time of discharge -Low suspicion for bacterial coinfection -No indication for antibiotics at this time -Received 5 doses of remdesivir in house -We will continue steroids for additional 7 days to compete 10 day course  Septic shock in the setting of COVID-19 infection (resolved) Fluid resuscitated in the emergency department with limited results Admitted to intensive care unit for septic shock Required on small dose of Levophed, maximum 4 mcg Titrated off quickly Blood pressure stable off pressors Can resume home regimen on discharge  AKI Resolved after intravenous fluids Outpatient PCP follow-up  Discharge Instructions  Discharge Instructions    MyChart COVID-19 home monitoring program   Complete by: Sep 06, 2019    Is the patient willing to use the MyChart Mobile App for home monitoring?: Yes   Temperature monitoring   Complete by: Sep 06, 2019    After how many days would you like to receive a notification of this patient's flowsheet entries?: 1   Diet - low sodium heart healthy   Complete by: As directed    Diet - low sodium heart healthy   Complete by: As directed    Increase activity slowly   Complete by: As directed    Increase activity slowly   Complete by: As directed      Allergies as of 09/08/2019      Reactions   Aspirin       Medication List    TAKE these medications   acetaminophen 325 MG tablet Commonly known as: TYLENOL Take 650 mg by mouth every 6 (six) hours as needed for mild pain or fever.   amiodarone 200 MG tablet Commonly known as: PACERONE Take 1  tablet (200 mg total) by mouth at bedtime.   Calcium 600+D 600-400 MG-UNIT tablet Generic drug: Calcium Carbonate-Vitamin D Take 1 tablet by mouth daily.   Calmoseptine 0.44-20.6 % Oint Generic drug: Menthol-Zinc Oxide Apply 1 application topically daily. (apply to buttocks)   cetirizine 10 MG  tablet Commonly known as: ZYRTEC Take 10 mg by mouth daily.   cholecalciferol 1000 units tablet Commonly known as: VITAMIN D Take 2,000 Units by mouth daily.   clopidogrel 75 MG tablet Commonly known as: PLAVIX Take 75 mg by mouth daily.   Dermacloud Crea Apply 1 application topically 2 (two) times daily. (apply to buttocks and sacral area)   dexamethasone 6 MG tablet Commonly known as: Decadron Take 1 tablet (6 mg total) by mouth daily for 7 days.   diltiazem 240 MG 24 hr capsule Commonly known as: DILACOR XR Take 240 mg by mouth daily.   finasteride 5 MG tablet Commonly known as: PROSCAR Take 5 mg by mouth daily.   furosemide 20 MG tablet Commonly known as: LASIX Take 20 mg by mouth daily.   gabapentin 600 MG tablet Commonly known as: NEURONTIN Take 1,200 mg by mouth 3 (three) times daily.   Lidocaine 4 % Ptch Place 1 patch onto the skin daily. (apply to right shoulder)   lisinopril 10 MG tablet Commonly known as: ZESTRIL Take 10 mg by mouth daily. (hold if SBP<110)   loperamide 2 MG capsule Commonly known as: IMODIUM Take 2 mg by mouth daily as needed for diarrhea or loose stools.   Melatonin 3 MG Tabs Take 1 tablet (3 mg total) by mouth at bedtime.   metoprolol succinate 25 MG 24 hr tablet Commonly known as: TOPROL-XL Take 12.5 mg by mouth 2 (two) times daily.   Minerin Creme Crea Apply 1 application topically daily. (apply to legs and knees)   Mintox [id number] MG/5ML suspension Generic drug: alum & mag hydroxide-simeth Take 30 mLs by mouth every 6 (six) hours as needed for indigestion or heartburn.   neomycin-bacitracin-polymyxin [phone] ointment Apply 1 application topically daily as needed (tears or abrasions).   nitroGLYCERIN 0.4 MG SL tablet Commonly known as: NITROSTAT Place 0.4 mg under the tongue every 5 (five) minutes as needed for chest pain.   nystatin powder Commonly known as: MYCOSTATIN/NYSTOP Apply 1 application topically 4  (four) times daily.   ondansetron 4 MG tablet Commonly known as: ZOFRAN Take 4 mg by mouth every 6 (six) hours as needed for nausea or vomiting.   Oxycodone HCl 20 MG Tabs Take 0.5 tablets (10 mg total) by mouth 3 (three) times daily.   pantoprazole 40 MG tablet Commonly known as: PROTONIX Take 1 tablet (40 mg total) by mouth daily.   polyethylene glycol 17 g packet Commonly known as: MIRALAX / GLYCOLAX Take 17 g by mouth daily as needed for mild constipation.   senna 8.6 MG tablet Commonly known as: SENOKOT Take 2 tablets by mouth daily.   simvastatin 10 MG tablet Commonly known as: ZOCOR Take 10 mg by mouth at bedtime.   tamsulosin 0.4 MG Caps capsule Commonly known as: FLOMAX Take 0.4 mg by mouth daily.   triamcinolone ointment 0.1 % Commonly known as: KENALOG Apply 1 application topically 2 (two) times daily. (apply to feet, lower legs and ankles)   vitamin B-12 1000 MCG tablet Commonly known as: CYANOCOBALAMIN Take 1,000 mcg by mouth daily.       Allergies  Allergen Reactions  . Aspirin     Consultations:  ICU  Procedures/Studies: CT Head Wo Contrast  Result Date: 09/01/2019 CLINICAL DATA:  Tremors for 1 week. EXAM: CT HEAD WITHOUT CONTRAST TECHNIQUE: Contiguous axial images were obtained from the base of the skull through the vertex without intravenous contrast. COMPARISON:  None. FINDINGS: Brain: Stable mild age related cerebral atrophy, ventriculomegaly and periventricular white matter disease. No extra-axial fluid collections are identified. No CT findings for acute hemispheric infarction or intracranial hemorrhage. No mass lesions. The brainstem and cerebellum are normal. Vascular: Mild vascular calcifications. No aneurysm or hyperdense vessels. Skull: No skull fracture or bone lesions. Sinuses/Orbits: The paranasal sinuses and mastoid air cells are clear. The globes are intact. Other: No scalp lesions or hematoma. IMPRESSION: No acute intracranial  findings or mass lesions. Electronically Signed   By: Marijo Sanes M.D.   On: 09/01/2019 17:05   CT Angio Chest PE W and/or Wo Contrast  Result Date: 09/01/2019 CLINICAL DATA:  Short of breath. EXAM: CT ANGIOGRAPHY CHEST WITH CONTRAST TECHNIQUE: Multidetector CT imaging of the chest was performed using the standard protocol during bolus administration of intravenous contrast. Multiplanar CT image reconstructions and MIPs were obtained to evaluate the vascular anatomy. CONTRAST:  32mL OMNIPAQUE IOHEXOL 350 MG/ML SOLN COMPARISON:  CT 08/11/2019 FINDINGS: Cardiovascular: No filling defects within the pulmonary arteries to suggest acute pulmonary embolism. Mediastinum/Nodes: No axillary supraclavicular adenopathy. No mediastinal hilar adenopathy. Lungs/Pleura: Bibasilar atelectasis. No pulmonary infarction. No pneumonia evident. Mild ground-glass opacities in the upper lobes in a mosaic pattern. Upper Abdomen: Distension of the gallbladder to 6 cm increased from 5.4 cm on comparison exam. Small amount sludge within the gallbladder. No biliary duct dilatation. Adrenals normal. Musculoskeletal: No aggressive osseous lesion Review of the MIP images confirms the above findings. IMPRESSION: 1. No evidence acute pulmonary embolism. 2. Bilateral atelectasis. 3. Ground-glass opacity upper lobes favored mild edema. Pneumonia not favored. 4. Distension of  the gallbladder increased from prior. Electronically Signed   By: Suzy Bouchard M.D.   On: 09/01/2019 17:12   DG Chest Port 1 View  Result Date: 09/04/2019 CLINICAL DATA:  Initial evaluation for acute respiratory distress. EXAM: PORTABLE CHEST 1 VIEW COMPARISON:  Prior radiograph from 09/01/2019. FINDINGS: Mild cardiomegaly, stable. Mediastinal silhouette within normal limits. Lungs hypoinflated. Mild diffuse pulmonary interstitial congestion without frank alveolar edema. No pleural effusion. No focal infiltrates or consolidative opacity. No pneumothorax. No acute  osseous finding. Degenerative changes noted about the left shoulder. IMPRESSION: 1. Mild diffuse pulmonary interstitial congestion without frank pulmonary edema. 2. No other active cardiopulmonary disease. Electronically Signed   By: Jeannine Boga M.D.   On: 09/04/2019 00:40   DG Chest Port 1 View  Result Date: 09/01/2019 CLINICAL DATA:  Tremors and history of recent COVID-19 exposure EXAM: PORTABLE CHEST 1 VIEW COMPARISON:  08/12/2019 FINDINGS: Cardiac shadow is within normal limits. The lungs are hypoinflated but clear. Previously seen congestive failure has resolved in the interval. No focal infiltrate is seen. No bony abnormality is noted. IMPRESSION: Resolution of previously seen vascular congestion. No focal confluent infiltrate is seen. Electronically Signed   By: Inez Catalina M.D.   On: 09/01/2019 15:46   DG Chest Port 1 View  Result Date: 08/12/2019 CLINICAL DATA:  Shortness of breath. Heart disease. Atrial fibrillation. Ex-smoker. EXAM: PORTABLE CHEST 1 VIEW COMPARISON:  08/10/2019 FINDINGS: Midline trachea. Borderline cardiomegaly. Atherosclerosis in the transverse aorta. No pleural effusion or pneumothorax. Development of relatively diffuse interstitial prominence with perihilar airspace and interstitial opacities. IMPRESSION: Interstitial and airspace disease bilaterally. Favor pulmonary edema. Especially given  lack of pleural fluid, atypical infection is a secondary consideration. Electronically Signed   By: Abigail Miyamoto M.D.   On: 08/12/2019 15:49   DG Chest Portable 1 View  Result Date: 08/10/2019 CLINICAL DATA:  The chest pain. Progressive weakness. EXAM: PORTABLE CHEST 1 VIEW COMPARISON:  Chest radiograph 10/10/2015 FINDINGS: The cardiomediastinal contours are unchanged. Streaky opacities in the left mid lower lung zone, present on prior exam but increased. Pulmonary vasculature is normal. No confluent consolidation, pleural effusion, or pneumothorax. No acute osseous  abnormalities are seen. IMPRESSION: Streaky opacities in the left mid lower lung zone, present on 2017 exam but increased from. Findings likely represent atelectasis or atypical pneumonia superimposed on scarring. Electronically Signed   By: Keith Rake M.D.   On: 08/10/2019 23:16   CT Angio Chest/Abd/Pel for Dissection W and/or Wo Contrast  Result Date: 08/11/2019 CLINICAL DATA:  Acute chest and back pain, aortic dissection suspected. Patient reports generalized weakness. EXAM: CT ANGIOGRAPHY CHEST, ABDOMEN AND PELVIS TECHNIQUE: Multidetector CT imaging through the chest, abdomen and pelvis was performed using the standard protocol during bolus administration of intravenous contrast. Multiplanar reconstructed images and MIPs were obtained and reviewed to evaluate the vascular anatomy. CONTRAST:  24mL OMNIPAQUE IOHEXOL 350 MG/ML SOLN COMPARISON:  Radiograph yesterday. FINDINGS: CTA CHEST FINDINGS Cardiovascular: Aortic atherosclerosis and tortuosity. No aortic dissection, hematoma, or evidence of acute aortic syndrome. Left vertebral artery arises directly from the thoracic aorta, variant arch anatomy. Mild multi chamber cardiomegaly. There are coronary artery calcifications, possible coronary stents. Mitral annulus calcifications. Limited assessment for pulmonary embolus given phase of contrast, no filling defects in the pulmonary arteries to the lobar level. Mediastinum/Nodes: No enlarged mediastinal or hilar lymph nodes. No esophageal wall thickening. No suspicious thyroid nodule. Lungs/Pleura: Linear atelectasis in both lower lobes and the lingula. Mild hypoventilatory atelectasis dependently. Trace pleural thickening without frank pleural effusion. No pulmonary edema or septal thickening. No pulmonary mass. Trachea and mainstem bronchi are patent. Musculoskeletal: There are no acute or suspicious osseous abnormalities. Degenerative change in the thoracic spine. No compression deformity Review of the  MIP images confirms the above findings. CTA ABDOMEN AND PELVIS FINDINGS VASCULAR Aorta: Normal in caliber with moderate atherosclerosis. No aneurysm, dissection, or evidence of vasculitis. Celiac: Patent with mild plaque at the origin. No dissection or aneurysm. Branch vessels are patent. SMA: Patent with mild plaque at the origin. Mesenteric branches are patent. Renals: Single right renal artery. Three left renal arteries, 2 codominant and a tiny accessory to the upper pole. All renal arteries are patent with mild atheromatous plaque. No dissection or evidence of FMD. IMA: Patent without evidence of aneurysm, dissection, vasculitis or significant stenosis. Inflow: Patent without evidence of aneurysm, dissection, vasculitis or significant stenosis. Veins: No obvious venous abnormality within the limitations of this arterial phase study. Review of the MIP images confirms the above findings. NON-VASCULAR Hepatobiliary: 9 mm hypodensity in the left lobe, nonspecific and too small to accurately characterize. Calcified granuloma in the dome of the liver. Distended gallbladder. Possible small layering stones or sludge, series 5, image 80. Mild common bile duct dilatation at 10 mm. No visualized choledocholithiasis. Pancreas: Unremarkable. No pancreatic ductal dilatation or surrounding inflammatory changes. Spleen: Normal in size. Normal arterial enhancement. Adrenals/Urinary Tract: Normal adrenal glands. No hydronephrosis. Trace bilateral symmetric perinephric edema. Urinary bladder is unremarkable. Stomach/Bowel: Decompressed stomach. No small bowel obstruction or inflammatory change. Normal appendix. Moderate volume of colonic stool. No colonic inflammation. Diverticulosis of the distal descending and sigmoid colon. No evidence  of diverticulitis. There is sigmoid colonic tortuosity. Lymphatic: No adenopathy. Reproductive: Prostate is unremarkable. Other: No free air, free fluid, or intra-abdominal fluid collection.  Musculoskeletal: Multilevel degenerative change throughout the lumbar spine. There are no acute or suspicious osseous abnormalities. Chronic irregularity of the right anterior iliac crest. Chronic calcifications in the anterior left upper thigh musculature, partially included, likely sequela of remote prior injury. Review of the MIP images confirms the above findings. IMPRESSION: 1. Thoracoabdominal aortic atherosclerosis. No aneurysm, dissection, or acute aortic abnormality. Coronary artery calcifications versus stents. 2. Distended gallbladder with possible small layering stones or sludge. Mild common bile duct dilatation at 10 mm, no visualized choledocholithiasis. Recommend correlation with LFTs. Consider further evaluation with right upper quadrant ultrasound. 3. Distal colonic diverticulosis without diverticulitis. 4. Streaky atelectasis in both lower lobes and lingula. Aortic Atherosclerosis (ICD10-I70.0). Electronically Signed   By: Keith Rake M.D.   On: 08/11/2019 01:02   US Abdomen Limited RUQ  Result Date: 08/11/2019 CLINICAL DATA:  Gallstones on CT.  Chest pain. EXAM: ULTRASOUND ABDOMEN LIMITED RIGHT UPPER QUADRANT COMPARISON:  None. FINDINGS: Gallbladder: Distended. No gallstones or sludge. No wall thickening. No sonographic Murphy sign noted by sonographer. Common bile duct: Diameter: 3 mm proximally, flares distally to 7 mm. Liver: Hypoechoic lesion in the left lobe of the liver measures 11 x 8 x 9 mm, corresponding to a hypodense lesion on CT, not well characterized sonographically. No other focal lesion. Within normal limits in parenchymal echogenicity. Portal vein is patent on color Doppler imaging with normal direction of blood flow towards the liver. Other: None. IMPRESSION: 1. Distended gallbladder but no gallstones, sludge, or gallbladder wall thickening. 2. Small 11 mm hypoechoic lesion in the left lobe of the liver, not well characterized sonographically but likely cyst.  Electronically Signed   By: Keith Rake M.D.   On: 08/11/2019 06:34     Subjective:   Discharge Exam: Vitals:   09/08/19 0400 09/08/19 0500  BP:    Pulse:    Resp: 16 15  Temp:    SpO2:     Vitals:   09/08/19 0214 09/08/19 0300 09/08/19 0400 09/08/19 0500  BP:      Pulse:      Resp:  19 16 15   Temp:      TempSrc:      SpO2:      Weight: 85.7 kg     Height:        General: Pt is alert, awake, not in acute distress Cardiovascular: RRR, S1/S2 +, no rubs, no gallops Respiratory: CTA bilaterally, no wheezing, no rhonchi Abdominal: Soft, NT, ND, bowel sounds + Extremities: no edema, no cyanosis    The results of significant diagnostics from this hospitalization (including imaging, microbiology, ancillary and laboratory) are listed below for reference.     Microbiology: Recent Results (from the past 240 hour(s))  SARS CORONAVIRUS 2 (TAT 6-24 HRS) Nasopharyngeal Nasopharyngeal Swab     Status: Abnormal   Collection Time: 09/01/19  5:20 PM   Specimen: Nasopharyngeal Swab  Result Value Ref Range Status   SARS Coronavirus 2 POSITIVE (A) NEGATIVE Final    Comment: RESULT CALLED TO, READ BACK BY AND VERIFIED WITH: RN RAQUEL DAVID AT [place] ON 09/02/2019 (NOTE) SARS-CoV-2 target nucleic acids are DETECTED. The SARS-CoV-2 RNA is generally detectable in upper and lower respiratory specimens during the acute phase of infection. Positive results are indicative of the presence of SARS-CoV-2 RNA. Clinical correlation with patient history and other diagnostic  information is  necessary to determine patient infection status. Positive results do not rule out bacterial infection or co-infection with other viruses.  The expected result is Negative. Fact Sheet for Patients: SugarRoll.be Fact Sheet for Healthcare Providers: https://www.woods-mathews.com/ This test is not yet approved or cleared by the [place] FDA  and  has been authorized for detection and/or diagnosis of SARS-CoV-2 by FDA under an Emergency Use Authorization (EUA). This EUA will remain  in effect (meaning this te st can be used) for the duration of the COVID-19 declaration under Section 564(b)(1) of the Act, 21 U.S.C. section 360bbb-3(b)(1), unless the authorization is terminated or revoked sooner. Performed at [facility], [place] [address] [place], [place] [address]   MRSA PCR Screening     Status: Abnormal   Collection Time: 09/03/19 11:59 PM   Specimen: Nasal Mucosa; Nasopharyngeal  Result Value Ref Range Status   MRSA by PCR POSITIVE (A) NEGATIVE Final    Comment:        The GeneXpert MRSA Assay (FDA approved for NASAL specimens only), is one component of a comprehensive MRSA colonization surveillance program. It is not intended to diagnose MRSA infection nor to guide or monitor treatment for MRSA infections. RESULT CALLED TO, READ BACK BY AND VERIFIED WITH: AMY SHANAHAN AT 0959 ON 09/04/2019 [place]. Performed at [facility], [address] [place], [place] [address]   Blood Culture (routine x 2)     Status: None (Preliminary result)   Collection Time: 09/04/19 12:32 AM   Specimen: BLOOD  Result Value Ref Range Status   Specimen Description BLOOD LEFT WRIST  Final   Special Requests   Final    BOTTLES DRAWN AEROBIC AND ANAEROBIC Blood Culture adequate volume   Culture   Final    NO GROWTH 4 DAYS Performed at [facility], [address], [place], [place] [address]    Report Status PENDING  Incomplete  Blood Culture (routine x 2)     Status: None (Preliminary result)   Collection Time: 09/04/19 12:32 AM   Specimen: BLOOD  Result Value Ref Range Status   Specimen Description BLOOD RIGHT FOREARM  Final   Special Requests   Final    BOTTLES DRAWN AEROBIC AND ANAEROBIC Blood Culture results may not be optimal due to an inadequate volume of blood received in culture bottles   Culture    Final    NO GROWTH 4 DAYS Performed at [facility], [address] [place], [place] [address]    Report Status PENDING  Incomplete  SARS CORONAVIRUS 2 (TAT 6-24 HRS) Nasopharyngeal Nasopharyngeal Swab     Status: Abnormal   Collection Time: 09/07/19  8:40 AM   Specimen: Nasopharyngeal Swab  Result Value Ref Range Status   SARS Coronavirus 2 POSITIVE (A) NEGATIVE Final    Comment: RESULT CALLED TO, READ BACK BY AND VERIFIED WITH: S.ELLIOT RN [id number] 09/07/19 [place] K (NOTE) SARS-CoV-2 target nucleic acids are DETECTED. The SARS-CoV-2 RNA is generally detectable in upper and lower respiratory specimens during the acute phase of infection. Positive results are indicative of the presence of SARS-CoV-2 RNA. Clinical correlation with patient history and other diagnostic information is  necessary to determine patient infection status. Positive results do not rule out bacterial infection or co-infection with other viruses.  The expected result is Negative. Fact Sheet for Patients: SugarRoll.be Fact Sheet for Healthcare Providers: https://www.woods-mathews.com/ This test is not yet approved or cleared by the [place] FDA and  has  been authorized for detection and/or diagnosis of SARS-CoV-2 by FDA under an Emergency Use Authorization (EUA). This EUA will remain  in effect (meaning this test can be used) for t he duration of the COVID-19 declaration under Section 564(b)(1) of the Act, 21 U.S.C. section 360bbb-3(b)(1), unless the authorization is terminated or revoked sooner. Performed at [facility], [place] [address], [place], [place] [address]      Labs: BNP (last 3 results) Recent Labs    08/11/19 1209 09/04/19 0032  BNP 202.0* 0000000   Basic Metabolic Panel: Recent Labs  Lab 09/01/19 1519 09/04/19 0032 09/05/19 0427 09/06/19 0318 09/07/19 0512  NA 137 138 140 141 138  K 3.6 3.6 3.0* 4.0 4.0  CL 96* 97* 103 107  105  CO2 29 27 24 25 23   GLUCOSE 103* 114* 132* 141* 103*  BUN 20 36* 41* 36* 33*  CREATININE 1.44* 2.71* 1.72* 1.30* 1.10  CALCIUM 9.2 8.6* 8.1* 8.3* 8.4*  MG  --   --  2.1  --   --   PHOS  --   --  3.3  --   --    Liver Function Tests: Recent Labs  Lab 09/01/19 1519 09/04/19 0032 09/05/19 0427 09/06/19 0318  AST 29 58* 48* 47*  ALT 31 62* 59* 65*  ALKPHOS 94 75 58 54  BILITOT 1.1 1.0 0.8 0.7  PROT 7.7 7.3 6.0* 5.9*  ALBUMIN 3.2* 3.1* 2.5* 2.6*   No results for input(s): LIPASE, AMYLASE in the last 168 hours. No results for input(s): AMMONIA in the last 168 hours. CBC: Recent Labs  Lab 09/01/19 1519 09/04/19 0032 09/05/19 0427 09/06/19 0318 09/07/19 0512  WBC 6.4 8.7 13.5* 11.2* 8.9  NEUTROABS 4.3 6.7  --  9.9* 7.2  HGB 13.8 13.8 11.9* 11.6* 12.2*  HCT 41.9 40.7 36.0* 33.2* 36.6*  MCV 95.7 92.5 95.5 90.5 92.7  PLT 231 221 190 158 149*   Cardiac Enzymes: Recent Labs  Lab 09/01/19 1519  CKTOTAL 205   BNP: Invalid input(s): POCBNP CBG: Recent Labs  Lab 09/04/19 0724 09/04/19 0807  GLUCAP 156* 138*   D-Dimer No results for input(s): DDIMER in the last 72 hours. Hgb A1c No results for input(s): HGBA1C in the last 72 hours. Lipid Profile No results for input(s): CHOL, HDL, LDLCALC, TRIG, CHOLHDL, LDLDIRECT in the last 72 hours. Thyroid function studies No results for input(s): TSH, T4TOTAL, T3FREE, THYROIDAB in the last 72 hours.  Invalid input(s): FREET3 Anemia work up No results for input(s): VITAMINB12, FOLATE, FERRITIN, TIBC, IRON, RETICCTPCT in the last 72 hours. Urinalysis    Component Value Date/Time   COLORURINE YELLOW (A) 09/01/2019 1519   APPEARANCEUR CLEAR (A) 09/01/2019 1519   APPEARANCEUR Clear 08/29/2013 0454   LABSPEC 1.011 09/01/2019 1519   LABSPEC 1.019 08/29/2013 0454   PHURINE 7.0 09/01/2019 1519   GLUCOSEU NEGATIVE 09/01/2019 1519   GLUCOSEU Negative 08/29/2013 0454   HGBUR SMALL (A) 09/01/2019 1519   BILIRUBINUR NEGATIVE  09/01/2019 1519   BILIRUBINUR Negative 08/29/2013 0454   KETONESUR NEGATIVE 09/01/2019 1519   PROTEINUR NEGATIVE 09/01/2019 1519   NITRITE NEGATIVE 09/01/2019 1519   LEUKOCYTESUR NEGATIVE 09/01/2019 1519   LEUKOCYTESUR Negative 08/29/2013 0454   Sepsis Labs Invalid input(s): PROCALCITONIN,  WBC,  LACTICIDVEN Microbiology Recent Results (from the past 240 hour(s))  SARS CORONAVIRUS 2 (TAT 6-24 HRS) Nasopharyngeal Nasopharyngeal Swab     Status: Abnormal   Collection Time: 09/01/19  5:20 PM   Specimen: Nasopharyngeal Swab  Result Value Ref Range Status   SARS Coronavirus 2 POSITIVE (A) NEGATIVE Final    Comment: RESULT CALLED TO, READ BACK BY AND VERIFIED WITH: RN RAQUEL DAVID AT [place] ON 09/02/2019 (NOTE) SARS-CoV-2 target nucleic acids are DETECTED. The SARS-CoV-2 RNA is generally detectable in upper and lower respiratory specimens during the acute phase of infection. Positive results are indicative of the presence of SARS-CoV-2 RNA. Clinical correlation with patient history and other diagnostic information is  necessary to determine patient infection status. Positive results do not rule out bacterial infection or co-infection with other viruses.  The expected result is Negative. Fact Sheet for Patients: SugarRoll.be Fact Sheet for Healthcare Providers: https://www.woods-mathews.com/ This test is not yet approved or cleared by the [place] FDA and  has been authorized for detection and/or diagnosis of SARS-CoV-2 by FDA under an Emergency Use Authorization (EUA). This EUA will remain  in effect (meaning this te st can be used) for the duration of the COVID-19 declaration under Section 564(b)(1) of the Act, 21 U.S.C. section 360bbb-3(b)(1), unless the authorization is terminated or revoked sooner. Performed at [facility], [place] [address], [place], [place] [address]   MRSA PCR Screening     Status: Abnormal    Collection Time: 09/03/19 11:59 PM   Specimen: Nasal Mucosa; Nasopharyngeal  Result Value Ref Range Status   MRSA by PCR POSITIVE (A) NEGATIVE Final    Comment:        The GeneXpert MRSA Assay (FDA approved for NASAL specimens only), is one component of a comprehensive MRSA colonization surveillance program. It is not intended to diagnose MRSA infection nor to guide or monitor treatment for MRSA infections. RESULT CALLED TO, READ BACK BY AND VERIFIED WITH: AMY SHANAHAN AT 0959 ON 09/04/2019 [place]. Performed at [facility], [address] [place], [place] [address]   Blood Culture (routine x 2)     Status: None (Preliminary result)   Collection Time: 09/04/19 12:32 AM   Specimen: BLOOD  Result Value Ref Range Status   Specimen Description BLOOD LEFT WRIST  Final   Special Requests   Final    BOTTLES DRAWN AEROBIC AND ANAEROBIC Blood Culture adequate volume   Culture   Final    NO GROWTH 4 DAYS Performed at [facility], [address], [place], [place] [address]    Report Status PENDING  Incomplete  Blood Culture (routine x 2)     Status: None (Preliminary result)   Collection Time: 09/04/19 12:32 AM   Specimen: BLOOD  Result Value Ref Range Status   Specimen Description BLOOD RIGHT FOREARM  Final   Special Requests   Final    BOTTLES DRAWN AEROBIC AND ANAEROBIC Blood Culture results may not be optimal due to an inadequate volume of blood received in culture bottles   Culture   Final    NO GROWTH 4 DAYS Performed at [facility], [address] [place], [place] [address]    Report Status PENDING  Incomplete  SARS CORONAVIRUS 2 (TAT 6-24 HRS) Nasopharyngeal Nasopharyngeal Swab     Status: Abnormal   Collection Time: 09/07/19  8:40 AM   Specimen: Nasopharyngeal Swab  Result Value Ref Range Status   SARS Coronavirus 2 POSITIVE (A) NEGATIVE Final    Comment: RESULT CALLED TO, READ BACK BY AND VERIFIED WITH: S.ELLIOT RN [id number] 09/07/19 [place]  K (NOTE) SARS-CoV-2 target nucleic acids are DETECTED. The SARS-CoV-2 RNA is generally detectable in upper and lower respiratory  specimens during the acute phase of infection. Positive results are indicative of the presence of SARS-CoV-2 RNA. Clinical correlation with patient history and other diagnostic information is  necessary to determine patient infection status. Positive results do not rule out bacterial infection or co-infection with other viruses.  The expected result is Negative. Fact Sheet for Patients: SugarRoll.be Fact Sheet for Healthcare Providers: https://www.woods-mathews.com/ This test is not yet approved or cleared by the [place] FDA and  has been authorized for detection and/or diagnosis of SARS-CoV-2 by FDA under an Emergency Use Authorization (EUA). This EUA will remain  in effect (meaning this test can be used) for t he duration of the COVID-19 declaration under Section 564(b)(1) of the Act, 21 U.S.C. section 360bbb-3(b)(1), unless the authorization is terminated or revoked sooner. Performed at [facility], [place] [address], [place], [place] [address]      Time coordinating discharge: Over 30 minutes  SIGNED:   Sidney Ace, MD  [facility] 09/08/2019, 10:19 AM Pager [phone]  If 7PM-7AM, please contact night-coverage [email or web address] Password [email or web address]

## 2019-09-07 LAB — CBC WITH DIFFERENTIAL/PLATELET
Abs Immature Granulocytes: 0.04 10*3/uL (ref 0.00–0.07)
Basophils Absolute: 0 10*3/uL (ref 0.0–0.1)
Basophils Relative: 0 %
Eosinophils Absolute: 0 10*3/uL (ref 0.0–0.5)
Eosinophils Relative: 0 %
HCT: 36.6 % — ABNORMAL LOW (ref 39.0–52.0)
Hemoglobin: 12.2 g/dL — ABNORMAL LOW (ref 13.0–17.0)
Immature Granulocytes: 1 %
Lymphocytes Relative: 11 %
Lymphs Abs: 1 10*3/uL (ref 0.7–4.0)
MCH: 30.9 pg (ref 26.0–34.0)
MCHC: 33.3 g/dL (ref 30.0–36.0)
MCV: 92.7 fL (ref 80.0–100.0)
Monocytes Absolute: 0.6 10*3/uL (ref 0.1–1.0)
Monocytes Relative: 7 %
Neutro Abs: 7.2 10*3/uL (ref 1.7–7.7)
Neutrophils Relative %: 81 %
Platelets: 149 10*3/uL — ABNORMAL LOW (ref 150–400)
RBC: 3.95 MIL/uL — ABNORMAL LOW (ref 4.22–5.81)
RDW: 12.3 % (ref 11.5–15.5)
WBC: 8.9 10*3/uL (ref 4.0–10.5)
nRBC: 0 % (ref 0.0–0.2)

## 2019-09-07 LAB — BASIC METABOLIC PANEL
Anion gap: 10 (ref 5–15)
BUN: 33 mg/dL — ABNORMAL HIGH (ref 8–23)
CO2: 23 mmol/L (ref 22–32)
Calcium: 8.4 mg/dL — ABNORMAL LOW (ref 8.9–10.3)
Chloride: 105 mmol/L (ref 98–111)
Creatinine, Ser: 1.1 mg/dL (ref 0.61–1.24)
GFR calc Af Amer: >60 mL/min (ref >60)
GFR calc non Af Amer: >60 mL/min (ref >60)
Glucose, Bld: 103 mg/dL — ABNORMAL HIGH (ref 70–99)
Potassium: 4 mmol/L (ref 3.5–5.1)
Sodium: 138 mmol/L (ref 135–145)

## 2019-09-07 LAB — SARS CORONAVIRUS 2 (TAT 6-24 HRS): SARS Coronavirus 2: POSITIVE — AB

## 2019-09-07 LAB — GLUCOSE, CAPILLARY: Glucose-Capillary: 138 mg/dL — ABNORMAL HIGH (ref 70–99)

## 2019-09-07 MED ORDER — MELATONIN 5 MG PO TABS
5.0000 mg | ORAL_TABLET | Freq: Every evening | ORAL | Status: DC | PRN
  Administered 2019-09-07: 5 mg via ORAL
  Filled 2019-09-07 (×2): qty 1

## 2019-09-07 MED ORDER — NON FORMULARY: 5.0000 mg | Freq: Every evening | Status: DC | PRN

## 2019-09-07 MED ORDER — LABETALOL HCL 5 MG/ML IV SOLN: 10.0000 mg | INTRAVENOUS | Status: DC | PRN

## 2019-09-07 NOTE — Progress Notes (Signed)
Assisted tele visit to patient with provider. ° °Mcdonald Reiling P, RN  °

## 2019-09-07 NOTE — Progress Notes (Signed)
PROGRESS NOTE    Joel Wilson.  [id number] DOB: 08-04-1941 DOA: 09/03/2019 PCP: [facility]    Brief Narrative:  79 year old male admitted 1/1 with acute hypoxic respiratory failure and septic shock requiring vasopressors in the setting of COVID-19 infection.  1/1>>Admission to ICU 1/2 - off vasopressors, on 2L nasal canula now, will optmize for downgrade to medical floor 1/3: Remained stable off pressors.  On room air.  Feels well. Transferred to med surg/hospitalist service 1/4: No changes.  On room air.  Feels well.  Pending placement   Assessment & Plan:   Active Problems:   COVID-19   Acute hypoxemic respiratory failure due to COVID-19 [facility])  Acute hypoxic respiratory failure secondary to COVID-19 infection -Supplemental O2 as needed -  -Continue high flow nasal cannula, wean as tolerated -Allow for permissive hypoxia as long as patient asymptomatic -Encourage self proning -Incentive spirometry and flutter valve -Follow intermittent chest x-ray and ABG as needed -Continue IV Decadron and remdesivir (day 4/5) -Will hold off on empiric CAP coverage for now as no infiltrates or opacities noted on chest x-ray, and procalcitonin 0.2 in the setting of renal failure -Trend inflammatory markers -CTA chest on 12/29 negative for PE -Vitamin C, zinc, multivitamin -Monitor fever curve -Trend WBCs and procalcitonin -Follow cultures as above Patient is medically stable for discharge from the hospital at this time however he may need negative Covid test prior to returning to his prior place of residence.  Transitions of care team is aware and is currently investigating solutions.  Septic shock in the setting of COVID-19 infection (resolved) -Continuous cardiac monitoring -Maintain MAP greater than 65 -Received IV fluid resuscitation in the ED -No indication for pressor support at this time - Hold home BP meds   AKI (improved) -Monitor I&O's / urinary  output -Follow BMP -Ensure adequate renal perfusion -Avoid nephrotoxic agents as able -Replace electrolytes as indicated    DVT prophylaxis: SQH Code Status: FULL Family Communication: none today Disposition Plan: SNF vs ALF   Consultants:   ICU  Procedures:  none  Antimicrobials:   remdesivir (09/04/19-  )   Subjective: Seen and examined No acute events Weaned off oxygen No pressor requirement   Objective: Vitals:   09/07/19 0600 09/07/19 0800 09/07/19 1000 09/07/19 1100  BP: (!) 158/75 (!) 145/61 (!) 160/73 (!) 156/85  Pulse:      Resp: 16 15 20 15   Temp:      TempSrc:      SpO2:  96%    Weight:      Height:        Intake/Output Summary (Last 24 hours) at 09/07/2019 1535 Last data filed at 09/07/2019 1531 Gross per 24 hour  Intake --  Output 900 ml  Net -900 ml   Filed Weights   09/04/19 0039 09/04/19 0800 09/07/19 0500  Weight: 81.6 kg 86.4 kg 90.1 kg    Examination:  General exam: Appears calm and comfortable  Respiratory system: Scattered crackles.  Work of breathing not increased. Cardiovascular system: S1 & S2 heard, RRR. No JVD, murmurs, rubs, gallops or clicks. No pedal edema. Gastrointestinal system: Abdomen is nondistended, soft and nontender. No organomegaly or masses felt. Normal bowel sounds heard. Central nervous system: Alert and oriented. No focal neurological deficits. Extremities: Symmetric 5 x 5 power. Skin: No rashes, lesions or ulcers Psychiatry: Judgement and insight appear normal. Mood & affect appropriate.     Data Reviewed: I have personally reviewed following labs and imaging studies  CBC: Recent Labs  Lab 09/01/19 1519 09/04/19 0032 09/05/19 0427 09/06/19 0318 09/07/19 0512  WBC 6.4 8.7 13.5* 11.2* 8.9  NEUTROABS 4.3 6.7  --  9.9* 7.2  HGB 13.8 13.8 11.9* 11.6* 12.2*  HCT 41.9 40.7 36.0* 33.2* 36.6*  MCV 95.7 92.5 95.5 90.5 92.7  PLT 231 221 190 158 [id number]*   Basic Metabolic Panel: Recent Labs  Lab  09/01/19 1519 09/04/19 0032 09/05/19 0427 09/06/19 0318 09/07/19 0512  NA 137 138 140 141 138  K 3.6 3.6 3.0* 4.0 4.0  CL 96* 97* 103 107 105  CO2 29 27 24 25 23   GLUCOSE 103* 114* 132* 141* 103*  BUN 20 36* 41* 36* 33*  CREATININE 1.44* 2.71* 1.72* 1.30* 1.10  CALCIUM 9.2 8.6* 8.1* 8.3* 8.4*  MG  --   --  2.1  --   --   PHOS  --   --  3.3  --   --    GFR: Estimated Creatinine Clearance: 58 mL/min (by C-G formula based on SCr of 1.1 mg/dL). Liver Function Tests: Recent Labs  Lab 09/01/19 1519 09/04/19 0032 09/05/19 0427 09/06/19 0318  AST 29 58* 48* 47*  ALT 31 62* 59* 65*  ALKPHOS 94 75 58 54  BILITOT 1.1 1.0 0.8 0.7  PROT 7.7 7.3 6.0* 5.9*  ALBUMIN 3.2* 3.1* 2.5* 2.6*   No results for input(s): LIPASE, AMYLASE in the last 168 hours. No results for input(s): AMMONIA in the last 168 hours. Coagulation Profile: No results for input(s): INR, PROTIME in the last 168 hours. Cardiac Enzymes: Recent Labs  Lab 09/01/19 1519  CKTOTAL 205   BNP (last 3 results) No results for input(s): PROBNP in the last 8760 hours. HbA1C: No results for input(s): HGBA1C in the last 72 hours. CBG: Recent Labs  Lab 09/04/19 0724 09/04/19 0807  GLUCAP 156* 138*   Lipid Profile: No results for input(s): CHOL, HDL, LDLCALC, TRIG, CHOLHDL, LDLDIRECT in the last 72 hours. Thyroid Function Tests: No results for input(s): TSH, T4TOTAL, FREET4, T3FREE, THYROIDAB in the last 72 hours. Anemia Panel: Recent Labs    09/05/19 0427  FERRITIN 160   Sepsis Labs: Recent Labs  Lab 09/01/19 1519 09/04/19 0032 09/05/19 0427 09/06/19 0318  PROCALCITON <0.10 0.21 <0.10 <0.10  LATICACIDVEN 1.1 1.6  --   --     Recent Results (from the past 240 hour(s))  SARS CORONAVIRUS 2 (TAT 6-24 HRS) Nasopharyngeal Nasopharyngeal Swab     Status: Abnormal   Collection Time: 09/01/19  5:20 PM   Specimen: Nasopharyngeal Swab  Result Value Ref Range Status   SARS Coronavirus 2 POSITIVE (A) NEGATIVE  Final    Comment: RESULT CALLED TO, READ BACK BY AND VERIFIED WITH: RN RAQUEL DAVID AT [place] ON 09/02/2019 (NOTE) SARS-CoV-2 target nucleic acids are DETECTED. The SARS-CoV-2 RNA is generally detectable in upper and lower respiratory specimens during the acute phase of infection. Positive results are indicative of the presence of SARS-CoV-2 RNA. Clinical correlation with patient history and other diagnostic information is  necessary to determine patient infection status. Positive results do not rule out bacterial infection or co-infection with other viruses.  The expected result is Negative. Fact Sheet for Patients: SugarRoll.be Fact Sheet for Healthcare Providers: https://www.woods-mathews.com/ This test is not yet approved or cleared by the [place] FDA and  has been authorized for detection and/or diagnosis of SARS-CoV-2 by FDA under an Emergency Use Authorization (EUA). This EUA will remain  in effect (meaning  this te st can be used) for the duration of the COVID-19 declaration under Section 564(b)(1) of the Act, 21 U.S.C. section 360bbb-3(b)(1), unless the authorization is terminated or revoked sooner. Performed at [facility], [place] [address] [place], [place] [address]   MRSA PCR Screening     Status: Abnormal   Collection Time: 09/03/19 11:59 PM   Specimen: Nasal Mucosa; Nasopharyngeal  Result Value Ref Range Status   MRSA by PCR POSITIVE (A) NEGATIVE Final    Comment:        The GeneXpert MRSA Assay (FDA approved for NASAL specimens only), is one component of a comprehensive MRSA colonization surveillance program. It is not intended to diagnose MRSA infection nor to guide or monitor treatment for MRSA infections. RESULT CALLED TO, READ BACK BY AND VERIFIED WITH: AMY SHANAHAN AT 0959 ON 09/04/2019 [place]. Performed at [facility], [address] [place], [place] [address]   Blood Culture  (routine x 2)     Status: None (Preliminary result)   Collection Time: 09/04/19 12:32 AM   Specimen: BLOOD  Result Value Ref Range Status   Specimen Description BLOOD LEFT WRIST  Final   Special Requests   Final    BOTTLES DRAWN AEROBIC AND ANAEROBIC Blood Culture adequate volume   Culture   Final    NO GROWTH 3 DAYS Performed at [facility], [address], [place], [place] [address]    Report Status PENDING  Incomplete  Blood Culture (routine x 2)     Status: None (Preliminary result)   Collection Time: 09/04/19 12:32 AM   Specimen: BLOOD  Result Value Ref Range Status   Specimen Description BLOOD RIGHT FOREARM  Final   Special Requests   Final    BOTTLES DRAWN AEROBIC AND ANAEROBIC Blood Culture results may not be optimal due to an inadequate volume of blood received in culture bottles   Culture   Final    NO GROWTH 3 DAYS Performed at [facility], [address], [place], [place] [address]    Report Status PENDING  Incomplete         Radiology Studies: No results found.      Scheduled Meds: . amiodarone  200 mg Oral QHS  . vitamin C  500 mg Oral Daily  . chlorhexidine  15 mL Mouth Rinse BID  . Chlorhexidine Gluconate Cloth  6 each Topical Daily  . clopidogrel  75 mg Oral Daily  . dexamethasone (DECADRON) injection  6 mg Intravenous Q24H  . feeding supplement (ENSURE ENLIVE)  237 mL Oral TID BM  . finasteride  5 mg Oral Daily  . gabapentin  100 mg Oral TID  . heparin  5,000 Units Subcutaneous Q8H  . loratadine  10 mg Oral Daily  . mouth rinse  15 mL Mouth Rinse q12n4p  . methocarbamol  500 mg Oral TID  . multivitamin with minerals  1 tablet Oral Daily  . mupirocin ointment  1 application Nasal BID  . pantoprazole (PROTONIX) IV  40 mg Intravenous QHS  . senna  2 tablet Oral Daily  . simvastatin  10 mg Oral QHS  . tamsulosin  0.4 mg Oral Daily  . zinc sulfate  220 mg Oral Daily   Continuous Infusions: . norepinephrine (LEVOPHED) Adult  infusion Stopped (09/05/19 0656)  . remdesivir 100 mg in NS 100 mL 100 mg (09/07/19 0835)     LOS: 3 days    Time spent: 35 minutes    Osker Ayoub  Sloan Leiter, MD [facility] Pager [phone]  If 7PM-7AM, please contact night-coverage [email or web address] Password [email or web address] 09/07/2019, 3:35 PM

## 2019-09-08 MED ORDER — PANTOPRAZOLE SODIUM 40 MG PO TBEC
40.0000 mg | DELAYED_RELEASE_TABLET | Freq: Every day | ORAL | Status: DC
  Administered 2019-09-08: 40 mg via ORAL
  Filled 2019-09-08: qty 1

## 2019-09-08 MED ORDER — PANTOPRAZOLE SODIUM 40 MG PO TBEC: 40.0000 mg | DELAYED_RELEASE_TABLET | Freq: Every day | ORAL | Status: DC

## 2019-09-08 NOTE — Care Management Important Message (Signed)
Important Message  Patient Details  Name: Joel Wilson. MRN: [id number] Date of Birth: [birth year]   Medicare Important Message Given:  Yes  Daughter returned message.  Reviewed Medicare IM.  Requested copy be faxed to her attention at [phone].    Dannette Barbara 09/08/2019, 10:55 AM

## 2019-09-08 NOTE — NC FL2 (Signed)
[place] LEVEL OF CARE SCREENING TOOL     IDENTIFICATION  Patient Name: Joel Wilson. Birthdate: 23-Dec-1940 Sex: male Admission Date (Current Location): 09/03/2019  [place] and [place] Number:  Engineering geologist and Address:  [facility], [address] [place], [place] [address]      Provider Number: [id number]  Attending Physician Name and Address:  Sidney Ace, MD  Relative Name and Phone Number:       Current Level of Care: Hospital Recommended Level of Care: [place] Prior Approval Number:    Date Approved/Denied:   PASRR Number: [id number]  Discharge Plan: SNF    Current Diagnoses: Patient Active Problem List   Diagnosis Date Noted  . COVID-19 09/04/2019  . Acute hypoxemic respiratory failure due to COVID-19 ([place]) 09/04/2019  . Lobar pneumonia ([place]) 08/16/2019  . SIRS (systemic inflammatory response syndrome) ([place]) 08/16/2019  . Pressure injury of skin 08/12/2019  . Fall 08/11/2019  . Acute respiratory failure with hypoxia ([place]) 08/11/2019  . Generalized weakness 08/11/2019  . Liver lesion 08/11/2019  . Acute renal failure superimposed on stage 3a chronic kidney disease ([place]) 08/11/2019  . HTN (hypertension) 08/11/2019  . Seasonal allergic rhinitis 01/03/2017  . Chronic pain syndrome 05/16/2016  . Chronic lumbar pain 03/23/2016  . Abnormality of gait 12/09/2015  . Insomnia 12/09/2015  . Vitamin D deficiency 11/08/2015  . Peripheral neuropathy 11/08/2015  . BPH without obstruction/lower urinary tract symptoms 11/08/2015  . Hypertensive heart disease   . Paroxysmal atrial fibrillation (HCC)   . Long term (current) use of anticoagulants 09/07/2013  . Atrial fibrillation ([place]) 09/04/2013  . Coronary artery disease   . Hyperlipidemia     Orientation RESPIRATION BLADDER Height & Weight     Self, Time, Situation, Place  Normal   Weight: 85.7 kg Height:  5\' 11"  (180.3 cm)   BEHAVIORAL SYMPTOMS/MOOD NEUROLOGICAL BOWEL NUTRITION STATUS      Incontinent Diet(regular)  AMBULATORY STATUS COMMUNICATION OF NEEDS Skin   Extensive Assist Verbally PU Stage and Appropriate Care, Bruising                       Personal Care Assistance Level of Assistance              Functional Limitations Info             SPECIAL CARE FACTORS FREQUENCY  PT (By licensed PT), OT (By licensed OT)                    Contractures Contractures Info: Not present    Additional Factors Info  Code Status, Allergies Code Status Info: Full Allergies Info: Aspirin           Current Medications (09/08/2019):  This is the current hospital active medication list Current Facility-Administered Medications  Medication Dose Route Frequency Provider Last Rate Last Admin  . acetaminophen (TYLENOL) tablet 650 mg  650 mg Oral Q4H PRN Bradly Bienenstock, NP      . alum & mag hydroxide-simeth (MAALOX/MYLANTA) 200-200-20 MG/5ML suspension 30 mL  30 mL Oral Q6H PRN Darel Hong D, NP      . amiodarone (PACERONE) tablet 200 mg  200 mg Oral QHS Darel Hong D, NP   200 mg at 09/07/19 1936  . ascorbic acid (VITAMIN C) tablet 500 mg  500 mg Oral Daily Darel Hong D, NP   500 mg at 09/08/19 0850  . chlorhexidine ([place])  0.12 % solution 15 mL  15 mL Mouth Rinse BID Ottie Glazier, MD   15 mL at 09/08/19 0853  . Chlorhexidine Gluconate Cloth 2 % PADS 6 each  6 each Topical Daily Ottie Glazier, MD   6 each at 09/05/19 [phone]  . clopidogrel (PLAVIX) tablet 75 mg  75 mg Oral Daily Darel Hong D, NP   75 mg at 09/08/19 0849  . dexamethasone (DECADRON) injection 6 mg  6 mg Intravenous Q24H Ralene Muskrat B, MD   6 mg at 09/08/19 1112  . feeding supplement (ENSURE ENLIVE) (ENSURE ENLIVE) liquid 237 mL  237 mL Oral TID BM Ralene Muskrat B, MD   237 mL at 09/08/19 0853  . finasteride (PROSCAR) tablet 5 mg  5 mg Oral Daily Darel Hong D, NP   5 mg at 09/08/19 0850  .  gabapentin (NEURONTIN) capsule 100 mg  100 mg Oral TID Darel Hong D, NP   100 mg at 09/08/19 0850  . heparin injection 5,000 Units  5,000 Units Subcutaneous Q8H Darel Hong D, NP   5,000 Units at 09/08/19 [phone]  . labetalol (NORMODYNE) injection 10 mg  10 mg Intravenous Q4H PRN Sreenath, Sudheer B, MD      . loratadine (CLARITIN) tablet 10 mg  10 mg Oral Daily Darel Hong D, NP   10 mg at 09/08/19 0850  . MEDLINE mouth rinse  15 mL Mouth Rinse q12n4p Ottie Glazier, MD   15 mL at 09/06/19 2045  . Melatonin TABS 5 mg  5 mg Oral QHS PRN Ralene Muskrat B, MD   5 mg at 09/07/19 2255  . methocarbamol (ROBAXIN) tablet 500 mg  500 mg Oral TID Ralene Muskrat B, MD   500 mg at 09/08/19 0849  . multivitamin with minerals tablet 1 tablet  1 tablet Oral Daily Darel Hong D, NP   1 tablet at 09/08/19 0850  . mupirocin ointment (BACTROBAN) 2 % 1 application  1 application Nasal BID Ottie Glazier, MD   1 application at [id number] [phone]  . norepinephrine (LEVOPHED) 4mg  in 233mL premix infusion  0-40 mcg/min Intravenous Titrated Bradly Bienenstock, NP   Stopped at 09/05/19 [phone]  . ondansetron (ZOFRAN) injection 4 mg  4 mg Intravenous Q6H PRN Awilda Bill, NP   4 mg at 09/06/19 1035  . pantoprazole (PROTONIX) EC tablet 40 mg  40 mg Oral Daily Ralene Muskrat B, MD   40 mg at 09/08/19 0850  . polyethylene glycol (MIRALAX / GLYCOLAX) packet 17 g  17 g Oral Daily PRN Darel Hong D, NP   17 g at 09/04/19 2336  . remdesivir 100 mg in sodium chloride 0.9 % 100 mL IVPB  100 mg Intravenous Daily Rowland Lathe, RPH 200 mL/hr at 09/07/19 0835 100 mg at 09/07/19 0835  . senna (SENOKOT) tablet 17.2 mg  2 tablet Oral Daily Darel Hong D, NP   17.2 mg at 09/08/19 0849  . simvastatin (ZOCOR) tablet 10 mg  10 mg Oral QHS Darel Hong D, NP   10 mg at 09/07/19 1936  . tamsulosin (FLOMAX) capsule 0.4 mg  0.4 mg Oral Daily Darel Hong D, NP   0.4 mg at 09/08/19 0850  . traMADol (ULTRAM)  tablet 50 mg  50 mg Oral Q6H PRN Sharion Settler, NP   50 mg at 09/08/19 0041  . zinc sulfate capsule 220 mg  220 mg Oral Daily Darel Hong D, NP   220 mg at 09/08/19 1113     Discharge  Medications: Please see discharge summary for a list of discharge medications.  Relevant Imaging Results:  Relevant Lab Results:   Additional Information [id number]  Beverly Sessions, RN

## 2019-09-08 NOTE — Progress Notes (Signed)
Joel Wilson. to be D/C'd to [facility] per MD order.  Discussed prescriptions and follow up appointments with the patient. Prescriptions given to patient, medication list explained in detail. Pt verbalized understanding.  Allergies as of 09/08/2019       Reactions   Aspirin         Medication List     TAKE these medications    acetaminophen 325 MG tablet Commonly known as: TYLENOL Take 650 mg by mouth every 6 (six) hours as needed for mild pain or fever.   amiodarone 200 MG tablet Commonly known as: PACERONE Take 1 tablet (200 mg total) by mouth at bedtime.   Calcium 600+D 600-400 MG-UNIT tablet Generic drug: Calcium Carbonate-Vitamin D Take 1 tablet by mouth daily.   Calmoseptine 0.44-20.6 % Oint Generic drug: Menthol-Zinc Oxide Apply 1 application topically daily. (apply to buttocks)   cetirizine 10 MG tablet Commonly known as: ZYRTEC Take 10 mg by mouth daily.   cholecalciferol 1000 units tablet Commonly known as: VITAMIN D Take 2,000 Units by mouth daily.   clopidogrel 75 MG tablet Commonly known as: PLAVIX Take 75 mg by mouth daily.   Dermacloud Crea Apply 1 application topically 2 (two) times daily. (apply to buttocks and sacral area)   dexamethasone 6 MG tablet Commonly known as: Decadron Take 1 tablet (6 mg total) by mouth daily for 7 days.   diltiazem 240 MG 24 hr capsule Commonly known as: DILACOR XR Take 240 mg by mouth daily.   finasteride 5 MG tablet Commonly known as: PROSCAR Take 5 mg by mouth daily.   furosemide 20 MG tablet Commonly known as: LASIX Take 20 mg by mouth daily.   gabapentin 600 MG tablet Commonly known as: NEURONTIN Take 1,200 mg by mouth 3 (three) times daily.   Lidocaine 4 % Ptch Place 1 patch onto the skin daily. (apply to right shoulder)   lisinopril 10 MG tablet Commonly known as: ZESTRIL Take 10 mg by mouth daily. (hold if SBP<110)   loperamide 2 MG capsule Commonly known as: IMODIUM Take  2 mg by mouth daily as needed for diarrhea or loose stools.   Melatonin 3 MG Tabs Take 1 tablet (3 mg total) by mouth at bedtime.   metoprolol succinate 25 MG 24 hr tablet Commonly known as: TOPROL-XL Take 12.5 mg by mouth 2 (two) times daily.   Minerin Creme Crea Apply 1 application topically daily. (apply to legs and knees)   Mintox [id number] MG/5ML suspension Generic drug: alum & mag hydroxide-simeth Take 30 mLs by mouth every 6 (six) hours as needed for indigestion or heartburn.   neomycin-bacitracin-polymyxin [phone] ointment Apply 1 application topically daily as needed (tears or abrasions).   nitroGLYCERIN 0.4 MG SL tablet Commonly known as: NITROSTAT Place 0.4 mg under the tongue every 5 (five) minutes as needed for chest pain.   nystatin powder Commonly known as: MYCOSTATIN/NYSTOP Apply 1 application topically 4 (four) times daily.   ondansetron 4 MG tablet Commonly known as: ZOFRAN Take 4 mg by mouth every 6 (six) hours as needed for nausea or vomiting.   Oxycodone HCl 20 MG Tabs Take 0.5 tablets (10 mg total) by mouth 3 (three) times daily.   pantoprazole 40 MG tablet Commonly known as: PROTONIX Take 1 tablet (40 mg total) by mouth daily.   polyethylene glycol 17 g packet Commonly known as: MIRALAX / GLYCOLAX Take 17 g by mouth daily as needed for mild constipation.   senna 8.6 MG tablet  Commonly known as: SENOKOT Take 2 tablets by mouth daily.   simvastatin 10 MG tablet Commonly known as: ZOCOR Take 10 mg by mouth at bedtime.   tamsulosin 0.4 MG Caps capsule Commonly known as: FLOMAX Take 0.4 mg by mouth daily.   triamcinolone ointment 0.1 % Commonly known as: KENALOG Apply 1 application topically 2 (two) times daily. (apply to feet, lower legs and ankles)   vitamin B-12 1000 MCG tablet Commonly known as: CYANOCOBALAMIN Take 1,000 mcg by mouth daily.        Vitals:   09/08/19 0400 09/08/19 0500  BP:    Pulse:    Resp: 16 15  Temp:     SpO2:      Skin clean, dry and intact without evidence of skin break down, no evidence of skin tears noted. IV catheter discontinued intact. Site without signs and symptoms of complications. Dressing and pressure applied. Pt denies pain at this time. No complaints noted.  An After Visit Summary was printed and given to the patient. Patient escorted via [place], and D/C home via private auto.  [place] A Joel Wilson

## 2019-09-08 NOTE — TOC Transition Note (Signed)
Transition of Care [facility]) - CM/SW Discharge Note   Patient Details  Name: Joel Wilson. MRN: [id number] Date of Birth: 17-Sep-1940  Transition of Care [facility]) CM/SW Contact:  Beverly Sessions, RN Phone Number: 09/08/2019, 3:11 PM   Clinical Narrative:    Patient to return to [facility] care today.  Daughter and son in agreement  Discharge information [facility]  EMS packet on chart.  Bedside RN notified  Spoke with [place] at [facility].  They are unable to take patient until he is 10 days from his first positive covid test, then has to have 2 negatives.     Final next level of care: Skilled Nursing Facility Barriers to Discharge: No Barriers Identified   Patient Goals and CMS Choice Patient states their goals for this hospitalization and ongoing recovery are:: Pt's son, "return back to [employer]" CMS Medicare.gov Compare Post Acute Care list provided to:: Patient Represenative (must comment)(pt's son, rusty) Choice offered to / list presented to : Adult Children, Patient  Discharge Placement              Patient chooses bed at: [facility] Patient to be transferred to facility by: EMS Name of family member notified: Daughter and Son Patient and family notified of of transfer: 09/08/19  Discharge Plan and Services In-house Referral: Clinical Social Work                                   Social Determinants of Health (SDOH) Interventions     Readmission Risk Interventions Readmission Risk Prevention Plan 09/08/2019 09/06/2019  Transportation Screening Complete Complete  PCP or Specialist Appt within 3-5 Days (No Data) -  HRI or [place] - Patient refused  Social Work Consult for [place] Planning/Counseling - Complete  Palliative Care Screening - Not Applicable  Medication Review [employer]) Complete Complete  Some recent data might be hidden

## 2019-09-08 NOTE — Care Management Important Message (Signed)
Important Message  Patient Details  Name: Joel Wilson. MRN: [id number] Date of Birth: [birth year]   Medicare Important Message Given:  Other (see comment)  Attempted to review with patient via room phone, no answer. Left message with daughter to review Medicare IM.  Awaiting callback at this time.    Dannette Barbara 09/08/2019, 9:38 AM

## 2019-09-09 LAB — CULTURE, BLOOD (ROUTINE X 2)
Culture: NO GROWTH
Culture: NO GROWTH
Special Requests: ADEQUATE

## 2019-09-18 ENCOUNTER — Telehealth: Payer: Self-pay | Admitting: Cardiovascular Disease

## 2019-09-18 ENCOUNTER — Telehealth: Payer: Self-pay

## 2019-09-18 NOTE — Telephone Encounter (Signed)
3 attempts to schedule fu appt from recall list.   Deleting recall.   

## 2019-09-22 ENCOUNTER — Inpatient Hospital Stay
Admission: EM | Admit: 2019-09-22 | Discharge: 2019-09-23 | DRG: 871 | Disposition: A | Discharge status: Other Acute Inpatient Hospital | Payer: Medicare Other | Source: Skilled Nursing Facility | Attending: Internal Medicine | Admitting: Internal Medicine

## 2019-09-22 ENCOUNTER — Encounter: Payer: Self-pay | Admitting: Emergency Medicine

## 2019-09-22 ENCOUNTER — Emergency Department: Payer: Medicare Other

## 2019-09-22 ENCOUNTER — Other Ambulatory Visit: Payer: Self-pay

## 2019-09-22 DIAGNOSIS — Z8701 Personal history of pneumonia (recurrent): Secondary | ICD-10-CM

## 2019-09-22 DIAGNOSIS — N184 Chronic kidney disease, stage 4 (severe): Secondary | ICD-10-CM | POA: Diagnosis present

## 2019-09-22 DIAGNOSIS — K0889 Other specified disorders of teeth and supporting structures: Secondary | ICD-10-CM | POA: Diagnosis present

## 2019-09-22 DIAGNOSIS — Z87891 Personal history of nicotine dependence: Secondary | ICD-10-CM

## 2019-09-22 DIAGNOSIS — G894 Chronic pain syndrome: Secondary | ICD-10-CM | POA: Diagnosis present

## 2019-09-22 DIAGNOSIS — N401 Enlarged prostate with lower urinary tract symptoms: Secondary | ICD-10-CM | POA: Diagnosis present

## 2019-09-22 DIAGNOSIS — Z886 Allergy status to analgesic agent status: Secondary | ICD-10-CM

## 2019-09-22 DIAGNOSIS — R7401 Elevation of levels of liver transaminase levels: Secondary | ICD-10-CM | POA: Diagnosis present

## 2019-09-22 DIAGNOSIS — G4700 Insomnia, unspecified: Secondary | ICD-10-CM | POA: Diagnosis present

## 2019-09-22 DIAGNOSIS — G9341 Metabolic encephalopathy: Secondary | ICD-10-CM | POA: Diagnosis present

## 2019-09-22 DIAGNOSIS — N3000 Acute cystitis without hematuria: Secondary | ICD-10-CM

## 2019-09-22 DIAGNOSIS — E860 Dehydration: Secondary | ICD-10-CM | POA: Diagnosis present

## 2019-09-22 DIAGNOSIS — E559 Vitamin D deficiency, unspecified: Secondary | ICD-10-CM | POA: Diagnosis present

## 2019-09-22 DIAGNOSIS — I1310 Hypertensive heart and chronic kidney disease without heart failure, with stage 1 through stage 4 chronic kidney disease, or unspecified chronic kidney disease: Secondary | ICD-10-CM | POA: Diagnosis present

## 2019-09-22 DIAGNOSIS — R350 Frequency of micturition: Secondary | ICD-10-CM | POA: Diagnosis present

## 2019-09-22 DIAGNOSIS — I480 Paroxysmal atrial fibrillation: Secondary | ICD-10-CM | POA: Diagnosis present

## 2019-09-22 DIAGNOSIS — N179 Acute kidney failure, unspecified: Secondary | ICD-10-CM | POA: Diagnosis present

## 2019-09-22 DIAGNOSIS — A419 Sepsis, unspecified organism: Secondary | ICD-10-CM | POA: Diagnosis present

## 2019-09-22 DIAGNOSIS — Z7902 Long term (current) use of antithrombotics/antiplatelets: Secondary | ICD-10-CM

## 2019-09-22 DIAGNOSIS — L89159 Pressure ulcer of sacral region, unspecified stage: Secondary | ICD-10-CM

## 2019-09-22 DIAGNOSIS — N390 Urinary tract infection, site not specified: Secondary | ICD-10-CM | POA: Diagnosis not present

## 2019-09-22 DIAGNOSIS — R531 Weakness: Secondary | ICD-10-CM | POA: Diagnosis present

## 2019-09-22 DIAGNOSIS — J302 Other seasonal allergic rhinitis: Secondary | ICD-10-CM | POA: Diagnosis present

## 2019-09-22 DIAGNOSIS — Z7401 Bed confinement status: Secondary | ICD-10-CM

## 2019-09-22 DIAGNOSIS — Z7952 Long term (current) use of systemic steroids: Secondary | ICD-10-CM

## 2019-09-22 DIAGNOSIS — Z833 Family history of diabetes mellitus: Secondary | ICD-10-CM

## 2019-09-22 DIAGNOSIS — R52 Pain, unspecified: Secondary | ICD-10-CM | POA: Diagnosis not present

## 2019-09-22 DIAGNOSIS — Z79891 Long term (current) use of opiate analgesic: Secondary | ICD-10-CM

## 2019-09-22 DIAGNOSIS — K08409 Partial loss of teeth, unspecified cause, unspecified class: Secondary | ICD-10-CM | POA: Diagnosis present

## 2019-09-22 DIAGNOSIS — Z79890 Hormone replacement therapy (postmenopausal): Secondary | ICD-10-CM

## 2019-09-22 DIAGNOSIS — K219 Gastro-esophageal reflux disease without esophagitis: Secondary | ICD-10-CM | POA: Diagnosis present

## 2019-09-22 DIAGNOSIS — I2510 Atherosclerotic heart disease of native coronary artery without angina pectoris: Secondary | ICD-10-CM | POA: Diagnosis not present

## 2019-09-22 DIAGNOSIS — E785 Hyperlipidemia, unspecified: Secondary | ICD-10-CM | POA: Diagnosis present

## 2019-09-22 DIAGNOSIS — U071 COVID-19: Secondary | ICD-10-CM | POA: Diagnosis not present

## 2019-09-22 DIAGNOSIS — G609 Hereditary and idiopathic neuropathy, unspecified: Secondary | ICD-10-CM | POA: Diagnosis present

## 2019-09-22 DIAGNOSIS — Z79899 Other long term (current) drug therapy: Secondary | ICD-10-CM

## 2019-09-22 DIAGNOSIS — Z96642 Presence of left artificial hip joint: Secondary | ICD-10-CM | POA: Diagnosis present

## 2019-09-22 LAB — CBC WITH DIFFERENTIAL/PLATELET
Abs Immature Granulocytes: 0.1 10*3/uL — ABNORMAL HIGH (ref 0.00–0.07)
Basophils Absolute: 0 10*3/uL (ref 0.0–0.1)
Basophils Relative: 0 %
Eosinophils Absolute: 0.1 10*3/uL (ref 0.0–0.5)
Eosinophils Relative: 1 %
HCT: 40.7 % (ref 39.0–52.0)
Hemoglobin: 13.4 g/dL (ref 13.0–17.0)
Immature Granulocytes: 1 %
Lymphocytes Relative: 11 %
Lymphs Abs: 1.8 10*3/uL (ref 0.7–4.0)
MCH: 31.4 pg (ref 26.0–34.0)
MCHC: 32.9 g/dL (ref 30.0–36.0)
MCV: 95.3 fL (ref 80.0–100.0)
Monocytes Absolute: 1.3 10*3/uL — ABNORMAL HIGH (ref 0.1–1.0)
Monocytes Relative: 8 %
Neutro Abs: 13.2 10*3/uL — ABNORMAL HIGH (ref 1.7–7.7)
Neutrophils Relative %: 79 %
Platelets: 221 10*3/uL (ref 150–400)
RBC: 4.27 MIL/uL (ref 4.22–5.81)
RDW: 13.5 % (ref 11.5–15.5)
WBC: 16.5 10*3/uL — ABNORMAL HIGH (ref 4.0–10.5)
nRBC: 0 % (ref 0.0–0.2)

## 2019-09-22 LAB — COMPREHENSIVE METABOLIC PANEL
ALT: 38 U/L (ref 0–44)
AST: 43 U/L — ABNORMAL HIGH (ref 15–41)
Albumin: 2.8 g/dL — ABNORMAL LOW (ref 3.5–5.0)
Alkaline Phosphatase: 42 U/L (ref 38–126)
Anion gap: 13 (ref 5–15)
BUN: 50 mg/dL — ABNORMAL HIGH (ref 8–23)
CO2: 27 mmol/L (ref 22–32)
Calcium: 8.7 mg/dL — ABNORMAL LOW (ref 8.9–10.3)
Chloride: 94 mmol/L — ABNORMAL LOW (ref 98–111)
Creatinine, Ser: 2.7 mg/dL — ABNORMAL HIGH (ref 0.61–1.24)
GFR calc Af Amer: 25 mL/min — ABNORMAL LOW (ref >60)
GFR calc non Af Amer: 21 mL/min — ABNORMAL LOW (ref >60)
Glucose, Bld: 114 mg/dL — ABNORMAL HIGH (ref 70–99)
Potassium: 4.8 mmol/L (ref 3.5–5.1)
Sodium: 134 mmol/L — ABNORMAL LOW (ref 135–145)
Total Bilirubin: 1.5 mg/dL — ABNORMAL HIGH (ref 0.3–1.2)
Total Protein: 6.4 g/dL — ABNORMAL LOW (ref 6.5–8.1)

## 2019-09-22 LAB — URINALYSIS, COMPLETE (UACMP) WITH MICROSCOPIC
Bilirubin Urine: NEGATIVE
Glucose, UA: NEGATIVE mg/dL
Ketones, ur: NEGATIVE mg/dL
Nitrite: NEGATIVE
Protein, ur: NEGATIVE mg/dL
Specific Gravity, Urine: 1.013 (ref 1.005–1.030)
pH: 5 (ref 5.0–8.0)

## 2019-09-22 LAB — PROTIME-INR
INR: 1.1 (ref 0.8–1.2)
Prothrombin Time: 13.7 seconds (ref 11.4–15.2)

## 2019-09-22 LAB — VANCOMYCIN, RANDOM: Vancomycin Rm: 17

## 2019-09-22 LAB — PROCALCITONIN: Procalcitonin: <0.1 ng/mL

## 2019-09-22 LAB — LACTIC ACID, PLASMA: Lactic Acid, Venous: 1.6 mmol/L (ref 0.5–1.9)

## 2019-09-22 MED ORDER — ONDANSETRON HCL 4 MG PO TABS: 4.0000 mg | ORAL_TABLET | Freq: Four times a day (QID) | ORAL | Status: DC | PRN

## 2019-09-22 MED ORDER — SIMVASTATIN 10 MG PO TABS
10.0000 mg | ORAL_TABLET | Freq: Every day | ORAL | Status: DC
  Administered 2019-09-22: 10 mg via ORAL
  Filled 2019-09-22: qty 1

## 2019-09-22 MED ORDER — OXYCODONE HCL 5 MG PO TABS
10.0000 mg | ORAL_TABLET | ORAL | Status: AC
  Administered 2019-09-22: 10 mg via ORAL
  Filled 2019-09-22: qty 2

## 2019-09-22 MED ORDER — CLOPIDOGREL BISULFATE 75 MG PO TABS
75.0000 mg | ORAL_TABLET | Freq: Every day | ORAL | Status: DC
  Administered 2019-09-23: 75 mg via ORAL
  Filled 2019-09-22: qty 1

## 2019-09-22 MED ORDER — TAMSULOSIN HCL 0.4 MG PO CAPS
0.4000 mg | ORAL_CAPSULE | Freq: Every day | ORAL | Status: DC
  Administered 2019-09-22: 0.4 mg via ORAL
  Filled 2019-09-22: qty 1

## 2019-09-22 MED ORDER — SODIUM CHLORIDE 0.9 % IV SOLN: INTRAVENOUS | Status: DC

## 2019-09-22 MED ORDER — FINASTERIDE 5 MG PO TABS
5.0000 mg | ORAL_TABLET | Freq: Every day | ORAL | Status: DC
  Administered 2019-09-23: 5 mg via ORAL
  Filled 2019-09-22: qty 1

## 2019-09-22 MED ORDER — AMIODARONE HCL 200 MG PO TABS
200.0000 mg | ORAL_TABLET | Freq: Every day | ORAL | Status: DC
  Administered 2019-09-22: 200 mg via ORAL
  Filled 2019-09-22 (×2): qty 1

## 2019-09-22 MED ORDER — ACETAMINOPHEN 650 MG RE SUPP: 650.0000 mg | Freq: Four times a day (QID) | RECTAL | Status: DC | PRN

## 2019-09-22 MED ORDER — VITAMIN D 25 MCG (1000 UNIT) PO TABS
1000.0000 [IU] | ORAL_TABLET | Freq: Every day | ORAL | Status: DC
  Administered 2019-09-23: 1000 [IU] via ORAL
  Filled 2019-09-22: qty 1

## 2019-09-22 MED ORDER — ONDANSETRON HCL 4 MG/2ML IJ SOLN
4.0000 mg | Freq: Four times a day (QID) | INTRAMUSCULAR | Status: DC | PRN
  Administered 2019-09-22: 4 mg via INTRAVENOUS
  Filled 2019-09-22: qty 2

## 2019-09-22 MED ORDER — ACETAMINOPHEN 325 MG PO TABS: 650.0000 mg | ORAL_TABLET | Freq: Four times a day (QID) | ORAL | Status: DC | PRN

## 2019-09-22 MED ORDER — VANCOMYCIN HCL IN DEXTROSE 1-5 GM/200ML-% IV SOLN
1000.0000 mg | Freq: Once | INTRAVENOUS | Status: AC
  Administered 2019-09-22: 1000 mg via INTRAVENOUS
  Filled 2019-09-22: qty 200

## 2019-09-22 MED ORDER — HEPARIN SODIUM (PORCINE) 5000 UNIT/ML IJ SOLN
5000.0000 [IU] | Freq: Three times a day (TID) | INTRAMUSCULAR | Status: DC
  Administered 2019-09-22 – 2019-09-23 (×2): 5000 [IU] via SUBCUTANEOUS
  Filled 2019-09-22 (×2): qty 1

## 2019-09-22 MED ORDER — TRAMADOL HCL 50 MG PO TABS
50.0000 mg | ORAL_TABLET | Freq: Two times a day (BID) | ORAL | Status: DC | PRN
  Administered 2019-09-22: 50 mg via ORAL
  Filled 2019-09-22: qty 1

## 2019-09-22 MED ORDER — VANCOMYCIN HCL IN DEXTROSE 1-5 GM/200ML-% IV SOLN: 1000.0000 mg | Freq: Once | INTRAVENOUS | Status: DC

## 2019-09-22 MED ORDER — VANCOMYCIN VARIABLE DOSE PER UNSTABLE RENAL FUNCTION (PHARMACIST DOSING): Status: DC

## 2019-09-22 MED ORDER — BISACODYL 5 MG PO TBEC: 5.0000 mg | DELAYED_RELEASE_TABLET | Freq: Every day | ORAL | Status: DC | PRN

## 2019-09-22 MED ORDER — LACTATED RINGERS IV BOLUS
1000.0000 mL | Freq: Once | INTRAVENOUS | Status: AC
  Administered 2019-09-22: 1000 mL via INTRAVENOUS

## 2019-09-22 MED ORDER — PANTOPRAZOLE SODIUM 40 MG PO TBEC
40.0000 mg | DELAYED_RELEASE_TABLET | Freq: Every day | ORAL | Status: DC
  Administered 2019-09-23: 40 mg via ORAL
  Filled 2019-09-22: qty 1

## 2019-09-22 MED ORDER — GABAPENTIN 600 MG PO TABS
600.0000 mg | ORAL_TABLET | Freq: Every day | ORAL | Status: DC
  Administered 2019-09-23: 600 mg via ORAL
  Filled 2019-09-22: qty 1

## 2019-09-22 MED ORDER — SODIUM CHLORIDE 0.9 % IV SOLN
2.0000 g | Freq: Once | INTRAVENOUS | Status: AC
  Administered 2019-09-22: 2 g via INTRAVENOUS
  Filled 2019-09-22: qty 20

## 2019-09-22 MED ORDER — SODIUM CHLORIDE 0.9 % IV SOLN: 1.0000 g | INTRAVENOUS | Status: DC

## 2019-09-22 MED ORDER — SODIUM CHLORIDE 0.9 % IV BOLUS
1000.0000 mL | Freq: Once | INTRAVENOUS | Status: AC
  Administered 2019-09-22: 1000 mL via INTRAVENOUS

## 2019-09-22 NOTE — Progress Notes (Signed)
CODE SEPSIS - PHARMACY COMMUNICATION  **Broad Spectrum Antibiotics should be administered within 1 hour of Sepsis diagnosis**  Time Code Sepsis Called/Page Received: 1521  Antibiotics Ordered: Rocephin, Vancomycin   Time of 1st antibiotic administration: 1610  Additional action taken by pharmacy: none  If necessary, Name of Provider/Nurse Contacted: n/a    [name] ,PharmD Clinical Pharmacist  09/22/2019  4:40 PM

## 2019-09-22 NOTE — Progress Notes (Signed)
PHARMACY -  BRIEF ANTIBIOTIC NOTE   Pharmacy has received consult(s) for Vancomycin from an ED provider.  The patient's profile has been reviewed for ht/wt/allergies/indication/available labs.    One time order(s) placed for Vancomycin 1g x 1 dose.  Further antibiotics/pharmacy consults should be ordered by admitting physician if indicated.                       Thank you, [name] 09/22/2019  3:22 PM

## 2019-09-22 NOTE — ED Triage Notes (Signed)
Pt via ems from [place] [facility] with generalized body aches. EMS called out for stroke symptoms, said he had was unable to move his left side. EMS states he has had no difficulty moving his left side since they got there. Pt alert & oriented x 4,

## 2019-09-22 NOTE — ED Notes (Signed)
Pt transferred to room 31, a&ox4, NAD noted, VSS. Pt has not needs at this time other than pain meds, will continue to monitor.

## 2019-09-22 NOTE — ED Provider Notes (Signed)
-----------------------------------------   3:01 PM on 09/22/2019 -----------------------------------------  Blood pressure 93/76, pulse 62, temperature 98.5 F (36.9 C), temperature source Oral, resp. rate (!) 25, height 5\' 9"  (1.753 m), weight 85.7 kg, SpO2 98 %.  Assuming care from Dr. Joni Fears.  In short, Joel Wilson. is a 79 y.o. male with a chief complaint of Generalized Body Aches .  Refer to the original H&P for additional details.  The current plan of care is to follow-up labs and imaging, plan for admission due to AKI and generalized weakness.  Results thus far are concerning for sepsis given his tachypnea and leukocytosis.  Source likely UTI versus infected sacral decubitus ulcer, will cover with Rocephin and vancomycin.  Patient did have some low blood pressure measurements, however this is improved following 2 L of IV fluid.  Case discussed with hospitalist, who accepts patient for admission.    Blake Divine, MD 09/22/19 1745

## 2019-09-22 NOTE — Consult Note (Signed)
Pharmacy Antibiotic Note  Joel Wilson. is a 79 y.o. male with a medical history including atrial fibrillation, hypertension, hyperlipidemia, peripheral neuropathy, vitamin D deficiency, GERD who was admitted on 09/22/2019 with an infected sacral decubitus ulcer.  Pharmacy has been consulted for vancomycin dosing. Patient is also on ceftriaxone for suspected UTI.   WBC 16.5, afebrile. Scr 2.70 which appears to be elevated above baseline. If etiology is pre-renal azotemia secondary to dehydration suspect that creatinine will improve with fluid resuscitation.   Plan: Vancomycin 2 g loading dose x 1 (1 g already given in ED + 1 g additional dose).   In view of elevated Scr will dose per levels for now. Continue to follow daily Scr per protocol. Cultures pending.   Height: 5\' 9"  (175.3 cm) Weight: 188 lb 15 oz (85.7 kg) IBW/kg (Calculated) : 70.7  Temp (24hrs), Avg:98.5 F (36.9 C), Min:98.5 F (36.9 C), Max:98.5 F (36.9 C)  Recent Labs  Lab 09/22/19 1302  WBC 16.5*  CREATININE 2.70*  LATICACIDVEN 1.6    Estimated Creatinine Clearance: 24.1 mL/min (A) (by C-G formula based on SCr of 2.7 mg/dL (H)).    Allergies  Allergen Reactions  . Aspirin     Antimicrobials this admission: Ceftriaxone 1/19 >>  Vancomycin 1/19 >>   Dose adjustments this admission: n/a  Microbiology results: 1/19 BCx: pending 1/19 UCx: pending   Thank you for allowing pharmacy to be a part of this patient's care.  Adrian Resident 09/22/2019 7:41 PM

## 2019-09-22 NOTE — ED Notes (Signed)
Spoke with son, Elchanan Dalesandro, who states that pt will not be able to go back to [facility] until he has had 2 negative covid tests. Spoke with Dr. Charna Archer, who states we can't do another because he was positive on 12/29 and that this is against our policy. Will inform son on next call.

## 2019-09-22 NOTE — H&P (Signed)
History and Physical    Joel Wilson. [id number] DOB: April 29, 1941 DOA: 09/22/2019  PCP: [facility]  Patient coming from: [facility] Complaint: pain in buttocks  HPI:  79 y/o M w/ PMH of a. Fib, HTN, HLD, BPH, peripheral neuropathy, vitamin D deficiency, GERD who presents from [facility] secondary to buttocks pain x 3 days. The pain is sharp, constant w/ radiation down both legs. Nothing makes the pain better and sitting on his butt makes the pain worse. The severity is currently 9/10. Pt has had above stated pain in the past secondary to sacral decubitus ulcer. Of note, pt c/o dysuria x 1 year and urinary frequency of unknown duration. Pt said he did not tell anyone about the burning with urination. Pt denies any fever, chills, sweating, dizziness/lightheadedness, chest pain, shortness of breath, nausea, vomiting, urinary urgency, diarrhea or constipation. Of note, pt is a poor historian & is alert and oriented to person & place only. Pt stated he has not walked in approx 15 years.   Review of Systems: As per HPI otherwise 10 point review of systems negative.   Past Medical History:  Diagnosis Date  . 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 Coronary artery disease    a. 2003 PCI to Ramus;  b. 11/2012 Cath: LM nl, LAD 30, LCX nl, RI 40 ISR, RCA nl.  . Hereditary and idiopathic peripheral neuropathy   . Hyperlipidemia   . Hypertension   . Hypertensive heart disease    a. 08/2013 Echo: EF 70-75%, mild LVH.  Marland Kitchen Insomnia   . Paroxysmal atrial fibrillation (HCC)    a.08/2013-->amio;  b. CHA2DS2VASc = 4-->no longer on coumadin.  . Vitamin D deficiency     Past Surgical History:  Procedure Laterality Date  . CARDIAC CATHETERIZATION  11/13/12   [facility]; EF >55%  . CARDIAC CATHETERIZATION  2003  . JOINT REPLACEMENT     left hip  . NECK SURGERY       reports that he has quit smoking. He has a 2.00 pack-year smoking history. He  has never used smokeless tobacco. He reports that he does not drink alcohol or use drugs.  Allergies  Allergen Reactions  . Aspirin     Family History  Problem Relation Age of Onset  . Diabetes Neg Hx    No family hx of heart disease. No family hx of cancer   Prior to Admission medications   Medication Sig Start Date End Date Taking? Authorizing Provider  acetaminophen (TYLENOL) 325 MG tablet Take 650 mg by mouth every 6 (six) hours as needed for mild pain or fever.    Yes [provider]  alum & mag hydroxide-simeth ([place]) 200-200-20 MG/5ML suspension Take 30 mLs by mouth every 6 (six) hours as needed for indigestion or heartburn.   Yes [provider]  amiodarone (PACERONE) 200 MG tablet Take 1 tablet (200 mg total) by mouth at bedtime. 09/30/15  Yes Theora Gianotti, NP  Calcium Carbonate-Vitamin D (CALCIUM 600+D) 600-400 MG-UNIT tablet Take 1 tablet by mouth daily.    Yes [provider]  cetirizine (ZYRTEC) 10 MG tablet Take 10 mg by mouth daily.   Yes [provider]  cholecalciferol (VITAMIN D) 1000 UNITS tablet Take 2,000 Units by mouth daily.    Yes [provider]  clopidogrel (PLAVIX) 75 MG tablet Take 75 mg by mouth daily.   Yes [provider]  diltiazem (DILACOR XR) 240 MG  24 hr capsule Take 240 mg by mouth daily.    Yes [provider]  finasteride (PROSCAR) 5 MG tablet Take 5 mg by mouth daily.    Yes [provider]  furosemide (LASIX) 20 MG tablet Take 20 mg by mouth daily.   Yes [provider]  gabapentin (NEURONTIN) 600 MG tablet Take 1,200 mg by mouth 3 (three) times daily.    Yes [provider]  Lidocaine 4 % PTCH Place 1 patch onto the skin daily. (apply to right shoulder)   Yes [provider]  lisinopril (PRINIVIL,ZESTRIL) 10 MG tablet Take 10 mg by mouth daily. (hold if SBP<110)   Yes [provider]  loperamide (IMODIUM) 2 MG capsule Take 2  mg by mouth daily as needed for diarrhea or loose stools.    Yes [provider]  Melatonin 3 MG TABS Take 1 tablet (3 mg total) by mouth at bedtime. 11/08/15  Yes Ngetich, Dinah C, NP  Menthol-Zinc Oxide (CALMOSEPTINE) 0.44-20.6 % OINT Apply 1 application topically daily. (apply to buttocks)   Yes [provider]  metoprolol succinate (TOPROL-XL) 25 MG 24 hr tablet Take 12.5 mg by mouth 2 (two) times daily.   Yes [provider]  neomycin-bacitracin-polymyxin (NEOSPORIN) [phone] ointment Apply 1 application topically daily as needed (tears or abrasions).   Yes [provider]  nitroGLYCERIN (NITROSTAT) 0.4 MG SL tablet Place 0.4 mg under the tongue every 5 (five) minutes as needed for chest pain.   Yes [provider]  nystatin (MYCOSTATIN/NYSTOP) powder Apply 1 application topically 4 (four) times daily. To groin   Yes [provider]  ondansetron (ZOFRAN) 4 MG tablet Take 4 mg by mouth every 6 (six) hours as needed for nausea or vomiting.    Yes [provider]  Oxycodone HCl 20 MG TABS Take 0.5 tablets (10 mg total) by mouth 3 (three) times daily. 08/15/19  Yes Dhungel, Nishant, MD  pantoprazole (PROTONIX) 40 MG tablet Take 1 tablet (40 mg total) by mouth daily. 09/08/19  Yes Sreenath, Sudheer B, MD  polyethylene glycol (MIRALAX / GLYCOLAX) 17 g packet Take 17 g by mouth daily as needed for mild constipation.   Yes [provider]  senna (SENOKOT) 8.6 MG tablet Take 2 tablets by mouth daily.    Yes [provider]  simvastatin (ZOCOR) 10 MG tablet Take 10 mg by mouth at bedtime.   Yes [provider]  tamsulosin (FLOMAX) 0.4 MG CAPS Take 0.4 mg by mouth daily.    Yes [provider]  triamcinolone ointment (KENALOG) 0.1 % Apply 1 application topically 2 (two) times daily. (apply to feet, lower legs and ankles)   Yes [provider]  vitamin B-12 (CYANOCOBALAMIN) 1000 MCG tablet Take 1,000 mcg  by mouth daily.   Yes [provider]    Physical Exam: Vitals:   09/22/19 1500 09/22/19 1600 09/22/19 1630 09/22/19 1700  BP: (!) 88/50 93/75 105/85 105/83  Pulse:  (!) 57  (!) 56  Resp: (!) 26   12  Temp:      TempSrc:      SpO2:  98%  97%  Weight:      Height:        Constitutional: NAD, calm, comfortable Vitals:   09/22/19 1500 09/22/19 1600 09/22/19 1630 09/22/19 1700  BP: (!) 88/50 93/75 105/85 105/83  Pulse:  (!) 57  (!) 56  Resp: (!) 26   12  Temp:  TempSrc:      SpO2:  98%  97%  Weight:      Height:       Eyes: PERRL, lids and conjunctivae normal.  ENMT: Mucous membranes are dry. Posterior pharynx clear of any exudate. Poor dentition, missing multiple teeth.  Neck: normal, supple,  Respiratory: decreased breath sounds b/l. No wheezes, rales  Cardiovascular: S1, S2 +. No rubs / gallops. No extremity edema.  Abdomen: soft, no tenderness, non-distended. Bowel sounds positive.  Musculoskeletal: no clubbing / cyanosis. Poor muscle tone. Decreased ROM of b/l LE in all direction. Frail appearing  Skin: sacral decubitus ulcer, erythematous, unknown stage  Neurologic: Moves all 4 extremities. Decreased strength in b/l LE & b/l UE Psychiatric: Abnormal judgment and insight. Alert and oriented x person & place onely. Flat mood and affect.     Labs on Admission: I have personally reviewed following labs and imaging studies  CBC: Recent Labs  Lab 09/22/19 1302  WBC 16.5*  NEUTROABS 13.2*  HGB 13.4  HCT 40.7  MCV 95.3  PLT [id number]   Basic Metabolic Panel: Recent Labs  Lab 09/22/19 1302  NA 134*  K 4.8  CL 94*  CO2 27  GLUCOSE 114*  BUN 50*  CREATININE 2.70*  CALCIUM 8.7*   GFR: Estimated Creatinine Clearance: 24.1 mL/min (A) (by C-G formula based on SCr of 2.7 mg/dL (H)). Liver Function Tests: Recent Labs  Lab 09/22/19 1302  AST 43*  ALT 38  ALKPHOS 42  BILITOT 1.5*  PROT 6.4*  ALBUMIN 2.8*   No results for input(s): LIPASE, AMYLASE  in the last 168 hours. No results for input(s): AMMONIA in the last 168 hours. Coagulation Profile: Recent Labs  Lab 09/22/19 1302  INR 1.1   Cardiac Enzymes: No results for input(s): CKTOTAL, CKMB, CKMBINDEX, TROPONINI in the last 168 hours. BNP (last 3 results) No results for input(s): PROBNP in the last 8760 hours. HbA1C: No results for input(s): HGBA1C in the last 72 hours. CBG: No results for input(s): GLUCAP in the last 168 hours. Lipid Profile: No results for input(s): CHOL, HDL, LDLCALC, TRIG, CHOLHDL, LDLDIRECT in the last 72 hours. Thyroid Function Tests: No results for input(s): TSH, T4TOTAL, FREET4, T3FREE, THYROIDAB in the last 72 hours. Anemia Panel: No results for input(s): VITAMINB12, FOLATE, FERRITIN, TIBC, IRON, RETICCTPCT in the last 72 hours. Urine analysis:    Component Value Date/Time   COLORURINE YELLOW (A) 09/22/2019 1542   APPEARANCEUR CLOUDY (A) 09/22/2019 1542   APPEARANCEUR Clear 08/29/2013 0454   LABSPEC 1.013 09/22/2019 1542   LABSPEC 1.019 08/29/2013 0454   PHURINE 5.0 09/22/2019 1542   GLUCOSEU NEGATIVE 09/22/2019 1542   GLUCOSEU Negative 08/29/2013 0454   HGBUR MODERATE (A) 09/22/2019 1542   BILIRUBINUR NEGATIVE 09/22/2019 1542   BILIRUBINUR Negative 08/29/2013 0454   KETONESUR NEGATIVE 09/22/2019 1542   PROTEINUR NEGATIVE 09/22/2019 1542   NITRITE NEGATIVE 09/22/2019 1542   LEUKOCYTESUR MODERATE (A) 09/22/2019 1542   LEUKOCYTESUR Negative 08/29/2013 0454    Radiological Exams on Admission: CT ABDOMEN PELVIS WO CONTRAST  Result Date: 09/22/2019 CLINICAL DATA:  Abdominal pain. Diverticulitis suspected. Hypertension and chest pain. Previous coronavirus infection in December. EXAM: CT CHEST, ABDOMEN AND PELVIS WITHOUT CONTRAST TECHNIQUE: Multidetector CT imaging of the chest, abdomen and pelvis was performed following the standard protocol without IV contrast. COMPARISON:  Chest radiography same day. Chest CT 09/01/2019. Abdominal CT  08/11/2019. FINDINGS: CT CHEST FINDINGS Cardiovascular: Aortic atherosclerosis. Normal heart size. Extensive coronary artery calcification. Mitral valve annular calcification.  Aortic root calcification. Mediastinum/Nodes: No mass or adenopathy. Lungs/Pleura: Scattered mild pulmonary scarring. No sign of active infiltrate, mass, effusion or collapse. Musculoskeletal: Negative CT ABDOMEN PELVIS FINDINGS Hepatobiliary: The liver parenchyma appears normal. Gallbladder is distended and contains relative hyperdensity dependently. This is probably vicarious excretion of contrast from previous injections. Cannot rule out cholecystitis. Pancreas: Normal Spleen: Normal Adrenals/Urinary Tract: Adrenal glands are normal. Kidneys show mild atrophy without focal lesion or obstruction. No bladder lesion. Stomach/Bowel: No acute bowel finding. No sign of obstruction. No inflammatory findings. Minimal diverticulosis without evidence of diverticulitis. Vascular/Lymphatic: Aortic atherosclerosis. No aneurysm. IVC is normal. No retroperitoneal adenopathy. Reproductive: Normal Other: No free fluid or air. Musculoskeletal: Chronic lumbar degenerative changes. IMPRESSION: 1. No acute finding by CT. No evidence of diverticulitis or other acute bowel pathology. 2. Aortic atherosclerosis. Coronary artery calcification. Mitral valve annular calcification. 3. Gallbladder is distended and contains relative hyperdensity dependently. This is probably vicarious excretion of contrast from previous injections. Cannot rule out cholecystitis. Aortic Atherosclerosis (ICD10-I70.0). Electronically Signed   By: Nelson Chimes M.D.   On: 09/22/2019 15:10   DG Chest 2 View  Result Date: 09/22/2019 CLINICAL DATA:  Suspected sepsis. The patient tested positive for COVID-19 09/07/2019. EXAM: CHEST - 2 VIEW COMPARISON:  Single-view of the chest 09/12/2019. CT chest and single view of the chest 09/01/2019. FINDINGS: The lungs are clear. Heart size is normal.  Atherosclerosis is seen. No pneumothorax or pleural fluid. No acute or focal bony abnormality. IMPRESSION: No acute disease. Atherosclerosis. Electronically Signed   By: Inge Rise M.D.   On: 09/22/2019 13:37   CT Head Wo Contrast  Result Date: 09/22/2019 CLINICAL DATA:  79 y.o. male with a history of CAD hypertension paroxysmal atrial fibrillation, had Covid in December 2020, who lives at [facility]. EMS was called today due to stroke symptoms and concern about not being out of the left side of his body. However EMS report patient's strength is symmetric on arrival but grossly diminished. EXAM: CT HEAD WITHOUT CONTRAST TECHNIQUE: Contiguous axial images were obtained from the base of the skull through the vertex without intravenous contrast. COMPARISON:  09/01/2019 FINDINGS: Brain: No evidence of acute infarction, hemorrhage, extra-axial collection, ventriculomegaly, or mass effect. Generalized cerebral atrophy. Periventricular white matter low attenuation likely secondary to microangiopathy. Vascular: Cerebrovascular atherosclerotic calcifications are noted. Skull: Negative for fracture or focal lesion. Sinuses/Orbits: Visualized portions of the orbits are unremarkable. Visualized portions of the paranasal sinuses are unremarkable. Visualized portions of the mastoid air cells are unremarkable. Other: None. IMPRESSION: No acute intracranial pathology. Electronically Signed   By: Kathreen Devoid   On: 09/22/2019 15:06   CT Chest Wo Contrast  Result Date: 09/22/2019 CLINICAL DATA:  Abdominal pain. Diverticulitis suspected. Hypertension and chest pain. Previous coronavirus infection in December. EXAM: CT CHEST, ABDOMEN AND PELVIS WITHOUT CONTRAST TECHNIQUE: Multidetector CT imaging of the chest, abdomen and pelvis was performed following the standard protocol without IV contrast. COMPARISON:  Chest radiography same day. Chest CT 09/01/2019. Abdominal CT 08/11/2019. FINDINGS: CT CHEST FINDINGS  Cardiovascular: Aortic atherosclerosis. Normal heart size. Extensive coronary artery calcification. Mitral valve annular calcification. Aortic root calcification. Mediastinum/Nodes: No mass or adenopathy. Lungs/Pleura: Scattered mild pulmonary scarring. No sign of active infiltrate, mass, effusion or collapse. Musculoskeletal: Negative CT ABDOMEN PELVIS FINDINGS Hepatobiliary: The liver parenchyma appears normal. Gallbladder is distended and contains relative hyperdensity dependently. This is probably vicarious excretion of contrast from previous injections. Cannot rule out cholecystitis. Pancreas: Normal Spleen: Normal Adrenals/Urinary Tract: Adrenal glands are normal.  Kidneys show mild atrophy without focal lesion or obstruction. No bladder lesion. Stomach/Bowel: No acute bowel finding. No sign of obstruction. No inflammatory findings. Minimal diverticulosis without evidence of diverticulitis. Vascular/Lymphatic: Aortic atherosclerosis. No aneurysm. IVC is normal. No retroperitoneal adenopathy. Reproductive: Normal Other: No free fluid or air. Musculoskeletal: Chronic lumbar degenerative changes. IMPRESSION: 1. No acute finding by CT. No evidence of diverticulitis or other acute bowel pathology. 2. Aortic atherosclerosis. Coronary artery calcification. Mitral valve annular calcification. 3. Gallbladder is distended and contains relative hyperdensity dependently. This is probably vicarious excretion of contrast from previous injections. Cannot rule out cholecystitis. Aortic Atherosclerosis (ICD10-I70.0). Electronically Signed   By: Nelson Chimes M.D.   On: 09/22/2019 15:10    EKG: Independently reviewed  Assessment/Plan Active Problems:   * No active hospital problems. *   Possible sepsis: etiology unclear, infected sacral decub ulcer (present on admission) vs UTI. Blood and urine cxs are pending. Will continue IV rocephin and vanco.  Possible UTI: UA shows many bacteria, mod leu. Urine cx is pending.  Continue on IV rocephin  Possible infected sacral decubitus ulcer: present on admission. Unknown stage. Continue on IV abxs. Wound care consulted. Bed bound for 15 years as per pt  Likely acute metabolic encephalopathy: no hx of dementia as per ER doc. Oriented to person & place only. Likely secondary to above infections. 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 Continue on IVFs. Avoid nephrotoxic meds. Will continue to monitor  PAF: will continue on home dose of amiodarone. Continue on tele. Hold home dose of metoprolol, diltiazem for low normal BP  HTN: will continue to hold home dose of metoprolol, lisinopril & lasix secondary to low normal BP   BPH: will continue on home dose of finasteride, tamsulosin  Peripheral neuropathy: will continue on gabapentin but at reduced dose secondary to AKI   Hx of CAD: will continue on home dose of plavix. Will hold home dose of metoprolol secondary to low normal BP  HLD: will continue on statin   Leukocytosis: likely secondary to infection. Continue on abxs  Transaminitis: etiology unclear. ALT is WNL and AST is elevated. Will continue to monitor  Hyperbilirubinemia: etiology unclear. Will continue to monitor   GERD: continue on PPI   Vitamin D deficiency: continue vit D supplement   DVT prophylaxis: heparin Code Status: full Family Communication:  Disposition Plan:  Consults called:  Admission status: inpatient    Wyvonnia Dusky MD [facility] Pager [phone]   If 7PM-7AM, please contact night-coverage [email or web address] Password [employer] [facility] [employer]  09/22/2019, 5:24 PM

## 2019-09-22 NOTE — ED Notes (Signed)
Ems reports that pt has pressure ulcer that is getting worse.

## 2019-09-22 NOTE — Consult Note (Deleted)
Pharmacy Antibiotic Note  Joel Wilson. is a 79 y.o. male admitted on 09/22/2019 with sepsis.  Pharmacy has been consulted for Vancomycin and Cefepime dosing. Patient initially arrived to ED with stroke-like symptoms, but also meets   Plan: 1) Vancomycin 1000 mg IV Q 12 hrs. Goal AUC 400-550. Expected AUC: 439.0 Expected Css: 12.6 SCr used: 1.09  2) Cefepime 2g Q8 hours   Height: 5\' 9"  (175.3 cm) Weight: 188 lb 15 oz (85.7 kg) IBW/kg (Calculated) : 70.7  Temp (24hrs), Avg:98.5 F (36.9 C), Min:98.5 F (36.9 C), Max:98.5 F (36.9 C)  Recent Labs  Lab 09/22/19 1302  WBC 16.5*  CREATININE 2.70*  LATICACIDVEN 1.6    Estimated Creatinine Clearance: 24.1 mL/min (A) (by C-G formula based on SCr of 2.7 mg/dL (H)).    Allergies  Allergen Reactions  . Aspirin     Antimicrobials this admission: Cefepime 1/19 >>  Vancomycin 1/19 >>  Ceftriaxone 1/19 x 1  Microbiology results: 1/19 BCx: pending 1/19 UCx: pending   Thank you for allowing pharmacy to be a part of this patient's care.  Joel Wilson 09/22/2019 4:24 PM

## 2019-09-22 NOTE — ED Provider Notes (Signed)
[facility] Emergency Department Provider Note  ____________________________________________  Time seen: Approximately 2:18 PM  I have reviewed the triage vital signs and the nursing notes.   HISTORY  Chief Complaint Generalized Body Aches    HPI Joel Wilson. is a 78 y.o. male with a history of CAD hypertension paroxysmal atrial fibrillation, had Covid in December 2020, who lives at [facility].  EMS was called today due to stroke symptoms and concern about not being out of the left side of his body.  However EMS report patient's strength is symmetric on arrival but grossly diminished.  Patient denies any complaints except for generalized body aches which been gradual onset, constant, no aggravating or alleviating factors.  Feels like he has been eating and drinking okay.  Denies chest pain or shortness of breath.  No cough neck pain headache or trauma.      Past Medical History:  Diagnosis Date  . 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 Coronary artery disease    a. 2003 PCI to Ramus;  b. 11/2012 Cath: LM nl, LAD 30, LCX nl, RI 40 ISR, RCA nl.  . Hereditary and idiopathic peripheral neuropathy   . Hyperlipidemia   . Hypertension   . Hypertensive heart disease    a. 08/2013 Echo: EF 70-75%, mild LVH.  Marland Kitchen Insomnia   . Paroxysmal atrial fibrillation (HCC)    a.08/2013-->amio;  b. CHA2DS2VASc = 4-->no longer on coumadin.  . Vitamin D deficiency      Patient Active Problem List   Diagnosis Date Noted  . COVID-19 09/04/2019  . Acute hypoxemic respiratory failure due to COVID-19 ([place]) 09/04/2019  . Lobar pneumonia ([place]) 08/16/2019  . SIRS (systemic inflammatory response syndrome) ([place]) 08/16/2019  . Pressure injury of skin 08/12/2019  . Fall 08/11/2019  . Acute respiratory failure with hypoxia ([place]) 08/11/2019  . Generalized weakness 08/11/2019  . Liver lesion 08/11/2019  . Acute renal failure superimposed on stage 3a  chronic kidney disease ([place]) 08/11/2019  . HTN (hypertension) 08/11/2019  . Seasonal allergic rhinitis 01/03/2017  . Chronic pain syndrome 05/16/2016  . Chronic lumbar pain 03/23/2016  . Abnormality of gait 12/09/2015  . Insomnia 12/09/2015  . Vitamin D deficiency 11/08/2015  . Peripheral neuropathy 11/08/2015  . BPH without obstruction/lower urinary tract symptoms 11/08/2015  . Hypertensive heart disease   . Paroxysmal atrial fibrillation (HCC)   . Long term (current) use of anticoagulants 09/07/2013  . Atrial fibrillation ([place]) 09/04/2013  . Coronary artery disease   . Hyperlipidemia      Past Surgical History:  Procedure Laterality Date  . CARDIAC CATHETERIZATION  11/13/12   [facility]; EF >55%  . CARDIAC CATHETERIZATION  2003  . JOINT REPLACEMENT     left hip  . NECK SURGERY       Prior to Admission medications   Medication Sig Start Date End Date Taking? Authorizing Provider  acetaminophen (TYLENOL) 325 MG tablet Take 650 mg by mouth every 6 (six) hours as needed for mild pain or fever.     [provider]  alum & mag hydroxide-simeth ([place]) 200-200-20 MG/5ML suspension Take 30 mLs by mouth every 6 (six) hours as needed for indigestion or heartburn.    [provider]  amiodarone (PACERONE) 200 MG tablet Take 1 tablet (200 mg total) by mouth at bedtime. 09/30/15   Theora Gianotti, NP  Calcium Carbonate-Vitamin D (CALCIUM 600+D) 600-400 MG-UNIT tablet Take 1 tablet by mouth daily.  [provider]  cetirizine (ZYRTEC) 10 MG tablet Take 10 mg by mouth daily.    [provider]  cholecalciferol (VITAMIN D) 1000 UNITS tablet Take 2,000 Units by mouth daily.     [provider]  clopidogrel (PLAVIX) 75 MG tablet Take 75 mg by mouth daily.    [provider]  diltiazem (DILACOR XR) 240 MG 24 hr capsule Take 240 mg by mouth daily.     [provider]  finasteride (PROSCAR) 5 MG tablet Take 5 mg by mouth daily.      [provider]  furosemide (LASIX) 20 MG tablet Take 20 mg by mouth daily.    [provider]  gabapentin (NEURONTIN) 600 MG tablet Take 1,200 mg by mouth 3 (three) times daily.     [provider]  Infant Care Products (DERMACLOUD) CREA Apply 1 application topically 2 (two) times daily. (apply to buttocks and sacral area)    [provider]  Lidocaine 4 % PTCH Place 1 patch onto the skin daily. (apply to right shoulder)    [provider]  lisinopril (PRINIVIL,ZESTRIL) 10 MG tablet Take 10 mg by mouth daily. (hold if SBP<110)    [provider]  loperamide (IMODIUM) 2 MG capsule Take 2 mg by mouth daily as needed for diarrhea or loose stools.     [provider]  Melatonin 3 MG TABS Take 1 tablet (3 mg total) by mouth at bedtime. 11/08/15   Ngetich, Dinah C, NP  Menthol-Zinc Oxide (CALMOSEPTINE) 0.44-20.6 % OINT Apply 1 application topically daily. (apply to buttocks)    [provider]  metoprolol succinate (TOPROL-XL) 25 MG 24 hr tablet Take 12.5 mg by mouth 2 (two) times daily.    [provider]  neomycin-bacitracin-polymyxin (NEOSPORIN) [phone] ointment Apply 1 application topically daily as needed (tears or abrasions).    [provider]  nitroGLYCERIN (NITROSTAT) 0.4 MG SL tablet Place 0.4 mg under the tongue every 5 (five) minutes as needed for chest pain.    [provider]  nystatin (MYCOSTATIN/NYSTOP) powder Apply 1 application topically 4 (four) times daily.     [provider]  ondansetron (ZOFRAN) 4 MG tablet Take 4 mg by mouth every 6 (six) hours as needed for nausea or vomiting.     [provider]  Oxycodone HCl 20 MG TABS Take 0.5 tablets (10 mg total) by mouth 3 (three) times daily. 08/15/19   Dhungel, Nishant, MD  pantoprazole (PROTONIX) 40 MG tablet Take 1 tablet (40 mg total) by mouth daily. 09/08/19   Sidney Ace, MD  polyethylene glycol (MIRALAX /  GLYCOLAX) 17 g packet Take 17 g by mouth daily as needed for mild constipation.    [provider]  senna (SENOKOT) 8.6 MG tablet Take 2 tablets by mouth daily.     [provider]  simvastatin (ZOCOR) 10 MG tablet Take 10 mg by mouth at bedtime.    [provider]  Skin Protectants, Misc. (MINERIN CREME) CREA Apply 1 application topically daily. (apply to legs and knees)    [provider]  tamsulosin (FLOMAX) 0.4 MG CAPS Take 0.4 mg by mouth daily.     [provider]  triamcinolone ointment (KENALOG) 0.1 % Apply 1 application topically 2 (two) times daily. (apply to feet, lower legs and ankles)    [provider]  vitamin B-12 (CYANOCOBALAMIN) 1000 MCG tablet Take 1,000 mcg by mouth daily.    [provider]  Allergies Aspirin   Family History  Problem Relation Age of [place]  . Diabetes Neg Hx     Social History Social History   Tobacco Use  . Smoking status: Former Smoker    Packs/day: 0.50    Years: 4.00    Pack years: 2.00  . Smokeless tobacco: Never Used  Substance Use Topics  . Alcohol use: No  . Drug use: No    Review of Systems  Constitutional:   No fever or chills.  ENT:   No sore throat. No rhinorrhea. Cardiovascular:   No chest pain or syncope. Respiratory:   No dyspnea or cough. Gastrointestinal:   Negative for abdominal pain, vomiting and diarrhea.  Musculoskeletal:   Negative for focal pain or swelling All other systems reviewed and are negative except as documented above in ROS and HPI.  ____________________________________________   PHYSICAL EXAM:  VITAL SIGNS: ED Triage Vitals  Enc Vitals Group     BP 09/22/19 1255 93/76     Pulse Rate 09/22/19 1255 62     Resp 09/22/19 1255 (!) 25     Temp 09/22/19 1255 98.5 F (36.9 C)     Temp Source 09/22/19 1255 Oral     SpO2 09/22/19 1255 98 %     Weight 09/22/19 1300 188 lb 15 oz (85.7 kg)     Height 09/22/19 1300 5\' 9"  (1.753 m)      Head Circumference --      Peak Flow --      Pain Score 09/22/19 1259 10     Pain Loc --      Pain Edu? --      Excl. in [place]? --     Vital signs reviewed, nursing assessments reviewed.   Constitutional:   Alert and oriented. Non-toxic appearance. Eyes:   Conjunctivae are normal. EOMI. PERRL. ENT      Head:   Normocephalic and atraumatic.      Nose: Normal      Mouth/Throat: Dry mucous membranes      Neck:   No meningismus. Full ROM. Hematological/Lymphatic/Immunilogical:   No cervical lymphadenopathy. Cardiovascular:   RRR. Symmetric bilateral radial and DP pulses.  No murmurs. Cap refill less than 2 seconds. Respiratory:   Tachypnea, normal work of breathing.  Breath sounds clear bilaterally.. Gastrointestinal:   Soft with generalized tenderness, nonfocal. Non distended. There is no CVA tenderness.  No rebound, rigidity, or guarding.  Musculoskeletal:   Normal range of motion in all extremities. No joint effusions.  No lower extremity tenderness.  No edema. Neurologic:   Normal speech and language.  Cranial nerves III through XII intact Motor grossly intact, but strength globally reduced symmetrically.  Unable to maintain either leg off the bed for any appreciable amount of time.  Gross motor movements are intact and symmetric.Marland Kitchen No acute focal neurologic deficits are appreciated.  Skin:    Skin is warm, dry and intact. No rash noted.  No petechiae, purpura, or bullae.  ____________________________________________    LABS (pertinent positives/negatives) (all labs ordered are listed, but only abnormal results are displayed) Labs Reviewed  CULTURE, BLOOD (ROUTINE X 2)  CULTURE, BLOOD (ROUTINE X 2)  COMPREHENSIVE METABOLIC PANEL  LACTIC ACID, PLASMA  LACTIC ACID, PLASMA  CBC WITH DIFFERENTIAL/PLATELET  PROTIME-INR  URINALYSIS, COMPLETE (UACMP) WITH MICROSCOPIC   ____________________________________________   EKG  Interpreted by me Sinus rhythm rate of 61, normal axis  and intervals.  Normal QRS ST segments and T waves.  ____________________________________________  RADIOLOGY  DG Chest 2 View  Result Date: 09/22/2019 CLINICAL DATA:  Suspected sepsis. The patient tested positive for COVID-19 09/07/2019. EXAM: CHEST - 2 VIEW COMPARISON:  Single-view of the chest 09/12/2019. CT chest and single view of the chest 09/01/2019. FINDINGS: The lungs are clear. Heart size is normal. Atherosclerosis is seen. No pneumothorax or pleural fluid. No acute or focal bony abnormality. IMPRESSION: No acute disease. Atherosclerosis. Electronically Signed   By: Inge Rise M.D.   On: 09/22/2019 13:37    ____________________________________________   PROCEDURES Procedures  ____________________________________________  DIFFERENTIAL DIAGNOSIS   Dehydration, electrolyte normality, pneumonia, cystitis, diverticulitis, cancer, stroke  CLINICAL IMPRESSION / ASSESSMENT AND PLAN / ED COURSE  Medications ordered in the ED: Medications  sodium chloride 0.9 % bolus 1,000 mL (has no administration in time range)  oxyCODONE (Oxy IR/ROXICODONE) immediate release tablet 10 mg (has no administration in time range)    Pertinent labs & imaging results that were available during my care of the patient were reviewed by me and considered in my medical decision making (see chart for details).  Joel Bratz. was evaluated in Emergency Department on 09/22/2019 for the symptoms described in the history of present illness. He was evaluated in the context of the global COVID-19 pandemic, which necessitated consideration that the patient might be at risk for infection with the SARS-CoV-2 virus that causes COVID-19. Institutional protocols and algorithms that pertain to the evaluation of patients at risk for COVID-19 are in a state of rapid change based on information released by regulatory bodies including the CDC and federal and state organizations. These policies and algorithms were  followed during the patient's care in the ED.   Patient presents with generalized weakness and myalgias.  No focal symptoms but there is report of some acute left-sided weakness that is not currently reproducible on exam.  Neurologic exam is nonfocal.  He is tachypneic and with abdominal tenderness.  Chest x-ray is unremarkable.  Oxygenation blood pressure and heart rate are all normal.  I will obtain CT scan of the head due to the reported neuro symptoms, CT scan of the chest without contrast due to nondiagnostic chest x-ray and tachypnea with his comorbidities and advanced age, and CT scan of the abdomen and pelvis without contrast due to his abdominal tenderness on exam.  Awaiting IV consult team to obtain IV access to do failed attempts by nursing.  Will give IV fluids for hydration, check labs.  The patient will be signed out to oncoming physician at 3:00 PM.      ____________________________________________   FINAL CLINICAL IMPRESSION(S) / ED DIAGNOSES    Final diagnoses:  Generalized weakness  Body aches  Dehydration     ED Discharge Orders    None      Portions of this note were generated with dragon dictation software. Dictation errors may occur despite best attempts at proofreading.   Carrie Mew, MD 09/22/19 1428

## 2019-09-23 ENCOUNTER — Inpatient Hospital Stay (HOSPITAL_COMMUNITY)
Admission: AD | Admit: 2019-09-23 | Discharge: 2019-09-27 | DRG: 592 | Disposition: A | Discharge status: Skilled Nursing Facility | Payer: Medicare Other | Source: Other Acute Inpatient Hospital | Attending: Internal Medicine | Admitting: Internal Medicine

## 2019-09-23 DIAGNOSIS — N390 Urinary tract infection, site not specified: Secondary | ICD-10-CM | POA: Diagnosis present

## 2019-09-23 DIAGNOSIS — L89323 Pressure ulcer of left buttock, stage 3: Secondary | ICD-10-CM | POA: Diagnosis present

## 2019-09-23 DIAGNOSIS — L89610 Pressure ulcer of right heel, unstageable: Secondary | ICD-10-CM | POA: Diagnosis present

## 2019-09-23 DIAGNOSIS — Z7952 Long term (current) use of systemic steroids: Secondary | ICD-10-CM

## 2019-09-23 DIAGNOSIS — J189 Pneumonia, unspecified organism: Secondary | ICD-10-CM | POA: Diagnosis present

## 2019-09-23 DIAGNOSIS — Z993 Dependence on wheelchair: Secondary | ICD-10-CM

## 2019-09-23 DIAGNOSIS — E860 Dehydration: Secondary | ICD-10-CM

## 2019-09-23 DIAGNOSIS — L89153 Pressure ulcer of sacral region, stage 3: Principal | ICD-10-CM | POA: Diagnosis present

## 2019-09-23 DIAGNOSIS — U071 COVID-19: Secondary | ICD-10-CM | POA: Diagnosis not present

## 2019-09-23 DIAGNOSIS — L89893 Pressure ulcer of other site, stage 3: Secondary | ICD-10-CM | POA: Diagnosis present

## 2019-09-23 DIAGNOSIS — R531 Weakness: Secondary | ICD-10-CM

## 2019-09-23 DIAGNOSIS — W3400XA Accidental discharge from unspecified firearms or gun, initial encounter: Secondary | ICD-10-CM

## 2019-09-23 DIAGNOSIS — E785 Hyperlipidemia, unspecified: Secondary | ICD-10-CM | POA: Diagnosis present

## 2019-09-23 DIAGNOSIS — E7800 Pure hypercholesterolemia, unspecified: Secondary | ICD-10-CM | POA: Diagnosis not present

## 2019-09-23 DIAGNOSIS — G8929 Other chronic pain: Secondary | ICD-10-CM | POA: Diagnosis present

## 2019-09-23 DIAGNOSIS — I480 Paroxysmal atrial fibrillation: Secondary | ICD-10-CM | POA: Diagnosis present

## 2019-09-23 DIAGNOSIS — R52 Pain, unspecified: Secondary | ICD-10-CM | POA: Insufficient documentation

## 2019-09-23 DIAGNOSIS — G9341 Metabolic encephalopathy: Secondary | ICD-10-CM | POA: Diagnosis present

## 2019-09-23 DIAGNOSIS — Z955 Presence of coronary angioplasty implant and graft: Secondary | ICD-10-CM | POA: Diagnosis not present

## 2019-09-23 DIAGNOSIS — D72829 Elevated white blood cell count, unspecified: Secondary | ICD-10-CM | POA: Diagnosis present

## 2019-09-23 DIAGNOSIS — E559 Vitamin D deficiency, unspecified: Secondary | ICD-10-CM | POA: Diagnosis present

## 2019-09-23 DIAGNOSIS — I129 Hypertensive chronic kidney disease with stage 1 through stage 4 chronic kidney disease, or unspecified chronic kidney disease: Secondary | ICD-10-CM | POA: Diagnosis present

## 2019-09-23 DIAGNOSIS — S21239D Puncture wound without foreign body of unspecified back wall of thorax without penetration into thoracic cavity, subsequent encounter: Secondary | ICD-10-CM | POA: Diagnosis not present

## 2019-09-23 DIAGNOSIS — N400 Enlarged prostate without lower urinary tract symptoms: Secondary | ICD-10-CM | POA: Diagnosis present

## 2019-09-23 DIAGNOSIS — N184 Chronic kidney disease, stage 4 (severe): Secondary | ICD-10-CM | POA: Diagnosis present

## 2019-09-23 DIAGNOSIS — R17 Unspecified jaundice: Secondary | ICD-10-CM | POA: Diagnosis present

## 2019-09-23 DIAGNOSIS — Z79899 Other long term (current) drug therapy: Secondary | ICD-10-CM | POA: Diagnosis not present

## 2019-09-23 DIAGNOSIS — Z79891 Long term (current) use of opiate analgesic: Secondary | ICD-10-CM

## 2019-09-23 DIAGNOSIS — Z8616 Personal history of COVID-19: Secondary | ICD-10-CM

## 2019-09-23 DIAGNOSIS — S21232D Puncture wound without foreign body of left back wall of thorax without penetration into thoracic cavity, subsequent encounter: Secondary | ICD-10-CM | POA: Diagnosis not present

## 2019-09-23 DIAGNOSIS — Z7902 Long term (current) use of antithrombotics/antiplatelets: Secondary | ICD-10-CM

## 2019-09-23 DIAGNOSIS — M549 Dorsalgia, unspecified: Secondary | ICD-10-CM | POA: Diagnosis not present

## 2019-09-23 DIAGNOSIS — L89313 Pressure ulcer of right buttock, stage 3: Secondary | ICD-10-CM | POA: Diagnosis present

## 2019-09-23 DIAGNOSIS — Z7401 Bed confinement status: Secondary | ICD-10-CM

## 2019-09-23 DIAGNOSIS — M5440 Lumbago with sciatica, unspecified side: Secondary | ICD-10-CM | POA: Diagnosis not present

## 2019-09-23 DIAGNOSIS — Z87891 Personal history of nicotine dependence: Secondary | ICD-10-CM

## 2019-09-23 DIAGNOSIS — L89629 Pressure ulcer of left heel, unspecified stage: Secondary | ICD-10-CM | POA: Diagnosis present

## 2019-09-23 DIAGNOSIS — I10 Essential (primary) hypertension: Secondary | ICD-10-CM | POA: Diagnosis not present

## 2019-09-23 DIAGNOSIS — N3000 Acute cystitis without hematuria: Secondary | ICD-10-CM | POA: Diagnosis not present

## 2019-09-23 DIAGNOSIS — R509 Fever, unspecified: Secondary | ICD-10-CM | POA: Diagnosis present

## 2019-09-23 DIAGNOSIS — G629 Polyneuropathy, unspecified: Secondary | ICD-10-CM | POA: Diagnosis present

## 2019-09-23 DIAGNOSIS — I2510 Atherosclerotic heart disease of native coronary artery without angina pectoris: Secondary | ICD-10-CM | POA: Diagnosis present

## 2019-09-23 DIAGNOSIS — K219 Gastro-esophageal reflux disease without esophagitis: Secondary | ICD-10-CM | POA: Diagnosis present

## 2019-09-23 DIAGNOSIS — W3400XD Accidental discharge from unspecified firearms or gun, subsequent encounter: Secondary | ICD-10-CM | POA: Diagnosis not present

## 2019-09-23 DIAGNOSIS — B961 Klebsiella pneumoniae [K. pneumoniae] as the cause of diseases classified elsewhere: Secondary | ICD-10-CM | POA: Diagnosis present

## 2019-09-23 DIAGNOSIS — N179 Acute kidney failure, unspecified: Secondary | ICD-10-CM | POA: Diagnosis present

## 2019-09-23 DIAGNOSIS — S21239A Puncture wound without foreign body of unspecified back wall of thorax without penetration into thoracic cavity, initial encounter: Secondary | ICD-10-CM

## 2019-09-23 LAB — MAGNESIUM: Magnesium: 2.1 mg/dL (ref 1.7–2.4)

## 2019-09-23 LAB — COMPREHENSIVE METABOLIC PANEL
ALT: 33 U/L (ref 0–44)
AST: 46 U/L — ABNORMAL HIGH (ref 15–41)
Albumin: 2.3 g/dL — ABNORMAL LOW (ref 3.5–5.0)
Alkaline Phosphatase: 34 U/L — ABNORMAL LOW (ref 38–126)
Anion gap: 10 (ref 5–15)
BUN: 47 mg/dL — ABNORMAL HIGH (ref 8–23)
CO2: 24 mmol/L (ref 22–32)
Calcium: 7.9 mg/dL — ABNORMAL LOW (ref 8.9–10.3)
Chloride: 99 mmol/L (ref 98–111)
Creatinine, Ser: 2.19 mg/dL — ABNORMAL HIGH (ref 0.61–1.24)
GFR calc Af Amer: 32 mL/min — ABNORMAL LOW (ref >60)
GFR calc non Af Amer: 28 mL/min — ABNORMAL LOW (ref >60)
Glucose, Bld: 116 mg/dL — ABNORMAL HIGH (ref 70–99)
Potassium: 4.4 mmol/L (ref 3.5–5.1)
Sodium: 133 mmol/L — ABNORMAL LOW (ref 135–145)
Total Bilirubin: 1.1 mg/dL (ref 0.3–1.2)
Total Protein: 5.2 g/dL — ABNORMAL LOW (ref 6.5–8.1)

## 2019-09-23 LAB — LACTIC ACID, PLASMA: Lactic Acid, Venous: 1.3 mmol/L (ref 0.5–1.9)

## 2019-09-23 LAB — CBC
HCT: 34.6 % — ABNORMAL LOW (ref 39.0–52.0)
Hemoglobin: 11.5 g/dL — ABNORMAL LOW (ref 13.0–17.0)
MCH: 31.8 pg (ref 26.0–34.0)
MCHC: 33.2 g/dL (ref 30.0–36.0)
MCV: 95.6 fL (ref 80.0–100.0)
Platelets: 170 10*3/uL (ref 150–400)
RBC: 3.62 MIL/uL — ABNORMAL LOW (ref 4.22–5.81)
RDW: 13.3 % (ref 11.5–15.5)
WBC: 14.5 10*3/uL — ABNORMAL HIGH (ref 4.0–10.5)
nRBC: 0 % (ref 0.0–0.2)

## 2019-09-23 LAB — ABO/RH: ABO/RH(D): A POS

## 2019-09-23 LAB — PROCALCITONIN: Procalcitonin: <0.1 ng/mL

## 2019-09-23 MED ORDER — METOPROLOL SUCCINATE ER 25 MG PO TB24: 12.5000 mg | ORAL_TABLET | Freq: Two times a day (BID) | ORAL | Status: DC

## 2019-09-23 MED ORDER — DOXYCYCLINE HYCLATE 100 MG PO TABS
100.0000 mg | ORAL_TABLET | Freq: Two times a day (BID) | ORAL | Status: DC
  Administered 2019-09-23 (×2): 100 mg via ORAL
  Filled 2019-09-23 (×3): qty 1

## 2019-09-23 MED ORDER — TAMSULOSIN HCL 0.4 MG PO CAPS
0.4000 mg | ORAL_CAPSULE | Freq: Every day | ORAL | Status: DC
  Administered 2019-09-24 – 2019-09-27 (×4): 0.4 mg via ORAL
  Filled 2019-09-23 (×4): qty 1

## 2019-09-23 MED ORDER — FINASTERIDE 5 MG PO TABS
5.0000 mg | ORAL_TABLET | Freq: Every day | ORAL | Status: DC
  Administered 2019-09-24 – 2019-09-27 (×4): 5 mg via ORAL
  Filled 2019-09-23 (×5): qty 1

## 2019-09-23 MED ORDER — ALBUTEROL SULFATE HFA 108 (90 BASE) MCG/ACT IN AERS
2.0000 | INHALATION_SPRAY | Freq: Four times a day (QID) | RESPIRATORY_TRACT | Status: DC | PRN
  Administered 2019-09-25 – 2019-09-27 (×3): 2 via RESPIRATORY_TRACT
  Filled 2019-09-23: qty 6.7

## 2019-09-23 MED ORDER — SODIUM CHLORIDE 0.9 % IV BOLUS
500.0000 mL | Freq: Once | INTRAVENOUS | Status: AC
  Administered 2019-09-23: 500 mL via INTRAVENOUS

## 2019-09-23 MED ORDER — SIMVASTATIN 20 MG PO TABS
10.0000 mg | ORAL_TABLET | Freq: Every day | ORAL | Status: DC
  Administered 2019-09-23 – 2019-09-26 (×4): 10 mg via ORAL
  Filled 2019-09-23 (×4): qty 1

## 2019-09-23 MED ORDER — ONDANSETRON HCL 4 MG/2ML IJ SOLN
4.0000 mg | Freq: Four times a day (QID) | INTRAMUSCULAR | Status: DC | PRN
  Administered 2019-09-26: 4 mg via INTRAVENOUS
  Filled 2019-09-23: qty 2

## 2019-09-23 MED ORDER — SODIUM CHLORIDE 0.9 % IV SOLN: 100.0000 mg | Freq: Every day | INTRAVENOUS | Status: DC

## 2019-09-23 MED ORDER — HYDROCODONE-ACETAMINOPHEN 5-325 MG PO TABS
1.0000 | ORAL_TABLET | ORAL | Status: DC | PRN
  Administered 2019-09-23 – 2019-09-24 (×3): 1 via ORAL
  Filled 2019-09-23 (×3): qty 1

## 2019-09-23 MED ORDER — POLYETHYLENE GLYCOL 3350 17 G PO PACK
17.0000 g | PACK | Freq: Every day | ORAL | Status: DC | PRN
  Administered 2019-09-24: 17 g via ORAL
  Filled 2019-09-23: qty 1

## 2019-09-23 MED ORDER — DILTIAZEM HCL ER 240 MG PO CP24
240.0000 mg | ORAL_CAPSULE | Freq: Every day | ORAL | Status: DC
  Filled 2019-09-23: qty 1

## 2019-09-23 MED ORDER — ACETAMINOPHEN 325 MG PO TABS
650.0000 mg | ORAL_TABLET | Freq: Four times a day (QID) | ORAL | Status: DC | PRN
  Administered 2019-09-24 – 2019-09-25 (×2): 650 mg via ORAL
  Filled 2019-09-23 (×2): qty 2

## 2019-09-23 MED ORDER — PANTOPRAZOLE SODIUM 40 MG PO TBEC
40.0000 mg | DELAYED_RELEASE_TABLET | Freq: Every day | ORAL | Status: DC
  Administered 2019-09-24 – 2019-09-27 (×4): 40 mg via ORAL
  Filled 2019-09-23 (×4): qty 1

## 2019-09-23 MED ORDER — HEPARIN SODIUM (PORCINE) 5000 UNIT/ML IJ SOLN
5000.0000 [IU] | Freq: Three times a day (TID) | INTRAMUSCULAR | Status: DC
  Administered 2019-09-23 – 2019-09-24 (×3): 5000 [IU] via SUBCUTANEOUS
  Filled 2019-09-23 (×3): qty 1

## 2019-09-23 MED ORDER — DEXAMETHASONE 6 MG PO TABS: 6.0000 mg | ORAL_TABLET | ORAL | Status: DC

## 2019-09-23 MED ORDER — SODIUM CHLORIDE 0.9 % IV SOLN: 200.0000 mg | Freq: Once | INTRAVENOUS | Status: DC

## 2019-09-23 MED ORDER — CLOPIDOGREL BISULFATE 75 MG PO TABS
75.0000 mg | ORAL_TABLET | Freq: Every day | ORAL | Status: DC
  Administered 2019-09-23 – 2019-09-27 (×5): 75 mg via ORAL
  Filled 2019-09-23 (×5): qty 1

## 2019-09-23 MED ORDER — AMIODARONE HCL 100 MG PO TABS
200.0000 mg | ORAL_TABLET | Freq: Every day | ORAL | Status: DC
  Administered 2019-09-23 – 2019-09-26 (×4): 200 mg via ORAL
  Filled 2019-09-23 (×5): qty 2

## 2019-09-23 MED ORDER — POLYETHYLENE GLYCOL 3350 17 G PO PACK: 17.0000 g | PACK | Freq: Every day | ORAL | Status: DC | PRN

## 2019-09-23 MED ORDER — NITROGLYCERIN 0.4 MG SL SUBL: 0.4000 mg | SUBLINGUAL_TABLET | SUBLINGUAL | Status: DC | PRN

## 2019-09-23 MED ORDER — ONDANSETRON HCL 4 MG PO TABS: 4.0000 mg | ORAL_TABLET | Freq: Four times a day (QID) | ORAL | Status: DC | PRN

## 2019-09-23 NOTE — Progress Notes (Signed)
PHARMACY - PHYSICIAN COMMUNICATION CRITICAL VALUE ALERT - BLOOD CULTURE IDENTIFICATION (BCID)  Joel Wilson. is an 79 y.o. male who presented to [facility] on 09/22/2019 with a chief complaint of Pain in Buttocks.   Assessment: Possible sepsis due to infected sacral decub ulcer (present on admission) vs UTI. BCX 1 of 4 GPC anaerobic bottle only.    Name of physician (or Provider) Contacted: Dr. Manuella Ghazi   Current antibiotics: Vancomycin and Ceftriaxone   Changes to prescribed antibiotics:  Bcx are most likely represent contaminant. Per MD no changes at this time.    [name], PharmD, BCPS Clinical Pharmacist 09/23/2019 7:38 AM

## 2019-09-23 NOTE — Discharge Summary (Signed)
I rounded on patient in ED. He remains on RA. COVID +  See admitting physician's H & P for details. He's agreeable with transfer to [place] as per off Algorithm request.  Admitted last evening for   Possible sepsis: etiology unclear, infected sacral decub ulcer (present on admission) vs UTI. Blood and urine cxs are pending. continue IV rocephin and vanco.  Possible UTI: UA shows many bacteria, mod leu. Urine cx is pending. Continue on IV rocephin  Possible infected sacral decubitus ulcer: present on admission. Unknown stage. Continue on IV abxs. Wound care consulted. Bed bound for 15 years as per pt  Likely acute metabolic encephalopathy: no hx of dementia per chart review. Oriented to person & place only. Likely secondary to above infections. Continue abxs   AKI on CKD: baseline Cr/GCR unknown. Currently stage IV. Continue on IVFs. Avoid nephrotoxic meds. Will continue to monitor  PAF: will continue on home dose of amiodarone. Continue on tele. Hold home dose of metoprolol, diltiazem for low normal BP  [id number] to hold home dose of metoprolol, lisinopril & lasix secondary to low normal BP   BPH: continue on home dose of finasteride, tamsulosin  Peripheral neuropathy: will continue on gabapentin but at reduced dose secondary to AKI   Hx of CAD: continue on home dose of plavix. Will hold home dose of metoprolol secondary to low normal BP  HLD: will continue on statin   Leukocytosis: likely secondary to infection. Continue on abxs  Transaminitis: etiology unclear. ALT is WNL and AST is elevated. continue to monitor  Hyperbilirubinemia: etiology unclear.continue to monitor   GERD: continue on PPI   Vitamin D deficiency: continue vit D supplement   Time spent: 35 mins

## 2019-09-23 NOTE — Progress Notes (Signed)
Patient with hypotension episodes overnight. HPI notable for UTI and COVID pneumonia.  Review of labs showed probable dehydration with acute on chronic kidney injury.  He was given total of 2 liter normal saline and his MIVF increased. WBC improving. Lactic remains normal

## 2019-09-23 NOTE — Progress Notes (Addendum)
PROGRESS NOTE                                                                                                                                                                                                             Patient Demographics:    Joel Wilson, is a 79 y.o. male, DOB - [birth year], NTZ:[id number]  Outpatient Primary MD for the patient is [facility]    LOS - 0  Admit date - 09/23/2019    CC - transferred from [place] Narrative  79 y/o M w/ PMH of a. Fib, HTN, HLD, BPH, peripheral neuropathy, vitamin D deficiency, GERD who presents from [facility] secondary to buttocks pain x 3 days. The pain is sharp, constant w/ radiation down both legs, pain was thought to be neuropathic in nature he also had a unstageable sacral decubitus ulcer which was present on admission, note patient was found to have COVID-19 infection on 09/04/2019 for which she was fully treated and finished his treatment on 09/08/2019.  He has no pulmonary symptoms whatsoever.  He also had 1 out of 4 falsely positive blood cultures, he was thought to have a infected sacral decubitus ulcer versus UTI.   Subjective:    Joel Wilson today has, No headache, No chest pain, No abdominal pain - No Nausea, No new weakness tingling or numbness, no SOB.    Assessment  & Plan :      1. COVID-19 infection acute- he has mild disease, no pulmonary symptoms, he has finished full treatment for this problem on 09/08/2019.  Encouraged the patient to sit up in chair in the daytime use I-S and flutter valve for pulmonary toiletry and then prone in bed when at night.        Recent Labs  Lab 09/22/19 1418  PROCALCITON <0.10    Hepatic Function Latest Ref Rng & Units 09/23/2019 09/22/2019 09/06/2019  Total Protein 6.5 - 8.1 g/dL 5.2(L) 6.4(L) 5.9(L)  Albumin 3.5 - 5.0 g/dL 2.3(L) 2.8(L) 2.6(L)  AST 15 - 41 U/L 46(H) 43(H) 47(H)  ALT 0  - 44 U/L 33 38 65(H)  Alk Phosphatase 38 - 126 U/L 34(L) 42 54  Total Bilirubin 0.3 - 1.2 mg/dL 1.1 1.5(H) 0.7  Bilirubin, Direct 0.0 - 0.2 mg/dL - - -    2.  Unstageable sacral decubitus ulcer present on admission with possibility of infection around the site.  Currently on doxycycline, blood cultures 1 out of 4 contaminant, procalcitonin negative.  Monitor.  Wound care consulted.  3.  AKI due to dehydration.  Hydrate and monitor.  4.  Chronic peripheral neuropathy, chronic paraplegia, bedbound status for several months prior to that wheelchair-bound status.  Supportive care.  PT OT.  May require placement.  5.  Paroxysmal A. fib.  Mali vas 2 score of at least 3.  Not on anticoagulation.  Defer long-term anticoagulation to his primary cardiologist and PCP, for now continue antiplatelet medications along with amiodarone.  Hold beta-blocker and Cardizem as he is borderline bradycardic with soft blood pressure.  6.  GERD.  PPI.  7.  Chronic neuropathic back pain.  Supportive care.  8.  History of CAD and dyslipidemia.  Stable continue Plavix, statin for now, beta-blocker on hold due to borderline hypotension and relative bradycardia.  9.  COVID-19 viral pneumonia or related mild transaminitis.  Asymptomatic trend.  10.  Leukocytosis.  Combination of steroid use and possibly subacute to chronically infected sacral decubitus ulcer.  Doxycycline and monitor.  Blood cultures repeated, initial blood culture contaminant 1 out of 4, procalcitonin stable.  Nontoxic.     Condition - Fair  Family Communication  :  Son 09/23/19  Code Status :  Full  Diet :   Diet Order            Diet Heart Room service appropriate? Yes; Fluid consistency: Thin  Diet effective now               Disposition Plan  :  TBD  Consults  :  W. Care  Procedures  :    CT - 1. No acute finding by CT. No evidence of diverticulitis or other acute bowel pathology. 2. Aortic atherosclerosis. Coronary artery  calcification. Mitral valve annular calcification. 3. Gallbladder is distended and contains relative hyperdensity dependently. This is probably vicarious excretion of contrast from previous injections. Cannot rule out cholecystitis  PUD Prophylaxis : PPI  DVT Prophylaxis  :   Heparin    Lab Results  Component Value Date   PLT 170 09/23/2019    Inpatient Medications  Scheduled Meds: . amiodarone  200 mg Oral QHS  . clopidogrel  75 mg Oral Daily  . doxycycline  100 mg Oral Q12H  . [START ON 09/24/2019] finasteride  5 mg Oral Daily  . heparin  5,000 Units Subcutaneous Q8H  . [START ON 09/24/2019] pantoprazole  40 mg Oral Daily  . simvastatin  10 mg Oral QHS  . [START ON 09/24/2019] tamsulosin  0.4 mg Oral Daily   Continuous Infusions:  PRN Meds:.acetaminophen, albuterol, nitroGLYCERIN, [DISCONTINUED] ondansetron **OR** ondansetron (ZOFRAN) IV, polyethylene glycol  Antibiotics  :    Anti-infectives (From admission, onward)   Start     Dose/Rate Route Frequency Ordered Stop   09/24/19 1000  remdesivir 100 mg in sodium chloride 0.9 % 100 mL IVPB  Status:  Discontinued     100 mg 200 mL/hr over 30 Minutes Intravenous Daily 09/23/19 1228 09/23/19 1305   09/23/19 1330  doxycycline (VIBRA-TABS) tablet 100 mg     100 mg Oral Every 12 hours 09/23/19 1232     09/23/19 1230  remdesivir 200 mg in sodium chloride 0.9% 250 mL IVPB  Status:  Discontinued     200 mg 580 mL/hr over 30 Minutes Intravenous Once 09/23/19 1228 09/23/19 1305  Time Spent in minutes  30   Lala Lund M.D on 09/23/2019 at 1:17 PM  To page go to [email or web address] - password [facility]  [facility] -  Office  [phone]    See all Orders from today for further details    Objective:   Vitals:   09/23/19 1123  BP: (!) 128/45  Pulse: 71  Resp: 16  Temp: 98.3 F (36.8 C)  TempSrc: Oral  Weight: 84.9 kg  Height: '5\' 11"'$  (1.803 m)    Wt Readings from Last 3 Encounters:  09/23/19 84.9 kg  09/22/19  85.7 kg  09/08/19 85.7 kg    No intake or output data in the 24 hours ending 09/23/19 1317   Physical Exam  Awake Alert, Legs 1/5,  Normal affect, unstageable sacral decubitus ulcer present on admission [place].AT,PERRAL Supple Neck,No JVD, No cervical lymphadenopathy appriciated.  Symmetrical Chest wall movement, Good air movement bilaterally, CTAB RRR,No Gallops,Rubs or new Murmurs, No Parasternal Heave +ve B.Sounds, Abd Soft, No tenderness, No organomegaly appriciated, No rebound - guarding or rigidity. No Cyanosis,      Data Review:    CBC Recent Labs  Lab 09/22/19 1302 09/23/19 0131  WBC 16.5* 14.5*  HGB 13.4 11.5*  HCT 40.7 34.6*  PLT 221 170  MCV 95.3 95.6  MCH 31.4 31.8  MCHC 32.9 33.2  RDW 13.5 13.3  LYMPHSABS 1.8  --   MONOABS 1.3*  --   EOSABS 0.1  --   BASOSABS 0.0  --     Chemistries  Recent Labs  Lab 09/22/19 1302 09/23/19 0131  NA 134* 133*  K 4.8 4.4  CL 94* 99  CO2 27 24  GLUCOSE 114* 116*  BUN 50* 47*  CREATININE 2.70* 2.19*  CALCIUM 8.7* 7.9*  MG  --  2.1  AST 43* 46*  ALT 38 33  ALKPHOS 42 34*  BILITOT 1.5* 1.1   ------------------------------------------------------------------------------------------------------------------ No results for input(s): CHOL, HDL, LDLCALC, TRIG, CHOLHDL, LDLDIRECT in the last 72 hours.  Lab Results  Component Value Date   HGBA1C 5.6 12/28/2016   ------------------------------------------------------------------------------------------------------------------ No results for input(s): TSH, T4TOTAL, T3FREE, THYROIDAB in the last 72 hours.  Invalid input(s): FREET3  Cardiac Enzymes No results for input(s): CKMB, TROPONINI, MYOGLOBIN in the last 168 hours.  Invalid input(s): CK ------------------------------------------------------------------------------------------------------------------    Component Value Date/Time   BNP 62.0 09/04/2019 0032    Micro Results Recent Results (from the past 240  hour(s))  Culture, blood (Routine x 2)     Status: None (Preliminary result)   Collection Time: 09/22/19  1:02 PM   Specimen: BLOOD  Result Value Ref Range Status   Specimen Description   Final    BLOOD Blood Culture results may not be optimal due to an inadequate volume of blood received in culture bottles   Special Requests   Final    BOTTLES DRAWN AEROBIC AND ANAEROBIC BLOOD RIGHT HAND   Culture  Setup Time   Final    GRAM POSITIVE COCCI ANAEROBIC BOTTLE ONLY CRITICAL RESULT CALLED TO, READ BACK BY AND VERIFIED WITH: Dennison Mascot 09/23/19 [place] Performed at [facility], [address] [place], [place] [address]    Culture GRAM POSITIVE COCCI  Final   Report Status PENDING  Incomplete  Culture, blood (Routine x 2)     Status: None (Preliminary result)   Collection Time: 09/22/19  3:42 PM   Specimen: BLOOD  Result Value Ref Range Status   Specimen Description BLOOD BLOOD LEFT FOREARM  Final   Special Requests   Final    BOTTLES DRAWN AEROBIC AND ANAEROBIC Blood Culture adequate volume   Culture   Final    NO GROWTH < 24 HOURS Performed at [facility], [address], [place], [place] [address]    Report Status PENDING  Incomplete    Radiology Reports CT ABDOMEN PELVIS WO CONTRAST  Result Date: 09/22/2019 CLINICAL DATA:  Abdominal pain. Diverticulitis suspected. Hypertension and chest pain. Previous coronavirus infection in December. EXAM: CT CHEST, ABDOMEN AND PELVIS WITHOUT CONTRAST TECHNIQUE: Multidetector CT imaging of the chest, abdomen and pelvis was performed following the standard protocol without IV contrast. COMPARISON:  Chest radiography same day. Chest CT 09/01/2019. Abdominal CT 08/11/2019. FINDINGS: CT CHEST FINDINGS Cardiovascular: Aortic atherosclerosis. Normal heart size. Extensive coronary artery calcification. Mitral valve annular calcification. Aortic root calcification. Mediastinum/Nodes: No mass or adenopathy. Lungs/Pleura:  Scattered mild pulmonary scarring. No sign of active infiltrate, mass, effusion or collapse. Musculoskeletal: Negative CT ABDOMEN PELVIS FINDINGS Hepatobiliary: The liver parenchyma appears normal. Gallbladder is distended and contains relative hyperdensity dependently. This is probably vicarious excretion of contrast from previous injections. Cannot rule out cholecystitis. Pancreas: Normal Spleen: Normal Adrenals/Urinary Tract: Adrenal glands are normal. Kidneys show mild atrophy without focal lesion or obstruction. No bladder lesion. Stomach/Bowel: No acute bowel finding. No sign of obstruction. No inflammatory findings. Minimal diverticulosis without evidence of diverticulitis. Vascular/Lymphatic: Aortic atherosclerosis. No aneurysm. IVC is normal. No retroperitoneal adenopathy. Reproductive: Normal Other: No free fluid or air. Musculoskeletal: Chronic lumbar degenerative changes. IMPRESSION: 1. No acute finding by CT. No evidence of diverticulitis or other acute bowel pathology. 2. Aortic atherosclerosis. Coronary artery calcification. Mitral valve annular calcification. 3. Gallbladder is distended and contains relative hyperdensity dependently. This is probably vicarious excretion of contrast from previous injections. Cannot rule out cholecystitis. Aortic Atherosclerosis (ICD10-I70.0). Electronically Signed   By: Nelson Chimes M.D.   On: 09/22/2019 15:10   DG Chest 2 View  Result Date: 09/22/2019 CLINICAL DATA:  Suspected sepsis. The patient tested positive for COVID-19 09/07/2019. EXAM: CHEST - 2 VIEW COMPARISON:  Single-view of the chest 09/12/2019. CT chest and single view of the chest 09/01/2019. FINDINGS: The lungs are clear. Heart size is normal. Atherosclerosis is seen. No pneumothorax or pleural fluid. No acute or focal bony abnormality. IMPRESSION: No acute disease. Atherosclerosis. Electronically Signed   By: Inge Rise M.D.   On: 09/22/2019 13:37   CT Head Wo Contrast  Result Date:  09/22/2019 CLINICAL DATA:  79 y.o. male with a history of CAD hypertension paroxysmal atrial fibrillation, had Covid in December 2020, who lives at [facility]. EMS was called today due to stroke symptoms and concern about not being out of the left side of his body. However EMS report patient's strength is symmetric on arrival but grossly diminished. EXAM: CT HEAD WITHOUT CONTRAST TECHNIQUE: Contiguous axial images were obtained from the base of the skull through the vertex without intravenous contrast. COMPARISON:  09/01/2019 FINDINGS: Brain: No evidence of acute infarction, hemorrhage, extra-axial collection, ventriculomegaly, or mass effect. Generalized cerebral atrophy. Periventricular white matter low attenuation likely secondary to microangiopathy. Vascular: Cerebrovascular atherosclerotic calcifications are noted. Skull: Negative for fracture or focal lesion. Sinuses/Orbits: Visualized portions of the orbits are unremarkable. Visualized portions of the paranasal sinuses are unremarkable. Visualized portions of the mastoid air cells are unremarkable. Other: None. IMPRESSION: No acute intracranial pathology. Electronically Signed   By: Kathreen Devoid   On: 09/22/2019 15:06   CT Head Wo Contrast  Result  Date: 09/01/2019 CLINICAL DATA:  Tremors for 1 week. EXAM: CT HEAD WITHOUT CONTRAST TECHNIQUE: Contiguous axial images were obtained from the base of the skull through the vertex without intravenous contrast. COMPARISON:  None. FINDINGS: Brain: Stable mild age related cerebral atrophy, ventriculomegaly and periventricular white matter disease. No extra-axial fluid collections are identified. No CT findings for acute hemispheric infarction or intracranial hemorrhage. No mass lesions. The brainstem and cerebellum are normal. Vascular: Mild vascular calcifications. No aneurysm or hyperdense vessels. Skull: No skull fracture or bone lesions. Sinuses/Orbits: The paranasal sinuses and mastoid air cells are  clear. The globes are intact. Other: No scalp lesions or hematoma. IMPRESSION: No acute intracranial findings or mass lesions. Electronically Signed   By: Marijo Sanes M.D.   On: 09/01/2019 17:05   CT Chest Wo Contrast  Result Date: 09/22/2019 CLINICAL DATA:  Abdominal pain. Diverticulitis suspected. Hypertension and chest pain. Previous coronavirus infection in December. EXAM: CT CHEST, ABDOMEN AND PELVIS WITHOUT CONTRAST TECHNIQUE: Multidetector CT imaging of the chest, abdomen and pelvis was performed following the standard protocol without IV contrast. COMPARISON:  Chest radiography same day. Chest CT 09/01/2019. Abdominal CT 08/11/2019. FINDINGS: CT CHEST FINDINGS Cardiovascular: Aortic atherosclerosis. Normal heart size. Extensive coronary artery calcification. Mitral valve annular calcification. Aortic root calcification. Mediastinum/Nodes: No mass or adenopathy. Lungs/Pleura: Scattered mild pulmonary scarring. No sign of active infiltrate, mass, effusion or collapse. Musculoskeletal: Negative CT ABDOMEN PELVIS FINDINGS Hepatobiliary: The liver parenchyma appears normal. Gallbladder is distended and contains relative hyperdensity dependently. This is probably vicarious excretion of contrast from previous injections. Cannot rule out cholecystitis. Pancreas: Normal Spleen: Normal Adrenals/Urinary Tract: Adrenal glands are normal. Kidneys show mild atrophy without focal lesion or obstruction. No bladder lesion. Stomach/Bowel: No acute bowel finding. No sign of obstruction. No inflammatory findings. Minimal diverticulosis without evidence of diverticulitis. Vascular/Lymphatic: Aortic atherosclerosis. No aneurysm. IVC is normal. No retroperitoneal adenopathy. Reproductive: Normal Other: No free fluid or air. Musculoskeletal: Chronic lumbar degenerative changes. IMPRESSION: 1. No acute finding by CT. No evidence of diverticulitis or other acute bowel pathology. 2. Aortic atherosclerosis. Coronary artery  calcification. Mitral valve annular calcification. 3. Gallbladder is distended and contains relative hyperdensity dependently. This is probably vicarious excretion of contrast from previous injections. Cannot rule out cholecystitis. Aortic Atherosclerosis (ICD10-I70.0). Electronically Signed   By: Nelson Chimes M.D.   On: 09/22/2019 15:10   CT Angio Chest PE W and/or Wo Contrast  Result Date: 09/01/2019 CLINICAL DATA:  Short of breath. EXAM: CT ANGIOGRAPHY CHEST WITH CONTRAST TECHNIQUE: Multidetector CT imaging of the chest was performed using the standard protocol during bolus administration of intravenous contrast. Multiplanar CT image reconstructions and MIPs were obtained to evaluate the vascular anatomy. CONTRAST:  108m OMNIPAQUE IOHEXOL 350 MG/ML SOLN COMPARISON:  CT 08/11/2019 FINDINGS: Cardiovascular: No filling defects within the pulmonary arteries to suggest acute pulmonary embolism. Mediastinum/Nodes: No axillary supraclavicular adenopathy. No mediastinal hilar adenopathy. Lungs/Pleura: Bibasilar atelectasis. No pulmonary infarction. No pneumonia evident. Mild ground-glass opacities in the upper lobes in a mosaic pattern. Upper Abdomen: Distension of the gallbladder to 6 cm increased from 5.4 cm on comparison exam. Small amount sludge within the gallbladder. No biliary duct dilatation. Adrenals normal. Musculoskeletal: No aggressive osseous lesion Review of the MIP images confirms the above findings. IMPRESSION: 1. No evidence acute pulmonary embolism. 2. Bilateral atelectasis. 3. Ground-glass opacity upper lobes favored mild edema. Pneumonia not favored. 4. Distension of  the gallbladder increased from prior. Electronically Signed   By: SSuzy BouchardM.D.   On:  09/01/2019 17:12   DG Chest Port 1 View  Result Date: 09/04/2019 CLINICAL DATA:  Initial evaluation for acute respiratory distress. EXAM: PORTABLE CHEST 1 VIEW COMPARISON:  Prior radiograph from 09/01/2019. FINDINGS: Mild cardiomegaly,  stable. Mediastinal silhouette within normal limits. Lungs hypoinflated. Mild diffuse pulmonary interstitial congestion without frank alveolar edema. No pleural effusion. No focal infiltrates or consolidative opacity. No pneumothorax. No acute osseous finding. Degenerative changes noted about the left shoulder. IMPRESSION: 1. Mild diffuse pulmonary interstitial congestion without frank pulmonary edema. 2. No other active cardiopulmonary disease. Electronically Signed   By: Jeannine Boga M.D.   On: 09/04/2019 00:40   DG Chest Port 1 View  Result Date: 09/01/2019 CLINICAL DATA:  Tremors and history of recent COVID-19 exposure EXAM: PORTABLE CHEST 1 VIEW COMPARISON:  08/12/2019 FINDINGS: Cardiac shadow is within normal limits. The lungs are hypoinflated but clear. Previously seen congestive failure has resolved in the interval. No focal infiltrate is seen. No bony abnormality is noted. IMPRESSION: Resolution of previously seen vascular congestion. No focal confluent infiltrate is seen. Electronically Signed   By: Inez Catalina M.D.   On: 09/01/2019 15:46

## 2019-09-23 NOTE — ED Notes (Signed)
Admitting NP aware of BP. Fluid bolus currently being given. Will continue to monitor.

## 2019-09-23 NOTE — ED Notes (Signed)
[facility]  CALLED  FOR  TRANSPORT  TO  [facility]

## 2019-09-23 NOTE — Plan of Care (Signed)
ABX therapy  Vitals check Monitor for signs of sepsis

## 2019-09-23 NOTE — H&P (Signed)
NO CHARGE NOTE   History and Physical    Pj Bartschi. [id number] DOB: Nov 16, 1940 DOA: 09/23/2019  PCP: [facility]  Patient coming from: [facility] Complaint: pain in buttocks  HPI:  79 y/o M w/ PMH of a. Fib, HTN, HLD, BPH, peripheral neuropathy, vitamin D deficiency, GERD who presents from [facility] secondary to buttocks pain x 3 days. The pain is sharp, constant w/ radiation down both legs. Nothing makes the pain better and sitting on his butt makes the pain worse. The severity is currently 9/10. Pt has had above stated pain in the past secondary to sacral decubitus ulcer. Of note, pt c/o dysuria x 1 year and urinary frequency of unknown duration. Pt said he did not tell anyone about the burning with urination. Pt denies any fever, chills, sweating, dizziness/lightheadedness, chest pain, shortness of breath, nausea, vomiting, urinary urgency, diarrhea or constipation. Of note, pt is a poor historian & is alert and oriented to person & place only. Pt stated he has not walked in approx 15 years.   Review of Systems: As per HPI otherwise 10 point review of systems negative.   Past Medical History:  Diagnosis Date  . 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 Coronary artery disease    a. 2003 PCI to Ramus;  b. 11/2012 Cath: LM nl, LAD 30, LCX nl, RI 40 ISR, RCA nl.  . Hereditary and idiopathic peripheral neuropathy   . Hyperlipidemia   . Hypertension   . Hypertensive heart disease    a. 08/2013 Echo: EF 70-75%, mild LVH.  Marland Kitchen Insomnia   . Paroxysmal atrial fibrillation (HCC)    a.08/2013-->amio;  b. CHA2DS2VASc = 4-->no longer on coumadin.  . Vitamin D deficiency     Past Surgical History:  Procedure Laterality Date  . CARDIAC CATHETERIZATION  11/13/12   [facility]; EF >55%  . CARDIAC CATHETERIZATION  2003  . JOINT REPLACEMENT     left hip  . NECK SURGERY       reports that he has quit smoking. He has a 2.00 pack-year  smoking history. He has never used smokeless tobacco. He reports that he does not drink alcohol or use drugs.  Allergies  Allergen Reactions  . Aspirin     Family History  Problem Relation Age of Onset  . Diabetes Neg Hx    No family hx of heart disease. No family hx of cancer   Prior to Admission medications   Medication Sig Start Date End Date Taking? Authorizing Provider  acetaminophen (TYLENOL) 325 MG tablet Take 650 mg by mouth every 6 (six) hours as needed for mild pain or fever.    Yes [provider]  alum & mag hydroxide-simeth ([place]) 200-200-20 MG/5ML suspension Take 30 mLs by mouth every 6 (six) hours as needed for indigestion or heartburn.   Yes [provider]  amiodarone (PACERONE) 200 MG tablet Take 1 tablet (200 mg total) by mouth at bedtime. 09/30/15  Yes Theora Gianotti, NP  Calcium Carbonate-Vitamin D (CALCIUM 600+D) 600-400 MG-UNIT tablet Take 1 tablet by mouth daily.    Yes [provider]  cetirizine (ZYRTEC) 10 MG tablet Take 10 mg by mouth daily.   Yes [provider]  cholecalciferol (VITAMIN D) 1000 UNITS tablet Take 2,000 Units by mouth daily.    Yes [provider]  clopidogrel (PLAVIX) 75 MG tablet Take 75 mg by mouth daily.   Yes [provider]  diltiazem (DILACOR XR) 240 MG 24 hr capsule Take 240 mg by mouth daily.    Yes [provider]  finasteride (PROSCAR) 5 MG tablet Take 5 mg by mouth daily.    Yes [provider]  furosemide (LASIX) 20 MG tablet Take 20 mg by mouth daily.   Yes [provider]  gabapentin (NEURONTIN) 600 MG tablet Take 1,200 mg by mouth 3 (three) times daily.    Yes [provider]  Lidocaine 4 % PTCH Place 1 patch onto the skin daily. (apply to right shoulder)   Yes [provider]  lisinopril (PRINIVIL,ZESTRIL) 10 MG tablet Take 10 mg by mouth daily. (hold if SBP<110)   Yes [provider]  loperamide (IMODIUM) 2  MG capsule Take 2 mg by mouth daily as needed for diarrhea or loose stools.    Yes [provider]  Melatonin 3 MG TABS Take 1 tablet (3 mg total) by mouth at bedtime. 11/08/15  Yes Ngetich, Dinah C, NP  Menthol-Zinc Oxide (CALMOSEPTINE) 0.44-20.6 % OINT Apply 1 application topically daily. (apply to buttocks)   Yes [provider]  metoprolol succinate (TOPROL-XL) 25 MG 24 hr tablet Take 12.5 mg by mouth 2 (two) times daily.   Yes [provider]  neomycin-bacitracin-polymyxin (NEOSPORIN) [phone] ointment Apply 1 application topically daily as needed (tears or abrasions).   Yes [provider]  nitroGLYCERIN (NITROSTAT) 0.4 MG SL tablet Place 0.4 mg under the tongue every 5 (five) minutes as needed for chest pain.   Yes [provider]  nystatin (MYCOSTATIN/NYSTOP) powder Apply 1 application topically 4 (four) times daily. To groin   Yes [provider]  ondansetron (ZOFRAN) 4 MG tablet Take 4 mg by mouth every 6 (six) hours as needed for nausea or vomiting.    Yes [provider]  Oxycodone HCl 20 MG TABS Take 0.5 tablets (10 mg total) by mouth 3 (three) times daily. 08/15/19  Yes Dhungel, Nishant, MD  pantoprazole (PROTONIX) 40 MG tablet Take 1 tablet (40 mg total) by mouth daily. 09/08/19  Yes Sreenath, Sudheer B, MD  polyethylene glycol (MIRALAX / GLYCOLAX) 17 g packet Take 17 g by mouth daily as needed for mild constipation.   Yes [provider]  senna (SENOKOT) 8.6 MG tablet Take 2 tablets by mouth daily.    Yes [provider]  simvastatin (ZOCOR) 10 MG tablet Take 10 mg by mouth at bedtime.   Yes [provider]  tamsulosin (FLOMAX) 0.4 MG CAPS Take 0.4 mg by mouth daily.    Yes [provider]  triamcinolone ointment (KENALOG) 0.1 % Apply 1 application topically 2 (two) times daily. (apply to feet, lower legs and ankles)   Yes [provider]  vitamin B-12 (CYANOCOBALAMIN) 1000 MCG  tablet Take 1,000 mcg by mouth daily.   Yes [provider]    Physical Exam: Vitals:   09/23/19 1123  BP: (!) 128/45  Pulse: 71  Resp: 16  Temp: 98.3 F (36.8 C)  TempSrc: Oral  Weight: 84.9 kg  Height: 5\' 11"  (1.803 m)    Constitutional: NAD, calm, comfortable Vitals:   09/23/19 1123  BP: (!) 128/45  Pulse: 71  Resp: 16  Temp: 98.3 F (36.8 C)  TempSrc: Oral  Weight: 84.9 kg  Height: 5\' 11"  (1.803 m)   Eyes: PERRL, lids and conjunctivae normal.  ENMT: Mucous membranes are dry. Posterior pharynx clear of any exudate. Poor dentition, missing multiple teeth.  Neck: normal, supple,  Respiratory: decreased breath sounds b/l. No wheezes, rales  Cardiovascular: S1, S2 +. No rubs / gallops. No extremity edema.  Abdomen: soft, no tenderness, non-distended. Bowel sounds positive.  Musculoskeletal: no clubbing / cyanosis. Poor muscle tone. Decreased ROM of b/l LE in all direction. Frail appearing  Skin: sacral decubitus ulcer, erythematous, unknown stage  Neurologic: Moves all 4 extremities. Decreased strength in b/l LE & b/l UE Psychiatric: Abnormal judgment and insight. Alert and oriented x person & place onely. Flat mood and affect.     Labs on Admission: I have personally reviewed following labs and imaging studies  CBC: Recent Labs  Lab 09/22/19 1302 09/23/19 0131  WBC 16.5* 14.5*  NEUTROABS 13.2*  --   HGB 13.4 11.5*  HCT 40.7 34.6*  MCV 95.3 95.6  PLT 221 [id number]   Basic Metabolic Panel: Recent Labs  Lab 09/22/19 1302 09/23/19 0131  NA 134* 133*  K 4.8 4.4  CL 94* 99  CO2 27 24  GLUCOSE 114* 116*  BUN 50* 47*  CREATININE 2.70* 2.19*  CALCIUM 8.7* 7.9*  MG  --  2.1   GFR: Estimated Creatinine Clearance: 29.1 mL/min (A) (by C-G formula based on SCr of 2.19 mg/dL (H)). Liver Function Tests: Recent Labs  Lab 09/22/19 1302 09/23/19 0131  AST 43* 46*  ALT 38 33  ALKPHOS 42 34*  BILITOT 1.5* 1.1  PROT 6.4* 5.2*  ALBUMIN 2.8* 2.3*   No  results for input(s): LIPASE, AMYLASE in the last 168 hours. No results for input(s): AMMONIA in the last 168 hours. Coagulation Profile: Recent Labs  Lab 09/22/19 1302  INR 1.1   Cardiac Enzymes: No results for input(s): CKTOTAL, CKMB, CKMBINDEX, TROPONINI in the last 168 hours. BNP (last 3 results) No results for input(s): PROBNP in the last 8760 hours. HbA1C: No results for input(s): HGBA1C in the last 72 hours. CBG: No results for input(s): GLUCAP in the last 168 hours. Lipid Profile: No results for input(s): CHOL, HDL, LDLCALC, TRIG, CHOLHDL, LDLDIRECT in the last 72 hours. Thyroid Function Tests: No results for input(s): TSH, T4TOTAL, FREET4, T3FREE, THYROIDAB in the last 72 hours. Anemia Panel: No results for input(s): VITAMINB12, FOLATE, FERRITIN, TIBC, IRON, RETICCTPCT in the last 72 hours. Urine analysis:    Component Value Date/Time   COLORURINE YELLOW (A) 09/22/2019 1542   APPEARANCEUR CLOUDY (A) 09/22/2019 1542   APPEARANCEUR Clear 08/29/2013 0454   LABSPEC 1.013 09/22/2019 1542   LABSPEC 1.019 08/29/2013 0454   PHURINE 5.0 09/22/2019 1542   GLUCOSEU NEGATIVE 09/22/2019 1542   GLUCOSEU Negative 08/29/2013 0454   HGBUR MODERATE (A) 09/22/2019 1542   BILIRUBINUR NEGATIVE 09/22/2019 1542   BILIRUBINUR Negative 08/29/2013 0454   KETONESUR NEGATIVE 09/22/2019 1542   PROTEINUR NEGATIVE 09/22/2019 1542   NITRITE NEGATIVE 09/22/2019 1542   LEUKOCYTESUR MODERATE (A) 09/22/2019 1542   LEUKOCYTESUR Negative 08/29/2013 0454    Radiological Exams on Admission: CT ABDOMEN PELVIS WO CONTRAST  Result Date: 09/22/2019 CLINICAL DATA:  Abdominal pain. Diverticulitis suspected. Hypertension and chest pain. Previous coronavirus infection in December. EXAM: CT CHEST, ABDOMEN AND PELVIS WITHOUT CONTRAST TECHNIQUE: Multidetector CT imaging of the chest, abdomen and pelvis was performed following the standard protocol without IV contrast. COMPARISON:  Chest radiography same day.  Chest CT 09/01/2019. Abdominal CT 08/11/2019. FINDINGS: CT CHEST FINDINGS Cardiovascular: Aortic atherosclerosis. Normal heart size. Extensive coronary artery calcification. Mitral valve annular calcification. Aortic root calcification. Mediastinum/Nodes: No mass or adenopathy. Lungs/Pleura: Scattered mild pulmonary scarring. No sign of active infiltrate, mass,  effusion or collapse. Musculoskeletal: Negative CT ABDOMEN PELVIS FINDINGS Hepatobiliary: The liver parenchyma appears normal. Gallbladder is distended and contains relative hyperdensity dependently. This is probably vicarious excretion of contrast from previous injections. Cannot rule out cholecystitis. Pancreas: Normal Spleen: Normal Adrenals/Urinary Tract: Adrenal glands are normal. Kidneys show mild atrophy without focal lesion or obstruction. No bladder lesion. Stomach/Bowel: No acute bowel finding. No sign of obstruction. No inflammatory findings. Minimal diverticulosis without evidence of diverticulitis. Vascular/Lymphatic: Aortic atherosclerosis. No aneurysm. IVC is normal. No retroperitoneal adenopathy. Reproductive: Normal Other: No free fluid or air. Musculoskeletal: Chronic lumbar degenerative changes. IMPRESSION: 1. No acute finding by CT. No evidence of diverticulitis or other acute bowel pathology. 2. Aortic atherosclerosis. Coronary artery calcification. Mitral valve annular calcification. 3. Gallbladder is distended and contains relative hyperdensity dependently. This is probably vicarious excretion of contrast from previous injections. Cannot rule out cholecystitis. Aortic Atherosclerosis (ICD10-I70.0). Electronically Signed   By: Nelson Chimes M.D.   On: 09/22/2019 15:10   DG Chest 2 View  Result Date: 09/22/2019 CLINICAL DATA:  Suspected sepsis. The patient tested positive for COVID-19 09/07/2019. EXAM: CHEST - 2 VIEW COMPARISON:  Single-view of the chest 09/12/2019. CT chest and single view of the chest 09/01/2019. FINDINGS: The lungs  are clear. Heart size is normal. Atherosclerosis is seen. No pneumothorax or pleural fluid. No acute or focal bony abnormality. IMPRESSION: No acute disease. Atherosclerosis. Electronically Signed   By: Inge Rise M.D.   On: 09/22/2019 13:37   CT Head Wo Contrast  Result Date: 09/22/2019 CLINICAL DATA:  79 y.o. male with a history of CAD hypertension paroxysmal atrial fibrillation, had Covid in December 2020, who lives at [facility]. EMS was called today due to stroke symptoms and concern about not being out of the left side of his body. However EMS report patient's strength is symmetric on arrival but grossly diminished. EXAM: CT HEAD WITHOUT CONTRAST TECHNIQUE: Contiguous axial images were obtained from the base of the skull through the vertex without intravenous contrast. COMPARISON:  09/01/2019 FINDINGS: Brain: No evidence of acute infarction, hemorrhage, extra-axial collection, ventriculomegaly, or mass effect. Generalized cerebral atrophy. Periventricular white matter low attenuation likely secondary to microangiopathy. Vascular: Cerebrovascular atherosclerotic calcifications are noted. Skull: Negative for fracture or focal lesion. Sinuses/Orbits: Visualized portions of the orbits are unremarkable. Visualized portions of the paranasal sinuses are unremarkable. Visualized portions of the mastoid air cells are unremarkable. Other: None. IMPRESSION: No acute intracranial pathology. Electronically Signed   By: Kathreen Devoid   On: 09/22/2019 15:06   CT Chest Wo Contrast  Result Date: 09/22/2019 CLINICAL DATA:  Abdominal pain. Diverticulitis suspected. Hypertension and chest pain. Previous coronavirus infection in December. EXAM: CT CHEST, ABDOMEN AND PELVIS WITHOUT CONTRAST TECHNIQUE: Multidetector CT imaging of the chest, abdomen and pelvis was performed following the standard protocol without IV contrast. COMPARISON:  Chest radiography same day. Chest CT 09/01/2019. Abdominal CT 08/11/2019.  FINDINGS: CT CHEST FINDINGS Cardiovascular: Aortic atherosclerosis. Normal heart size. Extensive coronary artery calcification. Mitral valve annular calcification. Aortic root calcification. Mediastinum/Nodes: No mass or adenopathy. Lungs/Pleura: Scattered mild pulmonary scarring. No sign of active infiltrate, mass, effusion or collapse. Musculoskeletal: Negative CT ABDOMEN PELVIS FINDINGS Hepatobiliary: The liver parenchyma appears normal. Gallbladder is distended and contains relative hyperdensity dependently. This is probably vicarious excretion of contrast from previous injections. Cannot rule out cholecystitis. Pancreas: Normal Spleen: Normal Adrenals/Urinary Tract: Adrenal glands are normal. Kidneys show mild atrophy without focal lesion or obstruction. No bladder lesion. Stomach/Bowel: No acute bowel finding. No sign  of obstruction. No inflammatory findings. Minimal diverticulosis without evidence of diverticulitis. Vascular/Lymphatic: Aortic atherosclerosis. No aneurysm. IVC is normal. No retroperitoneal adenopathy. Reproductive: Normal Other: No free fluid or air. Musculoskeletal: Chronic lumbar degenerative changes. IMPRESSION: 1. No acute finding by CT. No evidence of diverticulitis or other acute bowel pathology. 2. Aortic atherosclerosis. Coronary artery calcification. Mitral valve annular calcification. 3. Gallbladder is distended and contains relative hyperdensity dependently. This is probably vicarious excretion of contrast from previous injections. Cannot rule out cholecystitis. Aortic Atherosclerosis (ICD10-I70.0). Electronically Signed   By: Nelson Chimes M.D.   On: 09/22/2019 15:10    EKG: Independently reviewed  Assessment/Plan Active Problems:   COVID-19 virus infection   Possible sepsis: etiology unclear, infected sacral decub ulcer (present on admission) vs UTI. Blood and urine cxs are pending. Will continue IV rocephin and vanco.  Possible UTI: UA shows many bacteria, mod leu.  Urine cx is pending. Continue on IV rocephin  Possible infected sacral decubitus ulcer: present on admission. Unknown stage. Continue on IV abxs. Wound care consulted. Bed bound for 15 years as per pt  Likely acute metabolic encephalopathy: no hx of dementia as per ER doc. Oriented to person & place only. Likely secondary to above infections. 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 Continue on IVFs. Avoid nephrotoxic meds. Will continue to monitor  PAF: will continue on home dose of amiodarone. Continue on tele. Hold home dose of metoprolol, diltiazem for low normal BP  HTN: will continue to hold home dose of metoprolol, lisinopril & lasix secondary to low normal BP   BPH: will continue on home dose of finasteride, tamsulosin  Peripheral neuropathy: will continue on gabapentin but at reduced dose secondary to AKI   Hx of CAD: will continue on home dose of plavix. Will hold home dose of metoprolol secondary to low normal BP  HLD: will continue on statin   Leukocytosis: likely secondary to infection. Continue on abxs  Transaminitis: etiology unclear. ALT is WNL and AST is elevated. Will continue to monitor  Hyperbilirubinemia: etiology unclear. Will continue to monitor   GERD: continue on PPI   Vitamin D deficiency: continue vit D supplement   DVT prophylaxis: heparin Code Status: full Family Communication:  Disposition Plan:  Consults called:  Admission status: inpatient    Lala Lund MD [facility] Pager [phone]   If 7PM-7AM, please contact night-coverage [email or web address] Password [facility]  09/23/2019, 12:35 PM

## 2019-09-23 NOTE — Consult Note (Signed)
[facility] Nurse Consult Note: Patient receiving care in [place] and is COVID+.  I sent the following SecureChat message to the primary RN, Aldona Bar Brinson, "Hi Samantha, If you will take a photo of the sacral wound and upload it to the EMR via ROVER on the iPhone, I can order a treatment for it. Page me if you need to , [phone]. Judeen Hammans, [facility] nurse" which she has acknowledged. 09/24/19 Consult is completed now as photos of wounds are in the record.  Consult completed remotely after review of record and images. Reason for Consult: "sacral decub".  Once a photo is in the record a treatment can be initiated. Wound type: Patient was admitted with an extensive sacral/coccyx/buttock wound that appears to have started as a DTPI and is evolving into an unstageable wound.  Expect this wound to continue to evolve and the appearance to worsen.  He was also admitted with developing eschar to the right medial and lateral heel areas, these are unstageable wounds. Pressure Injury POA: Yes Measurement: To be provided by the bedside RN in the flowsheet section Wound bed: See photos of areas Drainage (amount, consistency, odor) none Periwound: intact Dressing procedure/placement/frequency: For the heels, apply iodine from the swab sticks or swab pads available in clean utility. Allow to air dry. Place feet into Prevalon heel lift boots.  For the sacrum, Apply Santyl  in a nickel thick layer. Cover with a saline moistened gauze, then dry gauze or ABD pad.  Change daily. Additional items ordered were for bilateral Prevalon heel lift boots. And, an air mattress with turning instructions:  Turn patient right or left; avoid pressure to the sacrum as much as possible, he has a pressure injury there. Monitor the wound area(s) for worsening of condition such as: Signs/symptoms of infection,  Increase in size,  Development of or worsening of odor, Development of pain, or increased pain at the affected locations.  Notify the  medical team if any of these develop.  Thank you for the consult. [facility] nurse will not follow at this time.  Please re-consult the [facility] team if needed.  Val Riles, RN, MSN, CWOCN, CNS-BC, pager [phone]

## 2019-09-24 DIAGNOSIS — I10 Essential (primary) hypertension: Secondary | ICD-10-CM | POA: Diagnosis present

## 2019-09-24 DIAGNOSIS — I2510 Atherosclerotic heart disease of native coronary artery without angina pectoris: Secondary | ICD-10-CM | POA: Diagnosis present

## 2019-09-24 DIAGNOSIS — E7800 Pure hypercholesterolemia, unspecified: Secondary | ICD-10-CM

## 2019-09-24 DIAGNOSIS — M549 Dorsalgia, unspecified: Secondary | ICD-10-CM

## 2019-09-24 DIAGNOSIS — G8929 Other chronic pain: Secondary | ICD-10-CM | POA: Diagnosis present

## 2019-09-24 DIAGNOSIS — E785 Hyperlipidemia, unspecified: Secondary | ICD-10-CM | POA: Diagnosis present

## 2019-09-24 DIAGNOSIS — D72829 Elevated white blood cell count, unspecified: Secondary | ICD-10-CM

## 2019-09-24 DIAGNOSIS — I480 Paroxysmal atrial fibrillation: Secondary | ICD-10-CM

## 2019-09-24 LAB — CBC WITH DIFFERENTIAL/PLATELET
Abs Immature Granulocytes: 0.08 10*3/uL — ABNORMAL HIGH (ref 0.00–0.07)
Basophils Absolute: 0 10*3/uL (ref 0.0–0.1)
Basophils Relative: 0 %
Eosinophils Absolute: 0.2 10*3/uL (ref 0.0–0.5)
Eosinophils Relative: 2 %
HCT: 36.5 % — ABNORMAL LOW (ref 39.0–52.0)
Hemoglobin: 12 g/dL — ABNORMAL LOW (ref 13.0–17.0)
Immature Granulocytes: 1 %
Lymphocytes Relative: 10 %
Lymphs Abs: 1.3 10*3/uL (ref 0.7–4.0)
MCH: 31.7 pg (ref 26.0–34.0)
MCHC: 32.9 g/dL (ref 30.0–36.0)
MCV: 96.6 fL (ref 80.0–100.0)
Monocytes Absolute: 1.2 10*3/uL — ABNORMAL HIGH (ref 0.1–1.0)
Monocytes Relative: 9 %
Neutro Abs: 11.1 10*3/uL — ABNORMAL HIGH (ref 1.7–7.7)
Neutrophils Relative %: 78 %
Platelets: 175 10*3/uL (ref 150–400)
RBC: 3.78 MIL/uL — ABNORMAL LOW (ref 4.22–5.81)
RDW: 13.3 % (ref 11.5–15.5)
WBC: 14 10*3/uL — ABNORMAL HIGH (ref 4.0–10.5)
nRBC: 0 % (ref 0.0–0.2)

## 2019-09-24 LAB — COMPREHENSIVE METABOLIC PANEL
ALT: 32 U/L (ref 0–44)
AST: 34 U/L (ref 15–41)
Albumin: 2.4 g/dL — ABNORMAL LOW (ref 3.5–5.0)
Alkaline Phosphatase: 41 U/L (ref 38–126)
Anion gap: 10 (ref 5–15)
BUN: 31 mg/dL — ABNORMAL HIGH (ref 8–23)
CO2: 24 mmol/L (ref 22–32)
Calcium: 8.4 mg/dL — ABNORMAL LOW (ref 8.9–10.3)
Chloride: 103 mmol/L (ref 98–111)
Creatinine, Ser: 1.32 mg/dL — ABNORMAL HIGH (ref 0.61–1.24)
GFR calc Af Amer: 59 mL/min — ABNORMAL LOW (ref >60)
GFR calc non Af Amer: 51 mL/min — ABNORMAL LOW (ref >60)
Glucose, Bld: 99 mg/dL (ref 70–99)
Potassium: 4.2 mmol/L (ref 3.5–5.1)
Sodium: 137 mmol/L (ref 135–145)
Total Bilirubin: 1.2 mg/dL (ref 0.3–1.2)
Total Protein: 5.5 g/dL — ABNORMAL LOW (ref 6.5–8.1)

## 2019-09-24 LAB — BRAIN NATRIURETIC PEPTIDE: B Natriuretic Peptide: 62.6 pg/mL (ref 0.0–100.0)

## 2019-09-24 LAB — PROCALCITONIN: Procalcitonin: <0.1 ng/mL

## 2019-09-24 LAB — D-DIMER, QUANTITATIVE: D-Dimer, Quant: 1.13 ug/mL-FEU — ABNORMAL HIGH (ref 0.00–0.50)

## 2019-09-24 LAB — MAGNESIUM: Magnesium: 1.9 mg/dL (ref 1.7–2.4)

## 2019-09-24 LAB — C-REACTIVE PROTEIN: CRP: 12.9 mg/dL — ABNORMAL HIGH (ref <1.0)

## 2019-09-24 LAB — FERRITIN: Ferritin: 607 ng/mL — ABNORMAL HIGH (ref 24–336)

## 2019-09-24 MED ORDER — DOXYCYCLINE HYCLATE 100 MG PO TABS
100.0000 mg | ORAL_TABLET | Freq: Two times a day (BID) | ORAL | Status: DC
  Administered 2019-09-24 – 2019-09-27 (×7): 100 mg via ORAL
  Filled 2019-09-24 (×9): qty 1

## 2019-09-24 MED ORDER — HEPARIN SODIUM (PORCINE) 5000 UNIT/ML IJ SOLN
5000.0000 [IU] | Freq: Three times a day (TID) | INTRAMUSCULAR | Status: DC
  Administered 2019-09-24 – 2019-09-27 (×9): 5000 [IU] via SUBCUTANEOUS
  Filled 2019-09-24 (×9): qty 1

## 2019-09-24 MED ORDER — HEPARIN (PORCINE) 25000 UT/250ML-% IV SOLN: 1000.0000 [IU]/h | INTRAVENOUS | Status: DC

## 2019-09-24 MED ORDER — COLLAGENASE 250 UNIT/GM EX OINT
TOPICAL_OINTMENT | Freq: Every day | CUTANEOUS | Status: DC
  Filled 2019-09-24 (×2): qty 30

## 2019-09-24 MED ORDER — ENSURE ENLIVE PO LIQD
237.0000 mL | Freq: Three times a day (TID) | ORAL | Status: DC
  Administered 2019-09-24 – 2019-09-27 (×6): 237 mL via ORAL

## 2019-09-24 MED ORDER — BISACODYL 5 MG PO TBEC
5.0000 mg | DELAYED_RELEASE_TABLET | Freq: Every day | ORAL | Status: DC
  Administered 2019-09-27: 5 mg via ORAL
  Filled 2019-09-24 (×3): qty 1

## 2019-09-24 MED ORDER — OXYCODONE HCL 5 MG PO TABS
10.0000 mg | ORAL_TABLET | Freq: Three times a day (TID) | ORAL | Status: DC
  Administered 2019-09-24 (×2): 10 mg via ORAL
  Filled 2019-09-24 (×2): qty 2

## 2019-09-24 MED ORDER — GABAPENTIN 300 MG PO CAPS
300.0000 mg | ORAL_CAPSULE | Freq: Three times a day (TID) | ORAL | Status: DC
  Administered 2019-09-24 – 2019-09-27 (×9): 300 mg via ORAL
  Filled 2019-09-24 (×9): qty 1

## 2019-09-24 MED ORDER — ADULT MULTIVITAMIN W/MINERALS CH
1.0000 | ORAL_TABLET | Freq: Every day | ORAL | Status: DC
  Administered 2019-09-24 – 2019-09-27 (×4): 1 via ORAL
  Filled 2019-09-24 (×4): qty 1

## 2019-09-24 MED ORDER — HEPARIN BOLUS VIA INFUSION
2000.0000 [IU] | Freq: Once | INTRAVENOUS | Status: DC
  Filled 2019-09-24: qty 2000

## 2019-09-24 MED ORDER — SODIUM CHLORIDE 0.9 % IV SOLN
1.0000 g | INTRAVENOUS | Status: DC
  Administered 2019-09-24 – 2019-09-26 (×3): 1 g via INTRAVENOUS
  Filled 2019-09-24 (×4): qty 10

## 2019-09-24 NOTE — Progress Notes (Addendum)
Initial Nutrition Assessment RD working remotely.  DOCUMENTATION CODES:   Not applicable  INTERVENTION:   Ensure Enlive po TID, each supplement provides 350 kcal and 20 grams of protein  MVI with minerals daily  Diet liberalization to regular  Pt receiving Hormel Shake daily with Breakfast which provides 520 kcals and 22 g of protein and Magic cup BID with lunch and dinner, each supplement provides 290 kcal and 9 grams of protein, automatically on meal trays to optimize nutritional intake.   NUTRITION DIAGNOSIS:   Increased nutrient needs related to wound healing, acute illness(COVID-19) as evidenced by estimated needs.  GOAL:   Patient will meet greater than or equal to 90% of their needs  MONITOR:   PO intake, Supplement acceptance, Labs, Skin  REASON FOR ASSESSMENT:   Malnutrition Screening Tool    ASSESSMENT:   79 yo male admitted with buttocks pain x 3 days, infected sacral decubitus ulcer. COVID positive on 1/1. Finished COVID treatment on 1/5. PMH includes A fib, HTN, HLD, BPH, peripheral neuropathy, vitamin D deficiency, GERD.    Patient is on a heart healthy diet, no meal intakes recorded. Labs reviewed.  Medications reviewed and include flomax.  Usual weights reviewed. Patient weighed 99.8 kg on 08/10/19, down to 84.9 kg on admission. 15% weight loss within 3 months is significant for the time frame.   Suspect patient is malnourished. Unable to obtain enough information at this time for identification of malnutrition.   Patient would benefit from PO supplements and diet liberalization to ensure adequate intake to meet increased nutrition needs for COVID-19 and wound healing.  NUTRITION - FOCUSED PHYSICAL EXAM:  unable to complete due to COVID restrictions  Diet Order:   Diet Order            Diet regular Room service appropriate? Yes; Fluid consistency: Thin  Diet effective now              EDUCATION NEEDS:   Not appropriate for education at  this time  Skin:  Skin Assessment: Skin Integrity Issues: Skin Integrity Issues:: Stage III, Unstageable Stage III: R & L buttocks and anus Unstageable: Heels  Last BM:  1/21 type 2  Height:   Ht Readings from Last 1 Encounters:  09/23/19 5\' 11"  (1.803 m)    Weight:   Wt Readings from Last 1 Encounters:  09/23/19 84.9 kg    Ideal Body Weight:  78.2 kg  BMI:  Body mass index is 26.11 kg/m.  Estimated Nutritional Needs:   Kcal:  2200-2500  Protein:  110-130 gm  Fluid:  >/= 2.2 L    Molli Barrows, RD, LDN, [place] Pager [phone] After Hours Pager [phone]

## 2019-09-24 NOTE — Consult Note (Signed)
WOC Nurse Consult Note: Consult orders for wound care entered first thing this morning.  I explained to Dr. Sherral Hammers via a phone conversation that the [facility] team does not schedule outpatient wound care center appointments.  Hopefully the discharge planner will be able to help with this need. Re-consult the [facility] team if needed.  Val Riles, RN, MSN, CWOCN, CNS-BC, pager [phone]

## 2019-09-24 NOTE — Progress Notes (Signed)
OT Cancellation Note  Patient Details Name: Joel Wilson. MRN: [id number] DOB: December 19, 1940   Cancelled Treatment:    Reason Eval/Treat Not Completed: Other (comment). Pt from SNF. No current respiratory issues per chart review. Plan to return to SNF. Defer OT needs to SNF. If OT eval needed, please message OT or call [phone]. Thanks  [facility] 09/24/2019, 2:51 PM  Maurie Boettcher, OT/L   Acute OT Clinical Specialist Acute Rehabilitation Services Pager [phone] Office [phone]

## 2019-09-24 NOTE — Progress Notes (Signed)
PROGRESS NOTE    Joel Wilson.  [id number] DOB: 1941-06-16 DOA: 09/23/2019 PCP: [facility]   Brief Narrative:  79 y/o WM PMHx paroxysmal A. Fib, HTN, HLD, BPH, peripheral neuropathy, vitamin D deficiency, GERD  Presents from  health care secondary to buttocks pain x 3 days. The pain is sharp, constant w/ radiation down both legs, pain was thought to be neuropathic in nature he also had a unstageable sacral decubitus ulcer which was present on admission, note patient was found to have COVID-19 infection on 09/04/2019 for which he was fully treated and finished his treatment on 09/08/2019.  He has no pulmonary symptoms whatsoever.  He also had 1 out of 4 falsely positive blood cultures, he was thought to have a infected sacral decubitus ulcer versus UTI.   Subjective: A/O x4, negative S OB, negative CP, negative abdominal pain.  Positive sacral decubitus pain.   Assessment & Plan:   Active Problems:   COVID-19 virus infection   AF (paroxysmal atrial fibrillation) (HCC)   Essential hypertension   HLD (hyperlipidemia)   Chronic neuropathic back pain   CAD (coronary artery disease)   Leukocytosis   Covid pneumonia/acute respiratory failure with hypoxia COVID-19 Labs  Recent Labs    09/24/19 0125  DDIMER 1.13*  CRP 12.9*    Lab Results  Component Value Date   SARSCOV2NAA POSITIVE (A) 09/07/2019   SARSCOV2NAA POSITIVE (A) 09/01/2019   [place] NEGATIVE 08/14/2019   [place] NEGATIVE 08/11/2019   AKI -Multifactorial dehydration and UTI  Recent Labs  Lab 09/22/19 1302 09/23/19 0131 09/24/19 0125  CREATININE 2.70* 2.19* 1.32*  -Strict in and out -  UTI positive GNR -1/21 start empiric ceftriaxone until speciation  Paroxysmal atrial fibrillation -CHADVASc score3+ -Currently rate controlled -Amiodarone 200 mg daily -Hold all other cardiac medications secondary to minimal BP -Patient not on anticoagulant at home, will need to  address with his cardiologist on discharge -While inpatient Heparin per pharmacy.  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 -Continue current pain medication, will titrate up as required  Leukocytosis -Multifactorial, UTI, infected sacral decubitus ulcer. -Patient currently on antibiotics monitor closely -1/22 procalcitonin pending  Stage III sacral decubitus ulcer  Pressure Injury 08/12/19 Buttocks Left Stage III -  Full thickness tissue loss. Subcutaneous fat may be visible but bone, tendon or muscle are NOT exposed. (Active)  08/12/19 0008  Location: Buttocks  Location Orientation: Left  Staging: Stage III -  Full thickness tissue loss. Subcutaneous fat may be visible but bone, tendon or muscle are NOT exposed.  Wound Description (Comments):   Present on Admission: Yes     Pressure Injury 08/12/19 Buttocks Right Stage III -  Full thickness tissue loss. Subcutaneous fat may be visible but bone, tendon or muscle are NOT exposed. (Active)  08/12/19 0009  Location: Buttocks  Location Orientation: Right  Staging: Stage III -  Full thickness tissue loss. Subcutaneous fat may be visible but bone, tendon or muscle are NOT exposed.  Wound Description (Comments):   Present on Admission: Yes     Pressure Injury 09/23/19 Heel Right Unstageable - Full thickness tissue loss in which the base of the ulcer is covered by slough (yellow, tan, gray, green or brown) and/or eschar (tan, brown or black) in the wound bed. (Active)  09/23/19 2140  Location: Heel  Location Orientation: Right  Staging: Unstageable - Full thickness tissue loss in which the base of the ulcer is covered by slough (yellow, tan, gray, green or brown) and/or eschar (tan, brown or black) in the wound bed.   Wound Description (Comments):   Present on Admission: Yes     Pressure Injury 09/04/19 Heel Left (Active)  09/04/19 0815  Location: Heel  Location Orientation: Left  Staging:   Wound Description (Comments):   Present on Admission: Yes     Pressure Injury 09/23/19 Anus Medial Stage 3 -  Full thickness tissue loss. Subcutaneous fat may be visible but bone, tendon or muscle are NOT exposed. (Active)  09/23/19 1543  Location: Anus  Location Orientation: Medial  Staging: Stage 3 -  Full thickness tissue loss. Subcutaneous fat may be visible but bone, tendon or muscle are NOT exposed.  Wound Description (Comments):   Present on Admission: Yes  -Continue doxycycline p.o. 100 mg BID -1/21 consult to wound care.  Requests recommendations on care of wound as inpatient, as well as follow-up appointments for wound care as outpatient       DVT prophylaxis: Subcu heparin Code Status: Full Family Communication:  Disposition Plan: TBD   Consultants:  Wound care pending  Procedures/Significant Events:     I have personally reviewed and interpreted all radiology studies and my findings are as above.  VENTILATOR SETTINGS: Room air 1/21 SPO2; 96%   Cultures 1/4 SARS coronavirus positive 19 urine positive GNR   Antimicrobials: Anti-infectives (From admission, onward)   Start     Dose/Rate Stop   09/24/19 1000  remdesivir 100 mg in sodium chloride 0.9 % 100 mL IVPB  Status:  Discontinued     100 mg 200 mL/hr over 30 Minutes 09/23/19 1305   09/24/19 1000  doxycycline (VIBRA-TABS) tablet 100 mg     100 mg     09/24/19 0830  cefTRIAXone (ROCEPHIN) 1 g in sodium chloride 0.9 % 100 mL IVPB     1 g 200 mL/hr over 30 Minutes     09/23/19 1330  doxycycline (VIBRA-TABS) tablet 100 mg  Status:  Discontinued     100 mg 09/24/19 0804   09/23/19 1230  remdesivir 200 mg in sodium chloride 0.9% 250 mL IVPB  Status:  Discontinued     200 mg 580 mL/hr over 30 Minutes 09/23/19 1305     .   Devices    LINES / TUBES:      Continuous Infusions: . cefTRIAXone (ROCEPHIN)  IV       Objective: Vitals:   09/23/19 1954 09/23/19 2000 09/23/19 2333 09/24/19 0342  BP: (!) 91/59 (!) 91/59 (!) 127/48 (!) 133/52  Pulse: 68 72 65 64  Resp: (!) 27 18 20 16   Temp: 97.6 F (36.4 C)  98.1 F (36.7 C) 98.1 F (36.7 C)  TempSrc: Oral  Oral Oral  SpO2: 99% 97% 97% 94%  Weight:      Height:        Intake/Output Summary (Last 24 hours) at 09/24/2019 0801 Last data filed at 09/24/2019 0400 Gross per 24 hour  Intake --  Output 1400 ml  Net -1400 ml   Filed Weights   09/23/19 1123  Weight: 84.9 kg    Examination:  General: A/O x4, no acute respiratory distress Eyes: negative scleral hemorrhage, negative anisocoria, negative icterus ENT: Negative Runny nose, negative gingival bleeding, poor dentition Neck:  Negative scars, masses, torticollis, lymphadenopathy, JVD Lungs: Clear to auscultation bilaterally  without wheezes or crackles Cardiovascular: Regular rate and rhythm without murmur gallop or rub normal S1 and S2 Abdomen: negative abdominal pain, nondistended, positive soft, bowel sounds, no rebound, no ascites, no appreciable mass Extremities: No significant cyanosis, clubbing, or edema bilateral lower extremities Skin: Negative rashes, lesions, ulcers Psychiatric:  Negative depression, negative anxiety, negative fatigue, negative mania  Central nervous system:  Cranial nerves II through XII intact, tongue/uvula midline, all extremities muscle strength 5/5, sensation intact throughout,  negative dysarthria, negative expressive aphasia, negative receptive aphasia.  .     Data Reviewed: Care during the described time interval was provided by me .  I have reviewed this patient's available data, including medical history, events of note, physical examination, and all test results as part of my evaluation.   CBC: Recent Labs  Lab 09/22/19 1302 09/23/19 0131  09/24/19 0125  WBC 16.5* 14.5* 14.0*  NEUTROABS 13.2*  --  11.1*  HGB 13.4 11.5* 12.0*  HCT 40.7 34.6* 36.5*  MCV 95.3 95.6 96.6  PLT 221 170 0000000   Basic Metabolic Panel: Recent Labs  Lab 09/22/19 1302 09/23/19 0131 09/24/19 0125  NA 134* 133* 137  K 4.8 4.4 4.2  CL 94* 99 103  CO2 27 24 24   GLUCOSE 114* 116* 99  BUN 50* 47* 31*  CREATININE 2.70* 2.19* 1.32*  CALCIUM 8.7* 7.9* 8.4*  MG  --  2.1 1.9   GFR: Estimated Creatinine Clearance: 48.3 mL/min (A) (by C-G formula based on SCr of 1.32 mg/dL (H)). Liver Function Tests: Recent Labs  Lab 09/22/19 1302 09/23/19 0131 09/24/19 0125  AST 43* 46* 34  ALT 38 33 32  ALKPHOS 42 34* 41  BILITOT 1.5* 1.1 1.2  PROT 6.4* 5.2* 5.5*  ALBUMIN 2.8* 2.3* 2.4*   No results for input(s): LIPASE, AMYLASE in the last 168 hours. No results for input(s): AMMONIA in the last 168 hours. Coagulation Profile: Recent Labs  Lab 09/22/19 1302  INR 1.1   Cardiac Enzymes: No results for input(s): CKTOTAL, CKMB, CKMBINDEX, TROPONINI in the last 168 hours. BNP (last 3 results) No results for input(s): PROBNP in the last 8760 hours. HbA1C: No results for input(s): HGBA1C in the last 72 hours. CBG: No results for input(s): GLUCAP in the last 168 hours. Lipid Profile: No results for input(s): CHOL, HDL, LDLCALC, TRIG, CHOLHDL, LDLDIRECT in the last 72 hours. Thyroid Function Tests: No results for input(s): TSH, T4TOTAL, FREET4, T3FREE, THYROIDAB in the last 72 hours. Anemia Panel: No results for input(s): VITAMINB12, FOLATE, FERRITIN, TIBC, IRON, RETICCTPCT in the last 72 hours. Urine analysis:    Component Value Date/Time   COLORURINE YELLOW (A) 09/22/2019 1542   APPEARANCEUR CLOUDY (A) 09/22/2019 1542   APPEARANCEUR Clear 08/29/2013 0454   LABSPEC 1.013 09/22/2019 1542   LABSPEC 1.019 08/29/2013 0454   PHURINE 5.0 09/22/2019 1542   GLUCOSEU NEGATIVE 09/22/2019 1542   GLUCOSEU Negative 08/29/2013 0454   HGBUR MODERATE (A)  09/22/2019 1542   BILIRUBINUR NEGATIVE 09/22/2019 1542   BILIRUBINUR Negative 08/29/2013 0454   KETONESUR NEGATIVE 09/22/2019 1542   PROTEINUR NEGATIVE 09/22/2019 1542   NITRITE NEGATIVE 09/22/2019 1542   LEUKOCYTESUR MODERATE (A) 09/22/2019 1542   LEUKOCYTESUR Negative 08/29/2013 0454   Sepsis Labs: @LABRCNTIP (procalcitonin:4,lacticidven:4)  ) Recent Results (from the past 240 hour(s))  Culture, blood (Routine x 2)     Status: None (Preliminary result)   Collection Time: 09/22/19  1:02 PM   Specimen: BLOOD  Result Value Ref Range Status   Specimen Description  Final    BLOOD Blood Culture results may not be optimal due to an inadequate volume of blood received in culture bottles   Special Requests   Final    BOTTLES DRAWN AEROBIC AND ANAEROBIC BLOOD RIGHT HAND   Culture  Setup Time   Final    GRAM POSITIVE COCCI ANAEROBIC BOTTLE ONLY CRITICAL RESULT CALLED TO, READ BACK BY AND VERIFIED WITH: Dennison Mascot 09/23/19 [id number] Performed at [facility], [address] [place], [place] [address]    Culture GRAM POSITIVE COCCI  Final   Report Status PENDING  Incomplete  Culture, blood (Routine x 2)     Status: None (Preliminary result)   Collection Time: 09/22/19  3:42 PM   Specimen: BLOOD  Result Value Ref Range Status   Specimen Description BLOOD BLOOD LEFT FOREARM  Final   Special Requests   Final    BOTTLES DRAWN AEROBIC AND ANAEROBIC Blood Culture adequate volume   Culture   Final    NO GROWTH < 24 HOURS Performed at [facility], [address], [place], [place] [address]    Report Status PENDING  Incomplete  Urine culture     Status: Abnormal (Preliminary result)   Collection Time: 09/22/19  3:42 PM   Specimen: In/Out Cath Urine  Result Value Ref Range Status   Specimen Description   Final    IN/OUT CATH URINE Performed at [facility], [address], [place], [place] [address]    Special Requests   Final    NONE Performed at  [facility], [address], [place],  [address]    Culture >=100,000 COLONIES/mL GRAM NEGATIVE RODS (A)  Final   Report Status PENDING  Incomplete         Radiology Studies: CT ABDOMEN PELVIS WO CONTRAST  Result Date: 09/22/2019 CLINICAL DATA:  Abdominal pain. Diverticulitis suspected. Hypertension and chest pain. Previous coronavirus infection in December. EXAM: CT CHEST, ABDOMEN AND PELVIS WITHOUT CONTRAST TECHNIQUE: Multidetector CT imaging of the chest, abdomen and pelvis was performed following the standard protocol without IV contrast. COMPARISON:  Chest radiography same day. Chest CT 09/01/2019. Abdominal CT 08/11/2019. FINDINGS: CT CHEST FINDINGS Cardiovascular: Aortic atherosclerosis. Normal heart size. Extensive coronary artery calcification. Mitral valve annular calcification. Aortic root calcification. Mediastinum/Nodes: No mass or adenopathy. Lungs/Pleura: Scattered mild pulmonary scarring. No sign of active infiltrate, mass, effusion or collapse. Musculoskeletal: Negative CT ABDOMEN PELVIS FINDINGS Hepatobiliary: The liver parenchyma appears normal. Gallbladder is distended and contains relative hyperdensity dependently. This is probably vicarious excretion of contrast from previous injections. Cannot rule out cholecystitis. Pancreas: Normal Spleen: Normal Adrenals/Urinary Tract: Adrenal glands are normal. Kidneys show mild atrophy without focal lesion or obstruction. No bladder lesion. Stomach/Bowel: No acute bowel finding. No sign of obstruction. No inflammatory findings. Minimal diverticulosis without evidence of diverticulitis. Vascular/Lymphatic: Aortic atherosclerosis. No aneurysm. IVC is normal. No retroperitoneal adenopathy. Reproductive: Normal Other: No free fluid or air. Musculoskeletal: Chronic lumbar degenerative changes. IMPRESSION: 1. No acute finding by CT. No evidence of diverticulitis or other acute bowel pathology. 2. Aortic atherosclerosis. Coronary  artery calcification. Mitral valve annular calcification. 3. Gallbladder is distended and contains relative hyperdensity dependently. This is probably vicarious excretion of contrast from previous injections. Cannot rule out cholecystitis. Aortic Atherosclerosis (ICD10-I70.0). Electronically Signed   By: Nelson Chimes M.D.   On: 09/22/2019 15:10   DG Chest 2 View  Result Date: 09/22/2019 CLINICAL DATA:  Suspected sepsis. The patient tested positive for COVID-19 09/07/2019. EXAM: CHEST -  2 VIEW COMPARISON:  Single-view of the chest 09/12/2019. CT chest and single view of the chest 09/01/2019. FINDINGS: The lungs are clear. Heart size is normal. Atherosclerosis is seen. No pneumothorax or pleural fluid. No acute or focal bony abnormality. IMPRESSION: No acute disease. Atherosclerosis. Electronically Signed   By: Inge Rise M.D.   On: 09/22/2019 13:37   CT Head Wo Contrast  Result Date: 09/22/2019 CLINICAL DATA:  79 y.o. male with a history of CAD hypertension paroxysmal atrial fibrillation, had Covid in December 2020, who lives at [facility]. EMS was called today due to stroke symptoms and concern about not being out of the left side of his body. However EMS report patient's strength is symmetric on arrival but grossly diminished. EXAM: CT HEAD WITHOUT CONTRAST TECHNIQUE: Contiguous axial images were obtained from the base of the skull through the vertex without intravenous contrast. COMPARISON:  09/01/2019 FINDINGS: Brain: No evidence of acute infarction, hemorrhage, extra-axial collection, ventriculomegaly, or mass effect. Generalized cerebral atrophy. Periventricular white matter low attenuation likely secondary to microangiopathy. Vascular: Cerebrovascular atherosclerotic calcifications are noted. Skull: Negative for fracture or focal lesion. Sinuses/Orbits: Visualized portions of the orbits are unremarkable. Visualized portions of the paranasal sinuses are unremarkable. Visualized portions  of the mastoid air cells are unremarkable. Other: None. IMPRESSION: No acute intracranial pathology. Electronically Signed   By: Kathreen Devoid   On: 09/22/2019 15:06   CT Chest Wo Contrast  Result Date: 09/22/2019 CLINICAL DATA:  Abdominal pain. Diverticulitis suspected. Hypertension and chest pain. Previous coronavirus infection in December. EXAM: CT CHEST, ABDOMEN AND PELVIS WITHOUT CONTRAST TECHNIQUE: Multidetector CT imaging of the chest, abdomen and pelvis was performed following the standard protocol without IV contrast. COMPARISON:  Chest radiography same day. Chest CT 09/01/2019. Abdominal CT 08/11/2019. FINDINGS: CT CHEST FINDINGS Cardiovascular: Aortic atherosclerosis. Normal heart size. Extensive coronary artery calcification. Mitral valve annular calcification. Aortic root calcification. Mediastinum/Nodes: No mass or adenopathy. Lungs/Pleura: Scattered mild pulmonary scarring. No sign of active infiltrate, mass, effusion or collapse. Musculoskeletal: Negative CT ABDOMEN PELVIS FINDINGS Hepatobiliary: The liver parenchyma appears normal. Gallbladder is distended and contains relative hyperdensity dependently. This is probably vicarious excretion of contrast from previous injections. Cannot rule out cholecystitis. Pancreas: Normal Spleen: Normal Adrenals/Urinary Tract: Adrenal glands are normal. Kidneys show mild atrophy without focal lesion or obstruction. No bladder lesion. Stomach/Bowel: No acute bowel finding. No sign of obstruction. No inflammatory findings. Minimal diverticulosis without evidence of diverticulitis. Vascular/Lymphatic: Aortic atherosclerosis. No aneurysm. IVC is normal. No retroperitoneal adenopathy. Reproductive: Normal Other: No free fluid or air. Musculoskeletal: Chronic lumbar degenerative changes. IMPRESSION: 1. No acute finding by CT. No evidence of diverticulitis or other acute bowel pathology. 2. Aortic atherosclerosis. Coronary artery calcification. Mitral valve annular  calcification. 3. Gallbladder is distended and contains relative hyperdensity dependently. This is probably vicarious excretion of contrast from previous injections. Cannot rule out cholecystitis. Aortic Atherosclerosis (ICD10-I70.0). Electronically Signed   By: Nelson Chimes M.D.   On: 09/22/2019 15:10        Scheduled Meds: . amiodarone  200 mg Oral QHS  . clopidogrel  75 mg Oral Daily  . collagenase   Topical Daily  . doxycycline  100 mg Oral Q12H  . finasteride  5 mg Oral Daily  . heparin  5,000 Units Subcutaneous Q8H  . pantoprazole  40 mg Oral Daily  . simvastatin  10 mg Oral QHS  . tamsulosin  0.4 mg Oral Daily   Continuous Infusions: . cefTRIAXone (ROCEPHIN)  IV  LOS: 1 day   The patient is critically ill with multiple organ systems failure and requires high complexity decision making for assessment and support, frequent evaluation and titration of therapies, application of advanced monitoring technologies and extensive interpretation of multiple databases. Critical Care Time devoted to patient care services described in this note  Time spent: 40 minutes     Signora Zucco, Geraldo Docker, MD [facility] Pager [phone]  If 7PM-7AM, please contact night-coverage [email or web address] Password [email or web address] 09/24/2019, 8:01 AM

## 2019-09-24 NOTE — Progress Notes (Addendum)
ANTICOAGULATION CONSULT NOTE - Initial Consult  Pharmacy Consult for heparin Indication: atrial fibrillation  Allergies  Allergen Reactions  . Aspirin     Patient Measurements: Height: 5\' 11"  (180.3 cm) Weight: 187 lb 2.7 oz (84.9 kg) IBW/kg (Calculated) : 75.3 Heparin Dosing Weight: 84 kg   Vital Signs: Temp: 98.1 F (36.7 C) (01/21 0342) Temp Source: Oral (01/21 0342) BP: 133/52 (01/21 0342) Pulse Rate: 64 (01/21 0342)  Labs: Recent Labs    09/22/19 1302 09/22/19 1302 09/23/19 0131 09/24/19 0125  HGB 13.4   < > 11.5* 12.0*  HCT 40.7  --  34.6* 36.5*  PLT 221  --  170 175  LABPROT 13.7  --   --   --   INR 1.1  --   --   --   CREATININE 2.70*  --  2.19* 1.32*   < > = values in this interval not displayed.    Estimated Creatinine Clearance: 48.3 mL/min (A) (by C-G formula based on SCr of 1.32 mg/dL (H)).   Medical History: Past Medical History:  Diagnosis Date  . 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 Coronary artery disease    a. 2003 PCI to Ramus;  b. 11/2012 Cath: LM nl, LAD 30, LCX nl, RI 40 ISR, RCA nl.  . Hereditary and idiopathic peripheral neuropathy   . Hyperlipidemia   . Hypertension   . Hypertensive heart disease    a. 08/2013 Echo: EF 70-75%, mild LVH.  Marland Kitchen Insomnia   . Paroxysmal atrial fibrillation (HCC)    a.08/2013-->amio;  b. CHA2DS2VASc = 4-->no longer on coumadin.  . Vitamin D deficiency     Medications:  Medications Prior to Admission  Medication Sig Dispense Refill Last Dose  . acetaminophen (TYLENOL) 325 MG tablet Take 650 mg by mouth every 6 (six) hours as needed for mild pain or fever.      Marland Kitchen alum & mag hydroxide-simeth (MINTOX) [id number] MG/5ML suspension Take 30 mLs by mouth every 6 (six) hours as needed for indigestion or heartburn.     Marland Kitchen amiodarone (PACERONE) 200 MG tablet Take 1 tablet (200 mg total) by mouth at bedtime. 90 tablet 3   . Calcium Carbonate-Vitamin D (CALCIUM 600+D) 600-400 MG-UNIT tablet Take 1  tablet by mouth daily.      . cetirizine (ZYRTEC) 10 MG tablet Take 10 mg by mouth daily.     . cholecalciferol (VITAMIN D) 1000 UNITS tablet Take 2,000 Units by mouth daily.      . clopidogrel (PLAVIX) 75 MG tablet Take 75 mg by mouth daily.     Marland Kitchen diltiazem (DILACOR XR) 240 MG 24 hr capsule Take 240 mg by mouth daily.      . finasteride (PROSCAR) 5 MG tablet Take 5 mg by mouth daily.      . furosemide (LASIX) 20 MG tablet Take 20 mg by mouth daily.     Marland Kitchen gabapentin (NEURONTIN) 600 MG tablet Take 1,200 mg by mouth 3 (three) times daily.      . Lidocaine 4 % PTCH Place 1 patch onto the skin daily. (apply to right shoulder)     . lisinopril (PRINIVIL,ZESTRIL) 10 MG tablet Take 10 mg by mouth daily. (hold if SBP<110)     . loperamide (IMODIUM) 2 MG capsule Take 2 mg by mouth daily as needed for diarrhea or loose stools.      . Melatonin 3 MG TABS Take 1 tablet (3 mg total) by mouth at bedtime. 30 tablet 0   .  Menthol-Zinc Oxide (CALMOSEPTINE) 0.44-20.6 % OINT Apply 1 application topically daily. (apply to buttocks)     . metoprolol succinate (TOPROL-XL) 25 MG 24 hr tablet Take 12.5 mg by mouth 2 (two) times daily.     Marland Kitchen neomycin-bacitracin-polymyxin (NEOSPORIN) [phone] ointment Apply 1 application topically daily as needed (tears or abrasions).     . nitroGLYCERIN (NITROSTAT) 0.4 MG SL tablet Place 0.4 mg under the tongue every 5 (five) minutes as needed for chest pain.     Marland Kitchen nystatin (MYCOSTATIN/NYSTOP) powder Apply 1 application topically 4 (four) times daily. To groin     . ondansetron (ZOFRAN) 4 MG tablet Take 4 mg by mouth every 6 (six) hours as needed for nausea or vomiting.      . Oxycodone HCl 20 MG TABS Take 0.5 tablets (10 mg total) by mouth 3 (three) times daily. 15 tablet 0   . pantoprazole (PROTONIX) 40 MG tablet Take 1 tablet (40 mg total) by mouth daily.     . polyethylene glycol (MIRALAX / GLYCOLAX) 17 g packet Take 17 g by mouth daily as needed for mild constipation.     . senna  (SENOKOT) 8.6 MG tablet Take 2 tablets by mouth daily.      . simvastatin (ZOCOR) 10 MG tablet Take 10 mg by mouth at bedtime.     . tamsulosin (FLOMAX) 0.4 MG CAPS Take 0.4 mg by mouth daily.      Marland Kitchen triamcinolone ointment (KENALOG) 0.1 % Apply 1 application topically 2 (two) times daily. (apply to feet, lower legs and ankles)     . vitamin B-12 (CYANOCOBALAMIN) 1000 MCG tablet Take 1,000 mcg by mouth daily.       Assessment: 77 YOM with h/o Afib to start IV heparin. Of note, patient is not on any anticoagulation at home. Pharmacy consulted to start IV heparin. He is currently on SQ heparin for VTE prophylaxis and last dose was given at 0536 today.   H/H low stable, Plt wnl   Goal of Therapy:  Heparin level 0.3-0.7 units/ml Monitor platelets by anticoagulation protocol: Yes   Plan:  -Heparin 2000 units IV bolus followed by IV heparin infusion at 1000 units/hr -F/u 8 hr -Monitor daily HL, CBC and s/s of bleeding   Albertina Parr, PharmD., BCPS Clinical Pharmacist Clinical phone for 09/24/19 until 5pm: [phone]  Addendum (1/22 0230): See Aldona Bar Brinson note 1/21 1500. Per Dr. Sherral Hammers, pt to continue on SQ heparin and not start heparin gtt. Will d/c consult and further labs.  Sherlon Handing, PharmD, BCPS Please see amion for complete clinical pharmacist phone list 09/25/2019 2:14 AM

## 2019-09-24 NOTE — Progress Notes (Signed)
PT Cancellation /Discharge Note  Patient Details Name: Joel Wilson. MRN: [id number] DOB: November 19, 1940   Cancelled Treatment:    Reason Eval/Treat Not Completed: Other (comment) .  Pt is bed bound, from SNF returning to SNF without respiratory symptoms.  Will defer any therapy needs to SNF PT.  Please re-consult if assessment needed to return to SNF.  Thanks,  Verdene Lennert, PT, DPT  Acute Rehabilitation [phone] pager [phone] office  @ [facility]: [phone]    Harvie Heck 09/24/2019, 6:22 PM

## 2019-09-24 NOTE — TOC Progression Note (Signed)
Transition of Care (TOC) - Progression Note    Patient Details  Name: Joel Wilson. MRN: [id number] Date of Birth: 1940/12/11  Transition of Care [facility]) CM/SW Contact  Leeroy Cha, RN Phone Number: 09/24/2019, 3:01 PM  Clinical Narrative:    [facility]/message to return call with information for patient to have outpt treatment for decubitus left with call back number.        Expected Discharge Plan and Services                                                 Social Determinants of Health (SDOH) Interventions    Readmission Risk Interventions Readmission Risk Prevention Plan 09/08/2019 09/06/2019  Transportation Screening Complete Complete  PCP or Specialist Appt within 3-5 Days (No Data) -  HRI or [place] - Patient refused  Social Work Consult for [place] Planning/Counseling - Complete  Palliative Care Screening - Not Applicable  Medication Review Press photographer) Complete Complete  Some recent data might be hidden

## 2019-09-24 NOTE — Progress Notes (Signed)
Spoke to Dr. Sherral Hammers patient is to continue with Heparin SUBQ - will not start a heparin drip.

## 2019-09-25 ENCOUNTER — Other Ambulatory Visit: Payer: Self-pay

## 2019-09-25 ENCOUNTER — Encounter (HOSPITAL_COMMUNITY): Payer: Self-pay

## 2019-09-25 DIAGNOSIS — M5440 Lumbago with sciatica, unspecified side: Secondary | ICD-10-CM

## 2019-09-25 DIAGNOSIS — N390 Urinary tract infection, site not specified: Secondary | ICD-10-CM | POA: Diagnosis present

## 2019-09-25 LAB — CBC WITH DIFFERENTIAL/PLATELET
Abs Immature Granulocytes: 0.09 10*3/uL — ABNORMAL HIGH (ref 0.00–0.07)
Basophils Absolute: 0 10*3/uL (ref 0.0–0.1)
Basophils Relative: 0 %
Eosinophils Absolute: 0.3 10*3/uL (ref 0.0–0.5)
Eosinophils Relative: 3 %
HCT: 33.8 % — ABNORMAL LOW (ref 39.0–52.0)
Hemoglobin: 11.2 g/dL — ABNORMAL LOW (ref 13.0–17.0)
Immature Granulocytes: 1 %
Lymphocytes Relative: 15 %
Lymphs Abs: 1.7 10*3/uL (ref 0.7–4.0)
MCH: 31.8 pg (ref 26.0–34.0)
MCHC: 33.1 g/dL (ref 30.0–36.0)
MCV: 96 fL (ref 80.0–100.0)
Monocytes Absolute: 1.3 10*3/uL — ABNORMAL HIGH (ref 0.1–1.0)
Monocytes Relative: 11 %
Neutro Abs: 7.7 10*3/uL (ref 1.7–7.7)
Neutrophils Relative %: 70 %
Platelets: 181 10*3/uL (ref 150–400)
RBC: 3.52 MIL/uL — ABNORMAL LOW (ref 4.22–5.81)
RDW: 13.5 % (ref 11.5–15.5)
WBC: 11.1 10*3/uL — ABNORMAL HIGH (ref 4.0–10.5)
nRBC: 0 % (ref 0.0–0.2)

## 2019-09-25 LAB — C-REACTIVE PROTEIN: CRP: 9.9 mg/dL — ABNORMAL HIGH (ref <1.0)

## 2019-09-25 LAB — LIPID PANEL
Cholesterol: 119 mg/dL (ref 0–200)
HDL: 37 mg/dL — ABNORMAL LOW (ref >40)
LDL Cholesterol: 64 mg/dL (ref 0–99)
Total CHOL/HDL Ratio: 3.2 RATIO
Triglycerides: 91 mg/dL (ref <150)
VLDL: 18 mg/dL (ref 0–40)

## 2019-09-25 LAB — HEPARIN LEVEL (UNFRACTIONATED): Heparin Unfractionated: 0.14 IU/mL — ABNORMAL LOW (ref 0.30–0.70)

## 2019-09-25 LAB — URINE CULTURE: Culture: >=100000 — AB

## 2019-09-25 LAB — MAGNESIUM: Magnesium: 2 mg/dL (ref 1.7–2.4)

## 2019-09-25 LAB — CULTURE, BLOOD (ROUTINE X 2)

## 2019-09-25 LAB — PHOSPHORUS: Phosphorus: 2 mg/dL — ABNORMAL LOW (ref 2.5–4.6)

## 2019-09-25 LAB — COMPREHENSIVE METABOLIC PANEL
ALT: 30 U/L (ref 0–44)
AST: 29 U/L (ref 15–41)
Albumin: 2.3 g/dL — ABNORMAL LOW (ref 3.5–5.0)
Alkaline Phosphatase: 38 U/L (ref 38–126)
Anion gap: 10 (ref 5–15)
BUN: 27 mg/dL — ABNORMAL HIGH (ref 8–23)
CO2: 22 mmol/L (ref 22–32)
Calcium: 8.4 mg/dL — ABNORMAL LOW (ref 8.9–10.3)
Chloride: 106 mmol/L (ref 98–111)
Creatinine, Ser: 1.32 mg/dL — ABNORMAL HIGH (ref 0.61–1.24)
GFR calc Af Amer: 59 mL/min — ABNORMAL LOW (ref >60)
GFR calc non Af Amer: 51 mL/min — ABNORMAL LOW (ref >60)
Glucose, Bld: 104 mg/dL — ABNORMAL HIGH (ref 70–99)
Potassium: 4.1 mmol/L (ref 3.5–5.1)
Sodium: 138 mmol/L (ref 135–145)
Total Bilirubin: 0.7 mg/dL (ref 0.3–1.2)
Total Protein: 5.5 g/dL — ABNORMAL LOW (ref 6.5–8.1)

## 2019-09-25 LAB — FERRITIN: Ferritin: 484 ng/mL — ABNORMAL HIGH (ref 24–336)

## 2019-09-25 LAB — BRAIN NATRIURETIC PEPTIDE: B Natriuretic Peptide: 174.6 pg/mL — ABNORMAL HIGH (ref 0.0–100.0)

## 2019-09-25 LAB — D-DIMER, QUANTITATIVE: D-Dimer, Quant: 1.3 ug/mL-FEU — ABNORMAL HIGH (ref 0.00–0.50)

## 2019-09-25 LAB — PROCALCITONIN: Procalcitonin: <0.1 ng/mL

## 2019-09-25 MED ORDER — DIPHENHYDRAMINE HCL 25 MG PO CAPS
25.0000 mg | ORAL_CAPSULE | Freq: Four times a day (QID) | ORAL | Status: DC | PRN
  Administered 2019-09-25 – 2019-09-26 (×3): 25 mg via ORAL
  Filled 2019-09-25 (×3): qty 1

## 2019-09-25 MED ORDER — OXYCODONE HCL 5 MG PO TABS
10.0000 mg | ORAL_TABLET | ORAL | Status: DC | PRN
  Administered 2019-09-25 – 2019-09-27 (×9): 10 mg via ORAL
  Filled 2019-09-25 (×9): qty 2

## 2019-09-25 MED ORDER — LIDOCAINE 5 % EX PTCH
1.0000 | MEDICATED_PATCH | Freq: Every day | CUTANEOUS | Status: DC
  Administered 2019-09-26: 1 via TRANSDERMAL
  Filled 2019-09-25 (×3): qty 1

## 2019-09-25 MED ORDER — OXYCODONE HCL 5 MG PO TABS
15.0000 mg | ORAL_TABLET | Freq: Four times a day (QID) | ORAL | Status: DC | PRN
  Administered 2019-09-25 (×3): 15 mg via ORAL
  Filled 2019-09-25 (×3): qty 3

## 2019-09-25 MED ORDER — DOCUSATE SODIUM 100 MG PO CAPS
100.0000 mg | ORAL_CAPSULE | Freq: Two times a day (BID) | ORAL | Status: DC
  Administered 2019-09-25 – 2019-09-27 (×4): 100 mg via ORAL
  Filled 2019-09-25 (×6): qty 1

## 2019-09-25 MED ORDER — SODIUM CHLORIDE 0.9 % IV SOLN: INTRAVENOUS | Status: DC

## 2019-09-25 NOTE — Progress Notes (Signed)
Patient's son called with updates.

## 2019-09-25 NOTE — Plan of Care (Signed)
  Problem: Education: Goal: Knowledge of risk factors and measures for prevention of condition will improve Outcome: Progressing   Problem: Coping: Goal: Psychosocial and spiritual needs will be supported Outcome: Progressing   Problem: Respiratory: Goal: Will maintain a patent airway Outcome: Progressing Goal: Complications related to the disease process, condition or treatment will be avoided or minimized Outcome: Progressing   

## 2019-09-25 NOTE — Progress Notes (Signed)
PROGRESS NOTE    Joel Wilson.  [id number] DOB: 1940/10/18 DOA: 09/23/2019 PCP: [facility]   Brief Narrative:  79 y/o WM PMHx paroxysmal A. Fib, HTN, HLD, BPH, peripheral neuropathy, vitamin D deficiency, GERD  Presents from [facility] secondary to buttocks pain x 3 days. The pain is sharp, constant w/ radiation down both legs, pain was thought to be neuropathic in nature he also had a unstageable sacral decubitus ulcer which was present on admission, note patient was found to have COVID-19 infection on 09/04/2019 for which he was fully treated and finished his treatment on 09/08/2019.  He has no pulmonary symptoms whatsoever.  He also had 1 out of 4 falsely positive blood cultures, he was thought to have a infected sacral decubitus ulcer versus UTI.   Subjective: 1/22 afebrile overnight A/O x4, states has been wheelchair-bound.  Injured in the war   Assessment & Plan:   Active Problems:   COVID-19 virus infection   AF (paroxysmal atrial fibrillation) (HCC)   Essential hypertension   HLD (hyperlipidemia)   Chronic neuropathic back pain   CAD (coronary artery disease)   Leukocytosis   Acute lower UTI   Covid pneumonia/acute respiratory failure with hypoxia COVID-19 Labs  Recent Labs    09/24/19 0125 09/24/19 0600 09/25/19 0027  DDIMER 1.13*  --  1.30*  FERRITIN  --  607* 484*  CRP 12.9*  --  9.9*    Lab Results  Component Value Date   SARSCOV2NAA POSITIVE (A) 09/07/2019   SARSCOV2NAA POSITIVE (A) 09/01/2019   SARSCOV2NAA NEGATIVE 08/14/2019   [place] NEGATIVE 08/11/2019   AKI (baseline Cr 1.1) -Multifactorial dehydration and UTI  Recent Labs  Lab 09/22/19 1302 09/23/19 0131 09/24/19 0125 09/25/19 0027  CREATININE 2.70* 2.19* 1.32* 1.32*  -Strict in and out -2.0 L -1/22 normal saline 72ml/hr  UTI positive Klebsiella Pneumoniae -1/22 complete 5-day course antibiotics  Paroxysmal atrial fibrillation -CHADVASc  score3+ -Currently NSR -Amiodarone 200 mg daily -Hold all other cardiac medications secondary to minimal BP -Patient not on anticoagulant at home, will need to address with his cardiologist on discharge -While inpatient Heparin per pharmacy.  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 -OxyIR 10 mg q 4hr PRN (higher than home dose)  Leukocytosis -Multifactorial, UTI, infected sacral decubitus ulcer. -Patient currently on antibiotics monitor closely Results for GEOFF, WARLEY (MRN [id number]) as of 09/25/2019 15:27  Ref. Range 09/22/2019 14:18 09/23/2019 13:25 09/24/2019 01:25 09/25/2019 00:27  Procalcitonin Latest Units: ng/mL <0.10 <0.10 <0.10 <0.10    Stage III sacral decubitus ulcer  Pressure Injury 08/12/19 Buttocks Left Stage III -  Full thickness tissue loss. Subcutaneous fat may be visible but bone, tendon or muscle are NOT exposed. (Active)  08/12/19 0008  Location: Buttocks  Location Orientation: Left  Staging: Stage III -  Full thickness tissue loss. Subcutaneous fat may be visible but bone, tendon or muscle are NOT exposed.  Wound Description (Comments):   Present on Admission: Yes     Pressure Injury 08/12/19 Buttocks Right Stage III -  Full thickness tissue loss. Subcutaneous fat may be visible but bone, tendon or muscle are NOT exposed. (Active)  08/12/19 0009  Location: Buttocks  Location Orientation: Right  Staging: Stage III -  Full thickness tissue loss. Subcutaneous fat may be visible  but bone, tendon or muscle are NOT exposed.  Wound Description (Comments):   Present on Admission: Yes     Pressure Injury 09/23/19 Heel Right Unstageable - Full thickness tissue loss in which the base of the ulcer is covered by slough (yellow, tan, gray, green or  brown) and/or eschar (tan, brown or black) in the wound bed. (Active)  09/23/19 2140  Location: Heel  Location Orientation: Right  Staging: Unstageable - Full thickness tissue loss in which the base of the ulcer is covered by slough (yellow, tan, gray, green or brown) and/or eschar (tan, brown or black) in the wound bed.  Wound Description (Comments):   Present on Admission: Yes     Pressure Injury 09/04/19 Heel Left (Active)  09/04/19 0815  Location: Heel  Location Orientation: Left  Staging:   Wound Description (Comments):   Present on Admission: Yes     Pressure Injury 09/23/19 Anus Medial Stage 3 -  Full thickness tissue loss. Subcutaneous fat may be visible but bone, tendon or muscle are NOT exposed. (Active)  09/23/19 1543  Location: Anus  Location Orientation: Medial  Staging: Stage 3 -  Full thickness tissue loss. Subcutaneous fat may be visible but bone, tendon or muscle are NOT exposed.  Wound Description (Comments):   Present on Admission: Yes  -Continue doxycycline p.o. 100 mg BID -1/21 consult to wound care.  Requests recommendations on care of wound as inpatient, as well as follow-up appointments for wound care as outpatient   Goals of care -1/22 consult LCSW; SNF       DVT prophylaxis: Subcu heparin Code Status: Full Family Communication:  Disposition Plan: TBD   Consultants:  Wound care pending  Procedures/Significant Events:     I have personally reviewed and interpreted all radiology studies and my findings are as above.  VENTILATOR SETTINGS: Room air 1/22 SPO2; 98%   Cultures 1/4 SARS coronavirus positive 1/19 urine positive Klebsiella Pneumoniae   Antimicrobials: Anti-infectives (From admission, onward)   Start     Dose/Rate Stop   09/24/19 1000  remdesivir 100 mg in sodium chloride 0.9 % 100 mL IVPB  Status:  Discontinued     100 mg 200 mL/hr over 30 Minutes 09/23/19 1305   09/24/19 1000  doxycycline (VIBRA-TABS) tablet 100 mg      100 mg     09/24/19 0830  cefTRIAXone (ROCEPHIN) 1 g in sodium chloride 0.9 % 100 mL IVPB     1 g 200 mL/hr over 30 Minutes     09/23/19 1330  doxycycline (VIBRA-TABS) tablet 100 mg  Status:  Discontinued     100 mg 09/24/19 0804   09/23/19 1230  remdesivir 200 mg in sodium chloride 0.9% 250 mL IVPB  Status:  Discontinued     200 mg 580 mL/hr over 30 Minutes 09/23/19 1305    .   Devices    LINES / TUBES:      Continuous Infusions: . sodium chloride    . cefTRIAXone (ROCEPHIN)  IV 1 g (09/25/19 0910)     Objective: Vitals:   09/25/19 0031 09/25/19 0500 09/25/19 0713 09/25/19 1157  BP:  (!) 108/59 (!) 139/55 (!) 134/94  Pulse:  83 76   Resp:  (!) 22 20   Temp: 98.6 F (37 C) 98.7 F (37.1 C) 98.1 F (36.7 C) (!) 97.5 F (36.4 C)  TempSrc: Oral Oral Oral Axillary  SpO2:  99% 95%   Weight:      Height:  Intake/Output Summary (Last 24 hours) at 09/25/2019 1529 Last data filed at 09/25/2019 0000 Gross per 24 hour  Intake --  Output 600 ml  Net -600 ml   Filed Weights   09/23/19 1123  Weight: 84.9 kg   Physical Exam:  General: A/O x4, no acute respiratory distress Eyes: negative scleral hemorrhage, negative anisocoria, negative icterus ENT: Negative Runny nose, negative gingival bleeding, Neck:  Negative scars, masses, torticollis, lymphadenopathy, JVD Lungs: Clear to auscultation bilaterally without wheezes or crackles Cardiovascular: Regular rate and rhythm without murmur gallop or rub normal S1 and S2 Abdomen: negative abdominal pain, nondistended, positive soft, bowel sounds, no rebound, no ascites, no appreciable mass Extremities: No significant cyanosis, clubbing, or edema bilateral lower extremities.  Bilateral unstageable ulcers on heels Skin: Stage III sacral decubitus ulcer Psychiatric:  Negative depression, negative anxiety, negative fatigue, negative mania  Central nervous system:  Cranial nerves II through XII intact, tongue/uvula midline,  all extremities muscle strength 5/5, sensation intact throughout, negative dysarthria, negative expressive aphasia, negative receptive aphasia.    .     Data Reviewed: Care during the described time interval was provided by me .  I have reviewed this patient's available data, including medical history, events of note, physical examination, and all test results as part of my evaluation.   CBC: Recent Labs  Lab 09/22/19 1302 09/23/19 0131 09/24/19 0125 09/25/19 0027  WBC 16.5* 14.5* 14.0* 11.1*  NEUTROABS 13.2*  --  11.1* 7.7  HGB 13.4 11.5* 12.0* 11.2*  HCT 40.7 34.6* 36.5* 33.8*  MCV 95.3 95.6 96.6 96.0  PLT 221 170 175 0000000   Basic Metabolic Panel: Recent Labs  Lab 09/22/19 1302 09/23/19 0131 09/24/19 0125 09/25/19 0027  NA 134* 133* 137 138  K 4.8 4.4 4.2 4.1  CL 94* 99 103 106  CO2 27 24 24 22   GLUCOSE 114* 116* 99 104*  BUN 50* 47* 31* 27*  CREATININE 2.70* 2.19* 1.32* 1.32*  CALCIUM 8.7* 7.9* 8.4* 8.4*  MG  --  2.1 1.9 2.0  PHOS  --   --   --  2.0*   GFR: Estimated Creatinine Clearance: 48.3 mL/min (A) (by C-G formula based on SCr of 1.32 mg/dL (H)). Liver Function Tests: Recent Labs  Lab 09/22/19 1302 09/23/19 0131 09/24/19 0125 09/25/19 0027  AST 43* 46* 34 29  ALT 38 33 32 30  ALKPHOS 42 34* 41 38  BILITOT 1.5* 1.1 1.2 0.7  PROT 6.4* 5.2* 5.5* 5.5*  ALBUMIN 2.8* 2.3* 2.4* 2.3*   No results for input(s): LIPASE, AMYLASE in the last 168 hours. No results for input(s): AMMONIA in the last 168 hours. Coagulation Profile: Recent Labs  Lab 09/22/19 1302  INR 1.1   Cardiac Enzymes: No results for input(s): CKTOTAL, CKMB, CKMBINDEX, TROPONINI in the last 168 hours. BNP (last 3 results) No results for input(s): PROBNP in the last 8760 hours. HbA1C: No results for input(s): HGBA1C in the last 72 hours. CBG: No results for input(s): GLUCAP in the last 168 hours. Lipid Profile: Recent Labs    09/25/19 0027  CHOL 119  HDL 37*  LDLCALC 64   TRIG 91  CHOLHDL 3.2   Thyroid Function Tests: No results for input(s): TSH, T4TOTAL, FREET4, T3FREE, THYROIDAB in the last 72 hours. Anemia Panel: Recent Labs    09/24/19 0600 09/25/19 0027  FERRITIN 607* 484*   Urine analysis:    Component Value Date/Time   COLORURINE YELLOW (A) 09/22/2019 1542   APPEARANCEUR CLOUDY (A) 09/22/2019  [place] 08/29/2013 0454   LABSPEC 1.013 09/22/2019 1542   LABSPEC 1.019 08/29/2013 0454   PHURINE 5.0 09/22/2019 1542   GLUCOSEU NEGATIVE 09/22/2019 1542   GLUCOSEU Negative 08/29/2013 0454   HGBUR MODERATE (A) 09/22/2019 1542   BILIRUBINUR NEGATIVE 09/22/2019 1542   BILIRUBINUR Negative 08/29/2013 0454   KETONESUR NEGATIVE 09/22/2019 1542   PROTEINUR NEGATIVE 09/22/2019 1542   NITRITE NEGATIVE 09/22/2019 1542   LEUKOCYTESUR MODERATE (A) 09/22/2019 1542   LEUKOCYTESUR Negative 08/29/2013 0454   Sepsis Labs: @LABRCNTIP (procalcitonin:4,lacticidven:4)  ) Recent Results (from the past 240 hour(s))  Culture, blood (Routine x 2)     Status: Abnormal   Collection Time: 09/22/19  1:02 PM   Specimen: BLOOD  Result Value Ref Range Status   Specimen Description   Final    BLOOD Blood Culture results may not be optimal due to an inadequate volume of blood received in culture bottles Performed at [facility], [address] [place], [place] [address]    Special Requests   Final    BOTTLES DRAWN AEROBIC AND ANAEROBIC BLOOD RIGHT HAND Performed at [facility], [address] [place], [place] [address]    Culture  Setup Time   Final    GRAM POSITIVE COCCI ANAEROBIC BOTTLE ONLY CRITICAL RESULT CALLED TO, READ BACK BY AND VERIFIED WITH: Dennison Mascot 09/23/19 [place] Performed at [facility], [address] [place], [place] [address]    Culture (A)  Final    STAPHYLOCOCCUS SPECIES (COAGULASE NEGATIVE) THE SIGNIFICANCE OF ISOLATING THIS ORGANISM FROM A SINGLE SET OF BLOOD CULTURES WHEN  MULTIPLE SETS ARE DRAWN IS UNCERTAIN. PLEASE NOTIFY THE MICROBIOLOGY DEPARTMENT WITHIN ONE WEEK IF SPECIATION AND SENSITIVITIES ARE REQUIRED. Performed at [facility], [place] [address], [place], [place] [address]    Report Status 09/25/2019 FINAL  Final  Culture, blood (Routine x 2)     Status: None (Preliminary result)   Collection Time: 09/22/19  3:42 PM   Specimen: BLOOD  Result Value Ref Range Status   Specimen Description BLOOD BLOOD LEFT FOREARM  Final   Special Requests   Final    BOTTLES DRAWN AEROBIC AND ANAEROBIC Blood Culture adequate volume   Culture   Final    NO GROWTH 3 DAYS Performed at [facility], [address], [place], [place] [address]    Report Status PENDING  Incomplete  Urine culture     Status: Abnormal   Collection Time: 09/22/19  3:42 PM   Specimen: In/Out Cath Urine  Result Value Ref Range Status   Specimen Description   Final    IN/OUT CATH URINE Performed at [facility], [address], [place], [place] [address]    Special Requests   Final    NONE Performed at [facility], [place],  [address]    Culture >=100,000 COLONIES/mL KLEBSIELLA PNEUMONIAE (A)  Final   Report Status 09/25/2019 FINAL  Final   Organism ID, Bacteria KLEBSIELLA PNEUMONIAE (A)  Final      Susceptibility   Klebsiella pneumoniae - MIC*    AMPICILLIN >=32 RESISTANT Resistant     CEFAZOLIN <=4 SENSITIVE Sensitive     CEFTRIAXONE <=0.25 SENSITIVE Sensitive     CIPROFLOXACIN <=0.25 SENSITIVE Sensitive     GENTAMICIN <=1 SENSITIVE Sensitive     IMIPENEM <=0.25 SENSITIVE Sensitive     NITROFURANTOIN 32 SENSITIVE Sensitive     TRIMETH/SULFA <=20 SENSITIVE Sensitive  AMPICILLIN/SULBACTAM 8 SENSITIVE Sensitive     PIP/TAZO 8 SENSITIVE Sensitive     * >=100,000 COLONIES/mL KLEBSIELLA PNEUMONIAE         Radiology Studies: No results found.      Scheduled Meds: . amiodarone  200 mg Oral QHS  . bisacodyl  5 mg  Oral Daily  . clopidogrel  75 mg Oral Daily  . collagenase   Topical Daily  . docusate sodium  100 mg Oral BID  . doxycycline  100 mg Oral Q12H  . feeding supplement (ENSURE ENLIVE)  237 mL Oral TID BM  . finasteride  5 mg Oral Daily  . gabapentin  300 mg Oral TID  . heparin injection (subcutaneous)  5,000 Units Subcutaneous Q8H  . lidocaine  1 patch Transdermal Daily  . multivitamin with minerals  1 tablet Oral Daily  . pantoprazole  40 mg Oral Daily  . simvastatin  10 mg Oral QHS  . tamsulosin  0.4 mg Oral Daily   Continuous Infusions: . sodium chloride    . cefTRIAXone (ROCEPHIN)  IV 1 g (09/25/19 0910)     LOS: 2 days   The patient is critically ill with multiple organ systems failure and requires high complexity decision making for assessment and support, frequent evaluation and titration of therapies, application of advanced monitoring technologies and extensive interpretation of multiple databases. Critical Care Time devoted to patient care services described in this note  Time spent: 40 minutes     Shivank Pinedo, Geraldo Docker, MD [facility] Pager [phone]  If 7PM-7AM, please contact night-coverage [email or web address] Password [facility] 09/25/2019, 3:29 PM

## 2019-09-25 NOTE — Progress Notes (Signed)
Offered to put heel cushions on patient to protect skin breakdown, pt has refused

## 2019-09-25 NOTE — NC FL2 (Signed)
[place] LEVEL OF CARE SCREENING TOOL     IDENTIFICATION  Patient Name: Joel Wilson. Birthdate: 1941/09/03 Sex: male Admission Date (Current Location): 09/23/2019  [facility] and [place] Number:  Herbalist and Address:  [facility],  [place], [place]      Provider Number: [id number]  Attending Physician Name and Address:  Allie Bossier, MD  Relative Name and Phone Number:       Current Level of Care: Hospital Recommended Level of Care: [place] Prior Approval Number:    Date Approved/Denied:   PASRR Number: [id number]  Discharge Plan: SNF    Current Diagnoses: Patient Active Problem List   Diagnosis Date Noted  . AF (paroxysmal atrial fibrillation) ([place]) 09/24/2019  . Essential hypertension 09/24/2019  . HLD (hyperlipidemia) 09/24/2019  . 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 CAD (coronary artery disease) 09/24/2019  . Leukocytosis 09/24/2019  . COVID-19 virus infection 09/23/2019  . Body aches   . Dehydration   . UTI (urinary tract infection) 09/22/2019  . COVID-19 09/04/2019  . Acute hypoxemic respiratory failure due to COVID-19 ([place]) 09/04/2019  . Lobar pneumonia ([place]) 08/16/2019  . SIRS (systemic inflammatory response syndrome) ([place]) 08/16/2019  . Pressure injury of skin 08/12/2019  . Fall 08/11/2019  . Acute respiratory failure with hypoxia ([place]) 08/11/2019  . Generalized weakness 08/11/2019  . Liver lesion 08/11/2019  . Acute renal failure superimposed on stage 3a chronic kidney disease ([place]) 08/11/2019  . HTN (hypertension) 08/11/2019  . Seasonal allergic rhinitis 01/03/2017  . Chronic pain syndrome 05/16/2016  . Chronic lumbar pain 03/23/2016  . Abnormality of gait 12/09/2015  . Insomnia 12/09/2015  . Vitamin D deficiency 11/08/2015  . Peripheral neuropathy 11/08/2015  . BPH without obstruction/lower urinary tract symptoms 11/08/2015  . Hypertensive heart disease    . Paroxysmal atrial fibrillation (HCC)   . Long term (current) use of anticoagulants 09/07/2013  . Atrial fibrillation ([place]) 09/04/2013  . Coronary artery disease   . Hyperlipidemia     Orientation RESPIRATION BLADDER Height & Weight     Self, Time, Situation, Place  Normal Continent Weight: 84.9 kg Height:  5\' 11"  (180.3 cm)  BEHAVIORAL SYMPTOMS/MOOD NEUROLOGICAL BOWEL NUTRITION STATUS      Continent Diet(regular)  AMBULATORY STATUS COMMUNICATION OF NEEDS Skin   Extensive Assist Verbally PU Stage and Appropriate Care, Bruising     PU Stage 3 Dressing: Daily                 Personal Care Assistance Level of Assistance  Dressing, Feeding, Bathing Bathing Assistance: Limited assistance Feeding assistance: Limited assistance Dressing Assistance: Limited assistance     Functional Limitations Info  Sight, Hearing, Speech Sight Info: Adequate Hearing Info: Adequate Speech Info: Adequate    SPECIAL CARE FACTORS FREQUENCY  PT (By licensed PT)     PT Frequency: 5 x weekly              Contractures Contractures Info: Not present    Additional Factors Info  Code Status Code Status Info: full Allergies Info: ASA           Current Medications (09/25/2019):  This is the current hospital active medication list Current Facility-Administered Medications  Medication Dose Route Frequency Provider Last Rate Last Admin  . acetaminophen (TYLENOL) tablet 650 mg  650 mg Oral Q6H PRN Thurnell Lose, MD   650 mg at 09/25/19 0523  . albuterol (VENTOLIN HFA) 108 (90  Base) MCG/ACT inhaler 2 puff  2 puff Inhalation Q6H PRN Thurnell Lose, MD   2 puff at 09/25/19 0900  . amiodarone (PACERONE) tablet 200 mg  200 mg Oral QHS Thurnell Lose, MD   200 mg at 09/24/19 2109  . bisacodyl (DULCOLAX) EC tablet 5 mg  5 mg Oral Daily Allie Bossier, MD      . cefTRIAXone (ROCEPHIN) 1 g in sodium chloride 0.9 % 100 mL IVPB  1 g Intravenous Q24H Allie Bossier, MD 200 mL/hr at  09/25/19 0910 1 g at 09/25/19 0910  . clopidogrel (PLAVIX) tablet 75 mg  75 mg Oral Daily Thurnell Lose, MD   75 mg at 09/25/19 0859  . collagenase (SANTYL) ointment   Topical Daily Thurnell Lose, MD   Given at 09/25/19 0901  . diphenhydrAMINE (BENADRYL) capsule 25 mg  25 mg Oral Q6H PRN Peyton Bottoms, MD   25 mg at 09/25/19 0028  . docusate sodium (COLACE) capsule 100 mg  100 mg Oral BID Peyton Bottoms, MD   100 mg at 09/25/19 0028  . doxycycline (VIBRA-TABS) tablet 100 mg  100 mg Oral Q12H Allie Bossier, MD   100 mg at 09/25/19 0859  . feeding supplement (ENSURE ENLIVE) (ENSURE ENLIVE) liquid 237 mL  237 mL Oral TID BM Allie Bossier, MD   237 mL at 09/24/19 1943  . finasteride (PROSCAR) tablet 5 mg  5 mg Oral Daily Thurnell Lose, MD   5 mg at 09/25/19 0900  . gabapentin (NEURONTIN) capsule 300 mg  300 mg Oral TID Allie Bossier, MD   300 mg at 09/25/19 0900  . heparin injection 5,000 Units  5,000 Units Subcutaneous Q8H Allie Bossier, MD   5,000 Units at 09/25/19 [phone]  . multivitamin with minerals tablet 1 tablet  1 tablet Oral Daily Allie Bossier, MD   1 tablet at 09/25/19 [phone]  . nitroGLYCERIN (NITROSTAT) SL tablet 0.4 mg  0.4 mg Sublingual Q5 min PRN Thurnell Lose, MD      . ondansetron [facility]) injection 4 mg  4 mg Intravenous Q6H PRN Thurnell Lose, MD      . oxyCODONE (Oxy IR/ROXICODONE) immediate release tablet 15 mg  15 mg Oral Q6H PRN Peyton Bottoms, MD   15 mg at 09/25/19 0716  . pantoprazole (PROTONIX) EC tablet 40 mg  40 mg Oral Daily Thurnell Lose, MD   40 mg at 09/25/19 0913  . polyethylene glycol (MIRALAX / GLYCOLAX) packet 17 g  17 g Oral Daily PRN Thurnell Lose, MD   17 g at 09/24/19 0536  . simvastatin (ZOCOR) tablet 10 mg  10 mg Oral QHS Thurnell Lose, MD   10 mg at 09/24/19 2109  . tamsulosin (FLOMAX) capsule 0.4 mg  0.4 mg Oral Daily Thurnell Lose, MD   0.4 mg at 09/25/19 0900     Discharge Medications: Please see discharge summary  for a list of discharge medications.  Relevant Imaging Results:  Relevant Lab Results:   Additional Information [id number]  Leeroy Cha, RN

## 2019-09-25 NOTE — Evaluation (Signed)
Physical Therapy Evaluation Patient Details Name: Joel Wilson. MRN: [id number] DOB: 1941-05-01 Today's Date: 09/25/2019   History of Present Illness  79 y.o. male admitted on 09/23/19 for pain in buttocks (wound) from [facility].  Pt is COVID (+) but without respiratory symptoms.  He also has AKI, UTI, and stage III scaral decubitus ulcer for which wound care is following.  Pt with significant PMH of PAF, hypertensive heart disease, idiopathic peripheral neuropathy, CAD, neck surgery, L hip replacement.    Clinical Impression  Social worker did confirm that therapy assesment was needed for SNF placement.  Pt supine in bed reporting he has not walked in years, from SNF.  Total care for mobility and ADLs.  He as 2/5 strength and deminished sensation in bil LEs, 3+/5 strength in grip and elbow bil, however deminished shoulder strength 2+-3-/5.  He has a sacral wound and bil heel wounds.  We will defer any further therapy to SNF.     Follow Up Recommendations SNF    Equipment Recommendations  Hospital bed;Other (comment);Wheelchair (measurements PT);Wheelchair cushion (measurements PT)(air mattress overlay, hoyer lift)    Recommendations for Other Services   NA    Precautions / Restrictions Precautions Precautions: Fall;Other (comment)(wounds) Precaution Comments: Pt with stage III sacral wound and unstageable bil heel wounds.                 Pertinent Vitals/Pain Pain Assessment: Faces Faces Pain Scale: Hurts whole lot Pain Location: constant buttocks, legs Pain Descriptors / Indicators: Grimacing;Guarding Pain Intervention(s): Limited activity within patient's tolerance;Premedicated before session;Monitored during session;Repositioned    Home Living Family/patient expects to be discharged to:: Skilled nursing facility                 Additional Comments: Per pt his SNF will not take COVID back    Prior Function Level of Independence: Needs assistance    Gait / Transfers Assistance Needed: Pt reports using w/c x5 years     Comments: bed bound, dependent on mobility and ADLs.      Hand Dominance   Dominant Hand: Left    Extremity/Trunk Assessment   Upper Extremity Assessment Upper Extremity Assessment: RUE deficits/detail;LUE deficits/detail RUE Deficits / Details: bil UE unable to lift greater than 90 degrees with left arm mildly stronger than R.  Elbow strength 3+/5 and ROM normal (+) edema, grip strength good and equal bil .  LUE Deficits / Details: bil UE unable to lift greater than 90 degrees with left arm mildly stronger than R.  Elbow strength 3+/5 and ROM normal (+) edema, grip strength good and equal bil .     Lower Extremity Assessment Lower Extremity Assessment: RLE deficits/detail;LLE deficits/detail RLE Deficits / Details: bil heel wounds padded with foam dressing and floated, ankle ROM to neutral, knee hip ROM mildly limited and strength grossly 2/5 equal bil LEs.   RLE Sensation: history of peripheral neuropathy LLE Deficits / Details: bil heel wounds padded with foam dressing and floated, ankle ROM to neutral, knee hip ROM mildly limited and strength grossly 2/5 equal bil LEs.   LLE Sensation: history of peripheral neuropathy    Cervical / Trunk Assessment Cervical / Trunk Assessment: Other exceptions Cervical / Trunk Exceptions: neck surgery  Communication   Communication: No difficulties  Cognition Arousal/Alertness: Awake/alert Behavior During Therapy: WFL for tasks assessed/performed Overall Cognitive Status: Within Functional Limits for tasks assessed  General Comments: Not specifically tested, but conversation, command following and processing speed seem normal.       General Comments General comments (skin integrity, edema, etc.): Pt on RA with VSS throughout session.         Assessment/Plan    PT Assessment All further PT needs can be met in the  next venue of care  PT Problem List Decreased strength;Decreased activity tolerance;Decreased balance;Decreased mobility;Decreased knowledge of use of DME;Decreased knowledge of precautions;Impaired sensation;Decreased skin integrity;Pain       PT Treatment Interventions      PT Goals (Current goals can be found in the Care Plan section)  Acute Rehab PT Goals Patient Stated Goal: to decrease pain, rest PT Goal Formulation: All assessment and education complete, DC therapy               AM-PAC PT "6 Clicks" Mobility  Outcome Measure Help needed turning from your back to your side while in a flat bed without using bedrails?: Total Help needed moving from lying on your back to sitting on the side of a flat bed without using bedrails?: Total Help needed moving to and from a bed to a chair (including a wheelchair)?: Total Help needed standing up from a chair using your arms (e.g., wheelchair or bedside chair)?: Total Help needed to walk in hospital room?: Total Help needed climbing 3-5 steps with a railing? : Total 6 Click Score: 6    End of Session   Activity Tolerance: Patient limited by pain Patient left: in bed;with call bell/phone within reach   PT Visit Diagnosis: Muscle weakness (generalized) (M62.81);Difficulty in walking, not elsewhere classified (R26.2);Pain Pain - Right/Left: (buttocks and bil LEs) Pain - part of body: (buttocks and bil LEs)    Time: 0931-1216 PT Time Calculation (min) (ACUTE ONLY): 12 min   Charges:         Verdene Lennert, PT, DPT  Acute Rehabilitation [phone] pager #[phone] office  @ [place]: [phone]   PT Evaluation $PT Eval Moderate Complexity: 1 Mod         09/25/2019, 9:48 AM

## 2019-09-26 DIAGNOSIS — S21239D Puncture wound without foreign body of unspecified back wall of thorax without penetration into thoracic cavity, subsequent encounter: Secondary | ICD-10-CM

## 2019-09-26 DIAGNOSIS — W3400XD Accidental discharge from unspecified firearms or gun, subsequent encounter: Secondary | ICD-10-CM

## 2019-09-26 DIAGNOSIS — S21239A Puncture wound without foreign body of unspecified back wall of thorax without penetration into thoracic cavity, initial encounter: Secondary | ICD-10-CM

## 2019-09-26 DIAGNOSIS — W3400XA Accidental discharge from unspecified firearms or gun, initial encounter: Secondary | ICD-10-CM

## 2019-09-26 LAB — CBC WITH DIFFERENTIAL/PLATELET
Abs Immature Granulocytes: 0.05 10*3/uL (ref 0.00–0.07)
Basophils Absolute: 0 10*3/uL (ref 0.0–0.1)
Basophils Relative: 0 %
Eosinophils Absolute: 0.4 10*3/uL (ref 0.0–0.5)
Eosinophils Relative: 5 %
HCT: 35.2 % — ABNORMAL LOW (ref 39.0–52.0)
Hemoglobin: 11.4 g/dL — ABNORMAL LOW (ref 13.0–17.0)
Immature Granulocytes: 1 %
Lymphocytes Relative: 19 %
Lymphs Abs: 1.7 10*3/uL (ref 0.7–4.0)
MCH: 31.5 pg (ref 26.0–34.0)
MCHC: 32.4 g/dL (ref 30.0–36.0)
MCV: 97.2 fL (ref 80.0–100.0)
Monocytes Absolute: 1 10*3/uL (ref 0.1–1.0)
Monocytes Relative: 11 %
Neutro Abs: 5.8 10*3/uL (ref 1.7–7.7)
Neutrophils Relative %: 64 %
Platelets: 162 10*3/uL (ref 150–400)
RBC: 3.62 MIL/uL — ABNORMAL LOW (ref 4.22–5.81)
RDW: 13.3 % (ref 11.5–15.5)
WBC: 8.9 10*3/uL (ref 4.0–10.5)
nRBC: 0 % (ref 0.0–0.2)

## 2019-09-26 LAB — COMPREHENSIVE METABOLIC PANEL
ALT: 33 U/L (ref 0–44)
AST: 32 U/L (ref 15–41)
Albumin: 2.4 g/dL — ABNORMAL LOW (ref 3.5–5.0)
Alkaline Phosphatase: 42 U/L (ref 38–126)
Anion gap: 11 (ref 5–15)
BUN: 20 mg/dL (ref 8–23)
CO2: 23 mmol/L (ref 22–32)
Calcium: 8.5 mg/dL — ABNORMAL LOW (ref 8.9–10.3)
Chloride: 103 mmol/L (ref 98–111)
Creatinine, Ser: 1.05 mg/dL (ref 0.61–1.24)
GFR calc Af Amer: >60 mL/min (ref >60)
GFR calc non Af Amer: >60 mL/min (ref >60)
Glucose, Bld: 76 mg/dL (ref 70–99)
Potassium: 3.8 mmol/L (ref 3.5–5.1)
Sodium: 137 mmol/L (ref 135–145)
Total Bilirubin: 0.9 mg/dL (ref 0.3–1.2)
Total Protein: 5.7 g/dL — ABNORMAL LOW (ref 6.5–8.1)

## 2019-09-26 LAB — MAGNESIUM: Magnesium: 1.8 mg/dL (ref 1.7–2.4)

## 2019-09-26 LAB — FERRITIN: Ferritin: 368 ng/mL — ABNORMAL HIGH (ref 24–336)

## 2019-09-26 LAB — PHOSPHORUS: Phosphorus: 2.3 mg/dL — ABNORMAL LOW (ref 2.5–4.6)

## 2019-09-26 LAB — PROCALCITONIN: Procalcitonin: <0.1 ng/mL

## 2019-09-26 LAB — BRAIN NATRIURETIC PEPTIDE: B Natriuretic Peptide: 96.9 pg/mL (ref 0.0–100.0)

## 2019-09-26 LAB — C-REACTIVE PROTEIN: CRP: 5.9 mg/dL — ABNORMAL HIGH (ref <1.0)

## 2019-09-26 LAB — D-DIMER, QUANTITATIVE: D-Dimer, Quant: 2.27 ug/mL-FEU — ABNORMAL HIGH (ref 0.00–0.50)
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
## 2019-09-26 NOTE — TOC Progression Note (Signed)
Transition of Care (TOC) - Progression Note    Patient Details  Name: Joel Wilson. MRN: [id number] Date of Birth: [birth year]  Transition of Care [facility]) CM/SW Contact  Bartholomew Crews, RN Phone Number: [phone] 09/26/2019, 3:44 PM  Clinical Narrative:    Notified by MD of patient's readiness for discharge - MD wanting to discharge patient in the morning 1/24. [employer], they can accept him back to room 37A. Nurse can call report tomorrow morning to [phone]. TOC following.         Expected Discharge Plan and Services                                                 Social Determinants of Health (SDOH) Interventions    Readmission Risk Interventions Readmission Risk Prevention Plan 09/08/2019 09/06/2019  Transportation Screening Complete Complete  PCP or Specialist Appt within 3-5 Days (No Data) -  HRI or [place] - Patient refused  Social Work Consult for [place] Planning/Counseling - Complete  Palliative Care Screening - Not Applicable  Medication Review Press photographer) Complete Complete  Some recent data might be hidden

## 2019-09-26 NOTE — Plan of Care (Signed)
  Problem: Education: Goal: Knowledge of risk factors and measures for prevention of condition will improve Outcome: Progressing   Problem: Coping: Goal: Psychosocial and spiritual needs will be supported Outcome: Progressing   Problem: Respiratory: Goal: Will maintain a patent airway Outcome: Progressing Goal: Complications related to the disease process, condition or treatment will be avoided or minimized Outcome: Progressing   

## 2019-09-27 DIAGNOSIS — S21232D Puncture wound without foreign body of left back wall of thorax without penetration into thoracic cavity, subsequent encounter: Secondary | ICD-10-CM

## 2019-09-27 DIAGNOSIS — L89153 Pressure ulcer of sacral region, stage 3: Principal | ICD-10-CM

## 2019-09-27 LAB — CBC WITH DIFFERENTIAL/PLATELET
Abs Immature Granulocytes: 0.06 10*3/uL (ref 0.00–0.07)
Basophils Absolute: 0 10*3/uL (ref 0.0–0.1)
Basophils Relative: 0 %
Eosinophils Absolute: 0.4 10*3/uL (ref 0.0–0.5)
Eosinophils Relative: 6 %
HCT: 30.5 % — ABNORMAL LOW (ref 39.0–52.0)
Hemoglobin: 10.1 g/dL — ABNORMAL LOW (ref 13.0–17.0)
Immature Granulocytes: 1 %
Lymphocytes Relative: 21 %
Lymphs Abs: 1.5 10*3/uL (ref 0.7–4.0)
MCH: 32 pg (ref 26.0–34.0)
MCHC: 33.1 g/dL (ref 30.0–36.0)
MCV: 96.5 fL (ref 80.0–100.0)
Monocytes Absolute: 0.7 10*3/uL (ref 0.1–1.0)
Monocytes Relative: 10 %
Neutro Abs: 4.3 10*3/uL (ref 1.7–7.7)
Neutrophils Relative %: 62 %
Platelets: 157 10*3/uL (ref 150–400)
RBC: 3.16 MIL/uL — ABNORMAL LOW (ref 4.22–5.81)
RDW: 13.3 % (ref 11.5–15.5)
WBC: 7 10*3/uL (ref 4.0–10.5)
nRBC: 0 % (ref 0.0–0.2)

## 2019-09-27 LAB — COMPREHENSIVE METABOLIC PANEL
ALT: 33 U/L (ref 0–44)
AST: 30 U/L (ref 15–41)
Albumin: 2.2 g/dL — ABNORMAL LOW (ref 3.5–5.0)
Alkaline Phosphatase: 35 U/L — ABNORMAL LOW (ref 38–126)
Anion gap: 9 (ref 5–15)
BUN: 16 mg/dL (ref 8–23)
CO2: 25 mmol/L (ref 22–32)
Calcium: 8.2 mg/dL — ABNORMAL LOW (ref 8.9–10.3)
Chloride: 103 mmol/L (ref 98–111)
Creatinine, Ser: 0.88 mg/dL (ref 0.61–1.24)
GFR calc Af Amer: >60 mL/min (ref >60)
GFR calc non Af Amer: >60 mL/min (ref >60)
Glucose, Bld: 79 mg/dL (ref 70–99)
Potassium: 3.6 mmol/L (ref 3.5–5.1)
Sodium: 137 mmol/L (ref 135–145)
Total Bilirubin: 0.9 mg/dL (ref 0.3–1.2)
Total Protein: 5 g/dL — ABNORMAL LOW (ref 6.5–8.1)

## 2019-09-27 LAB — PHOSPHORUS: Phosphorus: 2 mg/dL — ABNORMAL LOW (ref 2.5–4.6)

## 2019-09-27 LAB — CULTURE, BLOOD (ROUTINE X 2)
Culture: NO GROWTH
Special Requests: ADEQUATE

## 2019-09-27 LAB — PROCALCITONIN: Procalcitonin: <0.1 ng/mL

## 2019-09-27 LAB — C-REACTIVE PROTEIN: CRP: 3.3 mg/dL — ABNORMAL HIGH (ref <1.0)

## 2019-09-27 LAB — FERRITIN: Ferritin: 270 ng/mL (ref 24–336)

## 2019-09-27 LAB — MAGNESIUM: Magnesium: 1.6 mg/dL — ABNORMAL LOW (ref 1.7–2.4)

## 2019-09-27 LAB — D-DIMER, QUANTITATIVE: D-Dimer, Quant: 1.46 ug/mL-FEU — ABNORMAL HIGH (ref 0.00–0.50)

## 2019-09-27 LAB — BRAIN NATRIURETIC PEPTIDE: B Natriuretic Peptide: 198.8 pg/mL — ABNORMAL HIGH (ref 0.0–100.0)

## 2019-09-27 MED ORDER — DIPHENHYDRAMINE HCL 25 MG PO CAPS: 25.0000 mg | ORAL_CAPSULE | Freq: Four times a day (QID) | ORAL | 0 refills | Status: DC | PRN

## 2019-09-27 MED ORDER — ALBUTEROL SULFATE HFA 108 (90 BASE) MCG/ACT IN AERS: 2.0000 | INHALATION_SPRAY | Freq: Four times a day (QID) | RESPIRATORY_TRACT | 0 refills | Status: DC | PRN

## 2019-09-27 MED ORDER — CEFIXIME 400 MG PO CAPS
400.0000 mg | ORAL_CAPSULE | Freq: Every day | ORAL | Status: DC
  Filled 2019-09-27: qty 1

## 2019-09-27 MED ORDER — BISACODYL 5 MG PO TBEC: 5.0000 mg | DELAYED_RELEASE_TABLET | Freq: Every day | ORAL | 0 refills | Status: DC

## 2019-09-27 MED ORDER — COLLAGENASE 250 UNIT/GM EX OINT: TOPICAL_OINTMENT | Freq: Every day | CUTANEOUS | 0 refills | Status: AC

## 2019-09-27 MED ORDER — ADULT MULTIVITAMIN W/MINERALS CH: 1.0000 | ORAL_TABLET | Freq: Every day | ORAL | 0 refills | Status: DC

## 2019-09-27 MED ORDER — CEFDINIR 300 MG PO CAPS
300.0000 mg | ORAL_CAPSULE | Freq: Two times a day (BID) | ORAL | Status: DC
  Administered 2019-09-27: 300 mg via ORAL
  Filled 2019-09-27 (×2): qty 1

## 2019-09-27 MED ORDER — ENSURE ENLIVE PO LIQD: 237.0000 mL | Freq: Three times a day (TID) | ORAL | 0 refills | Status: DC

## 2019-09-27 MED ORDER — DOCUSATE SODIUM 100 MG PO CAPS: 100.0000 mg | ORAL_CAPSULE | Freq: Two times a day (BID) | ORAL | 0 refills | Status: DC

## 2019-09-27 MED ORDER — GABAPENTIN 300 MG PO CAPS: 300.0000 mg | ORAL_CAPSULE | Freq: Three times a day (TID) | ORAL | 0 refills | Status: DC

## 2019-09-27 MED ORDER — DOXYCYCLINE HYCLATE 100 MG PO TABS: 100.0000 mg | ORAL_TABLET | Freq: Two times a day (BID) | ORAL | 0 refills | Status: DC

## 2019-09-27 MED ORDER — CEFDINIR 300 MG PO CAPS: 300.0000 mg | ORAL_CAPSULE | Freq: Two times a day (BID) | ORAL | 0 refills | Status: DC

## 2019-09-27 MED ORDER — MAGNESIUM SULFATE 2 GM/50ML IV SOLN
2.0000 g | Freq: Once | INTRAVENOUS | Status: AC
  Administered 2019-09-27: 2 g via INTRAVENOUS
  Filled 2019-09-27: qty 50

## 2019-09-27 NOTE — Progress Notes (Signed)
Report has been called to [facility] received report

## 2019-09-27 NOTE — Discharge Summary (Signed)
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 RESISTANT Resistant     CEFAZOLIN <=4 SENSITIVE Sensitive     CEFTRIAXONE <=0.25 SENSITIVE Sensitive     CIPROFLOXACIN <=0.25 SENSITIVE Sensitive     GENTAMICIN <=1 SENSITIVE Sensitive     IMIPENEM <=0.25 SENSITIVE Sensitive     NITROFURANTOIN 32 SENSITIVE Sensitive     TRIMETH/SULFA <=20 SENSITIVE Sensitive     AMPICILLIN/SULBACTAM 8 SENSITIVE Sensitive     PIP/TAZO 8 SENSITIVE Sensitive     * >=100,000 COLONIES/mL KLEBSIELLA PNEUMONIAE     Labs: Basic Metabolic Panel: Recent Labs  Lab 09/23/19 0131 09/24/19 0125 09/25/19 0027 09/26/19 0142 09/27/19 0326  NA 133* 137  138 137 137  K 4.4 4.2 4.1 3.8 3.6  CL 99 103 106 103 103  CO2 24 24 22 23 25   GLUCOSE 116* 99 104* 76 79  BUN 47* 31* 27* 20 16  CREATININE 2.19* 1.32* 1.32* 1.05 0.88  CALCIUM 7.9* 8.4* 8.4* 8.5*  8.2*  MG 2.1 1.9 2.0 1.8 1.6*  PHOS  --   --  2.0* 2.3* 2.0*   Liver Function Tests: Recent Labs  Lab 09/23/19 0131 09/24/19 0125 09/25/19 0027 09/26/19 0142 09/27/19 0326  AST 46* 34 29 32 30  ALT 33 32 30 33 33  ALKPHOS 34* 41 38 42 35*  BILITOT 1.1 1.2 0.7 0.9 0.9  PROT 5.2* 5.5* 5.5* 5.7* 5.0*  ALBUMIN 2.3* 2.4* 2.3* 2.4* 2.2*   No results for input(s): LIPASE, AMYLASE in the last 168 hours. No results for input(s): AMMONIA in the last 168 hours. CBC: Recent Labs  Lab 09/22/19 1302 09/22/19 1302 09/23/19 0131 09/24/19 0125 09/25/19 0027 09/26/19 0142 09/27/19 0326  WBC 16.5*   < > 14.5* 14.0* 11.1* 8.9 7.0  NEUTROABS 13.2*  --   --  11.1* 7.7 5.8 4.3  HGB 13.4   < > 11.5* 12.0* 11.2* 11.4* 10.1*  HCT 40.7   < > 34.6* 36.5* 33.8* 35.2* 30.5*  MCV 95.3   < > 95.6 96.6 96.0 97.2 96.5  PLT 221   < > 170 175 181 162 157   < > = values in this interval not displayed.   Cardiac Enzymes: No results for input(s): CKTOTAL, CKMB, CKMBINDEX, TROPONINI in the last 168 hours. BNP: BNP (last 3 results) Recent Labs    09/25/19 0027 09/26/19 0142 09/27/19 0326  BNP 174.6* 96.9 198.8*    ProBNP (last 3 results) No results for input(s): PROBNP in the last 8760 hours.  CBG: No results for input(s): GLUCAP in the last 168 hours.     Signed:  Dia Crawford, MD [facility] [phone] pager

## 2019-09-27 NOTE — Progress Notes (Signed)
Patient was picked up by transport and This RN called his son Stormy Card to let him know his father was transferred.

## 2019-09-27 NOTE — TOC Transition Note (Addendum)
Transition of Care [facility]) - CM/SW Discharge Note Marvetta Gibbons RN, BSN Transitions of Care Unit 4E- RN Case Manager ([employer] Weekend Coverage) [phone]   Patient Details  Name: Joel Wilson. MRN: [id number] Date of Birth: 1941-07-01  Transition of Care [facility]) CM/SW Contact:  Dawayne Patricia, RN Phone Number: 09/27/2019, 10:44 AM   Clinical Narrative:    Pt stable for transition today to SNF. CM has spoken with son Joel Wilson regarding transition plan and Joel Wilson is agreeable to transition to [facility], he did ask about an air mattress for pt at facility and CM will f/u on that. Spoke with [facility] admissions coordinator for [facility] who has confirmed that they can receive pt today- pt's room # will be 37A. - asked Joel Wilson about air mattress for pt- and was informed that they will have to order one for pt but as long as MD puts the need in d/c summary they can order on tomorrow. - have notified MD to place need for air mattress in d/c summary. - have sent msg to bedside RN regarding readiness to call [facility]- once bedside care is completed RN will let this CM know when they are ready for [facility] to be called for transport. Paperwork for transport has been printed to the unit. D/C summary has been faxed to [facility] via epic hub. Will ask bedside RN to call son once Joel Wilson has arrived for transport.   Phone # for RN to call report to Raynelle [phone]-  [facility] been called for transport- paperwork with unit.   Final next level of care: Skilled Nursing Facility Barriers to Discharge: No Barriers Identified   Patient Goals and CMS Choice Patient states their goals for this hospitalization and ongoing recovery are:: per son- return to [place] Medicare.gov Compare Post Acute Care list provided to:: Patient Represenative (must comment)(son) Choice offered to / list presented to : Adult Children  Discharge  Placement                 SNF- [facility]      Discharge Plan and Services In-house Referral: Clinical Social Work Discharge Planning Services: CM Consult Post Acute Care Choice: Nursing Home          DME Arranged: N/A DME Agency: NA         HH Agency: NA        Social Determinants of Health (SDOH) Interventions     Readmission Risk Interventions Readmission Risk Prevention Plan 09/27/2019 09/08/2019 09/06/2019  Transportation Screening Complete Complete Complete  PCP or Specialist Appt within 3-5 Days Complete (No Data) -  [place] or Home Care Consult Complete - Patient refused  Social Work Consult for [place] Planning/Counseling Complete - Complete  Palliative Care Screening Not Applicable - Not Applicable  Medication Review Press photographer) Complete Complete Complete  Some recent data might be hidden

## 2019-09-27 NOTE — Progress Notes (Signed)
Pt refusing heel boot protectors as well as IV fluids.

## 2019-10-05 ENCOUNTER — Other Ambulatory Visit: Payer: Self-pay | Admitting: Internal Medicine

## 2019-10-05 DIAGNOSIS — U071 COVID-19: Secondary | ICD-10-CM

## 2019-10-19 ENCOUNTER — Other Ambulatory Visit: Payer: Self-pay | Admitting: Internal Medicine

## 2019-12-19 ENCOUNTER — Other Ambulatory Visit: Payer: Self-pay

## 2019-12-19 ENCOUNTER — Inpatient Hospital Stay: Payer: Medicare Other

## 2019-12-19 ENCOUNTER — Emergency Department: Payer: Medicare Other

## 2019-12-19 ENCOUNTER — Inpatient Hospital Stay
Admission: EM | Admit: 2019-12-19 | Discharge: 2019-12-23 | DRG: 580 | Disposition: A | Discharge status: Hospice | Payer: Medicare Other | Source: Skilled Nursing Facility | Attending: Internal Medicine | Admitting: Internal Medicine

## 2019-12-19 DIAGNOSIS — I129 Hypertensive chronic kidney disease with stage 1 through stage 4 chronic kidney disease, or unspecified chronic kidney disease: Secondary | ICD-10-CM | POA: Diagnosis not present

## 2019-12-19 DIAGNOSIS — N400 Enlarged prostate without lower urinary tract symptoms: Secondary | ICD-10-CM | POA: Diagnosis present

## 2019-12-19 DIAGNOSIS — E559 Vitamin D deficiency, unspecified: Secondary | ICD-10-CM | POA: Diagnosis present

## 2019-12-19 DIAGNOSIS — K219 Gastro-esophageal reflux disease without esophagitis: Secondary | ICD-10-CM | POA: Diagnosis present

## 2019-12-19 DIAGNOSIS — R3914 Feeling of incomplete bladder emptying: Secondary | ICD-10-CM | POA: Diagnosis not present

## 2019-12-19 DIAGNOSIS — Z993 Dependence on wheelchair: Secondary | ICD-10-CM | POA: Diagnosis not present

## 2019-12-19 DIAGNOSIS — G629 Polyneuropathy, unspecified: Secondary | ICD-10-CM | POA: Diagnosis present

## 2019-12-19 DIAGNOSIS — Z978 Presence of other specified devices: Secondary | ICD-10-CM

## 2019-12-19 DIAGNOSIS — L89619 Pressure ulcer of right heel, unspecified stage: Secondary | ICD-10-CM

## 2019-12-19 DIAGNOSIS — Z87891 Personal history of nicotine dependence: Secondary | ICD-10-CM

## 2019-12-19 DIAGNOSIS — G8220 Paraplegia, unspecified: Secondary | ICD-10-CM | POA: Diagnosis not present

## 2019-12-19 DIAGNOSIS — L89622 Pressure ulcer of left heel, stage 2: Secondary | ICD-10-CM | POA: Diagnosis present

## 2019-12-19 DIAGNOSIS — B965 Pseudomonas (aeruginosa) (mallei) (pseudomallei) as the cause of diseases classified elsewhere: Secondary | ICD-10-CM | POA: Diagnosis present

## 2019-12-19 DIAGNOSIS — Z66 Do not resuscitate: Secondary | ICD-10-CM | POA: Diagnosis not present

## 2019-12-19 DIAGNOSIS — L89629 Pressure ulcer of left heel, unspecified stage: Secondary | ICD-10-CM

## 2019-12-19 DIAGNOSIS — L89611 Pressure ulcer of right heel, stage 1: Secondary | ICD-10-CM | POA: Diagnosis present

## 2019-12-19 DIAGNOSIS — L089 Local infection of the skin and subcutaneous tissue, unspecified: Secondary | ICD-10-CM | POA: Diagnosis not present

## 2019-12-19 DIAGNOSIS — Z79891 Long term (current) use of opiate analgesic: Secondary | ICD-10-CM

## 2019-12-19 DIAGNOSIS — B952 Enterococcus as the cause of diseases classified elsewhere: Secondary | ICD-10-CM | POA: Diagnosis present

## 2019-12-19 DIAGNOSIS — Z515 Encounter for palliative care: Secondary | ICD-10-CM | POA: Diagnosis not present

## 2019-12-19 DIAGNOSIS — Z8616 Personal history of COVID-19: Secondary | ICD-10-CM

## 2019-12-19 DIAGNOSIS — Z7189 Other specified counseling: Secondary | ICD-10-CM | POA: Diagnosis not present

## 2019-12-19 DIAGNOSIS — T148XXA Other injury of unspecified body region, initial encounter: Secondary | ICD-10-CM

## 2019-12-19 DIAGNOSIS — I82409 Acute embolism and thrombosis of unspecified deep veins of unspecified lower extremity: Secondary | ICD-10-CM

## 2019-12-19 DIAGNOSIS — G609 Hereditary and idiopathic neuropathy, unspecified: Secondary | ICD-10-CM | POA: Diagnosis present

## 2019-12-19 DIAGNOSIS — I480 Paroxysmal atrial fibrillation: Secondary | ICD-10-CM | POA: Diagnosis present

## 2019-12-19 DIAGNOSIS — N401 Enlarged prostate with lower urinary tract symptoms: Secondary | ICD-10-CM | POA: Diagnosis present

## 2019-12-19 DIAGNOSIS — N179 Acute kidney failure, unspecified: Secondary | ICD-10-CM | POA: Diagnosis present

## 2019-12-19 DIAGNOSIS — Z96642 Presence of left artificial hip joint: Secondary | ICD-10-CM | POA: Diagnosis present

## 2019-12-19 DIAGNOSIS — Z79899 Other long term (current) drug therapy: Secondary | ICD-10-CM

## 2019-12-19 DIAGNOSIS — L89154 Pressure ulcer of sacral region, stage 4: Secondary | ICD-10-CM | POA: Diagnosis present

## 2019-12-19 DIAGNOSIS — E872 Acidosis: Secondary | ICD-10-CM | POA: Diagnosis present

## 2019-12-19 DIAGNOSIS — E876 Hypokalemia: Secondary | ICD-10-CM | POA: Diagnosis present

## 2019-12-19 DIAGNOSIS — G8929 Other chronic pain: Secondary | ICD-10-CM | POA: Diagnosis present

## 2019-12-19 DIAGNOSIS — E785 Hyperlipidemia, unspecified: Secondary | ICD-10-CM | POA: Diagnosis present

## 2019-12-19 DIAGNOSIS — Z7902 Long term (current) use of antithrombotics/antiplatelets: Secondary | ICD-10-CM | POA: Diagnosis not present

## 2019-12-19 DIAGNOSIS — N189 Chronic kidney disease, unspecified: Secondary | ICD-10-CM | POA: Diagnosis not present

## 2019-12-19 DIAGNOSIS — I2510 Atherosclerotic heart disease of native coronary artery without angina pectoris: Secondary | ICD-10-CM | POA: Diagnosis present

## 2019-12-19 DIAGNOSIS — I4891 Unspecified atrial fibrillation: Secondary | ICD-10-CM | POA: Diagnosis present

## 2019-12-19 DIAGNOSIS — I10 Essential (primary) hypertension: Secondary | ICD-10-CM | POA: Diagnosis not present

## 2019-12-19 LAB — COMPREHENSIVE METABOLIC PANEL
ALT: 24 U/L (ref 0–44)
AST: 31 U/L (ref 15–41)
Albumin: 2.1 g/dL — ABNORMAL LOW (ref 3.5–5.0)
Alkaline Phosphatase: 58 U/L (ref 38–126)
Anion gap: 15 (ref 5–15)
BUN: 52 mg/dL — ABNORMAL HIGH (ref 8–23)
CO2: 28 mmol/L (ref 22–32)
Calcium: 8.3 mg/dL — ABNORMAL LOW (ref 8.9–10.3)
Chloride: 95 mmol/L — ABNORMAL LOW (ref 98–111)
Creatinine, Ser: 2.12 mg/dL — ABNORMAL HIGH (ref 0.61–1.24)
GFR calc Af Amer: 33 mL/min — ABNORMAL LOW (ref >60)
GFR calc non Af Amer: 29 mL/min — ABNORMAL LOW (ref >60)
Glucose, Bld: 129 mg/dL — ABNORMAL HIGH (ref 70–99)
Potassium: 2.6 mmol/L — CL (ref 3.5–5.1)
Sodium: 138 mmol/L (ref 135–145)
Total Bilirubin: 1.2 mg/dL (ref 0.3–1.2)
Total Protein: 7 g/dL (ref 6.5–8.1)

## 2019-12-19 LAB — URINALYSIS, COMPLETE (UACMP) WITH MICROSCOPIC
Bacteria, UA: NONE SEEN
Bilirubin Urine: NEGATIVE
Glucose, UA: NEGATIVE mg/dL
Hgb urine dipstick: NEGATIVE
Ketones, ur: NEGATIVE mg/dL
Nitrite: NEGATIVE
Protein, ur: NEGATIVE mg/dL
Specific Gravity, Urine: 1.012 (ref 1.005–1.030)
WBC, UA: >50 WBC/hpf — ABNORMAL HIGH (ref 0–5)
pH: 5 (ref 5.0–8.0)

## 2019-12-19 LAB — CBC WITH DIFFERENTIAL/PLATELET
Abs Immature Granulocytes: 0.2 10*3/uL — ABNORMAL HIGH (ref 0.00–0.07)
Basophils Absolute: 0 10*3/uL (ref 0.0–0.1)
Basophils Relative: 0 %
Eosinophils Absolute: 0 10*3/uL (ref 0.0–0.5)
Eosinophils Relative: 0 %
HCT: 34.7 % — ABNORMAL LOW (ref 39.0–52.0)
Hemoglobin: 11.3 g/dL — ABNORMAL LOW (ref 13.0–17.0)
Immature Granulocytes: 1 %
Lymphocytes Relative: 12 %
Lymphs Abs: 3.1 10*3/uL (ref 0.7–4.0)
MCH: 29.8 pg (ref 26.0–34.0)
MCHC: 32.6 g/dL (ref 30.0–36.0)
MCV: 91.6 fL (ref 80.0–100.0)
Monocytes Absolute: 1.1 10*3/uL — ABNORMAL HIGH (ref 0.1–1.0)
Monocytes Relative: 4 %
Neutro Abs: 20.6 10*3/uL — ABNORMAL HIGH (ref 1.7–7.7)
Neutrophils Relative %: 83 %
Platelets: 477 10*3/uL — ABNORMAL HIGH (ref 150–400)
RBC: 3.79 MIL/uL — ABNORMAL LOW (ref 4.22–5.81)
RDW: 15.4 % (ref 11.5–15.5)
WBC: 25 10*3/uL — ABNORMAL HIGH (ref 4.0–10.5)
nRBC: 0 % (ref 0.0–0.2)

## 2019-12-19 LAB — PROTIME-INR
INR: 1.2 (ref 0.8–1.2)
Prothrombin Time: 15.4 seconds — ABNORMAL HIGH (ref 11.4–15.2)

## 2019-12-19 LAB — LACTIC ACID, PLASMA
Lactic Acid, Venous: 2.5 mmol/L (ref 0.5–1.9)
Lactic Acid, Venous: 2.2 mmol/L (ref 0.5–1.9)

## 2019-12-19 LAB — APTT: aPTT: 32 seconds (ref 24–36)

## 2019-12-19 LAB — RESPIRATORY PANEL BY RT PCR (FLU A&B, COVID)
Influenza A by PCR: NEGATIVE
Influenza B by PCR: NEGATIVE
SARS Coronavirus 2 by RT PCR: NEGATIVE

## 2019-12-19 LAB — MAGNESIUM: Magnesium: 1.8 mg/dL (ref 1.7–2.4)

## 2019-12-19 MED ORDER — GABAPENTIN 300 MG PO CAPS
300.0000 mg | ORAL_CAPSULE | Freq: Three times a day (TID) | ORAL | Status: DC
  Administered 2019-12-19 – 2019-12-21 (×7): 300 mg via ORAL
  Filled 2019-12-19 (×8): qty 1

## 2019-12-19 MED ORDER — POTASSIUM CHLORIDE CRYS ER 20 MEQ PO TBCR
40.0000 meq | EXTENDED_RELEASE_TABLET | Freq: Once | ORAL | Status: AC
  Administered 2019-12-19: 40 meq via ORAL
  Filled 2019-12-19: qty 2

## 2019-12-19 MED ORDER — SODIUM CHLORIDE 0.9 % IV SOLN
2.0000 g | Freq: Two times a day (BID) | INTRAVENOUS | Status: DC
  Administered 2019-12-19 – 2019-12-21 (×4): 2 g via INTRAVENOUS
  Filled 2019-12-19 (×6): qty 2

## 2019-12-19 MED ORDER — VANCOMYCIN HCL 750 MG/150ML IV SOLN
750.0000 mg | INTRAVENOUS | Status: DC
  Administered 2019-12-20: 750 mg via INTRAVENOUS
  Filled 2019-12-19: qty 150

## 2019-12-19 MED ORDER — SIMVASTATIN 10 MG PO TABS
10.0000 mg | ORAL_TABLET | Freq: Every day | ORAL | Status: DC
  Administered 2019-12-19 – 2019-12-21 (×3): 10 mg via ORAL
  Filled 2019-12-19 (×4): qty 1

## 2019-12-19 MED ORDER — BISACODYL 5 MG PO TBEC
5.0000 mg | DELAYED_RELEASE_TABLET | Freq: Every day | ORAL | Status: DC
  Administered 2019-12-19 – 2019-12-21 (×3): 5 mg via ORAL
  Filled 2019-12-19 (×3): qty 1

## 2019-12-19 MED ORDER — METRONIDAZOLE IN NACL 5-0.79 MG/ML-% IV SOLN
500.0000 mg | Freq: Once | INTRAVENOUS | Status: AC
  Administered 2019-12-19: 500 mg via INTRAVENOUS
  Filled 2019-12-19: qty 100

## 2019-12-19 MED ORDER — AMIODARONE HCL 200 MG PO TABS
200.0000 mg | ORAL_TABLET | Freq: Every day | ORAL | Status: DC
  Administered 2019-12-19 – 2019-12-21 (×3): 200 mg via ORAL
  Filled 2019-12-19 (×4): qty 1

## 2019-12-19 MED ORDER — OXYCODONE HCL 5 MG PO TABS
10.0000 mg | ORAL_TABLET | Freq: Three times a day (TID) | ORAL | Status: DC
  Administered 2019-12-19 – 2019-12-21 (×7): 10 mg via ORAL
  Filled 2019-12-19 (×8): qty 2

## 2019-12-19 MED ORDER — POTASSIUM CHLORIDE 10 MEQ/100ML IV SOLN
10.0000 meq | Freq: Once | INTRAVENOUS | Status: AC
  Administered 2019-12-19: 10 meq via INTRAVENOUS
  Filled 2019-12-19: qty 100

## 2019-12-19 MED ORDER — TAMSULOSIN HCL 0.4 MG PO CAPS
0.4000 mg | ORAL_CAPSULE | Freq: Every day | ORAL | Status: DC
  Administered 2019-12-19 – 2019-12-21 (×3): 0.4 mg via ORAL
  Filled 2019-12-19 (×3): qty 1

## 2019-12-19 MED ORDER — POTASSIUM CHLORIDE IN NACL 40-0.9 MEQ/L-% IV SOLN
INTRAVENOUS | Status: DC
  Administered 2019-12-19 – 2019-12-20 (×2): 125 mL/h via INTRAVENOUS
  Filled 2019-12-19 (×5): qty 1000

## 2019-12-19 MED ORDER — LIDOCAINE 5 % EX PTCH
1.0000 | MEDICATED_PATCH | Freq: Every day | CUTANEOUS | Status: DC
  Administered 2019-12-20 – 2019-12-22 (×3): 1 via TRANSDERMAL
  Filled 2019-12-19 (×4): qty 1

## 2019-12-19 MED ORDER — ONDANSETRON HCL 4 MG/2ML IJ SOLN
4.0000 mg | Freq: Once | INTRAMUSCULAR | Status: AC
  Administered 2019-12-19: 4 mg via INTRAVENOUS
  Filled 2019-12-19: qty 2

## 2019-12-19 MED ORDER — CLOPIDOGREL BISULFATE 75 MG PO TABS
75.0000 mg | ORAL_TABLET | Freq: Every day | ORAL | Status: DC
  Administered 2019-12-19: 75 mg via ORAL
  Filled 2019-12-19: qty 1

## 2019-12-19 MED ORDER — VANCOMYCIN HCL IN DEXTROSE 1-5 GM/200ML-% IV SOLN
1000.0000 mg | Freq: Once | INTRAVENOUS | Status: AC
  Administered 2019-12-19: 1000 mg via INTRAVENOUS
  Filled 2019-12-19: qty 200

## 2019-12-19 MED ORDER — FINASTERIDE 5 MG PO TABS
5.0000 mg | ORAL_TABLET | Freq: Every day | ORAL | Status: DC
  Administered 2019-12-19 – 2019-12-21 (×3): 5 mg via ORAL
  Filled 2019-12-19 (×4): qty 1

## 2019-12-19 MED ORDER — SODIUM CHLORIDE 0.9 % IV SOLN
2.0000 g | Freq: Once | INTRAVENOUS | Status: AC
  Administered 2019-12-19: 2 g via INTRAVENOUS
  Filled 2019-12-19: qty 2

## 2019-12-19 MED ORDER — HEPARIN SODIUM (PORCINE) 5000 UNIT/ML IJ SOLN
5000.0000 [IU] | Freq: Three times a day (TID) | INTRAMUSCULAR | Status: DC
  Administered 2019-12-19 (×2): 5000 [IU] via SUBCUTANEOUS
  Filled 2019-12-19 (×2): qty 1

## 2019-12-19 MED ORDER — MORPHINE SULFATE (PF) 4 MG/ML IV SOLN
4.0000 mg | Freq: Once | INTRAVENOUS | Status: AC
  Administered 2019-12-19: 4 mg via INTRAVENOUS
  Filled 2019-12-19: qty 1

## 2019-12-19 MED ORDER — DOCUSATE SODIUM 100 MG PO CAPS
100.0000 mg | ORAL_CAPSULE | Freq: Two times a day (BID) | ORAL | Status: DC
  Administered 2019-12-19 – 2019-12-21 (×4): 100 mg via ORAL
  Filled 2019-12-19 (×4): qty 1

## 2019-12-19 MED ORDER — ONDANSETRON HCL 4 MG PO TABS: 4.0000 mg | ORAL_TABLET | Freq: Four times a day (QID) | ORAL | Status: DC | PRN

## 2019-12-19 MED ORDER — POLYETHYLENE GLYCOL 3350 17 G PO PACK: 17.0000 g | PACK | Freq: Every day | ORAL | Status: DC | PRN

## 2019-12-19 MED ORDER — NITROGLYCERIN 0.4 MG SL SUBL: 0.4000 mg | SUBLINGUAL_TABLET | SUBLINGUAL | Status: DC | PRN

## 2019-12-19 MED ORDER — ADULT MULTIVITAMIN W/MINERALS CH
1.0000 | ORAL_TABLET | Freq: Every day | ORAL | Status: DC
  Administered 2019-12-19 – 2019-12-21 (×2): 1 via ORAL
  Filled 2019-12-19 (×3): qty 1

## 2019-12-19 MED ORDER — ENSURE ENLIVE PO LIQD
237.0000 mL | Freq: Three times a day (TID) | ORAL | Status: DC
  Administered 2019-12-19 – 2019-12-21 (×3): 237 mL via ORAL

## 2019-12-19 MED ORDER — MORPHINE SULFATE (PF) 2 MG/ML IV SOLN
2.0000 mg | INTRAVENOUS | Status: DC | PRN
  Administered 2019-12-19 – 2019-12-20 (×2): 2 mg via INTRAVENOUS
  Filled 2019-12-19 (×2): qty 1

## 2019-12-19 MED ORDER — PANTOPRAZOLE SODIUM 40 MG PO TBEC
40.0000 mg | DELAYED_RELEASE_TABLET | Freq: Every day | ORAL | Status: DC
  Administered 2019-12-19 – 2019-12-21 (×3): 40 mg via ORAL
  Filled 2019-12-19 (×3): qty 1

## 2019-12-19 MED ORDER — ONDANSETRON HCL 4 MG/2ML IJ SOLN
4.0000 mg | Freq: Four times a day (QID) | INTRAMUSCULAR | Status: DC | PRN
  Administered 2019-12-20 (×2): 4 mg via INTRAVENOUS
  Filled 2019-12-19: qty 2

## 2019-12-19 MED ORDER — ACETAMINOPHEN 325 MG PO TABS: 650.0000 mg | ORAL_TABLET | Freq: Four times a day (QID) | ORAL | Status: DC | PRN
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
## 2019-12-19 NOTE — Consult Note (Signed)
Pharmacy Antibiotic Note  Joel Wilson. is a 79 y.o. male admitted on 12/19/2019 with sepsissecondary to wound infection .  Pharmacy has been consulted for cefepime and vancomycin  Dosing.  Patient received vancomycin 1g IV x 1 dose in ED.    Plan: 1. Will start Vancomycin 750 mg IV Q 24 hrs. Goal AUC 400-550. Expected AUC: 464 SCr used: 2.12 Css: 13.7  2. Start cefepime 2g IV every 12 hours    Height: 5\' 11"  (180.3 cm) Weight: 76.4 kg (168 lb 8 oz) IBW/kg (Calculated) : 75.3  Temp (24hrs), Avg:97.9 F (36.6 C), Min:97.9 F (36.6 C), Max:97.9 F (36.6 C)  Recent Labs  Lab 12/19/19 1104  WBC 25.0*  CREATININE 2.12*  LATICACIDVEN 2.5*    Estimated Creatinine Clearance: 30.1 mL/min (A) (by C-G formula based on SCr of 2.12 mg/dL (H)).    Allergies  Allergen Reactions  . Aspirin     Antimicrobials this admission: 4/17 cefepime  >>  4/17 vancomycin >>  4/17 flagyl>>   Microbiology results: 4/17 BCx: pending 4/17 UCx: pending   Thank you for allowing pharmacy to be a part of this patient's care.  [name], PharmD, BCPS Clinical Pharmacist 12/19/2019 1:11 PM

## 2019-12-19 NOTE — ED Notes (Signed)
Dr. Paduchowski informed of critical K 

## 2019-12-19 NOTE — ED Notes (Signed)
Per Charge Nurse on telemetry, pt needs to remain in ED until room availability is ascertained.

## 2019-12-19 NOTE — ED Notes (Signed)
This RN at bedside with Dr Hampton Abbot to assist him assessing the patient's sacral wounds, depends changed and pericare provided, pillow placed to float pt's heels, pillow placed behind pt's back to tilt pt off his sacrum, warm blanket given for comfort Sacral wound packed by dr Hampton Abbot and sacral decub bandage placed by dr Hampton Abbot

## 2019-12-19 NOTE — ED Triage Notes (Signed)
Pt arrives via ems from [facility], pt is c/o pain all over his body today, pt has multiple wounds to his legs, and a decubitus to his sacral area. Pt is moaning in pain, pt has a foley catheter in place that is draining yellow colored urine, recent dx of klebsiella pneumoniae

## 2019-12-19 NOTE — ED Notes (Signed)
Pt otf for imaging 

## 2019-12-19 NOTE — Progress Notes (Signed)
PHARMACY -  BRIEF ANTIBIOTIC NOTE   Pharmacy has received consult(s) for Vancomycin and Cefepime from an ED provider.  The patient's profile has been reviewed for ht/wt/allergies/indication/available labs.    One time order(s) placed for Vancomycin and Cefepime by ED provider  Further antibiotics/pharmacy consults should be ordered by admitting physician if indicated.                       Thank you, Vira Blanco 12/19/2019  11:43 AM
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
## 2019-12-19 NOTE — Anesthesia Preprocedure Evaluation (Addendum)
Anesthesia Evaluation  Patient identified by MRN, date of birth, ID band Patient awake and Patient confused    Reviewed: Allergy & Precautions, H&P , NPO status , Patient's Chart, lab work & pertinent test results  History of Anesthesia Complications Negative for: history of anesthetic complications  Airway Mallampati: III  TM Distance: >3 FB Neck ROM: limited    Dental  (+) Chipped, Poor Dentition, Missing   Pulmonary neg shortness of breath, pneumonia, former smoker,  Covid in January 2021          Cardiovascular Exercise Tolerance: Good hypertension, + CAD  (-) Past MI + dysrhythmias Atrial Fibrillation      Neuro/Psych  Neuromuscular disease negative psych ROS   GI/Hepatic negative GI ROS, Neg liver ROS,   Endo/Other  negative endocrine ROS  Renal/GU Renal disease (AKI)     Musculoskeletal   Abdominal   Peds  Hematology negative hematology ROS (+)   Anesthesia Other Findings Necrotic sacral decubitus ulcer  Past Medical History: No date: BPH without obstruction/lower urinary tract symptoms No date: Coronary artery disease     Comment:  a. 2003 PCI to Ramus;  b. 11/2012 Cath: LM nl, LAD 30,               LCX nl, RI 40 ISR, RCA nl. No date: Hereditary and idiopathic peripheral neuropathy No date: Hyperlipidemia No date: Hypertension No date: Hypertensive heart disease     Comment:  a. 08/2013 Echo: EF 70-75%, mild LVH. No date: Insomnia No date: Paroxysmal atrial fibrillation (HCC)     Comment:  a.08/2013-->amio;  b. CHA2DS2VASc = 4-->no longer on               coumadin. No date: Vitamin D deficiency  Past Surgical History: 11/13/12: CARDIAC CATHETERIZATION     Comment:  [facility]; EF >55% 2003: CARDIAC CATHETERIZATION No date: JOINT REPLACEMENT     Comment:  left hip No date: NECK SURGERY  BMI    Body Mass Index: 23.50 kg/m      Reproductive/Obstetrics negative OB ROS                             Anesthesia Physical Anesthesia Plan  ASA: III  Anesthesia Plan: General ETT   Post-op Pain Management:    Induction: Intravenous  PONV Risk Score and Plan: Ondansetron, Dexamethasone and Treatment may vary due to age or medical condition  Airway Management Planned: Oral ETT  Additional Equipment:   Intra-op Plan:   Post-operative Plan: Extubation in OR  Informed Consent: I have reviewed the patients History and Physical, chart, labs and discussed the procedure including the risks, benefits and alternatives for the proposed anesthesia with the patient or authorized representative who has indicated his/her understanding and acceptance.     Dental Advisory Given  Plan Discussed with: Anesthesiologist, CRNA and Surgeon  Anesthesia Plan Comments: (History and phone consent from the patients son German Manke at [phone]   Son consented for risks of anesthesia including but not limited to:  - adverse reactions to medications - damage to eyes, teeth, lips or other oral mucosa - nerve damage due to positioning  - sore throat or hoarseness - Damage to heart, brain, nerves, lungs or loss of life  Son voiced understanding.)      Anesthesia Quick Evaluation

## 2019-12-19 NOTE — Progress Notes (Signed)
CODE SEPSIS - PHARMACY COMMUNICATION  **Broad Spectrum Antibiotics should be administered within 1 hour of Sepsis diagnosis**  Time Code Sepsis Called/Page Received: 11:41  Antibiotics Ordered: Cefepime and Vancomycin  Time of 1st antibiotic administration: Cefepime given at 11:57  Additional action taken by pharmacy: n/a  If necessary, Name of Provider/Nurse Contacted: n/a    Vira Blanco ,PharmD Clinical Pharmacist  12/19/2019  12:06 PM

## 2019-12-19 NOTE — Progress Notes (Signed)
Sacral decubitus wound

## 2019-12-19 NOTE — ED Notes (Signed)
Pt checked for stool and found clean at this time. Condom cath still in place and draining appropriately.

## 2019-12-19 NOTE — H&P (Signed)
History and Physical    Joel Wilson. GUY:[id number] DOB: 1941/04/16 DOA: 12/19/2019  PCP: [facility]   Patient coming from: [place]  I have personally briefly reviewed patient's old medical records in [place]  Chief Complaint: Pain  HPI: Joel Wilson. is a 79 y.o. male with medical history significant for hypertension, paroxysmal A. fib, BPH with chronic indwelling Foley catheter who was brought into the emergency room by EMS for evaluation of worsening pain.  According to EMS they were called out to the patient's nursing facility because patient has been crying out in pain, he has chronic pain secondary to a known sacral decubitus ulcer as well as lower extremity pressure injuries but appears to be in more pain than normal today. In the ER patient was noted to be afebrile, blood pressure was stable.  Sacral wound was foul-smelling.  Labs revealed marked leukocytosis. Labs revealed marked leukocytosis and he has worsening renal function from his baseline, serum creatinine was 0.88 in January, 21 and now it is 2.1  ED Course: Patient presents to the emergency department for increased pain all over his body per patient.  Patient has several decubitus ulcers to the lower extremities and a large sacral decubitus ulcer.  The ulcerations do appear chronic although significant.    Patient's lab work has resulted quite abnormal with a white blood cell count of 25,000.  Given the severity of his sacral decubitus ulcerations with significant leukocytosis we will start the patient on broad-spectrum antibiotics and blood cultures and admit to the hospitalist service.  Patient also noted to be hypokalemic we will replete.  Patient also noted to be in renal insufficiency we will IV hydrate.  Review of Systems: As per HPI otherwise 10 point review of systems negative.    Past Medical History:  Diagnosis Date  . BPH without obstruction/lower urinary  tract symptoms   . Coronary artery disease    a. 2003 PCI to Ramus;  b. 11/2012 Cath: LM nl, LAD 30, LCX nl, RI 40 ISR, RCA nl.  . Hereditary and idiopathic peripheral neuropathy   . Hyperlipidemia   . Hypertension   . Hypertensive heart disease    a. 08/2013 Echo: EF 70-75%, mild LVH.  Marland Kitchen Insomnia   . Paroxysmal atrial fibrillation (HCC)    a.08/2013-->amio;  b. CHA2DS2VASc = 4-->no longer on coumadin.  . Vitamin D deficiency     Past Surgical History:  Procedure Laterality Date  . CARDIAC CATHETERIZATION  11/13/12   [facility]; EF >55%  . CARDIAC CATHETERIZATION  2003  . JOINT REPLACEMENT     left hip  . NECK SURGERY       reports that he has quit smoking. He has a 2.00 pack-year smoking history. He has never used smokeless tobacco. He reports that he does not drink alcohol or use drugs.  Allergies  Allergen Reactions  . Aspirin     Family History  Problem Relation Age of Onset  . Diabetes Neg Hx      Prior to Admission medications   Medication Sig Start Date End Date Taking? Authorizing Provider  acetaminophen (TYLENOL) 325 MG tablet Take 650 mg by mouth every 6 (six) hours as needed for mild pain or fever.     [provider]  albuterol (VENTOLIN HFA) 108 (90 Base) MCG/ACT inhaler Inhale 2 puffs into the lungs every 6 (six) hours as needed for wheezing or shortness of breath. 09/27/19   Allie Bossier, MD  alum & mag hydroxide-simeth (MINTOX) 604-540-98 MG/5ML suspension Take 30 mLs by mouth every 6 (six) hours as needed for indigestion or heartburn.    [provider]  amiodarone (PACERONE) 200 MG tablet Take 1 tablet (200 mg total) by mouth at bedtime. 09/30/15   Theora Gianotti, NP  bisacodyl (DULCOLAX) 5 MG EC tablet Take 1 tablet (5 mg total) by mouth daily. 09/28/19   Allie Bossier, MD  cefdinir (OMNICEF) 300 MG capsule Take 1 capsule (300 mg total) by mouth every 12 (twelve) hours. 09/27/19   Allie Bossier, MD  clopidogrel (PLAVIX) 75 MG  tablet Take 75 mg by mouth daily.    [provider]  collagenase (SANTYL) ointment Apply topically daily. 09/28/19   Allie Bossier, MD  diphenhydrAMINE (BENADRYL) 25 mg capsule Take 1 capsule (25 mg total) by mouth every 6 (six) hours as needed for itching or sleep. 09/27/19   Allie Bossier, MD  docusate sodium (COLACE) 100 MG capsule Take 1 capsule (100 mg total) by mouth 2 (two) times daily. 09/27/19   Allie Bossier, MD  doxycycline (VIBRA-TABS) 100 MG tablet Take 1 tablet (100 mg total) by mouth every 12 (twelve) hours. 09/27/19   Allie Bossier, MD  feeding supplement, ENSURE ENLIVE, (ENSURE ENLIVE) LIQD Take 237 mLs by mouth 3 (three) times daily between meals. 09/27/19   Allie Bossier, MD  finasteride (PROSCAR) 5 MG tablet Take 5 mg by mouth daily.     [provider]  gabapentin (NEURONTIN) 300 MG capsule Take 1 capsule (300 mg total) by mouth 3 (three) times daily. 09/27/19   Allie Bossier, MD  Lidocaine 4 % PTCH Place 1 patch onto the skin daily. (apply to right shoulder)    [provider]  Multiple Vitamin (MULTIVITAMIN WITH MINERALS) TABS tablet Take 1 tablet by mouth daily. 09/28/19   Allie Bossier, MD  nitroGLYCERIN (NITROSTAT) 0.4 MG SL tablet Place 0.4 mg under the tongue every 5 (five) minutes as needed for chest pain.    [provider]  ondansetron (ZOFRAN) 4 MG tablet Take 4 mg by mouth every 6 (six) hours as needed for nausea or vomiting.     [provider]  Oxycodone HCl 20 MG TABS Take 0.5 tablets (10 mg total) by mouth 3 (three) times daily. 08/15/19   Dhungel, Nishant, MD  pantoprazole (PROTONIX) 40 MG tablet Take 1 tablet (40 mg total) by mouth daily. 09/08/19   Sidney Ace, MD  polyethylene glycol (MIRALAX / GLYCOLAX) 17 g packet Take 17 g by mouth daily as needed for mild constipation.    [provider]  simvastatin (ZOCOR) 10 MG tablet Take 10 mg by mouth at bedtime.    [provider]    tamsulosin (FLOMAX) 0.4 MG CAPS Take 0.4 mg by mouth daily.     [provider]    Physical Exam: Vitals:   12/19/19 1059 12/19/19 1100 12/19/19 1101  BP: 127/72    Pulse: 99    Resp: 18    Temp: 97.9 F (36.6 C)    TempSrc: Oral    SpO2: 96%    Weight:  76 kg 76.4 kg  Height:  5\' 11"  (1.803 m) 5\' 11"  (1.803 m)     Vitals:   12/19/19 1059 12/19/19 1100 12/19/19 1101  BP: 127/72    Pulse: 99    Resp: 18    Temp: 97.9 F (36.6 C)    TempSrc: Oral  SpO2: 96%    Weight:  76 kg 76.4 kg  Height:  5\' 11"  (1.803 m) 5\' 11"  (1.803 m)    Constitutional: NAD, alert and oriented x 3.  Chronically ill-appearing Eyes: PERRL, lids and conjunctivae normal ENMT: Mucous membranes are dry Neck: normal, supple, no masses, no thyromegaly Respiratory: clear to auscultation bilaterally, no wheezing, no crackles. Normal respiratory effort. No accessory muscle use.  Cardiovascular: Regular rate and rhythm, no murmurs / rubs / gallops. Left lower extremity edema. 2+ pedal pulses. No carotid bruits.  Abdomen: no tenderness, no masses palpated. No hepatosplenomegaly. Bowel sounds positive.  Chronic indwelling Foley catheter in place Musculoskeletal: no clubbing / cyanosis. No joint deformity upper and lower extremities.  Skin: no rashes, lesions, Sacral decubitus ulcer with foul odor, pressure injury to right leg and right heel. Neurologic: No gross focal neurologic deficit. Psychiatric: Normal mood and affect.   Labs on Admission: I have personally reviewed following labs and imaging studies  CBC: Recent Labs  Lab 12/19/19 1104  WBC 25.0*  NEUTROABS 20.6*  HGB 11.3*  HCT 34.7*  MCV 91.6  PLT 888*   Basic Metabolic Panel: Recent Labs  Lab 12/19/19 1104  NA 138  K 2.6*  CL 95*  CO2 28  GLUCOSE 129*  BUN 52*  CREATININE 2.12*  CALCIUM 8.3*   GFR: Estimated Creatinine Clearance: 30.1 mL/min (A) (by C-G formula based on SCr of 2.12 mg/dL (H)). Liver Function  Tests: Recent Labs  Lab 12/19/19 1104  AST 31  ALT 24  ALKPHOS 58  BILITOT 1.2  PROT 7.0  ALBUMIN 2.1*   No results for input(s): LIPASE, AMYLASE in the last 168 hours. No results for input(s): AMMONIA in the last 168 hours. Coagulation Profile: No results for input(s): INR, PROTIME in the last 168 hours. Cardiac Enzymes: No results for input(s): CKTOTAL, CKMB, CKMBINDEX, TROPONINI in the last 168 hours. BNP (last 3 results) No results for input(s): PROBNP in the last 8760 hours. HbA1C: No results for input(s): HGBA1C in the last 72 hours. CBG: No results for input(s): GLUCAP in the last 168 hours. Lipid Profile: No results for input(s): CHOL, HDL, LDLCALC, TRIG, CHOLHDL, LDLDIRECT in the last 72 hours. Thyroid Function Tests: No results for input(s): TSH, T4TOTAL, FREET4, T3FREE, THYROIDAB in the last 72 hours. Anemia Panel: No results for input(s): VITAMINB12, FOLATE, FERRITIN, TIBC, IRON, RETICCTPCT in the last 72 hours. Urine analysis:    Component Value Date/Time   COLORURINE YELLOW (A) 09/22/2019 1542   APPEARANCEUR CLOUDY (A) 09/22/2019 1542   APPEARANCEUR Clear 08/29/2013 0454   LABSPEC 1.013 09/22/2019 1542   LABSPEC 1.019 08/29/2013 0454   PHURINE 5.0 09/22/2019 1542   GLUCOSEU NEGATIVE 09/22/2019 1542   GLUCOSEU Negative 08/29/2013 0454   HGBUR MODERATE (A) 09/22/2019 1542   BILIRUBINUR NEGATIVE 09/22/2019 1542   BILIRUBINUR Negative 08/29/2013 0454   KETONESUR NEGATIVE 09/22/2019 1542   PROTEINUR NEGATIVE 09/22/2019 1542   NITRITE NEGATIVE 09/22/2019 1542   LEUKOCYTESUR MODERATE (A) 09/22/2019 1542   LEUKOCYTESUR Negative 08/29/2013 0454    Radiological Exams on Admission: No results found.  EKG: Independently reviewed.  Sinus rhythm with PVCs  Assessment/Plan Principal Problem:   Wound infection Active Problems:   Atrial fibrillation (HCC)   AKI (acute kidney injury) (HCC)   Hypokalemia   Chronic indwelling Foley catheter   BPH (benign  prostatic hyperplasia)    Wound infection Patient with a sacral decubitus ulcer as well as pressure injuries involving his lower extremities who presents from  the nursing home for worsening pain in his back Sacral wound appears infected and has a foul order to it Lactate is elevated at 2.51 patient has marked leukocytosis of 25,000 but with stable vital signs We will request surgical consult Place patient on empiric antibiotic therapy with vancomycin and cefepime adjusted to renal function Wound care consult Pain control   Acute kidney injury Most likely secondary to poor oral intake Patient noted to have elevated BUN/creatinine 16/0.8 >> 52/2.12 We will hydrate patient and repeat renal parameters in a.m.   BPH with chronic indwelling Foley catheter Continue Flomax   Atrial fibrillation  Continue amiodarone for rate control   Hypokalemia Most likely related to poor oral intake Supplement potassium Check magnesium level   DVT prophylaxis: Heparin Code Status: Full code Family Communication: Plan of care was discussed with patient's son Khameron Gruenwald over the phone.  All questions and concerns were addressed.  CODE STATUS was discussed and patient is a full code Disposition Plan: Back to previous home environment Consults called: Surgery    Goerge Mohr MD [facility]     12/19/2019, 12:05 PM

## 2019-12-20 ENCOUNTER — Inpatient Hospital Stay: Payer: Medicare Other | Admitting: Anesthesiology

## 2019-12-20 ENCOUNTER — Encounter: Payer: Self-pay | Admitting: Internal Medicine

## 2019-12-20 ENCOUNTER — Encounter
Admission: EM | Disposition: A | Discharge status: Hospice | Payer: Self-pay | Source: Skilled Nursing Facility | Attending: Internal Medicine

## 2019-12-20 ENCOUNTER — Other Ambulatory Visit: Payer: Self-pay

## 2019-12-20 DIAGNOSIS — L89154 Pressure ulcer of sacral region, stage 4: Secondary | ICD-10-CM | POA: Diagnosis not present

## 2019-12-20 HISTORY — PX: WOUND DEBRIDEMENT: SHX247

## 2019-12-20 LAB — BASIC METABOLIC PANEL
Anion gap: 9 (ref 5–15)
BUN: 45 mg/dL — ABNORMAL HIGH (ref 8–23)
CO2: 26 mmol/L (ref 22–32)
Calcium: 8.1 mg/dL — ABNORMAL LOW (ref 8.9–10.3)
Chloride: 106 mmol/L (ref 98–111)
Creatinine, Ser: 1.51 mg/dL — ABNORMAL HIGH (ref 0.61–1.24)
GFR calc Af Amer: 50 mL/min — ABNORMAL LOW (ref >60)
GFR calc non Af Amer: 43 mL/min — ABNORMAL LOW (ref >60)
Glucose, Bld: 101 mg/dL — ABNORMAL HIGH (ref 70–99)
Potassium: 4.4 mmol/L (ref 3.5–5.1)
Sodium: 141 mmol/L (ref 135–145)

## 2019-12-20 LAB — SURGICAL PCR SCREEN
MRSA, PCR: POSITIVE — AB
Staphylococcus aureus: POSITIVE — AB

## 2019-12-20 LAB — CBC
HCT: 31.8 % — ABNORMAL LOW (ref 39.0–52.0)
Hemoglobin: 10.3 g/dL — ABNORMAL LOW (ref 13.0–17.0)
MCH: 29.9 pg (ref 26.0–34.0)
MCHC: 32.4 g/dL (ref 30.0–36.0)
MCV: 92.2 fL (ref 80.0–100.0)
Platelets: 367 10*3/uL (ref 150–400)
RBC: 3.45 MIL/uL — ABNORMAL LOW (ref 4.22–5.81)
RDW: 15.3 % (ref 11.5–15.5)
WBC: 16.1 10*3/uL — ABNORMAL HIGH (ref 4.0–10.5)
nRBC: 0 % (ref 0.0–0.2)

## 2019-12-20 LAB — SEDIMENTATION RATE: Sed Rate: 107 mm/hr — ABNORMAL HIGH (ref 0–20)

## 2019-12-20 SURGERY — DEBRIDEMENT, WOUND
Anesthesia: General

## 2019-12-20 MED ORDER — LACTATED RINGERS IV SOLN: INTRAVENOUS | Status: DC | PRN

## 2019-12-20 MED ORDER — VANCOMYCIN HCL IN DEXTROSE 1-5 GM/200ML-% IV SOLN
1000.0000 mg | INTRAVENOUS | Status: DC
  Administered 2019-12-21: 1000 mg via INTRAVENOUS
  Filled 2019-12-20 (×2): qty 200

## 2019-12-20 MED ORDER — METRONIDAZOLE IN NACL 5-0.79 MG/ML-% IV SOLN
500.0000 mg | Freq: Three times a day (TID) | INTRAVENOUS | Status: DC
  Administered 2019-12-20 – 2019-12-21 (×4): 500 mg via INTRAVENOUS
  Filled 2019-12-20 (×6): qty 100

## 2019-12-20 MED ORDER — MORPHINE SULFATE (PF) 2 MG/ML IV SOLN
2.0000 mg | Freq: Once | INTRAVENOUS | Status: AC
  Administered 2019-12-20: 2 mg via INTRAVENOUS
  Filled 2019-12-20: qty 1

## 2019-12-20 MED ORDER — SODIUM CHLORIDE 0.9 % IV SOLN
INTRAVENOUS | Status: DC | PRN
  Administered 2019-12-20 – 2019-12-21 (×2): 250 mL via INTRAVENOUS

## 2019-12-20 MED ORDER — FENTANYL CITRATE (PF) 100 MCG/2ML IJ SOLN
INTRAMUSCULAR | Status: DC | PRN
  Administered 2019-12-20 (×2): 50 ug via INTRAVENOUS

## 2019-12-20 MED ORDER — POTASSIUM CHLORIDE IN NACL 20-0.9 MEQ/L-% IV SOLN
INTRAVENOUS | Status: DC
  Filled 2019-12-20 (×3): qty 1000

## 2019-12-20 MED ORDER — FENTANYL CITRATE (PF) 100 MCG/2ML IJ SOLN
INTRAMUSCULAR | Status: AC
  Filled 2019-12-20: qty 2

## 2019-12-20 MED ORDER — CLOPIDOGREL BISULFATE 75 MG PO TABS
75.0000 mg | ORAL_TABLET | Freq: Every day | ORAL | Status: DC
  Administered 2019-12-21: 75 mg via ORAL
  Filled 2019-12-20: qty 1

## 2019-12-20 MED ORDER — AMLODIPINE BESYLATE 5 MG PO TABS
5.0000 mg | ORAL_TABLET | Freq: Every day | ORAL | Status: DC
  Administered 2019-12-20 – 2019-12-21 (×2): 5 mg via ORAL
  Filled 2019-12-20 (×2): qty 1

## 2019-12-20 MED ORDER — DEXAMETHASONE SODIUM PHOSPHATE 10 MG/ML IJ SOLN
INTRAMUSCULAR | Status: DC | PRN
  Administered 2019-12-20: 5 mg via INTRAVENOUS

## 2019-12-20 MED ORDER — VASOPRESSIN 20 UNIT/ML IV SOLN
INTRAVENOUS | Status: DC | PRN
  Administered 2019-12-20 (×4): 2 [IU] via INTRAVENOUS

## 2019-12-20 MED ORDER — OXYCODONE HCL 5 MG/5ML PO SOLN: 5.0000 mg | Freq: Once | ORAL | Status: DC | PRN

## 2019-12-20 MED ORDER — LIDOCAINE HCL (CARDIAC) PF 100 MG/5ML IV SOSY
PREFILLED_SYRINGE | INTRAVENOUS | Status: DC | PRN
  Administered 2019-12-20: 100 mg via INTRAVENOUS

## 2019-12-20 MED ORDER — NEOSTIGMINE METHYLSULFATE 10 MG/10ML IV SOLN: INTRAVENOUS | Status: DC | PRN

## 2019-12-20 MED ORDER — FENTANYL CITRATE (PF) 100 MCG/2ML IJ SOLN: 25.0000 ug | INTRAMUSCULAR | Status: DC | PRN

## 2019-12-20 MED ORDER — MUPIROCIN 2 % EX OINT
1.0000 "application " | TOPICAL_OINTMENT | Freq: Two times a day (BID) | CUTANEOUS | Status: DC
  Administered 2019-12-20 – 2019-12-21 (×4): 1 via NASAL
  Filled 2019-12-20: qty 22

## 2019-12-20 MED ORDER — PROPOFOL 10 MG/ML IV BOLUS
INTRAVENOUS | Status: DC | PRN
  Administered 2019-12-20: 100 mg via INTRAVENOUS

## 2019-12-20 MED ORDER — PHENYLEPHRINE HCL (PRESSORS) 10 MG/ML IV SOLN
INTRAVENOUS | Status: DC | PRN
  Administered 2019-12-20 (×3): 200 ug via INTRAVENOUS
  Administered 2019-12-20: 300 ug via INTRAVENOUS
  Administered 2019-12-20: 100 ug via INTRAVENOUS
  Administered 2019-12-20: 200 ug via INTRAVENOUS

## 2019-12-20 MED ORDER — OXYCODONE HCL 5 MG PO TABS: 5.0000 mg | ORAL_TABLET | ORAL | Status: DC | PRN

## 2019-12-20 MED ORDER — HEPARIN SODIUM (PORCINE) 5000 UNIT/ML IJ SOLN
5000.0000 [IU] | Freq: Three times a day (TID) | INTRAMUSCULAR | Status: DC
  Administered 2019-12-21 – 2019-12-22 (×4): 5000 [IU] via SUBCUTANEOUS
  Filled 2019-12-20 (×4): qty 1

## 2019-12-20 MED ORDER — SUCCINYLCHOLINE CHLORIDE 20 MG/ML IJ SOLN
INTRAMUSCULAR | Status: DC | PRN
  Administered 2019-12-20: 100 mg via INTRAVENOUS

## 2019-12-20 MED ORDER — OXYCODONE HCL 5 MG PO TABS: 5.0000 mg | ORAL_TABLET | Freq: Once | ORAL | Status: DC | PRN

## 2019-12-20 MED ORDER — HYDROMORPHONE HCL 1 MG/ML IJ SOLN
0.5000 mg | INTRAMUSCULAR | Status: DC | PRN
  Administered 2019-12-21: 0.5 mg via INTRAVENOUS
  Filled 2019-12-20: qty 0.5

## 2019-12-20 SURGICAL SUPPLY — 29 items
BLADE SURG 15 STRL LF DISP TIS (BLADE) ×1 IMPLANT
BLADE SURG 15 STRL SS (BLADE) ×3
BRIEF STRETCH MATERNITY 2XLG (MISCELLANEOUS) ×3 IMPLANT
CANISTER SUCT 1200ML W/VALVE (MISCELLANEOUS) ×3 IMPLANT
COVER WAND RF STERILE (DRAPES) ×3 IMPLANT
DECANTER SPIKE VIAL GLASS SM (MISCELLANEOUS) ×3 IMPLANT
DRAIN PENROSE 1/4X12 LTX STRL (WOUND CARE) IMPLANT
DRAPE LAPAROTOMY 100X77 ABD (DRAPES) ×3 IMPLANT
DRSG GAUZE FLUFF 36X18 (GAUZE/BANDAGES/DRESSINGS) ×3 IMPLANT
GLOVE SURG SYN 7.0 (GLOVE) ×3 IMPLANT
GLOVE SURG SYN 7.5  E (GLOVE) ×3
GLOVE SURG SYN 7.5 E (GLOVE) ×1 IMPLANT
GOWN STRL REUS W/ TWL LRG LVL3 (GOWN DISPOSABLE) ×2 IMPLANT
GOWN STRL REUS W/TWL LRG LVL3 (GOWN DISPOSABLE) ×6
IV CATH ANGIO 14GX1.88 NO SAFE (IV SOLUTION) ×3 IMPLANT
KIT TURNOVER KIT A (KITS) ×3 IMPLANT
LABEL OR SOLS (LABEL) ×3 IMPLANT
NEEDLE HYPO 22GX1.5 SAFETY (NEEDLE) ×3 IMPLANT
NS IRRIG 500ML POUR BTL (IV SOLUTION) ×3 IMPLANT
PACK BASIN MINOR (MISCELLANEOUS) ×3 IMPLANT
PAD ABD DERMACEA PRESS 5X9 (GAUZE/BANDAGES/DRESSINGS) ×3 IMPLANT
SOL PREP PVP 2OZ (MISCELLANEOUS) ×3
SOLUTION PREP PVP 2OZ (MISCELLANEOUS) ×1 IMPLANT
SURGILUBE 2OZ TUBE FLIPTOP (MISCELLANEOUS) ×3 IMPLANT
SUT ETH BLK MONO 3 0 FS 1 12/B (SUTURE) IMPLANT
SWAB CULTURE AMIES ANAERIB BLU (MISCELLANEOUS) ×3 IMPLANT
SYR 10ML LL (SYRINGE) ×3 IMPLANT
SYR 20ML LL LF (SYRINGE) ×3 IMPLANT
SYR BULB IRRIG 60ML STRL (SYRINGE) ×3 IMPLANT

## 2019-12-20 NOTE — Progress Notes (Signed)
12/20/2019  Subjective: Patient admitted yesterday with infected sacral decubitus wound.  Has been confused and disoriented at times.  Has been reporting diffuse body pain.  Currently on Vancomycin and Cefepime.  His WBC improved to 16.1 this morning, renal function also improved to 1.51.  Vital signs: Temp:  [97.7 F (36.5 C)-98.5 F (36.9 C)] 97.9 F (36.6 C) (04/18 0728) Pulse Rate:  [89-103] 94 (04/18 0728) Resp:  [13-25] 18 (04/18 0728) BP: (89-140)/(56-87) 97/70 (04/18 0728) SpO2:  [92 %-100 %] 100 % (04/18 0728) Weight:  [74.1 kg-76.4 kg] 74.1 kg (04/18 0247)   Intake/Output: 04/17 0701 - 04/18 0700 In: 1337.1 [I.V.:1237.2; IV Piggyback:99.9] Out: -     Physical Exam: Constitutional: No acute distress Skin:  Patient has 8 x 8 cm sacral decubitus wound with necrotic edges within the wound, with bone exposed and palpable.  Labs:  Recent Labs    12/19/19 1104 12/20/19 0439  WBC 25.0* 16.1*  HGB 11.3* 10.3*  HCT 34.7* 31.8*  PLT 477* 367   Recent Labs    12/19/19 1104 12/20/19 0439  NA 138 141  K 2.6* 4.4  CL 95* 106  CO2 28 26  GLUCOSE 129* 101*  BUN 52* 45*  CREATININE 2.12* 1.51*  CALCIUM 8.3* 8.1*   Recent Labs    12/19/19 1104  LABPROT 15.4*  INR 1.2    Imaging: DG Chest 1 View  Result Date: 12/19/2019 CLINICAL DATA:  Sepsis. EXAM: CHEST  1 VIEW COMPARISON:  September 22, 2019. FINDINGS: The heart size and mediastinal contours are within normal limits. Both lungs are clear. The visualized skeletal structures are unremarkable. IMPRESSION: No active disease. Aortic Atherosclerosis (ICD10-I70.0). Electronically Signed   By: Marijo Conception M.D.   On: 12/19/2019 12:39   US Venous Img Lower Unilateral Left (DVT)  Result Date: 12/19/2019 CLINICAL DATA:  LEFT lower extremity edema for several weeks. EXAM: LEFT LOWER EXTREMITY VENOUS DOPPLER ULTRASOUND TECHNIQUE: Gray-scale sonography with compression, as well as color and duplex ultrasound, were performed  to evaluate the deep venous system(s) from the level of the common femoral vein through the popliteal and proximal calf veins. COMPARISON:  None. FINDINGS: VENOUS Normal compressibility of the common femoral, superficial femoral, and popliteal veins, as well as the visualized calf veins. Visualized portions of profunda femoral vein and great saphenous vein unremarkable. No filling defects to suggest DVT on grayscale or color Doppler imaging. Doppler waveforms show normal direction of venous flow, normal respiratory phasicity and response to augmentation. Limited views of the contralateral common femoral vein are unremarkable. OTHER None. Limitations: none IMPRESSION: No femoropopliteal DVT nor evidence of DVT within the visualized calf veins. If clinical symptoms are inconsistent or if there are persistent or worsening symptoms, further imaging (possibly involving the iliac veins) may be warranted. Electronically Signed   By: Franki Cabot M.D.   On: 12/19/2019 14:10    Assessment/Plan: This is a 79 y.o. male with infected sacral decubitus wound.  --Will take to OR this morning for debridement of the wound, possible wound vac placement.  Had discussed case with his son Stormy Card and daughter Levada Dy yesterday. --Will resume diet afterwards.  Continue pain medication, IV antibiotics.   Joel Wilson, [facility]

## 2019-12-20 NOTE — Consult Note (Signed)
Pharmacy Antibiotic Note  Joel Wilson. is a 79 y.o. male admitted on 12/19/2019 with sepsissecondary to wound infection .  Pharmacy has been consulted for cefepime and vancomycin  Dosing.  Patient received vancomycin 1g IV x 1 dose in ED.    Scr: 2.12 >> 1.51   Plan: 1. Scr improved. Will adjust dose to  Vancomycin 1000 mg IV Q 24 hrs. Goal AUC 400-550. Expected AUC: 481.7 SCr used: 1.51 Css: 12.6   2. Continue cefepime 2g IV every 12 hours    Height: 5\' 11"  (180.3 cm) Weight: 74.1 kg (163 lb 5.8 oz) IBW/kg (Calculated) : 75.3  Temp (24hrs), Avg:98 F (36.7 C), Min:97.6 F (36.4 C), Max:98.5 F (36.9 C)  Recent Labs  Lab 12/19/19 1104 12/19/19 1359 12/20/19 0439  WBC 25.0*  --  16.1*  CREATININE 2.12*  --  1.51*  LATICACIDVEN 2.5* 2.2*  --     Estimated Creatinine Clearance: 41.6 mL/min (A) (by C-G formula based on SCr of 1.51 mg/dL (H)).    Allergies  Allergen Reactions  . Aspirin     Antimicrobials this admission: 4/17 cefepime  >>  4/17 vancomycin >>  4/17 flagyl>>   Microbiology results: 4/17 BCx: pending 4/17 UCx: pending  4/18 Wound Cx: pending   Thank you for allowing pharmacy to be a part of this patient's care.  [name], PharmD, BCPS Clinical Pharmacist 12/20/2019 1:26 PM

## 2019-12-20 NOTE — OR Nursing (Signed)
Skin tears on left hand and wrist. Covered with telfa/tegaderm and honeycomb respectively

## 2019-12-20 NOTE — Progress Notes (Signed)
Patient ID: Joel Curto., male   DOB: Jul 01, 1941, 79 y.o.   MRN: [id number] [facility] PROGRESS NOTE  Joel Wilson. BTD:[id number] DOB: [birth year] DOA: 12/19/2019 PCP: [facility]  HPI/Subjective: Patient seen prior to the operation and was in a lot of pain all over his body.  He was asking for some pain medications.  Objective: Vitals:   12/20/19 1226 12/20/19 1246  BP: (!) 163/93 (!) 172/138  Pulse: 95 98  Resp: 20 (!) 21  Temp: 97.6 F (36.4 C) 98.3 F (36.8 C)  SpO2: 97% 98%    Intake/Output Summary (Last 24 hours) at 12/20/2019 1442 Last data filed at 12/20/2019 1141 Gross per 24 hour  Intake 2337.07 ml  Output 5 ml  Net 2332.07 ml   Filed Weights   12/19/19 1100 12/19/19 1101 12/20/19 0247  Weight: 76 kg 76.4 kg 74.1 kg    ROS: Review of Systems  Constitutional: Negative for fever.  Eyes: Negative for blurred vision.  Respiratory: Negative for shortness of breath.   Cardiovascular: Negative for chest pain.  Gastrointestinal: Negative for abdominal pain, nausea and vomiting.  Genitourinary: Negative for dysuria.  Musculoskeletal: Positive for back pain and joint pain.  Neurological: Negative for dizziness.   Exam: Physical Exam  HENT:  Nose: No mucosal edema.  Mouth/Throat: No oropharyngeal exudate or posterior oropharyngeal edema.  Eyes: Conjunctivae and lids are normal.  Neck: Carotid bruit is not present.  Cardiovascular: S1 normal and S2 normal. Exam reveals no gallop.  No murmur heard. Respiratory: No respiratory distress. He has decreased breath sounds in the right lower field and the left lower field. He has no wheezes. He has no rhonchi. He has no rales.  GI: Soft. Bowel sounds are normal. There is no abdominal tenderness.  Musculoskeletal:     Right ankle: Swelling present.     Left ankle: Swelling present.  Lymphadenopathy:    He has no cervical adenopathy.  Neurological: He is alert.  Skin: Skin is warm. Nails  show no clubbing.  As per surgeon stage IV decubiti buttock Bilateral heels covered with dressing  Psychiatric: He has a normal mood and affect.      Data Reviewed: Basic Metabolic Panel: Recent Labs  Lab 12/19/19 1104 12/20/19 0439  NA 138 141  K 2.6* 4.4  CL 95* 106  CO2 28 26  GLUCOSE 129* 101*  BUN 52* 45*  CREATININE 2.12* 1.51*  CALCIUM 8.3* 8.1*  MG 1.8  --    Liver Function Tests: Recent Labs  Lab 12/19/19 1104  AST 31  ALT 24  ALKPHOS 58  BILITOT 1.2  PROT 7.0  ALBUMIN 2.1*   CBC: Recent Labs  Lab 12/19/19 1104 12/20/19 0439  WBC 25.0* 16.1*  NEUTROABS 20.6*  --   HGB 11.3* 10.3*  HCT 34.7* 31.8*  MCV 91.6 92.2  PLT 477* 367   BNP (last 3 results) Recent Labs    09/25/19 0027 09/26/19 0142 09/27/19 0326  BNP 174.6* 96.9 198.8*     Recent Results (from the past 240 hour(s))  Blood Culture (routine x 2)     Status: None (Preliminary result)   Collection Time: 12/19/19 11:05 AM   Specimen: BLOOD  Result Value Ref Range Status   Specimen Description BLOOD RAC  Final   Special Requests   Final    BOTTLES DRAWN AEROBIC AND ANAEROBIC Blood Culture adequate volume   Culture   Final    NO GROWTH < 24 HOURS Performed at  [facility], [address] [place], [place] [address]    Report Status PENDING  Incomplete  Blood Culture (routine x 2)     Status: None (Preliminary result)   Collection Time: 12/19/19 11:52 AM   Specimen: BLOOD  Result Value Ref Range Status   Specimen Description BLOOD RIGHT ANTECUBITAL  Final   Special Requests   Final    BOTTLES DRAWN AEROBIC AND ANAEROBIC Blood Culture adequate volume   Culture   Final    NO GROWTH < 24 HOURS Performed at [facility], [address], [place], [place] [address]    Report Status PENDING  Incomplete  Respiratory Panel by RT PCR (Flu A&B, Covid) - Nasopharyngeal Swab     Status: None   Collection Time: 12/19/19 12:27 PM   Specimen: Nasopharyngeal Swab  Result  Value Ref Range Status   SARS Coronavirus 2 by RT PCR NEGATIVE NEGATIVE Final    Comment: (NOTE) SARS-CoV-2 target nucleic acids are NOT DETECTED. The SARS-CoV-2 RNA is generally detectable in upper respiratoy specimens during the acute phase of infection. The lowest concentration of SARS-CoV-2 viral copies this assay can detect is 131 copies/mL. A negative result does not preclude SARS-Cov-2 infection and should not be used as the sole basis for treatment or other patient management decisions. A negative result may occur with  improper specimen collection/handling, submission of specimen other than nasopharyngeal swab, presence of viral mutation(s) within the areas targeted by this assay, and inadequate number of viral copies (<131 copies/mL). A negative result must be combined with clinical observations, patient history, and epidemiological information. The expected result is Negative. Fact Sheet for Patients:  PinkCheek.be Fact Sheet for Healthcare Providers:  GravelBags.it This test is not yet ap proved or cleared by the [place] FDA and  has been authorized for detection and/or diagnosis of SARS-CoV-2 by FDA under an Emergency Use Authorization (EUA). This EUA will remain  in effect (meaning this test can be used) for the duration of the COVID-19 declaration under Section 564(b)(1) of the Act, 21 U.S.C. section 360bbb-3(b)(1), unless the authorization is terminated or revoked sooner.    Influenza A by PCR NEGATIVE NEGATIVE Final   Influenza B by PCR NEGATIVE NEGATIVE Final    Comment: (NOTE) The Xpert Xpress SARS-CoV-2/FLU/RSV assay is intended as an aid in  the diagnosis of influenza from Nasopharyngeal swab specimens and  should not be used as a sole basis for treatment. Nasal washings and  aspirates are unacceptable for Xpert Xpress SARS-CoV-2/FLU/RSV  testing. Fact Sheet for  Patients: PinkCheek.be Fact Sheet for Healthcare Providers: GravelBags.it This test is not yet approved or cleared by the [place] FDA and  has been authorized for detection and/or diagnosis of SARS-CoV-2 by  FDA under an Emergency Use Authorization (EUA). This EUA will remain  in effect (meaning this test can be used) for the duration of the  Covid-19 declaration under Section 564(b)(1) of the Act, 21  U.S.C. section 360bbb-3(b)(1), unless the authorization is  terminated or revoked. Performed at [facility], [address], [place],  [address]   Surgical PCR screen     Status: Abnormal   Collection Time: 12/20/19  6:51 AM   Specimen: Nasal Mucosa; Nasal Swab  Result Value Ref Range Status   MRSA, PCR POSITIVE (A) NEGATIVE Final    Comment: RESULT CALLED TO, READ BACK BY AND VERIFIED WITH: JODIE SMITH AT 4854 12/20/19.PMF    Staphylococcus aureus POSITIVE (A) NEGATIVE Final  Comment: (NOTE) The Xpert SA Assay (FDA approved for NASAL specimens in patients 98 years of age and older), is one component of a comprehensive surveillance program. It is not intended to diagnose infection nor to guide or monitor treatment. Performed at [facility], [address], [place], [place] [address]      Studies: DG Chest 1 View  Result Date: 12/19/2019 CLINICAL DATA:  Sepsis. EXAM: CHEST  1 VIEW COMPARISON:  September 22, 2019. FINDINGS: The heart size and mediastinal contours are within normal limits. Both lungs are clear. The visualized skeletal structures are unremarkable. IMPRESSION: No active disease. Aortic Atherosclerosis (ICD10-I70.0). Electronically Signed   By: Marijo Conception M.D.   On: 12/19/2019 12:39   US Venous Img Lower Unilateral Left (DVT)  Result Date: 12/19/2019 CLINICAL DATA:  LEFT lower extremity edema for several weeks. EXAM: LEFT LOWER EXTREMITY VENOUS DOPPLER ULTRASOUND TECHNIQUE:  Gray-scale sonography with compression, as well as color and duplex ultrasound, were performed to evaluate the deep venous system(s) from the level of the common femoral vein through the popliteal and proximal calf veins. COMPARISON:  None. FINDINGS: VENOUS Normal compressibility of the common femoral, superficial femoral, and popliteal veins, as well as the visualized calf veins. Visualized portions of profunda femoral vein and great saphenous vein unremarkable. No filling defects to suggest DVT on grayscale or color Doppler imaging. Doppler waveforms show normal direction of venous flow, normal respiratory phasicity and response to augmentation. Limited views of the contralateral common femoral vein are unremarkable. OTHER None. Limitations: none IMPRESSION: No femoropopliteal DVT nor evidence of DVT within the visualized calf veins. If clinical symptoms are inconsistent or if there are persistent or worsening symptoms, further imaging (possibly involving the iliac veins) may be warranted. Electronically Signed   By: Franki Cabot M.D.   On: 12/19/2019 14:10    Scheduled Meds: . amiodarone  200 mg Oral QHS  . amLODipine  5 mg Oral Daily  . bisacodyl  5 mg Oral Daily  . [START ON 12/21/2019] clopidogrel  75 mg Oral Daily  . docusate sodium  100 mg Oral BID  . feeding supplement (ENSURE ENLIVE)  237 mL Oral TID BM  . finasteride  5 mg Oral Daily  . gabapentin  300 mg Oral TID  . [START ON 12/21/2019] heparin  5,000 Units Subcutaneous Q8H  . lidocaine  1 patch Transdermal Daily  . multivitamin with minerals  1 tablet Oral Daily  . mupirocin ointment  1 application Nasal BID  . oxyCODONE  10 mg Oral TID  . pantoprazole  40 mg Oral Daily  . simvastatin  10 mg Oral QHS  . tamsulosin  0.4 mg Oral Daily   Continuous Infusions: . 0.9 % NaCl with KCl 20 mEq / L    . ceFEPime (MAXIPIME) IV 2 g (12/20/19 1354)  . metronidazole    . [START ON 12/21/2019] vancomycin      Assessment/Plan:  1. Infected  decubitus ulcer stage IV.  Patient taken to the operating room for debridement today by Dr. Hampton Abbot.  Please see operative report.  On vancomycin and Maxipime we will add Flagyl until cultures are back.  Bone biopsy done to see if there is osteomyelitis.  Will likely get infectious disease consultation tomorrow.  Pain control with morphine and oxycodone. 2. Acute kidney injury.  Creatinine improved from 2.12 down to 1.51. 3. Hypokalemia improved from 2.6 up to 4.4.  Decreased level of potassium in the IV fluids. 4. Essential hypertension start Norvasc  control pain. 5. Paroxysmal atrial fibrillation on amiodarone 6. Bilateral heel wrapped.  I will take a better look at them tomorrow to stage. 7. BPH on Flomax and Proscar 8. GERD on Protonix 9. Hyperlipidemia unspecified on Zocor  Code Status:     Code Status Orders  (From admission, onward)         Start     Ordered   12/19/19 1213  Full code  Continuous     12/19/19 1217        Code Status History    Date Active Date Inactive Code Status Order ID Comments User Context   09/23/2019 1228 09/27/2019 1828 Full Code [id number]  Thurnell Lose, MD Inpatient   09/22/2019 1738 09/23/2019 1114 Full Code [id number]  Wyvonnia Dusky, MD ED   09/04/2019 0454 09/09/2019 0235 Full Code [id number]  Bradly Bienenstock, NP ED   08/11/2019 0735 08/16/2019 2033 Full Code [id number]  Ivor Costa, MD ED   10/10/2015 1524 10/12/2015 1821 Full Code [id number]  Hower, Aaron Mose, MD ED   Advance Care Planning Activity     Family Communication: Spoke with son resting on the phone Disposition Plan: We will need a 3-4 more days of IV antibiotics while here in the hospital.  Will have to have bone biopsy results back to determine length of therapy.  We will have to see if they can do the wound VAC over at his facility.  Family also interested in whether hyperbaric chamber treatments would be helpful.  Consultants:  General surgery  Procedures:  Debridement of sacral  decubiti  Antibiotics:  Vancomycin  Cefepime  Flagyl  Time spent: 27 minutes   [place]
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
## 2019-12-20 NOTE — Progress Notes (Signed)
Admitted to room 221 from ED. Disoriented to person, place and time. Moaning with pain with just touching his arm, or anywhere on his body. CHG bath completed. Morphine given for pain. Foam pad bandages changed on sacrum, and bilateral ischium, also both heels. Foam intact to left forearm. Oriented to person after pain relief

## 2019-12-20 NOTE — Anesthesia Procedure Notes (Signed)
Procedure Name: Intubation Date/Time: 12/20/2019 9:55 AM Performed by: Justus Memory, CRNA Pre-anesthesia Checklist: Patient identified, Patient being monitored, Timeout performed, Emergency Drugs available and Suction available Patient Re-evaluated:Patient Re-evaluated prior to induction Oxygen Delivery Method: Circle system utilized Preoxygenation: Pre-oxygenation with 100% oxygen Induction Type: IV induction and Rapid sequence Laryngoscope Size: Mac and 3 Grade View: Grade I Tube type: Oral Tube size: 7.0 mm Number of attempts: 1 Airway Equipment and Method: Stylet and Video-laryngoscopy Placement Confirmation: ETT inserted through vocal cords under direct vision,  positive ETCO2 and breath sounds checked- equal and bilateral Secured at: 21 cm Tube secured with: Tape Dental Injury: Teeth and Oropharynx as per pre-operative assessment  Difficulty Due To: Difficulty was anticipated and Difficult Airway- due to anterior larynx Future Recommendations: Recommend- induction with short-acting agent, and alternative techniques readily available

## 2019-12-20 NOTE — Transfer of Care (Signed)
Immediate Anesthesia Transfer of Care Note  Patient: Joel Wilson.  Procedure(s) Performed: DEBRIDEMENT WOUND (N/A )  Patient Location: PACU  Anesthesia Type:General  Level of Consciousness: sedated  Airway & Oxygen Therapy: Patient Spontanous Breathing and Patient connected to face mask oxygen  Post-op Assessment: Report given to RN and Post -op Vital signs reviewed and stable  Post vital signs: Reviewed and stable  Last Vitals:  Vitals Value Taken Time  BP 158/90 12/20/19 1150  Temp    Pulse 100 12/20/19 1153  Resp 17 12/20/19 1153  SpO2 100 % 12/20/19 1153  Vitals shown include unvalidated device data.  Last Pain:  Vitals:   12/20/19 1150  TempSrc:   PainSc: 0-No pain         Complications: No apparent anesthesia complications

## 2019-12-20 NOTE — Anesthesia Postprocedure Evaluation (Signed)
Anesthesia Post Note  Patient: Joel Wilson.  Procedure(s) Performed: DEBRIDEMENT WOUND (N/A )  Patient location during evaluation: PACU Anesthesia Type: General Level of consciousness: awake and alert Pain management: pain level controlled Vital Signs Assessment: post-procedure vital signs reviewed and stable Respiratory status: spontaneous breathing, nonlabored ventilation, respiratory function stable and patient connected to nasal cannula oxygen Cardiovascular status: blood pressure returned to baseline and stable Postop Assessment: no apparent nausea or vomiting Anesthetic complications: no     Last Vitals:  Vitals:   12/20/19 1209 12/20/19 1226  BP: (!) 151/92 (!) 163/93  Pulse: 100 95  Resp: (!) 22 20  Temp:  36.4 C  SpO2: 97% 97%    Last Pain:  Vitals:   12/20/19 1226  TempSrc:   PainSc: 0-No pain                 Precious Haws [name]

## 2019-12-20 NOTE — Progress Notes (Signed)
15 minute call to floor. 
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
## 2019-12-20 NOTE — Op Note (Signed)
  Procedure Date:  12/20/2019  Pre-operative Diagnosis:  Infected Sacral decubitus ulcer, stage IV  Post-operative Diagnosis:  Infected Sacral decubitus ulcer, stage IV  Procedure:   1.  Debridement of infected sacral decubitus ulcer, including skin, subcutaneous tissue, muscle, and fascia for total area of 16 cm x 8 cm.   2. Sacral bone biopsy.  Surgeon:  Melvyn Neth, MD  Anesthesia:  General endotracheal  Estimated Blood Loss:  10 ml  Specimens:  Necrotic wound tissue, sacral bone tissue, both for cultures  Complications:  None  Indications for Procedure:  This is a 79 y.o. male with diagnosis of an infected stage IV sacral decubitus wound, requiring debridement.  The risks of bleeding, abscess or infection, injury to surrounding structures, and need for further procedures were all discussed with the patient and his family, and they were willing to proceed.  Description of Procedure: The patient was correctly identified in the preoperative area and brought into the operating room.  The patient was placed supine with VTE prophylaxis in place.  Appropriate time-outs were performed.  Anesthesia was induced and the patient was intubated.  Appropriate antibiotics were infused.  The patient was then placed in prone position.  The patient's sacral area was prepped and draped in usual sterile fashion.  Examining the wound, the initial measurements were 8 x 6 cm.  There was necrotic tissue throughout the entire wound bed, including some skin, subcutaneous tissue, muscle, and fascia.  All necrotic tissue was excised using cautery down to healthy bleeding tissue.  All the necrotic tissue was sent for tissue culture.  There was undermining of the wound on both left and right lateral aspects as well as superior aspect.  The sacrum was exposed and palpable and a rongeur was used to obtain a bone biopsy and this was sent for tissue culture as well.  After debridement was done, the outside wound  measurement was 10 x 7 cm, and the inner measurement was 16 x 8 cm.  Pulse lavage was used to thoroughly irrigate the wound.  Cautery was used for hemostasis.  After that, the skin was cleaned and a black sponge wound vac dressing was applied, with good seal, bridging toward the left anterior thigh.  The patient was then placed in supine position, emerged from anesthesia, extubated, and brought to the recovery room for further management.  The patient tolerated the procedure well and all counts were correct at the end of the case.   Melvyn Neth, MD

## 2019-12-21 DIAGNOSIS — Z8616 Personal history of COVID-19: Secondary | ICD-10-CM

## 2019-12-21 DIAGNOSIS — N400 Enlarged prostate without lower urinary tract symptoms: Secondary | ICD-10-CM

## 2019-12-21 DIAGNOSIS — T148XXS Other injury of unspecified body region, sequela: Secondary | ICD-10-CM

## 2019-12-21 DIAGNOSIS — B952 Enterococcus as the cause of diseases classified elsewhere: Secondary | ICD-10-CM | POA: Diagnosis not present

## 2019-12-21 DIAGNOSIS — L89154 Pressure ulcer of sacral region, stage 4: Secondary | ICD-10-CM | POA: Diagnosis not present

## 2019-12-21 DIAGNOSIS — N401 Enlarged prostate with lower urinary tract symptoms: Secondary | ICD-10-CM

## 2019-12-21 DIAGNOSIS — I2510 Atherosclerotic heart disease of native coronary artery without angina pectoris: Secondary | ICD-10-CM

## 2019-12-21 DIAGNOSIS — I129 Hypertensive chronic kidney disease with stage 1 through stage 4 chronic kidney disease, or unspecified chronic kidney disease: Secondary | ICD-10-CM

## 2019-12-21 DIAGNOSIS — E785 Hyperlipidemia, unspecified: Secondary | ICD-10-CM

## 2019-12-21 DIAGNOSIS — L89629 Pressure ulcer of left heel, unspecified stage: Secondary | ICD-10-CM

## 2019-12-21 DIAGNOSIS — R3914 Feeling of incomplete bladder emptying: Secondary | ICD-10-CM

## 2019-12-21 DIAGNOSIS — Z993 Dependence on wheelchair: Secondary | ICD-10-CM

## 2019-12-21 DIAGNOSIS — L89619 Pressure ulcer of right heel, unspecified stage: Secondary | ICD-10-CM

## 2019-12-21 DIAGNOSIS — B965 Pseudomonas (aeruginosa) (mallei) (pseudomallei) as the cause of diseases classified elsewhere: Secondary | ICD-10-CM | POA: Diagnosis not present

## 2019-12-21 DIAGNOSIS — L089 Local infection of the skin and subcutaneous tissue, unspecified: Secondary | ICD-10-CM

## 2019-12-21 DIAGNOSIS — W3400XS Accidental discharge from unspecified firearms or gun, sequela: Secondary | ICD-10-CM

## 2019-12-21 DIAGNOSIS — Z87828 Personal history of other (healed) physical injury and trauma: Secondary | ICD-10-CM

## 2019-12-21 DIAGNOSIS — N179 Acute kidney failure, unspecified: Secondary | ICD-10-CM

## 2019-12-21 DIAGNOSIS — Z87891 Personal history of nicotine dependence: Secondary | ICD-10-CM

## 2019-12-21 DIAGNOSIS — Z96642 Presence of left artificial hip joint: Secondary | ICD-10-CM

## 2019-12-21 DIAGNOSIS — G8220 Paraplegia, unspecified: Secondary | ICD-10-CM

## 2019-12-21 DIAGNOSIS — Z8701 Personal history of pneumonia (recurrent): Secondary | ICD-10-CM

## 2019-12-21 DIAGNOSIS — Z886 Allergy status to analgesic agent status: Secondary | ICD-10-CM

## 2019-12-21 DIAGNOSIS — I10 Essential (primary) hypertension: Secondary | ICD-10-CM

## 2019-12-21 DIAGNOSIS — Z960 Presence of urogenital implants: Secondary | ICD-10-CM

## 2019-12-21 DIAGNOSIS — I480 Paroxysmal atrial fibrillation: Secondary | ICD-10-CM

## 2019-12-21 DIAGNOSIS — Z7902 Long term (current) use of antithrombotics/antiplatelets: Secondary | ICD-10-CM

## 2019-12-21 DIAGNOSIS — N189 Chronic kidney disease, unspecified: Secondary | ICD-10-CM

## 2019-12-21 DIAGNOSIS — Z79899 Other long term (current) drug therapy: Secondary | ICD-10-CM

## 2019-12-21 LAB — CBC
HCT: 31.1 % — ABNORMAL LOW (ref 39.0–52.0)
Hemoglobin: 9.5 g/dL — ABNORMAL LOW (ref 13.0–17.0)
MCH: 28.7 pg (ref 26.0–34.0)
MCHC: 30.5 g/dL (ref 30.0–36.0)
MCV: 94 fL (ref 80.0–100.0)
Platelets: 391 10*3/uL (ref 150–400)
RBC: 3.31 MIL/uL — ABNORMAL LOW (ref 4.22–5.81)
RDW: 15.7 % — ABNORMAL HIGH (ref 11.5–15.5)
WBC: 21.5 10*3/uL — ABNORMAL HIGH (ref 4.0–10.5)
nRBC: 0 % (ref 0.0–0.2)

## 2019-12-21 LAB — BASIC METABOLIC PANEL
Anion gap: 9 (ref 5–15)
BUN: 42 mg/dL — ABNORMAL HIGH (ref 8–23)
CO2: 24 mmol/L (ref 22–32)
Calcium: 8 mg/dL — ABNORMAL LOW (ref 8.9–10.3)
Chloride: 111 mmol/L (ref 98–111)
Creatinine, Ser: 1.37 mg/dL — ABNORMAL HIGH (ref 0.61–1.24)
GFR calc Af Amer: 56 mL/min — ABNORMAL LOW (ref >60)
GFR calc non Af Amer: 49 mL/min — ABNORMAL LOW (ref >60)
Glucose, Bld: 105 mg/dL — ABNORMAL HIGH (ref 70–99)
Potassium: 4.5 mmol/L (ref 3.5–5.1)
Sodium: 144 mmol/L (ref 135–145)

## 2019-12-21 LAB — URINE CULTURE

## 2019-12-21 MED ORDER — PIPERACILLIN-TAZOBACTAM 3.375 G IVPB
3.3750 g | Freq: Three times a day (TID) | INTRAVENOUS | Status: DC
  Administered 2019-12-21 – 2019-12-22 (×2): 3.375 g via INTRAVENOUS
  Filled 2019-12-21 (×2): qty 50

## 2019-12-21 MED ORDER — VANCOMYCIN HCL 1250 MG/250ML IV SOLN
1250.0000 mg | INTRAVENOUS | Status: DC
  Filled 2019-12-21: qty 250

## 2019-12-21 MED ORDER — COLLAGENASE 250 UNIT/GM EX OINT
TOPICAL_OINTMENT | Freq: Every day | CUTANEOUS | Status: DC
  Filled 2019-12-21: qty 30

## 2019-12-21 MED ORDER — ADULT MULTIVITAMIN LIQUID CH
15.0000 mL | Freq: Every day | ORAL | Status: DC
  Filled 2019-12-21: qty 15

## 2019-12-21 MED ORDER — DOCUSATE SODIUM 50 MG/5ML PO LIQD
100.0000 mg | Freq: Two times a day (BID) | ORAL | Status: DC
  Administered 2019-12-21: 100 mg via ORAL
  Filled 2019-12-21 (×3): qty 10

## 2019-12-21 MED ORDER — PANTOPRAZOLE SODIUM 40 MG PO PACK
40.0000 mg | PACK | Freq: Every day | ORAL | Status: DC
  Filled 2019-12-21: qty 20

## 2019-12-21 NOTE — Consult Note (Signed)
WOC Nurse Consult Note: Reason for Consult:recent surgical debridement stage 4 sacral pressure injury Has right lateral leg full thickness lesion Bilateral intact stage 1 pressure injury to heels.   Bilateral ischial wounds, unstageable pressure injury Wound type:pressure injury Pressure Injury POA: Yes Measurement: NPWT to be changed Tues to sacrum with surgical team Bilateral heels with 1 cm intact erythema nonblanchable Right lateral lower leg:  2.5 cm  X 0.3 cm with 25% devitalized tissue present Wound UYE:BXIDH red with dark devitalized tissue Drainage (amount, consistency, odor) minimal serosanguinous  No odor.   Periwound:intact Dressing procedure/placement/frequency: Cleanse right leg and bilateral ischial wounds with Ns and pat dry. APPLy Santyl to wound bed. Cover with NS moist gauze.  Secure with silicone foam dressing.  Peel back foam and reapply Santyl daily and change foam every three days.   Will not follow at this time.  Please re-consult if needed.  Domenic Moras MSN, RN, FNP-BC CWON Wound, Ostomy, Continence Nurse Pager [phone]

## 2019-12-21 NOTE — Progress Notes (Signed)
Patient ID: Joel Wilson., male   DOB: 31-Jan-1941, 79 y.o.   MRN: [id number] Joel Wilson PROGRESS NOTE  Joel Wilson. Joel Wilson:[id number] DOB: [birth year] DOA: 12/19/2019 Joel Wilson: Joel Wilson, Joel Wilson  HPI/Subjective: Patient was also yelling out that Joel Wilson is his Savior.  I was able to focus him to ask him some questions.  He is able to answer questions appropriately.  States he has a lot of pain with this backside.  Objective: Vitals:   12/21/19 1309 12/21/19 1337  BP: (!) 131/107 104/61  Pulse:  (!) 110  Resp:  15  Temp:  (!) 97.5 F (36.4 C)  SpO2:  100%    Intake/Output Summary (Last 24 hours) at 12/21/2019 1426 Last data filed at 12/21/2019 1019 Gross per 24 hour  Intake 2165.66 ml  Output 650 ml  Net 1515.66 ml   Filed Weights   12/19/19 1100 12/19/19 1101 12/20/19 0247  Weight: 76 kg 76.4 kg 74.1 kg    ROS: Review of Systems  Constitutional: Negative for fever.  Eyes: Negative for blurred vision.  Respiratory: Negative for shortness of breath.   Cardiovascular: Negative for chest pain.  Gastrointestinal: Negative for abdominal pain, nausea and vomiting.  Genitourinary: Negative for dysuria.  Musculoskeletal: Positive for back pain and joint pain.  Neurological: Negative for dizziness.   Exam: Physical Exam  HENT:  Nose: No mucosal edema.  Mouth/Throat: No oropharyngeal exudate or posterior oropharyngeal edema.  Eyes: Conjunctivae and lids are normal.  Neck: Carotid bruit is not present.  Cardiovascular: S1 normal and S2 normal. Exam reveals no gallop.  No murmur heard. Respiratory: No respiratory distress. He has decreased breath sounds in the right lower field and the left lower field. He has no wheezes. He has no rhonchi. He has no rales.  GI: Soft. Bowel sounds are normal. There is no abdominal tenderness.  Musculoskeletal:     Right ankle: Swelling present.     Left ankle: Swelling present.  Lymphadenopathy:    He has no cervical adenopathy.   Neurological: He is alert.  Skin: Skin is warm. Nails show no clubbing.  As per surgeon stage IV decubiti buttock Right heel erythema which I would classify as stage I Left heel stage II decubiti  Psychiatric: He has a normal mood and affect.      Data Reviewed: Basic Metabolic Panel: Recent Labs  Lab 12/19/19 1104 12/20/19 0439 12/21/19 0612  NA 138 141 144  K 2.6* 4.4 4.5  CL 95* 106 111  CO2 28 26 24   GLUCOSE 129* 101* 105*  BUN 52* 45* 42*  CREATININE 2.12* 1.51* 1.37*  CALCIUM 8.3* 8.1* 8.0*  MG 1.8  --   --    Liver Function Tests: Recent Labs  Lab 12/19/19 1104  AST 31  ALT 24  ALKPHOS 58  BILITOT 1.2  PROT 7.0  ALBUMIN 2.1*   CBC: Recent Labs  Lab 12/19/19 1104 12/20/19 0439 12/21/19 0612  WBC 25.0* 16.1* 21.5*  NEUTROABS 20.6*  --   --   HGB 11.3* 10.3* 9.5*  HCT 34.7* 31.8* 31.1*  MCV 91.6 92.2 94.0  PLT 477* 367 391   BNP (last 3 results) Recent Labs    09/25/19 0027 09/26/19 0142 09/27/19 0326  BNP 174.6* 96.9 198.8*     Recent Results (from the past 240 hour(s))  Blood Culture (routine x 2)     Status: None (Preliminary result)   Collection Time: 12/19/19 11:05 AM   Specimen: BLOOD  Result Value  Ref Range Status   Specimen Description BLOOD RAC  Final   Special Requests   Final    BOTTLES DRAWN AEROBIC AND ANAEROBIC Blood Culture adequate volume   Culture   Final    NO GROWTH 2 DAYS Performed at [facility], [address] [place], [place] [address]    Report Status PENDING  Incomplete  Blood Culture (routine x 2)     Status: None (Preliminary result)   Collection Time: 12/19/19 11:52 AM   Specimen: BLOOD  Result Value Ref Range Status   Specimen Description BLOOD RIGHT ANTECUBITAL  Final   Special Requests   Final    BOTTLES DRAWN AEROBIC AND ANAEROBIC Blood Culture adequate volume   Culture   Final    NO GROWTH 2 DAYS Performed at [facility], [address] [place], [place] [address]     Report Status PENDING  Incomplete  Urine culture     Status: Abnormal   Collection Time: 12/19/19 12:17 PM   Specimen: In/Out Cath Urine  Result Value Ref Range Status   Specimen Description   Final    IN/OUT CATH URINE Performed at [facility], [address] [place], [place] [address]    Special Requests   Final    NONE Performed at [facility], [address] [place],  [address]    Culture MULTIPLE SPECIES PRESENT, SUGGEST RECOLLECTION (A)  Final   Report Status 12/21/2019 FINAL  Final  Respiratory Panel by RT PCR (Flu A&B, Covid) - Nasopharyngeal Swab     Status: None   Collection Time: 12/19/19 12:27 PM   Specimen: Nasopharyngeal Swab  Result Value Ref Range Status   SARS Coronavirus 2 by RT PCR NEGATIVE NEGATIVE Final    Comment: (NOTE) SARS-CoV-2 target nucleic acids are NOT DETECTED. The SARS-CoV-2 RNA is generally detectable in upper respiratoy specimens during the acute phase of infection. The lowest concentration of SARS-CoV-2 viral copies this assay can detect is 131 copies/mL. A negative result does not preclude SARS-Cov-2 infection and should not be used as the sole basis for treatment or other patient management decisions. A negative result may occur with  improper specimen collection/handling, submission of specimen other than nasopharyngeal swab, presence of viral mutation(s) within the areas targeted by this assay, and inadequate number of viral copies (<131 copies/mL). A negative result must be combined with clinical observations, patient history, and epidemiological information. The expected result is Negative. Fact Sheet for Patients:  PinkCheek.be Fact Sheet for Healthcare Providers:  GravelBags.it This test is not yet ap proved or cleared by the [place] FDA and  has been authorized for detection and/or diagnosis of SARS-CoV-2 by FDA under an Emergency Use  Authorization (EUA). This EUA will remain  in effect (meaning this test can be used) for the duration of the COVID-19 declaration under Section 564(b)(1) of the Act, 21 U.S.C. section 360bbb-3(b)(1), unless the authorization is terminated or revoked sooner.    Influenza A by PCR NEGATIVE NEGATIVE Final   Influenza B by PCR NEGATIVE NEGATIVE Final    Comment: (NOTE) The Xpert Xpress SARS-CoV-2/FLU/RSV assay is intended as an aid in  the diagnosis of influenza from Nasopharyngeal swab specimens and  should not be used as a sole basis for treatment. Nasal washings and  aspirates are unacceptable for Xpert Xpress SARS-CoV-2/FLU/RSV  testing. Fact Sheet for Patients: PinkCheek.be Fact Sheet for Healthcare Providers: GravelBags.it This test is not yet approved or cleared by the [place] and  has been authorized for detection and/or diagnosis of SARS-CoV-2 by  FDA under an Emergency Use Authorization (EUA). This EUA will remain  in effect (meaning this test can be used) for the duration of the  Covid-19 declaration under Section 564(b)(1) of the Act, 21  U.S.C. section 360bbb-3(b)(1), unless the authorization is  terminated or revoked. Performed at [facility], [address], [place], [place] [address]   Surgical PCR screen     Status: Abnormal   Collection Time: 12/20/19  6:51 AM   Specimen: Nasal Mucosa; Nasal Swab  Result Value Ref Range Status   MRSA, PCR POSITIVE (A) NEGATIVE Final    Comment: RESULT CALLED TO, READ BACK BY AND VERIFIED WITH: JODIE SMITH AT 0539 12/20/19.PMF    Staphylococcus aureus POSITIVE (A) NEGATIVE Final    Comment: (NOTE) The Xpert SA Assay (FDA approved for NASAL specimens in patients 71 years of age and older), is one component of a comprehensive surveillance program. It is not intended to diagnose infection nor to guide or monitor treatment. Performed at [facility], [address], [place], [place] [address]   Aerobic/Anaerobic Culture (surgical/deep wound)     Status: None (Preliminary result)   Collection Time: 12/20/19 10:50 AM   Specimen: PATH Other; Tissue  Result Value Ref Range Status   Specimen Description   Final    TISSUE Performed at [facility], [address], [place], [place] [address]    Special Requests SACRAL WOUND TISSUE  Final   Gram Stain   Final    ABUNDANT WBC PRESENT,BOTH PMN AND MONONUCLEAR MODERATE GRAM POSITIVE COCCI IN PAIRS RARE GRAM NEGATIVE RODS Performed at [facility], [place] [address], [place], [place] [address]    Culture   Final    ABUNDANT PSEUDOMONAS AERUGINOSA ABUNDANT ENTEROCOCCUS FAECALIS    Report Status PENDING  Incomplete  Aerobic/Anaerobic Culture (surgical/deep wound)     Status: None (Preliminary result)   Collection Time: 12/20/19 10:50 AM   Specimen: PATH Bone biopsy; Tissue  Result Value Ref Range Status   Specimen Description   Final    BONE Performed at [facility], [address] [place], [place] [address]    Special Requests   Final    BIOPSY Performed at [facility], [address] [place], [place] [address]    Gram Stain   Final    RARE WBC PRESENT, PREDOMINANTLY PMN NO ORGANISMS SEEN Performed at [facility], [place] [address] [place], [place] [address]    Culture RARE ENTEROCOCCUS FAECALIS  Final   Report Status PENDING  Incomplete     Scheduled Meds: . amiodarone  200 mg Oral QHS  . amLODipine  5 mg Oral Daily  . bisacodyl  5 mg Oral Daily  . clopidogrel  75 mg Oral Daily  . collagenase   Topical Daily  . docusate sodium  100 mg Oral BID  . feeding supplement (ENSURE ENLIVE)  237 mL Oral TID BM  . finasteride  5 mg Oral Daily  . gabapentin  300 mg Oral TID  . heparin  5,000 Units Subcutaneous Q8H  . lidocaine  1 patch Transdermal Daily  . multivitamin with minerals  1 tablet Oral Daily  . mupirocin ointment  1  application Nasal BID  . oxyCODONE  10 mg Oral TID  . [START ON 12/22/2019] pantoprazole sodium  40 mg Per Tube Daily  . simvastatin  10 mg Oral QHS  . tamsulosin  0.4 mg Oral Daily   Continuous Infusions: . sodium chloride 250 mL (12/21/19 0550)  . ceFEPime (MAXIPIME) IV 2 g (12/21/19 1405)  . metronidazole 500 mg (12/21/19 0649)  . [START ON 12/22/2019] vancomycin      Assessment/Plan:  1. Infected decubitus ulcer stage IV.  Present on admission patient taken to the operating room for debridement 4/18 by Dr. Hampton Abbot.  General surgery to reevaluate tomorrow to see if further debridement needed.  The facility can do the wound VAC but needs to know 24 hours in advance prior to discharge.  Wound culture growing Pseudomonas and Enterococcus.  Bone biopsy pending but rare Enterococcus seen.  Await infectious disease Joel opinion on length of therapy of antibiotics and whether IV or oral will suffice.  Currently on on vancomycin, Maxipime and and Flagyl.  Pain control with morphine and oxycodone.  White blood cell count elevated 21.5. 2. Acute kidney injury.  Creatinine improved from 2.12 down to 1.37. 3. Hypokalemia improved from 2.6 up to 4.5. 4. Essential hypertension.  Blood pressure very variable depending on pain. 5. Paroxysmal atrial fibrillation on amiodarone 6. Bilateral heel decubiti.  Present on admission.  Local wound care. 7. BPH with incomplete bladder emptying on Flomax and Proscar, in and out caths q. 8. 8. GERD on Protonix 9. Hyperlipidemia unspecified on Zocor  Code Status:     Code Status Orders  (From admission, onward)         Start     Ordered   12/19/19 1213  Full code  Continuous     12/19/19 1217        Code Status History    Date Active Date Inactive Code Status Order ID Comments User Context   09/23/2019 1228 09/27/2019 1828 Full Code [id number]  Thurnell Lose, MD Inpatient   09/22/2019 1738 09/23/2019 1114 Full Code [id number]  Wyvonnia Dusky, MD  ED   09/04/2019 0454 09/09/2019 0235 Full Code [id number]  Bradly Bienenstock, NP ED   08/11/2019 0735 08/16/2019 2033 Full Code [id number]  Ivor Costa, MD ED   10/10/2015 1524 10/12/2015 1821 Full Code [id number]  Hower, Aaron Mose, MD ED   Advance Care Planning Activity     Family Communication: Spoke with son Stormy Card on the phone.  He will come visit today. Disposition Plan: General surgery to look at his wound tomorrow to see if he needs further debridement.  Wound VAC can be done at his rehab facility but they need to know 24 hours in advance prior to discharge to obtain a wound VAC.  Awaiting infectious disease consultation to determine length of therapy and IV versus p.o. treatment.  Wound culture growing out Pseudomonas and Enterococcus.  Bone culture with rare Enterococcus.  Likely will need 2 more days of IV antibiotics here in the hospital.  Would like to see bone culture results finalized prior to disposition.   Consultants:  General surgery  Procedures:  Debridement of sacral decubiti  Antibiotics:  Vancomycin  Cefepime  Flagyl  Time spent: 27 minutes   [place]

## 2019-12-21 NOTE — Evaluation (Signed)
Clinical/Bedside Swallow Evaluation Patient Details  Name: Harmon Bommarito. MRN: [id number] Date of Birth: 03-19-1941  Today's Date: 12/21/2019 Time: SLP Start Time (ACUTE ONLY): 1300 SLP Stop Time (ACUTE ONLY): 1402 SLP Time Calculation (min) (ACUTE ONLY): 62 min  Past Medical History:  Past Medical History:  Diagnosis Date  . 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 Coronary artery disease    a. 2003 PCI to Ramus;  b. 11/2012 Cath: LM nl, LAD 30, LCX nl, RI 40 ISR, RCA nl.  . Hereditary and idiopathic peripheral neuropathy   . Hyperlipidemia   . Hypertension   . Hypertensive heart disease    a. 08/2013 Echo: EF 70-75%, mild LVH.  Marland Kitchen Insomnia   . Paroxysmal atrial fibrillation (HCC)    a.08/2013-->amio;  b. CHA2DS2VASc = 4-->no longer on coumadin.  . Vitamin D deficiency    Past Surgical History:  Past Surgical History:  Procedure Laterality Date  . CARDIAC CATHETERIZATION  11/13/12   [facility]; EF >55%  . CARDIAC CATHETERIZATION  2003  . JOINT REPLACEMENT     left hip  . NECK SURGERY    . WOUND DEBRIDEMENT N/A 12/20/2019   Procedure: DEBRIDEMENT WOUND;  Surgeon: Olean Ree, MD;  Location: [facility] ORS;  Service: General;  Laterality: N/A;   HPI:  Per admitting history and Physical" " Nathanyel Defenbaugh. is a 79 y.o. male with medical history significant for hypertension, paroxysmal A. fib, BPH with chronic indwelling Foley catheter who was brought into the emergency room by EMS for evaluation of worsening pain.  According to EMS they were called out to the patient's nursing facility because patient has been crying out in pain, he has chronic pain secondary to a known sacral decubitus ulcer as well as lower extremity pressure injuries but appears to be in more pain than normal today."   Assessment / Plan / Recommendation Clinical Impression  Pt presents with moderate dysphagia at bedside with inconsistent s/s of aspiration with thin liquids ONLY when taking multiple  sips. It should be noted that maximum encouragement was needed for Pt to take any PO's beyond plain water. Positioning was also difficult as Pt has a sacral wound with a wound vac. Pt was positioned on his side but with head elevated so that chin was parallel to the floor for safest swallowing. Pt tolerated nectar thick liquids well but continually asked for "plain water". Noted coughing after multiple sips of thin from the straw but it is positive to note that when ST controlled the amount taken from the straw, Pt tolerated the thin liquid sips without s/s of aspiration. Pt swallowed crushed meds in applesauce but needed several extra bites and sips of thin to take the whole pills. Pt tolerated the applesauce, and the magic cup without difficulty but had to spit out the pureed meats. Rec continue with Dysphagia 1 diet with nectar thick liquids to be sent on trays, HOWEVER, Pt may have thin SINGLE sips of water when requested as well as to assist with meds crushed in applesauce when seated fully upright. Rec consider changing any non crushable meds. Discussed with Nsg. ST to follow up with toleration of diet and advance as indicated. SLP Visit Diagnosis: Dysphagia, oropharyngeal phase (R13.12)    Aspiration Risk  Mild aspiration risk;Moderate aspiration risk    Diet Recommendation Dysphagia 1 (Puree);Nectar-thick liquid;Other (Comment)(May have thin liquids when fully upright, single sips)   Liquid Administration via: Straw Medication Administration: Crushed with puree Supervision: Staff to  assist with self feeding Compensations: Small sips/bites;Slow rate Postural Changes: Seated upright at 90 degrees;Remain upright for at least 30 minutes after po intake    Other  Recommendations Oral Care Recommendations: Oral care BID   Follow up Recommendations Skilled Nursing facility      Frequency and Duration min 3x week  1 week       Prognosis Prognosis for Safe Diet Advancement: Guarded Barriers  to Reach Goals: Cognitive deficits      Swallow Study   General Date of Onset: 12/19/19 HPI: Per admitting history and Physical" " Mancil Pfenning. is a 79 y.o. male with medical history significant for hypertension, paroxysmal A. fib, BPH with chronic indwelling Foley catheter who was brought into the emergency room by EMS for evaluation of worsening pain.  According to EMS they were called out to the patient's nursing facility because patient has been crying out in pain, he has chronic pain secondary to a known sacral decubitus ulcer as well as lower extremity pressure injuries but appears to be in more pain than normal today." Type of Study: Bedside Swallow Evaluation Diet Prior to this Study: Dysphagia 1 (puree) Temperature Spikes Noted: No Behavior/Cognition: Alert;Confused;Impulsive Oral Cavity - Dentition: Poor condition;Missing dentition Self-Feeding Abilities: Total assist Patient Positioning: Upright in bed;Postural control interferes with function;Other (comment)(Difficult to position upright wiht sacral wound) Baseline Vocal Quality: Breathy;Hoarse Volitional Cough: Weak    Oral/Motor/Sensory Function Overall Oral Motor/Sensory Function: Moderate impairment Facial Symmetry: Within Functional Limits Facial Strength: Within Functional Limits Lingual ROM: Within Functional Limits Lingual Symmetry: Within Functional Limits Lingual Strength: Within Functional Limits   Ice Chips Ice chips: Impaired Presentation: Spoon Oral Phase Impairments: Reduced labial seal   Thin Liquid Thin Liquid: Impaired Presentation: Straw;Cup Oral Phase Impairments: Reduced labial seal Oral Phase Functional Implications: Prolonged oral transit;Oral holding Pharyngeal  Phase Impairments: Cough - Delayed;Other (comments)(x1 after multiple sips)    Nectar Thick Nectar Thick Liquid: Within functional limits   Honey Thick Honey Thick Liquid: Not tested   Puree Puree: Within functional  limits Presentation: Spoon   Solid     Solid: Not tested      Lucila Maine 12/21/2019,2:02 PM
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
## 2019-12-21 NOTE — Consult Note (Signed)
Pharmacy Antibiotic Note  Joel Wilson. is a 79 y.o. male admitted on 12/19/2019 with sepsissecondary to wound infection .  Pharmacy was consulted for cefepime and vancomycin dosing. This is day  # 3 of IV antibiotics, renal function continues to improve but baseline is unclear, leukocytosis has improved slightly since admission but remains elevated; no recent febrile episodes.  Plan: 1. SCr improved: adjust dose to  Vancomycin 1250 mg IV Q 24 hrs Goal AUC 400-550 Expected AUC: 553.5 SCr used: 1.37 Css: 36.8/14.2 ug/mL   2. Continue cefepime 2g IV every 12 hours    Height: 5\' 11"  (180.3 cm) Weight: 74.1 kg (163 lb 5.8 oz) IBW/kg (Calculated) : 75.3  Temp (24hrs), Avg:97.8 F (36.6 C), Min:97.6 F (36.4 C), Max:98.3 F (36.8 C)  Recent Labs  Lab 12/19/19 1104 12/19/19 1359 12/20/19 0439 12/21/19 0612  WBC 25.0*  --  16.1* 21.5*  CREATININE 2.12*  --  1.51* 1.37*  LATICACIDVEN 2.5* 2.2*  --   --     Estimated Creatinine Clearance: 45.8 mL/min (A) (by C-G formula based on SCr of 1.37 mg/dL (H)).    Antimicrobials this admission: 4/17 cefepime  >>  4/17 vancomycin >>  4/17 flagyl>>   Microbiology results: 4/17 BCx: NGTD 4/17 UCx: multiple species 4/18 MRSA PCR + 4/18 Wound Cx (tissue): mod GPC in pairs, rare GNR 4/18 Wound Cx (bone): NGTD Thank you for allowing pharmacy to be a part of this patient's care.  Dallie Piles, PharmD, BCPS Clinical Pharmacist 12/21/2019 9:17 AM

## 2019-12-21 NOTE — TOC Progression Note (Signed)
Transition of Care (TOC) - Progression Note    Patient Details  Name: Jarriel Papillion. MRN: [id number] Date of Birth: 1941-03-23  [facility]) CM/SW Contact  Beverly Sessions, RN Phone Number: 12/21/2019, 1:57 PM  Clinical Narrative:     Confirmed with Claiborne Billings at [facility] that patient can return with wound vac in place.  Facility needs a 24 hour noticed prior to discharge to order the vac.  MD's notified   Expected Discharge Plan: Long Term Nursing Home Barriers to Discharge: No Barriers Identified  Expected Discharge Plan and Services Expected Discharge Plan: Long Term Nursing Home In-house Referral: Clinical Social Work                                             Social Determinants of Health (SDOH) Interventions    Readmission Risk Interventions Readmission Risk Prevention Plan 09/27/2019 09/08/2019 09/06/2019  Transportation Screening Complete Complete Complete  PCP or Specialist Appt within 3-5 Days Complete (No Data) -  [place] or Home Care Consult Complete - Patient refused  Social Work Consult for [place] Planning/Counseling Complete - Complete  Palliative Care Screening Not Applicable - Not Applicable  Medication Review Press photographer) Complete Complete Complete  Some recent data might be hidden

## 2019-12-21 NOTE — Progress Notes (Signed)
[place] Hospital Day(s): 2.   Post op day(s): 1 Day Post-Op.   Interval History:  Patient seen and examined no acute events or new complaints overnight.  Patient resting comfortably No fever, chills Leukocytosis is worse this morning to 21.5K; afebrile Renal function has improved some, sCr 1.37 (down from 1.51); UO - 600 ccs Wound vac with 50 ccs in last 24 hours; tentative plan for change on Tuesday. Cultures pending   Vital signs in last 24 hours: [min-max] current  Temp:  [97.6 F (36.4 C)-98.3 F (36.8 C)] 97.7 F (36.5 C) (04/19 0558) Pulse Rate:  [83-129] 129 (04/19 0558) Resp:  [16-22] 18 (04/19 0558) BP: (103-172)/(68-138) 132/78 (04/19 0558) SpO2:  [88 %-100 %] 98 % (04/19 0558)     Height: 5\' 11"  (180.3 cm) Weight: 74.1 kg BMI (Calculated): 22.79   Intake/Output last 2 shifts:  04/18 0701 - 04/19 0700 In: 3045.7 [P.O.:120; I.V.:2375.7; IV Piggyback:550] Out: 283 [Urine:600; Drains:50; Blood:5]   Physical Exam:  Constitutional: alert, cooperative and no distress  Respiratory: breathing non-labored at rest  Cardiovascular: regular rate and sinus rhythm  Integumentary: Wound vac to sacral ulceration, good seal, serosanguinous output  Labs:  CBC Latest Ref Rng & Units 12/21/2019 12/20/2019 12/19/2019  WBC 4.0 - 10.5 K/uL 21.5(H) 16.1(H) 25.0(H)  Hemoglobin 13.0 - 17.0 g/dL 9.5(L) 10.3(L) 11.3(L)  Hematocrit 39.0 - 52.0 % 31.1(L) 31.8(L) 34.7(L)  Platelets 150 - 400 K/uL 391 367 477(H)   CMP Latest Ref Rng & Units 12/21/2019 12/20/2019 12/19/2019  Glucose 70 - 99 mg/dL 105(H) 101(H) 129(H)  BUN 8 - 23 mg/dL 42(H) 45(H) 52(H)  Creatinine 0.61 - 1.24 mg/dL 1.37(H) 1.51(H) 2.12(H)  Sodium 135 - 145 mmol/L 144 141 138  Potassium 3.5 - 5.1 mmol/L 4.5 4.4 2.6(LL)  Chloride 98 - 111 mmol/L 111 106 95(L)  CO2 22 - 32 mmol/L 24 26 28   Calcium 8.9 - 10.3 mg/dL 8.0(L) 8.1(L) 8.3(L)  Total Protein 6.5 - 8.1 g/dL - - 7.0  Total  Bilirubin 0.3 - 1.2 mg/dL - - 1.2  Alkaline Phos 38 - 126 U/L - - 58  AST 15 - 41 U/L - - 31  ALT 0 - 44 U/L - - 24     Imaging studies: No new pertinent imaging studies   Assessment/Plan:  79 y.o. male 1 Day Post-Op s/p excisional debridement, sacral bone biopsy, and placement of wound vac for infected stage IV sacral decubitus ulcer, complicated by pertinent comorbidities including wheelchair bound state.   - Okay to continue diet  - Continue IV Abx (Cefepime, Flagyl, Vancomycin); follow up Cx; ID following  - Will plan for first wound vac change likely Tuesday 04/20; [place] RN consulted for assistance   - Pain control prn   - further management per primary team; we will follow   All of the above findings and recommendations were discussed with the medical team  -- Edison Simon, PA-C [facility] 12/21/2019, 7:32 AM [phone] M-F: 7am - 4pm

## 2019-12-21 NOTE — Consult Note (Signed)
Pharmacy Antibiotic Note  Joel Wilson. is a 79 y.o. male admitted on 12/19/2019 with sepsissecondary to wound infection .  Pharmacy was consulted for Zosyn dosing.  This is day  # 4 of IV antibiotics ID following  Plan: ID has changed abx to Zosyn Will order Zosyn 3.375gm EI IV q8h   Height: 5\' 11"  (180.3 cm) Weight: 74.1 kg (163 lb 5.8 oz) IBW/kg (Calculated) : 75.3  Temp (24hrs), Avg:97.7 F (36.5 C), Min:97.5 F (36.4 C), Max:97.8 F (36.6 C)  Recent Labs  Lab 12/19/19 1104 12/19/19 1359 12/20/19 0439 12/21/19 0612  WBC 25.0*  --  16.1* 21.5*  CREATININE 2.12*  --  1.51* 1.37*  LATICACIDVEN 2.5* 2.2*  --   --     Estimated Creatinine Clearance: 45.8 mL/min (A) (by C-G formula based on SCr of 1.37 mg/dL (H)).    Antimicrobials this admission: 4/17 cefepime  >> 4/19 4/17 vancomycin >> 4/19 4/17 flagyl>> 4/19 4/19 zosyn >>  Microbiology results: 4/17 BCx: NGTD 4/17 UCx: multiple species 4/18 MRSA PCR + 4/18 Wound Cx (tissue): mod GPC in pairs, rare GNR 4/18 Wound Cx (bone): NGTD Thank you for allowing pharmacy to be a part of this patient's care.  Noralee Space, PharmD, BCPS Clinical Pharmacist 12/21/2019 5:51 PM

## 2019-12-22 DIAGNOSIS — Z515 Encounter for palliative care: Secondary | ICD-10-CM

## 2019-12-22 DIAGNOSIS — Z66 Do not resuscitate: Secondary | ICD-10-CM

## 2019-12-22 DIAGNOSIS — Z7189 Other specified counseling: Secondary | ICD-10-CM

## 2019-12-22 LAB — URINALYSIS, COMPLETE (UACMP) WITH MICROSCOPIC
Bacteria, UA: NONE SEEN
Bilirubin Urine: NEGATIVE
Glucose, UA: NEGATIVE mg/dL
Ketones, ur: NEGATIVE mg/dL
Nitrite: NEGATIVE
Protein, ur: NEGATIVE mg/dL
RBC / HPF: >50 RBC/hpf — ABNORMAL HIGH (ref 0–5)
Specific Gravity, Urine: 1.012 (ref 1.005–1.030)
WBC, UA: >50 WBC/hpf — ABNORMAL HIGH (ref 0–5)
pH: 5 (ref 5.0–8.0)

## 2019-12-22 LAB — CBC
HCT: 23.9 % — ABNORMAL LOW (ref 39.0–52.0)
Hemoglobin: 8 g/dL — ABNORMAL LOW (ref 13.0–17.0)
MCH: 29.6 pg (ref 26.0–34.0)
MCHC: 33.5 g/dL (ref 30.0–36.0)
MCV: 88.5 fL (ref 80.0–100.0)
Platelets: 265 K/uL (ref 150–400)
RBC: 2.7 MIL/uL — ABNORMAL LOW (ref 4.22–5.81)
RDW: 15.7 % — ABNORMAL HIGH (ref 11.5–15.5)
WBC: 16.3 K/uL — ABNORMAL HIGH (ref 4.0–10.5)
nRBC: 0 % (ref 0.0–0.2)

## 2019-12-22 LAB — CREATININE, SERUM
Creatinine, Ser: 1.18 mg/dL (ref 0.61–1.24)
GFR calc Af Amer: >60 mL/min
GFR calc non Af Amer: 58 mL/min — ABNORMAL LOW

## 2019-12-22 MED ORDER — MORPHINE 100MG IN NS 100ML (1MG/ML) PREMIX INFUSION
4.0000 mg/h | INTRAVENOUS | Status: DC
  Administered 2019-12-22 – 2019-12-23 (×3): 4 mg/h via INTRAVENOUS
  Filled 2019-12-22 (×2): qty 100

## 2019-12-22 MED ORDER — BIOTENE DRY MOUTH MT LIQD: 15.0000 mL | Freq: Two times a day (BID) | OROMUCOSAL | Status: AC

## 2019-12-22 MED ORDER — HALOPERIDOL 0.5 MG PO TABS
0.5000 mg | ORAL_TABLET | ORAL | Status: DC | PRN
  Filled 2019-12-22: qty 1

## 2019-12-22 MED ORDER — MORPHINE 100MG IN NS 100ML (1MG/ML) PREMIX INFUSION: 4.0000 mg/h | INTRAVENOUS | Status: AC

## 2019-12-22 MED ORDER — GLYCOPYRROLATE 0.2 MG/ML IJ SOLN: 0.2000 mg | INTRAMUSCULAR | Status: AC | PRN

## 2019-12-22 MED ORDER — MORPHINE BOLUS VIA INFUSION: 4.0000 mg | INTRAVENOUS | 0 refills | Status: AC | PRN

## 2019-12-22 MED ORDER — POLYVINYL ALCOHOL 1.4 % OP SOLN
1.0000 [drp] | Freq: Four times a day (QID) | OPHTHALMIC | Status: DC | PRN
  Filled 2019-12-22: qty 15

## 2019-12-22 MED ORDER — GLYCOPYRROLATE 0.2 MG/ML IJ SOLN
0.2000 mg | INTRAMUSCULAR | Status: DC | PRN
  Filled 2019-12-22: qty 1

## 2019-12-22 MED ORDER — BIOTENE DRY MOUTH MT LIQD
15.0000 mL | Freq: Two times a day (BID) | OROMUCOSAL | Status: DC
  Administered 2019-12-23: 15 mL via TOPICAL

## 2019-12-22 MED ORDER — HALOPERIDOL LACTATE 5 MG/ML IJ SOLN: 0.5000 mg | INTRAMUSCULAR | Status: AC | PRN

## 2019-12-22 MED ORDER — HALOPERIDOL LACTATE 5 MG/ML IJ SOLN: 0.5000 mg | INTRAMUSCULAR | Status: DC | PRN

## 2019-12-22 MED ORDER — BIOTENE DRY MOUTH MT LIQD: 15.0000 mL | OROMUCOSAL | Status: DC | PRN

## 2019-12-22 MED ORDER — ONDANSETRON HCL 4 MG/2ML IJ SOLN: 4.0000 mg | Freq: Four times a day (QID) | INTRAMUSCULAR | 0 refills | Status: AC | PRN

## 2019-12-22 MED ORDER — HALOPERIDOL LACTATE 2 MG/ML PO CONC
0.5000 mg | ORAL | Status: DC | PRN
  Filled 2019-12-22: qty 0.3

## 2019-12-22 MED ORDER — POLYVINYL ALCOHOL 1.4 % OP SOLN: 1.0000 [drp] | Freq: Four times a day (QID) | OPHTHALMIC | 0 refills | Status: AC | PRN

## 2019-12-22 MED ORDER — GLYCOPYRROLATE 1 MG PO TABS
1.0000 mg | ORAL_TABLET | ORAL | Status: DC | PRN
  Filled 2019-12-22: qty 1

## 2019-12-22 MED ORDER — MORPHINE BOLUS VIA INFUSION
4.0000 mg | INTRAVENOUS | Status: DC | PRN
  Filled 2019-12-22: qty 4

## 2019-12-22 NOTE — Progress Notes (Signed)
Patient ID: Joel Martelli., male   DOB: 05-Sep-1940, 79 y.o.   MRN: [id number] [facility] PROGRESS NOTE  Joel Wilson. WUJ:[id number] DOB: [birth year] DOA: 12/19/2019 PCP: [facility]  HPI/Subjective: Patient with pain all over.  Both his son Joel Wilson and daughter at the bedside.  They are interested in making him a DO NOT RESUSCITATE and comfort measures and are interested in hospice at this time.  Patient has told him that he is tired.  He is waiting for God to take him.  Objective: Vitals:   12/22/19 0335 12/22/19 0630  BP:  (!) 106/52  Pulse: 84 89  Resp:  20  Temp:  97.8 F (36.6 C)  SpO2:  91%    Intake/Output Summary (Last 24 hours) at 12/22/2019 1029 Last data filed at 12/22/2019 0630 Gross per 24 hour  Intake 77.91 ml  Output 1275 ml  Net -1197.09 ml   Filed Weights   12/19/19 1100 12/19/19 1101 12/20/19 0247  Weight: 76 kg 76.4 kg 74.1 kg    ROS: Review of Systems  Unable to perform ROS: Acuity of condition  Respiratory: Negative for shortness of breath.   Cardiovascular: Negative for chest pain.  Gastrointestinal: Positive for abdominal pain.  Musculoskeletal: Positive for back pain and joint pain.   Exam: Physical Exam  Constitutional: He appears lethargic.  HENT:  Nose: No mucosal edema.  Cardiovascular: S1 normal and S2 normal. Exam reveals no gallop.  No murmur heard. Respiratory: No respiratory distress. He has decreased breath sounds in the right lower field and the left lower field. He has no wheezes. He has no rhonchi. He has no rales.  GI: Soft. There is abdominal tenderness.  Musculoskeletal:     Right ankle: Swelling present.     Left ankle: Swelling present.  Lymphadenopathy:    He has no cervical adenopathy.  Neurological: He appears lethargic.  Skin: Skin is warm.  As per surgeon stage IV decubiti buttock Right heel erythema which I would classify as stage I Left heel stage II decubiti  Psychiatric:  lethargic       Data Reviewed: Basic Metabolic Panel: Recent Labs  Lab 12/19/19 1104 12/20/19 0439 12/21/19 0612 12/22/19 0522  NA 138 141 144  --   K 2.6* 4.4 4.5  --   CL 95* 106 111  --   CO2 28 26 24   --   GLUCOSE 129* 101* 105*  --   BUN 52* 45* 42*  --   CREATININE 2.12* 1.51* 1.37* 1.18  CALCIUM 8.3* 8.1* 8.0*  --   MG 1.8  --   --   --    Liver Function Tests: Recent Labs  Lab 12/19/19 1104  AST 31  ALT 24  ALKPHOS 58  BILITOT 1.2  PROT 7.0  ALBUMIN 2.1*   CBC: Recent Labs  Lab 12/19/19 1104 12/20/19 0439 12/21/19 0612 12/22/19 0705  WBC 25.0* 16.1* 21.5* 16.3*  NEUTROABS 20.6*  --   --   --   HGB 11.3* 10.3* 9.5* 8.0*  HCT 34.7* 31.8* 31.1* 23.9*  MCV 91.6 92.2 94.0 88.5  PLT 477* 367 391 265   BNP (last 3 results) Recent Labs    09/25/19 0027 09/26/19 0142 09/27/19 0326  BNP 174.6* 96.9 198.8*     Recent Results (from the past 240 hour(s))  Blood Culture (routine x 2)     Status: None (Preliminary result)   Collection Time: 12/19/19 11:05 AM   Specimen: BLOOD  Result  Value Ref Range Status   Specimen Description BLOOD RAC  Final   Special Requests   Final    BOTTLES DRAWN AEROBIC AND ANAEROBIC Blood Culture adequate volume   Culture   Final    NO GROWTH 3 DAYS Performed at [facility], [address] [place], [place] [address]    Report Status PENDING  Incomplete  Blood Culture (routine x 2)     Status: None (Preliminary result)   Collection Time: 12/19/19 11:52 AM   Specimen: BLOOD  Result Value Ref Range Status   Specimen Description BLOOD RIGHT ANTECUBITAL  Final   Special Requests   Final    BOTTLES DRAWN AEROBIC AND ANAEROBIC Blood Culture adequate volume   Culture   Final    NO GROWTH 3 DAYS Performed at [facility], [address], [place], [place] [address]    Report Status PENDING  Incomplete  Urine culture     Status: Abnormal   Collection Time: 12/19/19 12:17 PM   Specimen: In/Out Cath Urine  Result  Value Ref Range Status   Specimen Description   Final    IN/OUT CATH URINE Performed at [facility], [address], [place], [place] [address]    Special Requests   Final    NONE Performed at [facility], [address] [place],  [address]    Culture MULTIPLE SPECIES PRESENT, SUGGEST RECOLLECTION (A)  Final   Report Status 12/21/2019 FINAL  Final  Respiratory Panel by RT PCR (Flu A&B, Covid) - Nasopharyngeal Swab     Status: None   Collection Time: 12/19/19 12:27 PM   Specimen: Nasopharyngeal Swab  Result Value Ref Range Status   SARS Coronavirus 2 by RT PCR NEGATIVE NEGATIVE Final    Comment: (NOTE) SARS-CoV-2 target nucleic acids are NOT DETECTED. The SARS-CoV-2 RNA is generally detectable in upper respiratoy specimens during the acute phase of infection. The lowest concentration of SARS-CoV-2 viral copies this assay can detect is 131 copies/mL. A negative result does not preclude SARS-Cov-2 infection and should not be used as the sole basis for treatment or other patient management decisions. A negative result may occur with  improper specimen collection/handling, submission of specimen other than nasopharyngeal swab, presence of viral mutation(s) within the areas targeted by this assay, and inadequate number of viral copies (<131 copies/mL). A negative result must be combined with clinical observations, patient history, and epidemiological information. The expected result is Negative. Fact Sheet for Patients:  PinkCheek.be Fact Sheet for Healthcare Providers:  GravelBags.it This test is not yet ap proved or cleared by the [place] FDA and  has been authorized for detection and/or diagnosis of SARS-CoV-2 by FDA under an Emergency Use Authorization (EUA). This EUA will remain  in effect (meaning this test can be used) for the duration of the COVID-19 declaration under Section 564(b)(1)  of the Act, 21 U.S.C. section 360bbb-3(b)(1), unless the authorization is terminated or revoked sooner.    Influenza A by PCR NEGATIVE NEGATIVE Final   Influenza B by PCR NEGATIVE NEGATIVE Final    Comment: (NOTE) The Xpert Xpress SARS-CoV-2/FLU/RSV assay is intended as an aid in  the diagnosis of influenza from Nasopharyngeal swab specimens and  should not be used as a sole basis for treatment. Nasal washings and  aspirates are unacceptable for Xpert Xpress SARS-CoV-2/FLU/RSV  testing. Fact Sheet for Patients: PinkCheek.be Fact Sheet for Healthcare Providers: GravelBags.it This test is not yet approved or cleared by the [place] FDA  and  has been authorized for detection and/or diagnosis of SARS-CoV-2 by  FDA under an Emergency Use Authorization (EUA). This EUA will remain  in effect (meaning this test can be used) for the duration of the  Covid-19 declaration under Section 564(b)(1) of the Act, 21  U.S.C. section 360bbb-3(b)(1), unless the authorization is  terminated or revoked. Performed at [facility], [address], [place], [place] [address]   Surgical PCR screen     Status: Abnormal   Collection Time: 12/20/19  6:51 AM   Specimen: Nasal Mucosa; Nasal Swab  Result Value Ref Range Status   MRSA, PCR POSITIVE (A) NEGATIVE Final    Comment: RESULT CALLED TO, READ BACK BY AND VERIFIED WITH: JODIE SMITH AT 9678 12/20/19.PMF    Staphylococcus aureus POSITIVE (A) NEGATIVE Final    Comment: (NOTE) The Xpert SA Assay (FDA approved for NASAL specimens in patients 72 years of age and older), is one component of a comprehensive surveillance program. It is not intended to diagnose infection nor to guide or monitor treatment. Performed at [facility], [address] [place], [place] [address]   Aerobic/Anaerobic Culture (surgical/deep wound)     Status: None (Preliminary result)   Collection  Time: 12/20/19 10:50 AM   Specimen: PATH Other; Tissue  Result Value Ref Range Status   Specimen Description   Final    TISSUE Performed at [facility], [address] [place], [place] [address]    Special Requests SACRAL WOUND TISSUE  Final   Gram Stain   Final    ABUNDANT WBC PRESENT,BOTH PMN AND MONONUCLEAR MODERATE GRAM POSITIVE COCCI IN PAIRS RARE GRAM NEGATIVE RODS    Culture   Final    ABUNDANT PSEUDOMONAS AERUGINOSA ABUNDANT ENTEROCOCCUS FAECALIS SUSCEPTIBILITIES TO FOLLOW Performed at [facility], [place] [address], [place], [place] [address]    Report Status PENDING  Incomplete   Organism ID, Bacteria PSEUDOMONAS AERUGINOSA  Final      Susceptibility   Pseudomonas aeruginosa - MIC*    CEFTAZIDIME 16 INTERMEDIATE Intermediate     CIPROFLOXACIN <=0.25 SENSITIVE Sensitive     GENTAMICIN <=1 SENSITIVE Sensitive     IMIPENEM 2 SENSITIVE Sensitive     * ABUNDANT PSEUDOMONAS AERUGINOSA  Aerobic/Anaerobic Culture (surgical/deep wound)     Status: None (Preliminary result)   Collection Time: 12/20/19 10:50 AM   Specimen: PATH Bone biopsy; Tissue  Result Value Ref Range Status   Specimen Description   Final    BONE Performed at [facility], [address] [place], [place] [address]    Special Requests   Final    BIOPSY Performed at [facility], [address] [place], [place] [address]    Gram Stain   Final    RARE WBC PRESENT, PREDOMINANTLY PMN NO ORGANISMS SEEN Performed at [facility], [place] [address], [place],  [address]    Culture RARE ENTEROCOCCUS FAECALIS  Final   Report Status PENDING  Incomplete     Scheduled Meds: . collagenase   Topical Daily  . feeding supplement (ENSURE ENLIVE)  237 mL Oral TID BM  . finasteride  5 mg Oral Daily  . lidocaine  1 patch Transdermal Daily  . tamsulosin  0.4 mg Oral Daily   Continuous Infusions: . sodium chloride Stopped (12/21/19 0956)  . morphine       Assessment/Plan:  1. Infected decubitus ulcer stage IV.  Present on admission patient taken to the operating room  for debridement 4/18 by Dr. Hampton Abbot.  Family interested now in comfort care measures.  I will stop antibiotics and start morphine drip.  Comfort care measures.  Patient was made a DO NOT RESUSCITATE. 2. Acute kidney injury.  Creatinine improved from 2.12 down to 1.37. 3. Hypokalemia improved from 2.6 up to 4.5. 4. Essential hypertension.  Stop antihypertensives 5. Paroxysmal atrial fibrillation.  Stop amiodarone 6. Bilateral heel decubiti.  Present on admission.  Local wound care. 7. BPH with incomplete bladder emptying.  Continue Flomax and Proscar if able to take 8. GERD.  Stop Protonix 9. Hyperlipidemia.  Stop Zocor  Code Status:     Code Status Orders  (From admission, onward)         Start     Ordered   12/19/19 1213  Full code  Continuous     12/19/19 1217        Code Status History    Date Active Date Inactive Code Status Order ID Comments User Context   09/23/2019 1228 09/27/2019 1828 Full Code [id number]  Thurnell Lose, MD Inpatient   09/22/2019 1738 09/23/2019 1114 Full Code [id number]  Wyvonnia Dusky, MD ED   09/04/2019 0454 09/09/2019 0235 Full Code [id number]  Bradly Bienenstock, NP ED   08/11/2019 0735 08/16/2019 2033 Full Code [id number]  Ivor Costa, MD ED   10/10/2015 1524 10/12/2015 1821 Full Code [id number]  Hower, Aaron Mose, MD ED   Advance Care Planning Activity     Family Communication: Spoke with son Joel Wilson and daughter at the bedside. Disposition Plan: Family interested in comfort care measures at this point in time.  We will stop antibiotics and start morphine drip and comfort care measures.  Patient made a DO NOT RESUSCITATE.  Family also interested in hospice we will put in a hospice referral.  Consultants:  General surgery  Infectious disease  Procedures:  Debridement of sacral decubiti  Antibiotics:  Stopped  Time spent: 28  minutes   [place]

## 2019-12-22 NOTE — TOC Progression Note (Signed)
Transition of Care (TOC) - Progression Note    Patient Details  Name: Joel Wilson. MRN: [id number] Date of Birth: April 22, 1941  Transition of Care [facility]) CM/SW Contact  Beverly Sessions, RN Phone Number: 12/22/2019, 1:36 PM  Clinical Narrative:     Palliative has met with family and patient has been made full comfort care.   Son and daughter at bedside. Confirmed they wished to proceed with Hospice home in [place].    Referral has been made to [facility] with Manufacturing engineer   Expected Discharge Plan: Long Term Nursing Home Barriers to Discharge: No Barriers Identified  Expected Discharge Plan and Services Expected Discharge Plan: Long Term Nursing Home In-house Referral: Clinical Social Work                                             Social Determinants of Health (SDOH) Interventions    Readmission Risk Interventions Readmission Risk Prevention Plan 09/27/2019 09/08/2019 09/06/2019  Transportation Screening Complete Complete Complete  PCP or Specialist Appt within 3-5 Days Complete (No Data) -  [place] or Home Care Consult Complete - Patient refused  Social Work Consult for [place] Planning/Counseling Complete - Complete  Palliative Care Screening Not Applicable - Not Applicable  Medication Review Press photographer) Complete Complete Complete  Some recent data might be hidden

## 2019-12-22 NOTE — Care Management Important Message (Signed)
Important Message  Patient Details  Name: Joel Wilson. MRN: [id number] Date of Birth: [birth year]   Medicare Important Message Given:  Yes     Dannette Barbara 12/22/2019, 11:11 AM

## 2019-12-22 NOTE — Consult Note (Signed)
Consultation Note Date: 12/22/2019   Patient Name: Joel Wilson.  DOB: October 03, 1940  MRN: [id number]  Age / Sex: 79 y.o., male  PCP: [facility] Referring Physician: Loletha Grayer, MD  Reason for Consultation: Establishing goals of care  HPI/Patient Profile: 79 y.o. male  with past medical history of HTN, a fib, BPH, CAD, peripheral neuropathy, HLD, and chronic pain admitted on 12/19/2019 with pain all over.  Found to have infected sacral decubitus that was debrided in OR with wound vac placement. Also with AKI. PMT consulted for [place].  Clinical Assessment and Goals of Care: I have reviewed medical records including EPIC notes, labs and imaging, received report from RN, assessed the patient and then met with son and daughter at bedside  to discuss diagnosis prognosis, GOC, EOL wishes, disposition and options.  Received update from RN - patient is mostly sleeping only - will wake up and take a few sips/bites if coerced. He complains of pain every time he is awake. She has initiated a morphine infusion.  I introduced Palliative Medicine as specialized medical care for people living with serious illness. It focuses on providing relief from the symptoms and stress of a serious illness. The goal is to improve quality of life for both the patient and the family.  We discussed a brief life review of the patient. They tell me at patient's different stay at facilities recently. They tell me since December 2020 he has been essentially bed bound with worsening quality of life.   They tell me about patient telling them multiple times over the past few days that he was ready to die.   They have already discussed care with Dr. Leslye Peer this morning and requested full comfort care. We discussed this as well. Discussed measures used to ensure comfort. Discussed transfer to hospice home - they request [facility] home.    Questions and concerns  were addressed. The family was encouraged to call with questions or concerns.   Primary Decision Maker HCPOA - Son Quincey Quesinberry    SUMMARY OF RECOMMENDATIONS   - full comfort care: d/c'd additional measures not needed to ensure comfort, added PRN morphine boluses - family requests patient not be woken - will d/c PO meds - per nurse likely unable to take - add foley for EOL care - discussed transfer to hospice home with case manager and hospice liaison - signed DNR placed on chart  Code Status/Advance Care Planning:  DNR  Additional Recommendations (Limitations, Scope, Preferences):  Full Comfort Care  Psycho-social/Spiritual:   Desire for further Chaplaincy support:no  Additional Recommendations: Education on Hospice  Prognosis:   < 2 weeks  Discharge Planning: Hospice facility      Primary Diagnoses: Present on Admission: . Wound infection . AKI (acute kidney injury) ([place]) . Atrial fibrillation ([place]) . Hypokalemia . BPH (benign prostatic hyperplasia)   I have reviewed the medical record, interviewed the patient and family, and examined the patient. The following aspects are pertinent.  Past Medical History:  Diagnosis Date  . 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 Coronary artery disease    a. 2003 PCI to Ramus;  b. 11/2012 Cath: LM nl, LAD 30, LCX nl, RI 40 ISR, RCA nl.  . Hereditary and idiopathic peripheral neuropathy   . Hyperlipidemia   . Hypertension   . Hypertensive heart disease    a. 08/2013 Echo: EF 70-75%, mild LVH.  Marland Kitchen Insomnia   . Paroxysmal atrial fibrillation (H[facility])    a.08/2013-->amio;  b. CHA2DS2VASc = 4-->no longer on coumadin.  . Vitamin D deficiency    Social History   Socioeconomic History  . Marital status: Divorced    Spouse name: Not on file  . Number of children: 4  . Years of education: Not on file  . Highest education level: Not on file  Occupational History  . Not on file  Tobacco Use  . Smoking status:  Former Smoker    Packs/day: 0.50    Years: 4.00    Pack years: 2.00  . Smokeless tobacco: Never Used  Substance and Sexual Activity  . Alcohol use: No  . Drug use: No  . Sexual activity: Not on file  Other Topics Concern  . Not on file  Social History Narrative  . Not on file   Social Determinants of Health   Financial Resource Strain:   . Difficulty of Paying Living Expenses:   Food Insecurity:   . Worried About Charity fundraiser in the Last Year:   . Arboriculturist in the Last Year:   Transportation Needs:   . Film/video editor (Medical):   Marland Kitchen Lack of Transportation (Non-Medical):   Physical Activity:   . Days of Exercise per Week:   . Minutes of Exercise per Session:   Stress:   . Feeling of Stress :   Social Connections:   . Frequency of Communication with Friends and Family:   . Frequency of Social Gatherings with Friends and Family:   . Attends Religious Services:   . Active Member of Clubs or Organizations:   . Attends Archivist Meetings:   Marland Kitchen Marital Status:    Family History  Problem Relation Age of Onset  . Diabetes Neg Hx    Scheduled Meds: . antiseptic oral rinse  15 mL Topical BID  . collagenase   Topical Daily  . lidocaine  1 patch Transdermal Daily   Continuous Infusions: . sodium chloride Stopped (12/21/19 0956)  . morphine 4 mg/hr (12/22/19 1056)   PRN Meds:.sodium chloride, acetaminophen, glycopyrrolate **OR** glycopyrrolate **OR** glycopyrrolate, haloperidol **OR** haloperidol **OR** haloperidol lactate, morphine, [DISCONTINUED] ondansetron **OR** ondansetron (ZOFRAN) IV, polyvinyl alcohol Allergies  Allergen Reactions  . Aspirin    Review of Systems  Unable to perform ROS: Other    Physical Exam Constitutional:      General: He is not in acute distress.    Comments: Sleeping - family request to not wake as they have opted for comfort care and he is in pain when awake, pale, thin  Pulmonary:     Effort: Pulmonary  effort is normal. No respiratory distress.  Abdominal:     Palpations: Abdomen is soft.  Musculoskeletal:     Comments: Edematous extremities  Skin:    General: Skin is warm and dry.     Vital Signs: BP (!) 106/52 (BP Location: Left Wrist)   Pulse 89   Temp 97.8 F (36.6 C) (Oral)   Resp 20   Ht '5\' 11"'$  (1.803 m)   Wt 74.1 kg   SpO2 91%   BMI 22.78 kg/m  Pain Scale: PAINAD POSS *See Group Information*: 1-Acceptable,Awake and alert Pain Score: 0-No pain   SpO2: SpO2: 91 % O2 Device:SpO2: 91 % O2 Flow Rate: .O2 Flow Rate (L/min): 0 L/min  IO: Intake/output summary:   Intake/Output Summary (Last 24 hours) at 12/22/2019 1149 Last data filed at 12/22/2019 0630 Gross per 24 hour  Intake 77.91 ml  Output 1275 ml  Net -1197.09  ml    LBM: Last BM Date: (unsure) Baseline Weight: Weight: 76 kg Most recent weight: Weight: 74.1 kg     Palliative Assessment/Data: PPS 20%    Time Total: 35 minutes Greater than 50%  of this time was spent counseling and coordinating care related to the above assessment and plan.  Juel Burrow, DNP, AGNP-C Palliative Medicine Team [phone] Pager: [phone]

## 2019-12-22 NOTE — Progress Notes (Signed)
Manufacturing engineer [facility]) Hospital Liaison RN note  Received request from [name], RN with [facility] team and Villa Herb, NP with PMT for family interest in [place]. Chart reviewed and spoke with son Stormy Card by phone to acknowledge referral. Unfortunately, Hospice Home is not able to offer a room today. Family and TOC team aware that [facility] liaison will follow up tomorrow or sooner if a room becomes available.  Please call with any hospice related questions or concerns.  Thank you. Margaretmary Eddy, BSN, RN [facility] Liaison [phone]

## 2019-12-22 NOTE — Progress Notes (Signed)
[place] Hospital Day(s): 3.   Post op day(s): 2 Days Post-Op.   Interval History:  Patient seen and examined, no acute events or new complaints overnight.  Patient somewhat agitated/upset, he continually cries out "that Jesus is coming."  Leukocytosis improved to 16K; no fevers sCr normalized to 1.18, UO - 1.2 Wound vac to sacral wound; plan for change today   Vital signs in last 24 hours: [min-max] current  Temp:  [97.5 F (36.4 C)-97.8 F (36.6 C)] 97.8 F (36.6 C) (04/20 0630) Pulse Rate:  [84-110] 89 (04/20 0630) Resp:  [15-20] 20 (04/20 0630) BP: (93-131)/(52-107) 106/52 (04/20 0630) SpO2:  [91 %-100 %] 91 % (04/20 0630)     Height: 5\' 11"  (180.3 cm) Weight: 74.1 kg BMI (Calculated): 22.79   Intake/Output last 2 shifts:  04/19 0701 - 04/20 0700 In: 197.9 [P.O.:120; I.V.:29.4; IV Piggyback:48.5] Out: 7062 [Urine:1200; Drains:75]   Physical Exam:  Constitutional: alert, cooperative and no distress  Respiratory: breathing non-labored at rest  Cardiovascular: regular rate and sinus rhythm  Integumentary: Large sacral decubitus ulcerative, there is undermining laterally on both sides and superiorly, there is healthy granulation tissue, no necrosis or purulence.    Sacral Wound (12/22/2019):       Labs:  CBC Latest Ref Rng & Units 12/22/2019 12/21/2019 12/20/2019  WBC 4.0 - 10.5 K/uL 16.3(H) 21.5(H) 16.1(H)  Hemoglobin 13.0 - 17.0 g/dL 8.0(L) 9.5(L) 10.3(L)  Hematocrit 39.0 - 52.0 % 23.9(L) 31.1(L) 31.8(L)  Platelets 150 - 400 K/uL 265 391 367   CMP Latest Ref Rng & Units 12/22/2019 12/21/2019 12/20/2019  Glucose 70 - 99 mg/dL - 105(H) 101(H)  BUN 8 - 23 mg/dL - 42(H) 45(H)  Creatinine 0.61 - 1.24 mg/dL 1.18 1.37(H) 1.51(H)  Sodium 135 - 145 mmol/L - 144 141  Potassium 3.5 - 5.1 mmol/L - 4.5 4.4  Chloride 98 - 111 mmol/L - 111 106  CO2 22 - 32 mmol/L - 24 26  Calcium 8.9 - 10.3 mg/dL - 8.0(L) 8.1(L)  Total Protein 6.5  - 8.1 g/dL - - -  Total Bilirubin 0.3 - 1.2 mg/dL - - -  Alkaline Phos 38 - 126 U/L - - -  AST 15 - 41 U/L - - -  ALT 0 - 44 U/L - - -     Imaging studies: No new pertinent imaging studies   Assessment/Plan: 79 y.o. male 2 Days Post-Op s/p excisional debridement, sacral bone biopsy, and placement of wound vac for infected stage IV sacral decubitus ulcer, complicated by pertinent comorbidities including wheelchair bound state.   - Okay to resume diet             - Continue IV Abx (switched to Zosyn only 12/21/2019); follow up Cx; ID following             - Wound vac changed at bedside this morning; plan for next change on Friday with WOC RN  - No further debridement indicated  - Unsure if he has been evaluated by palliative care but this may be reasonable to help family establish GOC             - Pain control prn              - further management per primary team   - No further surgical needs indentified, we will sign off, will be available to assist in vac change Friday, please call with questions or concerns  All of the above  findings and recommendations were discussed with the medical team.  -- Edison Simon, PA-C [facility] 12/22/2019, 7:30 AM [phone] M-F: 7am - 4pm

## 2019-12-23 LAB — AEROBIC/ANAEROBIC CULTURE W GRAM STAIN (SURGICAL/DEEP WOUND)

## 2019-12-23 LAB — URINE CULTURE
Culture: >=100000 — AB
Special Requests: NORMAL

## 2019-12-23 NOTE — Discharge Summary (Signed)
[facility] NAME: Joel Wilson    MR#:  [id number]  DATE OF BIRTH:  06-Dec-1940  DATE OF ADMISSION:  12/19/2019 ADMITTING PHYSICIAN: Collier Bullock, MD  DATE OF DISCHARGE: 12/23/2019  PRIMARY CARE PHYSICIAN: [facility]    ADMISSION DIAGNOSIS:  DVT (deep venous thrombosis) ([place]) [I82.409] Wound infection [T14.8XXA, L08.9]  DISCHARGE DIAGNOSIS:  infected decubitus also stage IV status post debridement-- now comfort care acute renal failure Hypokalemia history of paroxysmal atrial fibrillation  SECONDARY DIAGNOSIS:   Past Medical History:  Diagnosis Date  . 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 Coronary artery disease    a. 2003 PCI to Ramus;  b. 11/2012 Cath: LM nl, LAD 30, LCX nl, RI 40 ISR, RCA nl.  . Hereditary and idiopathic peripheral neuropathy   . Hyperlipidemia   . Hypertension   . Hypertensive heart disease    a. 08/2013 Echo: EF 70-75%, mild LVH.  Marland Kitchen Insomnia   . Paroxysmal atrial fibrillation (HCC)    a.08/2013-->amio;  b. CHA2DS2VASc = 4-->no longer on coumadin.  . Vitamin D deficiency     HOSPITAL COURSE:  79 y.o. male  with past medical history of HTN, a fib, BPH with chronic indwelling foley catheter, CAD, peripheral neuropathy, HLD, and chronic pain admitted on 12/19/2019 with pain all over.According to EMS they were called out to the patient's nursing facility because patient has been crying out in pain, he has chronic pain secondary to a known sacral decubitus ulcer as well as lower extremity pressure injuries POA.  1. Infected decubitus ulcer stage IV.  Present on admission patient taken to the operating room for debridement 4/18 by Dr. Hampton Abbot.  Family interested in comfort care measures. Cont IV morphine drip.  Comfort care measures.  Patient was made a DO NOT RESUSCITATE. Poor prognosis given co-morbidities 2. Acute kidney injury.  Creatinine improved from 2.12 down to  1.37. 3. Hypokalemia improved from 2.6 up to 4.5. 4. Essential hypertension.  Stop antihypertensives 5. Paroxysmal atrial fibrillation.  Stop amiodarone 6. Bilateral heel decubiti.  Present on admission.  Local wound care. 7. BPH with incomplete bladder emptying/chronic indwelling foley catheter 8. GERD.  Stop Protonix 9. Hyperlipidemia.  Stop Zocor  Met with dter in the room. Pt had uneventful night she feels "he is  Ready". Pt on morphine gtt. Will d/c to Hospice facility this am. Dter in agreement. CONSULTS OBTAINED:  Treatment Team:  Tsosie Billing, MD  DRUG ALLERGIES:   Allergies  Allergen Reactions  . Aspirin     DISCHARGE MEDICATIONS:   Allergies as of 12/23/2019      Reactions   Aspirin       Medication List    STOP taking these medications   albuterol 108 (90 Base) MCG/ACT inhaler Commonly known as: VENTOLIN HFA   amiodarone 200 MG tablet Commonly known as: PACERONE   bisacodyl 5 MG EC tablet Commonly known as: DULCOLAX   clopidogrel 75 MG tablet Commonly known as: PLAVIX   diphenhydrAMINE 25 mg capsule Commonly known as: BENADRYL   docusate sodium 100 MG capsule Commonly known as: COLACE   doxycycline 100 MG capsule Commonly known as: VIBRAMYCIN   feeding supplement (ENSURE ENLIVE) Liqd   finasteride 5 MG tablet Commonly known as: PROSCAR   gabapentin 300 MG capsule Commonly known as: NEURONTIN   Mintox 817-711-65 MG/5ML suspension Generic drug: alum & mag hydroxide-simeth   mirtazapine 7.5 MG tablet Commonly known as: REMERON  multivitamin with minerals Tabs tablet   nitroGLYCERIN 0.4 MG SL tablet Commonly known as: NITROSTAT   ondansetron 4 MG tablet Commonly known as: ZOFRAN Replaced by: ondansetron 4 MG/2ML Soln injection   Oxycodone HCl 20 MG Tabs   pantoprazole 40 MG tablet Commonly known as: PROTONIX   polyethylene glycol 17 g packet Commonly known as: MIRALAX / GLYCOLAX   simvastatin 10 MG tablet Commonly  known as: ZOCOR   tamsulosin 0.4 MG Caps capsule Commonly known as: FLOMAX     TAKE these medications   acetaminophen 325 MG tablet Commonly known as: TYLENOL Take 650 mg by mouth every 6 (six) hours as needed for mild pain or fever.   antiseptic oral rinse Liqd Apply 15 mLs topically 2 (two) times daily.   collagenase ointment Commonly known as: SANTYL Apply topically daily. What changed:   how much to take  when to take this  additional instructions   glycopyrrolate 0.2 MG/ML injection Commonly known as: ROBINUL Inject 1 mL (0.2 mg total) into the vein every 4 (four) hours as needed (excessive secretions).   haloperidol lactate 5 MG/ML injection Commonly known as: HALDOL Inject 0.1 mLs (0.5 mg total) into the vein every 4 (four) hours as needed (or delirium).   Lidocaine 4 % Ptch Place 1 patch onto the skin daily. (apply to right shoulder)   morphine 1 mg/mL Soln infusion Inject 4 mg/hr into the vein continuous.   morphine 5 mg/mL Soln Inject 4 mg into the vein every 30 (thirty) minutes as needed (dyspnea).   ondansetron 4 MG/2ML Soln injection Commonly known as: ZOFRAN Inject 2 mLs (4 mg total) into the vein every 6 (six) hours as needed for nausea. Replaces: ondansetron 4 MG tablet   polyvinyl alcohol 1.4 % ophthalmic solution Commonly known as: LIQUIFILM TEARS Place 1 drop into both eyes 4 (four) times daily as needed for dry eyes.       If you experience worsening of your admission symptoms, develop shortness of breath, life threatening emergency, suicidal or homicidal thoughts you must seek medical attention immediately by calling 911 or calling your MD immediately  if symptoms less severe.  You Must read complete instructions/literature along with all the possible adverse reactions/side effects for all the Medicines you take and that have been prescribed to you. Take any new Medicines after you have completely understood and accept all the possible  adverse reactions/side effects.   Please note  You were cared for by a hospitalist during your hospital stay. If you have any questions about your discharge medications or the care you received while you were in the hospital after you are discharged, you can call the unit and asked to speak with the hospitalist on call if the hospitalist that took care of you is not available. Once you are discharged, your primary care physician will handle any further medical issues. Please note that NO REFILLS for any discharge medications will be authorized once you are discharged, as it is imperative that you return to your primary care physician (or establish a relationship with a primary care physician if you do not have one) for your aftercare needs so that they can reassess your need for medications and monitor your lab values. Today   SUBJECTIVE   Resting quietly. dter at bedside. On Morphine gtt  VITAL SIGNS:  Blood pressure (!) 106/52, pulse 89, temperature 97.8 F (36.6 C), temperature source Oral, resp. rate 20, height 5' 11" (1.803 m), weight 74.1 kg, SpO2 91 %.  I/O:    Intake/Output Summary (Last 24 hours) at 12/23/2019 0751 Last data filed at 12/23/2019 0325 Gross per 24 hour  Intake 65.85 ml  Output 500 ml  Net -434.15 ml    PHYSICAL EXAMINATION:  GENERAL:  79 y.o.-year-old patient lying in the bed with no acute distress. Chronically ill  LUNGS: Normal breath sounds bilaterally, no wheezing, rales,rhonchi or crepitation. No use of accessory muscles of respiration.  CARDIOVASCULAR: S1, S2 normal. No murmurs, rubs, or gallops.  ABDOMEN: Soft, non-tender, non-distended.  SKIN:as below Pressure Injury 08/12/19 Buttocks Left Stage III -  Full thickness tissue loss. Subcutaneous fat may be visible but bone, tendon or muscle are NOT exposed. (Active)  08/12/19 0008  Location: Buttocks  Location Orientation: Left  Staging: Stage III -  Full thickness tissue loss. Subcutaneous fat may be  visible but bone, tendon or muscle are NOT exposed.  Wound Description (Comments):   Present on Admission: Yes     Pressure Injury 08/12/19 Buttocks Right Stage III -  Full thickness tissue loss. Subcutaneous fat may be visible but bone, tendon or muscle are NOT exposed. (Active)  08/12/19 0009  Location: Buttocks  Location Orientation: Right  Staging: Stage III -  Full thickness tissue loss. Subcutaneous fat may be visible but bone, tendon or muscle are NOT exposed.  Wound Description (Comments):   Present on Admission: Yes     Pressure Injury 09/23/19 Heel Right Unstageable - Full thickness tissue loss in which the base of the ulcer is covered by slough (yellow, tan, gray, green or brown) and/or eschar (tan, brown or black) in the wound bed. both sides of heel (Active)  09/23/19 2140  Location: Heel  Location Orientation: Right  Staging: Unstageable - Full thickness tissue loss in which the base of the ulcer is covered by slough (yellow, tan, gray, green or brown) and/or eschar (tan, brown or black) in the wound bed.  Wound Description (Comments): both sides of heel  Present on Admission: Yes     Pressure Injury 09/04/19 Heel Left two spots, outside black and open, inside red intact (Active)  09/04/19 0815  Location: Heel  Location Orientation: Left  Staging:   Wound Description (Comments): two spots, outside black and open, inside red intact  Present on Admission: Yes     Pressure Injury Thigh Left;Posterior;Proximal Stage 2 -  Partial thickness loss of dermis presenting as a shallow open injury with a red, pink wound bed without slough. red and skin broken (Active)     Location: Thigh  Location Orientation: Left;Posterior;Proximal  Staging: Stage 2 -  Partial thickness loss of dermis presenting as a shallow open injury with a red, pink wound bed without slough.  Wound Description (Comments): red and skin broken  Present on Admission: Yes    DATA REVIEW:   CBC  Recent Labs   Lab 12/22/19 0705  WBC 16.3*  HGB 8.0*  HCT 23.9*  PLT 265    Chemistries  Recent Labs  Lab 12/19/19 1104 12/20/19 0439 12/21/19 0612 12/21/19 0612 12/22/19 0522  NA 138   < > 144  --   --   K 2.6*   < > 4.5  --   --   CL 95*   < > 111  --   --   CO2 28   < > 24  --   --   GLUCOSE 129*   < > 105*  --   --   BUN 52*   < > 42*  --   --  CREATININE 2.12*   < > 1.37*   < > 1.18  CALCIUM 8.3*   < > 8.0*  --   --   MG 1.8  --   --   --   --   AST 31  --   --   --   --   ALT 24  --   --   --   --   ALKPHOS 58  --   --   --   --   BILITOT 1.2  --   --   --   --    < > = values in this interval not displayed.    Microbiology Results   Recent Results (from the past 240 hour(s))  Blood Culture (routine x 2)     Status: None (Preliminary result)   Collection Time: 12/19/19 11:05 AM   Specimen: BLOOD  Result Value Ref Range Status   Specimen Description BLOOD RAC  Final   Special Requests   Final    BOTTLES DRAWN AEROBIC AND ANAEROBIC Blood Culture adequate volume   Culture   Final    NO GROWTH 4 DAYS Performed at [facility], [address] [place], [place] [address]    Report Status PENDING  Incomplete  Blood Culture (routine x 2)     Status: None (Preliminary result)   Collection Time: 12/19/19 11:52 AM   Specimen: BLOOD  Result Value Ref Range Status   Specimen Description BLOOD RIGHT ANTECUBITAL  Final   Special Requests   Final    BOTTLES DRAWN AEROBIC AND ANAEROBIC Blood Culture adequate volume   Culture   Final    NO GROWTH 4 DAYS Performed at [facility], [address] [place], [place] [address]    Report Status PENDING  Incomplete  Urine culture     Status: Abnormal   Collection Time: 12/19/19 12:17 PM   Specimen: In/Out Cath Urine  Result Value Ref Range Status   Specimen Description   Final    IN/OUT CATH URINE Performed at [facility], [address] [place], [place] [address]    Special Requests   Final     NONE Performed at [facility], [address] [place], [place] [address]    Culture MULTIPLE SPECIES PRESENT, SUGGEST RECOLLECTION (A)  Final   Report Status 12/21/2019 FINAL  Final  Respiratory Panel by RT PCR (Flu A&B, Covid) - Nasopharyngeal Swab     Status: None   Collection Time: 12/19/19 12:27 PM   Specimen: Nasopharyngeal Swab  Result Value Ref Range Status   SARS Coronavirus 2 by RT PCR NEGATIVE NEGATIVE Final    Comment: (NOTE) SARS-CoV-2 target nucleic acids are NOT DETECTED. The SARS-CoV-2 RNA is generally detectable in upper respiratoy specimens during the acute phase of infection. The lowest concentration of SARS-CoV-2 viral copies this assay can detect is 131 copies/mL. A negative result does not preclude SARS-Cov-2 infection and should not be used as the sole basis for treatment or other patient management decisions. A negative result may occur with  improper specimen collection/handling, submission of specimen other than nasopharyngeal swab, presence of viral mutation(s) within the areas targeted by this assay, and inadequate number of viral copies (<131 copies/mL). A negative result must be combined with clinical observations, patient history, and epidemiological information. The expected result is Negative. Fact Sheet for Patients:  PinkCheek.be Fact Sheet for Healthcare Providers:  GravelBags.it This test is not yet ap proved or cleared by the [place]  States FDA and  has been authorized for detection and/or diagnosis of SARS-CoV-2 by FDA under an Emergency Use Authorization (EUA). This EUA will remain  in effect (meaning this test can be used) for the duration of the COVID-19 declaration under Section 564(b)(1) of the Act, 21 U.S.C. section 360bbb-3(b)(1), unless the authorization is terminated or revoked sooner.    Influenza A by PCR NEGATIVE NEGATIVE Final   Influenza B by PCR NEGATIVE  NEGATIVE Final    Comment: (NOTE) The Xpert Xpress SARS-CoV-2/FLU/RSV assay is intended as an aid in  the diagnosis of influenza from Nasopharyngeal swab specimens and  should not be used as a sole basis for treatment. Nasal washings and  aspirates are unacceptable for Xpert Xpress SARS-CoV-2/FLU/RSV  testing. Fact Sheet for Patients: PinkCheek.be Fact Sheet for Healthcare Providers: GravelBags.it This test is not yet approved or cleared by the [place] FDA and  has been authorized for detection and/or diagnosis of SARS-CoV-2 by  FDA under an Emergency Use Authorization (EUA). This EUA will remain  in effect (meaning this test can be used) for the duration of the  Covid-19 declaration under Section 564(b)(1) of the Act, 21  U.S.C. section 360bbb-3(b)(1), unless the authorization is  terminated or revoked. Performed at [facility], [address], [place], [place] [address]   Surgical PCR screen     Status: Abnormal   Collection Time: 12/20/19  6:51 AM   Specimen: Nasal Mucosa; Nasal Swab  Result Value Ref Range Status   MRSA, PCR POSITIVE (A) NEGATIVE Final    Comment: RESULT CALLED TO, READ BACK BY AND VERIFIED WITH: JODIE SMITH AT 4742 12/20/19.PMF    Staphylococcus aureus POSITIVE (A) NEGATIVE Final    Comment: (NOTE) The Xpert SA Assay (FDA approved for NASAL specimens in patients 63 years of age and older), is one component of a comprehensive surveillance program. It is not intended to diagnose infection nor to guide or monitor treatment. Performed at [facility], [address], [place], [place] [address]   Aerobic/Anaerobic Culture (surgical/deep wound)     Status: None (Preliminary result)   Collection Time: 12/20/19 10:50 AM   Specimen: PATH Other; Tissue  Result Value Ref Range Status   Specimen Description   Final    TISSUE Performed at [facility], [address] [place], [place] [address]    Special Requests SACRAL WOUND TISSUE  Final   Gram Stain   Final    ABUNDANT WBC PRESENT,BOTH PMN AND MONONUCLEAR MODERATE GRAM POSITIVE COCCI IN PAIRS RARE GRAM NEGATIVE RODS    Culture   Final    ABUNDANT PSEUDOMONAS AERUGINOSA ABUNDANT ENTEROCOCCUS FAECALIS SUSCEPTIBILITIES TO FOLLOW Performed at [facility], [place] [address] [place], [place] [address]    Report Status PENDING  Incomplete   Organism ID, Bacteria PSEUDOMONAS AERUGINOSA  Final      Susceptibility   Pseudomonas aeruginosa - MIC*    CEFTAZIDIME 16 INTERMEDIATE Intermediate     CIPROFLOXACIN <=0.25 SENSITIVE Sensitive     GENTAMICIN <=1 SENSITIVE Sensitive     IMIPENEM 2 SENSITIVE Sensitive     * ABUNDANT PSEUDOMONAS AERUGINOSA  Aerobic/Anaerobic Culture (surgical/deep wound)     Status: None (Preliminary result)   Collection Time: 12/20/19 10:50 AM   Specimen: PATH Bone biopsy; Tissue  Result Value Ref Range Status   Specimen Description   Final    BONE Performed at [facility], [address] [place], [place] [address]    Special  Requests   Final    BIOPSY Performed at [facility], [address] [place], [place] [address]    Gram Stain   Final    RARE WBC PRESENT, PREDOMINANTLY PMN NO ORGANISMS SEEN    Culture   Final    RARE ENTEROCOCCUS FAECALIS RARE GRAM POSITIVE COCCI RARE PSEUDOMONAS AERUGINOSA SUSCEPTIBILITIES PERFORMED ON PREVIOUS CULTURE WITHIN THE LAST 5 DAYS. Performed at [facility], [place] [address], [place], [place] [address]    Report Status PENDING  Incomplete    RADIOLOGY:  No results found.   CODE STATUS:     Code Status Orders  (From admission, onward)         Start     Ordered   12/22/19 1027  Do not attempt resuscitation (DNR)  Continuous    Question Answer Comment  In the event of cardiac or respiratory ARREST Do not call a "code blue"   In the event of cardiac or respiratory ARREST Do not perform  Intubation, CPR, defibrillation or ACLS   In the event of cardiac or respiratory ARREST Use medication by any route, position, wound care, and other measures to relive pain and suffering. May use oxygen, suction and manual treatment of airway obstruction as needed for comfort.   Comments nurse may pronounce      12/22/19 1027        Code Status History    Date Active Date Inactive Code Status Order ID Comments User Context   12/19/2019 1218 12/22/2019 1027 Full Code [id number]  Collier Bullock, MD ED   09/23/2019 1228 09/27/2019 1828 Full Code [id number]  Thurnell Lose, MD Inpatient   09/22/2019 1738 09/23/2019 1114 Full Code [id number]  Wyvonnia Dusky, MD ED   09/04/2019 0454 09/09/2019 0235 Full Code [id number]  Bradly Bienenstock, NP ED   08/11/2019 0735 08/16/2019 2033 Full Code [id number]  Ivor Costa, MD ED   10/10/2015 1524 10/12/2015 1821 Full Code [id number]  Hower, Aaron Mose, MD ED   Advance Care Planning Activity       TOTAL TIME TAKING CARE OF THIS PATIENT: *40* minutes.    Fritzi Mandes M.D  [facility]    CC: Primary care physician; [facility]

## 2019-12-23 NOTE — Progress Notes (Signed)
Report called to [place] to [place].  Olivia Mackie also updated on floor and states they are ready for patient at hospice house.  Olivia Mackie to call for transport.  PIV RAC to be saline locked for transport and discharged with foley intact due to comfort care.  Family at bedside.

## 2019-12-23 NOTE — Progress Notes (Signed)
Patient to be discharged. Daughter at bedside and states HPOA aware and is meeting them at [place]. Patient wound vac removed and W/D dressing placed with foam to secure.  Patient repositioned.

## 2019-12-23 NOTE — TOC Transition Note (Signed)
Transition of Care [facility]) - CM/SW Discharge Note   Patient Details  Name: Joel Wilson. MRN: [id number] Date of Birth: 1941/04/29  [facility] of [facility] CM/SW Contact:  Beverly Sessions, RN Phone Number: 12/23/2019, 10:28 AM   Clinical Narrative:     Patient to discharge to Hospice home today Joel Wilson with [employer] will arrange EMS transport  Bedside RN to call report.   EMS packet and DNR on chart  Final next level of care: [place] Barriers to Discharge: No Barriers Identified   Patient Goals and CMS Choice        Discharge Placement                Patient to be transferred to facility by: EMS      Discharge Plan and Services In-house Referral: Clinical Social Work                                   Social Determinants of Health (SDOH) Interventions     Readmission Risk Interventions Readmission Risk Prevention Plan 12/23/2019 09/27/2019 09/08/2019  Transportation Screening Complete Complete Complete  PCP or Specialist Appt within 3-5 Days - Complete (No Data)  [place] or [place] - Complete -  Social Work Consult for [place] Planning/Counseling - Complete -  Palliative Care Screening - Not Applicable -  Medication Review Press photographer) Complete Complete Complete  PCP or Specialist appointment within 3-5 days of discharge (No Data) - -  [place] or [place] (No Data) - -  Palliative Care Screening Complete - -  [place] (No Data) - -  Some recent data might be hidden

## 2019-12-23 NOTE — Progress Notes (Addendum)
Manufacturing engineer [facility]) Hospital Liaison RN Note  Patient to transfer to the [facility] today. RN please call report to [phone].  Please fax completed discharge summary to [phone].  ADDENDUM: EMS called at 1101, he is next on the list for transport.  Please call with any hospice related questions or concerns.  Thank you.  Margaretmary Eddy, BSN, RN [facility] Liaison [phone]

## 2019-12-24 ENCOUNTER — Other Ambulatory Visit: Payer: Self-pay | Admitting: Infectious Diseases

## 2019-12-24 LAB — CULTURE, BLOOD (ROUTINE X 2)
Culture: NO GROWTH
Culture: NO GROWTH
Special Requests: ADEQUATE
Special Requests: ADEQUATE

## 2019-12-25 LAB — AEROBIC/ANAEROBIC CULTURE W GRAM STAIN (SURGICAL/DEEP WOUND)

## 2020-01-02 DEATH — deceased

## 2020-08-24 NOTE — Telephone Encounter (Signed)
Closing encounter
